# Patient Record
Sex: Female | Born: 1970 | Race: Black or African American | Hispanic: No | Marital: Single | Smoking: Never smoker
Health system: Southern US, Community
[De-identification: ages and names within clinical notes are randomized; demographics above are authoritative.]

## PROBLEM LIST (undated history)

## (undated) DIAGNOSIS — A411 Sepsis due to other specified staphylococcus: Secondary | ICD-10-CM

## (undated) DIAGNOSIS — K5900 Constipation, unspecified: Secondary | ICD-10-CM

## (undated) DIAGNOSIS — K219 Gastro-esophageal reflux disease without esophagitis: Secondary | ICD-10-CM

## (undated) DIAGNOSIS — F32A Depression, unspecified: Secondary | ICD-10-CM

## (undated) DIAGNOSIS — A8100 Creutzfeldt-Jakob disease, unspecified: Secondary | ICD-10-CM

## (undated) DIAGNOSIS — R130 Aphagia: Secondary | ICD-10-CM

## (undated) DIAGNOSIS — N390 Urinary tract infection, site not specified: Secondary | ICD-10-CM

## (undated) DIAGNOSIS — B952 Enterococcus as the cause of diseases classified elsewhere: Secondary | ICD-10-CM

## (undated) DIAGNOSIS — A419 Sepsis, unspecified organism: Secondary | ICD-10-CM

## (undated) DIAGNOSIS — R7881 Bacteremia: Secondary | ICD-10-CM

## (undated) DIAGNOSIS — J189 Pneumonia, unspecified organism: Secondary | ICD-10-CM

## (undated) DIAGNOSIS — F0390 Unspecified dementia without behavioral disturbance: Secondary | ICD-10-CM

## (undated) DIAGNOSIS — E538 Deficiency of other specified B group vitamins: Secondary | ICD-10-CM

## (undated) DIAGNOSIS — G2581 Restless legs syndrome: Secondary | ICD-10-CM

## (undated) DIAGNOSIS — F329 Major depressive disorder, single episode, unspecified: Secondary | ICD-10-CM

## (undated) HISTORY — PX: PEG TUBE PLACEMENT: SUR1034

## (undated) HISTORY — PX: CHOLECYSTECTOMY: SHX55

---

## 2013-02-07 ENCOUNTER — Encounter (HOSPITAL_COMMUNITY): Payer: Self-pay | Admitting: Emergency Medicine

## 2013-02-07 ENCOUNTER — Observation Stay (HOSPITAL_COMMUNITY)
Admission: EM | Admit: 2013-02-07 | Discharge: 2013-02-08 | Disposition: A | Discharge status: Skilled Nursing Facility | Payer: Medicare Other | Attending: Internal Medicine | Admitting: Internal Medicine

## 2013-02-07 DIAGNOSIS — F329 Major depressive disorder, single episode, unspecified: Secondary | ICD-10-CM | POA: Insufficient documentation

## 2013-02-07 DIAGNOSIS — G2581 Restless legs syndrome: Secondary | ICD-10-CM | POA: Insufficient documentation

## 2013-02-07 DIAGNOSIS — F3289 Other specified depressive episodes: Secondary | ICD-10-CM | POA: Insufficient documentation

## 2013-02-07 DIAGNOSIS — Z431 Encounter for attention to gastrostomy: Principal | ICD-10-CM | POA: Insufficient documentation

## 2013-02-07 DIAGNOSIS — F0280 Dementia in other diseases classified elsewhere without behavioral disturbance: Secondary | ICD-10-CM | POA: Diagnosis present

## 2013-02-07 DIAGNOSIS — R4789 Other speech disturbances: Secondary | ICD-10-CM

## 2013-02-07 DIAGNOSIS — A8100 Creutzfeldt-Jakob disease, unspecified: Secondary | ICD-10-CM | POA: Insufficient documentation

## 2013-02-07 DIAGNOSIS — F0390 Unspecified dementia without behavioral disturbance: Secondary | ICD-10-CM

## 2013-02-07 DIAGNOSIS — K219 Gastro-esophageal reflux disease without esophagitis: Secondary | ICD-10-CM | POA: Insufficient documentation

## 2013-02-07 DIAGNOSIS — K5900 Constipation, unspecified: Secondary | ICD-10-CM | POA: Diagnosis present

## 2013-02-07 DIAGNOSIS — N390 Urinary tract infection, site not specified: Secondary | ICD-10-CM | POA: Insufficient documentation

## 2013-02-07 DIAGNOSIS — R4702 Dysphasia: Secondary | ICD-10-CM | POA: Diagnosis present

## 2013-02-07 HISTORY — DX: Gastro-esophageal reflux disease without esophagitis: K21.9

## 2013-02-07 HISTORY — DX: Bacteremia: R78.81

## 2013-02-07 HISTORY — DX: Pneumonia, unspecified organism: J18.9

## 2013-02-07 HISTORY — DX: Restless legs syndrome: G25.81

## 2013-02-07 HISTORY — DX: Unspecified dementia, unspecified severity, without behavioral disturbance, psychotic disturbance, mood disturbance, and anxiety: F03.90

## 2013-02-07 HISTORY — DX: Aphagia: R13.0

## 2013-02-07 HISTORY — DX: Deficiency of other specified B group vitamins: E53.8

## 2013-02-07 HISTORY — DX: Major depressive disorder, single episode, unspecified: F32.9

## 2013-02-07 HISTORY — DX: Urinary tract infection, site not specified: N39.0

## 2013-02-07 HISTORY — DX: Constipation, unspecified: K59.00

## 2013-02-07 HISTORY — DX: Creutzfeldt-Jakob disease, unspecified: A81.00

## 2013-02-07 HISTORY — DX: Depression, unspecified: F32.A

## 2013-02-07 HISTORY — DX: Sepsis, unspecified organism: A41.9

## 2013-02-07 LAB — CBC
HCT: 38.7 % (ref 36.0–46.0)
Hemoglobin: 12.7 g/dL (ref 12.0–15.0)
MCH: 27.7 pg (ref 26.0–34.0)
MCV: 84.3 fL (ref 78.0–100.0)
RBC: 4.59 MIL/uL (ref 3.87–5.11)
RDW: 15.6 % — ABNORMAL HIGH (ref 11.5–15.5)
WBC: 8.9 10*3/uL (ref 4.0–10.5)

## 2013-02-07 LAB — BASIC METABOLIC PANEL
CO2: 27 mEq/L (ref 19–32)
Chloride: 99 mEq/L (ref 96–112)
Creatinine, Ser: 0.66 mg/dL (ref 0.50–1.10)
GFR calc Af Amer: >90 mL/min (ref >90)
GFR calc non Af Amer: >90 mL/min (ref >90)
Glucose, Bld: 103 mg/dL — ABNORMAL HIGH (ref 70–99)
Potassium: 3.8 mEq/L (ref 3.5–5.1)
Sodium: 137 mEq/L (ref 135–145)

## 2013-02-07 MED ORDER — SODIUM CHLORIDE 0.9 % IV SOLN
INTRAVENOUS | Status: AC
  Administered 2013-02-07: 07:00:00 via INTRAVENOUS

## 2013-02-07 MED ORDER — BISACODYL 10 MG RE SUPP: 10.0000 mg | RECTAL | Status: DC | PRN

## 2013-02-07 MED ORDER — ACETAMINOPHEN 650 MG RE SUPP: 650.0000 mg | Freq: Four times a day (QID) | RECTAL | Status: DC | PRN

## 2013-02-07 MED ORDER — ACETAMINOPHEN 325 MG PO TABS: 650.0000 mg | ORAL_TABLET | Freq: Four times a day (QID) | ORAL | Status: DC | PRN

## 2013-02-07 MED ORDER — ONDANSETRON HCL 4 MG/2ML IJ SOLN: 4.0000 mg | Freq: Four times a day (QID) | INTRAMUSCULAR | Status: DC | PRN

## 2013-02-07 MED ORDER — ONDANSETRON HCL 4 MG PO TABS: 4.0000 mg | ORAL_TABLET | Freq: Four times a day (QID) | ORAL | Status: DC | PRN

## 2013-02-07 MED ORDER — SODIUM CHLORIDE 0.9 % IV SOLN
INTRAVENOUS | Status: DC
  Administered 2013-02-07: 17:00:00 via INTRAVENOUS

## 2013-02-07 NOTE — Progress Notes (Signed)
Patient seen and examined today. Agree with assessment and plan. Contacted the interventional radiology for PEG tube replacement. Will have nurse place a Foley catheter so the site will remain patent. Will likely discharge on 02/08/2013 once PEG tube has been replaced.

## 2013-02-07 NOTE — ED Notes (Signed)
Feeding tube dislodged. The bulb was still inflated. No feeding happening at that time.  Coming from [employer]. BP 111/77, hr 84, rr 18

## 2013-02-07 NOTE — Progress Notes (Signed)
Patient ID: [name] [name], female   DOB: 03/01/1971, 42 y.o.   MRN: [id number]   Admitted to [facility] through ER last pm Gastric tube was dislodged while at nursing facility  G tube was placed in outside facility ?? When  Foley in place now in site. Will replace in am  Call to sister for consent--LM

## 2013-02-07 NOTE — ED Notes (Signed)
Skin dry and intact around peg tube site. No purulent drainage.

## 2013-02-07 NOTE — ED Notes (Signed)
Dr. Dierdre Highman attempted to place 20 fr. g-tube but unsuccessful.

## 2013-02-07 NOTE — ED Provider Notes (Signed)
CSN: [id number]     Arrival date & time 02/07/13  0221 History   First MD Initiated Contact with Patient 02/07/13 0335     Chief Complaint  Patient presents with  . GI Problem   (Consider location/radiation/quality/duration/timing/severity/associated sxs/prior Treatment) HPI History per EMS and nursing home report. History of Crutzfeldt-Jakob disease, is aphasic/ bed bound with PEG tube.  Nursing home reports that her 16 DF PEG tube was dislodged sometime today with balloon inflated.  She was sent here for further evaluation. Patient unable to provide any history but to chronic disease - level V caveat applies. Past Medical History  Diagnosis Date  . Dementia   . Restless leg syndrome   . Esophageal reflux   . UTI (urinary tract infection)   . Depression   . Pneumonia   . Constipation   . Aphagia   . Bacteremia   . Sepsis    Past Surgical History  Procedure Laterality Date  . Peg tube placement      16 French   No family history on file. History  Substance Use Topics  . Smoking status: Unknown If Ever Smoked  . Smokeless tobacco: Not on file  . Alcohol Use: No   OB History   Grav Para Term Preterm Abortions TAB SAB Ect Mult Living                 Review of Systems  Unable to perform ROS  level V caveat as above  Allergies  Sulfa antibiotics  Home Medications   Current Outpatient Rx  Name  Route  Sig  Dispense  Refill  . bisacodyl (DULCOLAX) 10 MG suppository   Rectal   Place 10 mg rectally every other day as needed for moderate constipation.         . cetirizine (ZYRTEC) 10 MG tablet   Tube   Give 10 mg by tube daily.         . meloxicam (MOBIC) 7.5 MG tablet   Oral   Take 7.5 mg by mouth daily.         . Nutritional Supplements (FEEDING SUPPLEMENT, JEVITY 1.5 CAL,) LIQD   Per Tube   Place 45 mL/hr into feeding tube continuous.         Marland Kitchen omeprazole (PRILOSEC) 2 mg/mL SUSP   Per Tube   Place 20 mg into feeding tube daily.         .  polyethylene glycol (MIRALAX / GLYCOLAX) packet   Tube   Give 17 g by tube daily.         Marland Kitchen rOPINIRole (REQUIP) 0.25 MG tablet   Tube   Give 0.25 mg by tube at bedtime.         . vitamin B-12 (CYANOCOBALAMIN) 100 MCG tablet   Tube   Give 100 mcg by tube daily.         . Water For Irrigation, Sterile (FREE WATER) SOLN   Per Tube   Place 150 mLs into feeding tube every 4 (four) hours.          BP 116/92  Pulse 88  Temp(Src) 99 F (37.2 C) (Rectal)  Resp 16  SpO2 97% Physical Exam  Constitutional: She appears well-developed and well-nourished.  HENT:  Head: Normocephalic and atraumatic.  Eyes: EOM are normal. Pupils are equal, round, and reactive to light.  Neck: Neck supple.  Cardiovascular: Normal rate, regular rhythm and intact distal pulses.   Pulmonary/Chest: Effort normal and breath sounds normal. No respiratory  distress.  Abdominal: Soft. She exhibits no distension.  G-tube site without active bleeding  Musculoskeletal: Normal range of motion. She exhibits no edema.  Neurological:  Awake, eyes open, does not follow commands, aphasic, contractures  Skin: Skin is warm and dry.    ED Course  Procedures (including critical care time) Labs Review Labs Reviewed  CBC - Abnormal; Notable for the following:    RDW 15.6 (*)    All other components within normal limits  BASIC METABOLIC PANEL   Medical supply does not have 16 French PEG available at this time  G-tube site evaluated and no obvious tract. I discussed with general surgery Dr. Derrell Lolling in best option at this point would be G-tube placement in the morning with interventional radiology. Medicine consulted for admit.   DR Adela Glimpse to admit MDM  Diagnosis: G-tube dislodged  Labs reviewed as above MED admit, plan IR PEG replacement   Sunnie Nielsen, MD 02/07/13 [phone]

## 2013-02-07 NOTE — H&P (Signed)
PCP:   [name], MD    Chief Complaint:  PEG tube fell out  HPI: Taylyn Brame is a 42 y.o. female   has a past medical history of Dementia; Restless leg syndrome; Esophageal reflux; UTI (urinary tract infection); Depression; Pneumonia; Constipation; Aphagia; Bacteremia; and Sepsis.   Presented with  Patient was brought in from nursing home after her PEG tube has become dislodged. 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 Unable to obtain any history from patient  Review of Systems:  Unable to obtain Past Medical History: Past Medical History  Diagnosis Date  . Dementia   . Restless leg syndrome   . Esophageal reflux   . UTI (urinary tract infection)   . Depression   . Pneumonia   . Constipation   . Aphagia   . Bacteremia   . Sepsis    Past Surgical History  Procedure Laterality Date  . Peg tube placement      16 French     Medications: Prior to Admission medications   Medication Sig Start Date End Date Taking? Authorizing Provider  bisacodyl (DULCOLAX) 10 MG suppository Place 10 mg rectally every other day as needed for moderate constipation.   Yes Historical Provider, MD  cetirizine (ZYRTEC) 10 MG tablet Give 10 mg by tube daily.   Yes Historical Provider, MD  meloxicam (MOBIC) 7.5 MG tablet Take 7.5 mg by mouth daily.   Yes Historical Provider, MD  Nutritional Supplements (FEEDING SUPPLEMENT, JEVITY 1.5 CAL,) LIQD Place 45 mL/hr into feeding tube continuous.   Yes Historical Provider, MD  omeprazole (PRILOSEC) 2 mg/mL SUSP Place 20 mg into feeding tube daily.   Yes Historical Provider, MD  polyethylene glycol (MIRALAX / GLYCOLAX) packet Give 17 g by tube daily.   Yes Historical Provider, MD  rOPINIRole (REQUIP) 0.25 MG tablet Give 0.25 mg by tube at bedtime.   Yes Historical Provider, MD  vitamin B-12 (CYANOCOBALAMIN) 100 MCG tablet Give 100 mcg by tube daily.   Yes Historical Provider, MD  Water  For Irrigation, Sterile (FREE WATER) SOLN Place 150 mLs into feeding tube every 4 (four) hours.   Yes Historical Provider, MD    Allergies:   Allergies  Allergen Reactions  . Sulfa Antibiotics Other (See Comments)    unknown    Social History:  Ambulatory bed bound Lives at [facility]   reports that she does not drink alcohol or use illicit drugs.   Family History: Family history is unknown by patient. patient is unable to provide secondary to dementia    Physical Exam: Patient Vitals for the past 24 hrs:  BP Temp Temp src Pulse Resp SpO2  02/07/13 0300 116/92 mmHg - - 88 - 97 %  02/07/13 0256 - 99 F (37.2 C) Rectal - 16 -  02/07/13 0237 114/76 mmHg - - 86 - 97 %    1. General:  in No Acute distress 2. Psychological: Somnolent not responding  to verbal stimuli moaning 3. Head/ENT:   Moist Mucous Membranes                          Head Non traumatic, neck supple                          Normal Dentition 4. SKIN: normal  Skin turgor,  Skin clean Dry and intact no rash 5. Heart:  Regular rate and rhythm no Murmur, Rub or gallop 6. Lungs: Clear to auscultation bilaterally, no wheezes or crackles   7. Abdomen: Soft, non-tender, Non distended PEG tube site visible appears to be well with extravasating material 8. Lower extremities: no clubbing, cyanosis, or edema 9. Neurologically Grossly intact, moving all 4 extremities equally 10. MSK: Normal range of motion  body mass index is unknown because there is no height or weight on file.   Labs on Admission:   Recent Labs  02/07/13 0400  NA 137  K 3.8  CL 99  CO2 27  GLUCOSE 103*  BUN 14  CREATININE 0.66  CALCIUM 9.3   No results found for this basename: AST, ALT, ALKPHOS, BILITOT, PROT, ALBUMIN,  in the last 72 hours No results found for this basename: LIPASE, AMYLASE,  in the last 72 hours  Recent Labs  02/07/13 0400  WBC 8.9  HGB 12.7  HCT 38.7  MCV 84.3  PLT 326   No results found for  this basename: CKTOTAL, CKMB, CKMBINDEX, TROPONINI,  in the last 72 hours No results found for this basename: TSH, T4TOTAL, FREET3, T3FREE, THYROIDAB,  in the last 72 hours No results found for this basename: VITAMINB12, FOLATE, FERRITIN, TIBC, IRON, RETICCTPCT,  in the last 72 hours No results found for this basename: HGBA1C    CrCl is unknown because there is no height on file for the current visit. ABG No results found for this basename: phart, pco2, po2, hco3, tco2, acidbasedef, o2sat     No results found for this basename: DDIMER     Cultures: No results found for this basename: sdes, specrequest, cult, reptstatus       Radiological Exams on Admission: No results found.  Chart has been reviewed  Assessment/Plan  42 year old female with severe dementia and aphasia here after her PEG tube has been dislodged would need a new PEG tube placement  Present on Admission:  . [name] disease - patient is bed bound,  lives at nursing home  . Dysphasia - will need a replacement PEG tube placed in the near future. In a.m. would need to consult IR for placement patient has been discussed with surgery and at this point they feel like IR is a better option  . Constipation - once able to take medications would restart her MiraLAX and bowel regimen   Prophylaxis: SCD  CODE STATUS: Presumed to be full code will need to be confirmed  Other plan as per orders.  I have spent a total of 40 min on this admission  Danea Manter 02/07/2013, 5:33 AM

## 2013-02-08 ENCOUNTER — Observation Stay (HOSPITAL_COMMUNITY): Payer: Medicare Other

## 2013-02-08 LAB — CBC
HCT: 39.3 % (ref 36.0–46.0)
MCH: 27.8 pg (ref 26.0–34.0)
MCHC: 32.6 g/dL (ref 30.0–36.0)
MCV: 85.4 fL (ref 78.0–100.0)
Platelets: 336 10*3/uL (ref 150–400)
RBC: 4.6 MIL/uL (ref 3.87–5.11)
RDW: 15.7 % — ABNORMAL HIGH (ref 11.5–15.5)

## 2013-02-08 LAB — COMPREHENSIVE METABOLIC PANEL
ALT: 16 U/L (ref 0–35)
AST: 20 U/L (ref 0–37)
Albumin: 3.3 g/dL — ABNORMAL LOW (ref 3.5–5.2)
Alkaline Phosphatase: 119 U/L — ABNORMAL HIGH (ref 39–117)
CO2: 24 mEq/L (ref 19–32)
Chloride: 106 mEq/L (ref 96–112)
GFR calc non Af Amer: >90 mL/min (ref >90)
Sodium: 140 mEq/L (ref 135–145)
Total Bilirubin: 0.8 mg/dL (ref 0.3–1.2)

## 2013-02-08 LAB — TSH: TSH: 1.855 u[IU]/mL (ref 0.350–4.500)

## 2013-02-08 LAB — PHOSPHORUS: Phosphorus: 3.6 mg/dL (ref 2.3–4.6)

## 2013-02-08 MED ORDER — IOHEXOL 300 MG/ML  SOLN
50.0000 mL | Freq: Once | INTRAMUSCULAR | Status: AC | PRN
  Administered 2013-02-08: 10 mL

## 2013-02-08 NOTE — Progress Notes (Signed)
Pt discharged in stable condition via EMS to [facility].  Discharge packet sent with EMS.  [place]

## 2013-02-08 NOTE — Clinical Social Work Psychosocial (Signed)
Clinical Social Work Department BRIEF PSYCHOSOCIAL ASSESSMENT 02/08/2013  Patient:  [name] [name],[name] [name]     Account Number:  [id number]     Admit date:  02/07/2013  Clinical Social Worker:  Sherre Lain  Date/Time:  02/08/2013 01:20 PM  Referred by:  Physician  Date Referred:  02/08/2013 Referred for  SNF Placement   Other Referral:   none.   Interview type:  Family Other interview type:   Pt has dementia and is unable to be assessed.    PSYCHOSOCIAL DATA Living Status:  FACILITY Admitted from facility:  [facility] Level of care:  Skilled Nursing Facility Primary support name:  [name] [name] Primary support relationship to patient:  SIBLING Degree of support available:   Adequate support system. Pt's sister was aware that pt was in the hospital and was asking how pt was doing.    CURRENT CONCERNS Current Concerns  None Noted   Other Concerns:   none.    SOCIAL WORK ASSESSMENT / PLAN CSW spoke to pt's sister, [name] [name], regarding pt returning to [facility]. Pt's sister stated that family would like for pt to return to [facility]. CSW contacted [facility] to confirm pt's return to [facility] on 02/08/2013. CSW to facilitate [facility] transportation for pt to return to [facility].   Assessment/plan status:  Psychosocial Support/Ongoing Assessment of Needs Other assessment/ plan:   none.   Information/referral to community resources:   Pt returning to previous SNF.    PATIENTS/FAMILYS RESPONSE TO PLAN OF CARE: Pt's family was agreeable and understanding of CSW plan of care.       Darlyn Chamber, LCSWA Clinical Social Worker [phone]

## 2013-02-08 NOTE — Progress Notes (Signed)
Pt left great toe and second and third toes were with green drainage from around nailbed. No odor noted. Wound cleaned with normal saline some bleeding noted. Wound open to air.

## 2013-02-08 NOTE — Procedures (Signed)
Successful replacement of gastrostomy tube. New 18Fr balloon retention placed without difficulty. Imaging with contrast injection confirms position in gastric lumen. No complications.  Brayton El PA-C Interventional Radiology 02/08/2013 10:23 AM

## 2013-02-08 NOTE — Discharge Summary (Signed)
Physician Discharge Summary  [name] [name] ZOX:[id number] DOB: 02/19/1971 DOA: 02/07/2013  PCP: [name], MD  Admit date: 02/07/2013 Discharge date: 02/08/2013  Time spent: 30 minutes  Recommendations for Outpatient Follow-up:  Patient will need followup with nursing home physician as well as her own primary care physician within one week of discharge. Her PEG tube was replaced by interventional radiology. She should continue her medications as prescribed.  Discharge Diagnoses:  Active Problems:   Dementia   Creutzfeldt Harlene Salts disease   Dysphasia   Constipation   Discharge Condition: Stable  Diet recommendation: Resume diet at Nursing home through peg tube  There were no vitals filed for this visit.  History of present illness:  [name] [name] is a 42 y.o. female has a past medical history of Dementia; Restless leg syndrome; Esophageal reflux; UTI (urinary tract infection); Depression; Pneumonia; Constipation; Aphagia; Bacteremia; and Sepsis.  Presented with Patient was brought in from nursing home after her PEG tube has become dislodged. 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 At time of presentation, patient did not have PEG tube in place. Bandages noted over PEG tube site. Insertion of Foley was conducted to keep opening patent. Interventional radiology was contacted for placement of PEG tube, which was done successfully. Patient will be discharged back to the nursing home. Medication should be continued as prescribed. Patient should follow up with physician at the nursing home. As well as her primary care physician.  Procedures: Replacement of gastrostomy tube by  IR  Consultations: Interventional radiology  Discharge Exam: Filed Vitals:   02/08/13 1041  BP: 126/87  Pulse: 93  Temp: 98.7 F (37.1 C)  Resp: 17     General: Well developed, well nourished, NAD, appears stated age  HEENT: NCAT, PERRLA, EOMI, Anicteic Sclera, mucous membranes moist.   Neck: Supple, no JVD, no masses  Cardiovascular: S1 S2 auscultated, no rubs, murmurs or gallops. Regular rate and rhythm.  Respiratory: Clear to auscultation bilaterally with equal chest rise  Abdomen: Soft, nontender, nondistended, + bowel sounds, peg tube in place  Extremities: warm dry without cyanosis clubbing or edema  Neuro: Awake.  Does not speak, or respond to commands.   Skin: Without rashes exudates or nodules  Discharge Instructions      Discharge Orders   Future Orders Complete By Expires   Discharge instructions  As directed    Comments:     Patient will need followup with nursing home physician as well as her own primary care physician within one week of discharge. Her PEG tube was replaced by interventional radiology. She should continue her medications as prescribed.       Medication List         bisacodyl 10 MG suppository  Commonly known as:  DULCOLAX  Place 10 mg rectally every other day as needed for moderate constipation.     cetirizine 10 MG tablet  Commonly known as:  ZYRTEC  Give 10 mg by tube daily.     feeding supplement (JEVITY 1.5 CAL) Liqd  Place 45 mL/hr into feeding tube continuous.     free water Soln  Place 150 mLs into feeding tube every 4 (four) hours.     meloxicam 7.5 MG tablet  Commonly known as:  MOBIC  Take 7.5 mg by mouth daily.     omeprazole 2 mg/mL Susp  Commonly known as:  PRILOSEC  Place 20 mg into feeding tube daily.     polyethylene glycol packet  Commonly known as:  MIRALAX / GLYCOLAX  Give 17 g by tube daily.     rOPINIRole 0.25 MG tablet  Commonly known as:  REQUIP  Give 0.25 mg by tube at bedtime.      vitamin B-12 100 MCG tablet  Commonly known as:  CYANOCOBALAMIN  Give 100 mcg by tube daily.       Allergies  Allergen Reactions  . Sulfa Antibiotics Other (See Comments)    unknown   Follow-up Information   Follow up with AMIN, SAAD, MD. Schedule an appointment as soon as possible for a visit in 1 week.   Specialty:  Internal Medicine   Contact information:   [address] [place] [address] [phone]        The results of significant diagnostics from this hospitalization (including imaging, microbiology, ancillary and laboratory) are listed below for reference.    Significant Diagnostic Studies: Ir Replc Gastro/colonic Tube Percut W/fluoro  02/08/2013   CLINICAL DATA:  Dislodged gastrostomy tube.  Request replacement  EXAM: GASTROSTOMY CATHETER REPLACEMENT  CONTRAST:  10mL OMNIPAQUE IOHEXOL 300 MG/ML  SOLN  PROCEDURE: A 14 French Foley catheter is identified in the left upper quadrant currently serving the purpose as gastrostomy tube. The balloon was deflated and the Foley was removed. A new 18 French balloon retention gastrostomy tube was placed without difficulty. The balloon was inflated with approximately 8 cc of sterile saline. Contrast injection under fluoroscopy demonstrated appropriate position in the gastric lumen. Catheter was then flushed with sterile saline. The bumper was secured. Patient tolerated procedure well, no complications  IMPRESSION: Successful replacement of balloon retention gastrostomy tube as described above.  Read by: Brayton El PA-C   Electronically Signed   By: Irish Lack M.D.   On: 02/08/2013 10:28    Microbiology: No results found for this or any previous visit (from the past 240 hour(s)).   Labs: Basic Metabolic Panel:  Recent Labs Lab 02/07/13 0400 02/08/13 0628  NA 137 140  K 3.8 4.1  CL 99 106  CO2 27 24  GLUCOSE 103* 89  BUN 14 9  CREATININE 0.66 0.61  CALCIUM 9.3 9.4  MG  --  2.2  PHOS  --  3.6   Liver  Function Tests:  Recent Labs Lab 02/08/13 0628  AST 20  ALT 16  ALKPHOS 119*  BILITOT 0.8  PROT 8.1  ALBUMIN 3.3*   No results found for this basename: LIPASE, AMYLASE,  in the last 168 hours No results found for this basename: AMMONIA,  in the last 168 hours CBC:  Recent Labs Lab 02/07/13 0400 02/08/13 0628  WBC 8.9 9.8  HGB 12.7 12.8  HCT 38.7 39.3  MCV 84.3 85.4  PLT 326 336   Cardiac Enzymes: No results found for this basename: CKTOTAL, CKMB, CKMBINDEX, TROPONINI,  in the last 168 hours BNP: BNP (last 3 results) No results found for this basename: PROBNP,  in the last 8760 hours CBG: No results found for this basename: GLUCAP,  in the last 168 hours     Signed:  Edsel Petrin  [facility] 02/08/2013, 11:33 AM

## 2013-03-12 ENCOUNTER — Encounter (HOSPITAL_COMMUNITY): Payer: Self-pay | Admitting: Emergency Medicine

## 2013-03-12 ENCOUNTER — Inpatient Hospital Stay (HOSPITAL_COMMUNITY)
Admission: EM | Admit: 2013-03-12 | Discharge: 2013-03-15 | DRG: 194 | Disposition: A | Discharge status: Skilled Nursing Facility | Payer: Medicare Other | Attending: Internal Medicine | Admitting: Internal Medicine

## 2013-03-12 ENCOUNTER — Emergency Department (HOSPITAL_COMMUNITY): Payer: Medicare Other

## 2013-03-12 ENCOUNTER — Inpatient Hospital Stay (HOSPITAL_COMMUNITY): Payer: Medicare Other

## 2013-03-12 DIAGNOSIS — F3289 Other specified depressive episodes: Secondary | ICD-10-CM | POA: Diagnosis present

## 2013-03-12 DIAGNOSIS — G3109 Other frontotemporal dementia: Secondary | ICD-10-CM

## 2013-03-12 DIAGNOSIS — D72829 Elevated white blood cell count, unspecified: Secondary | ICD-10-CM | POA: Diagnosis present

## 2013-03-12 DIAGNOSIS — E875 Hyperkalemia: Secondary | ICD-10-CM

## 2013-03-12 DIAGNOSIS — Z7401 Bed confinement status: Secondary | ICD-10-CM

## 2013-03-12 DIAGNOSIS — Z79899 Other long term (current) drug therapy: Secondary | ICD-10-CM

## 2013-03-12 DIAGNOSIS — J159 Unspecified bacterial pneumonia: Principal | ICD-10-CM

## 2013-03-12 DIAGNOSIS — G2581 Restless legs syndrome: Secondary | ICD-10-CM | POA: Diagnosis present

## 2013-03-12 DIAGNOSIS — F0280 Dementia in other diseases classified elsewhere without behavioral disturbance: Secondary | ICD-10-CM | POA: Diagnosis present

## 2013-03-12 DIAGNOSIS — J189 Pneumonia, unspecified organism: Secondary | ICD-10-CM | POA: Diagnosis present

## 2013-03-12 DIAGNOSIS — R1310 Dysphagia, unspecified: Secondary | ICD-10-CM | POA: Diagnosis present

## 2013-03-12 DIAGNOSIS — F0390 Unspecified dementia without behavioral disturbance: Secondary | ICD-10-CM

## 2013-03-12 DIAGNOSIS — F329 Major depressive disorder, single episode, unspecified: Secondary | ICD-10-CM | POA: Diagnosis present

## 2013-03-12 DIAGNOSIS — K219 Gastro-esophageal reflux disease without esophagitis: Secondary | ICD-10-CM | POA: Diagnosis present

## 2013-03-12 DIAGNOSIS — R4701 Aphasia: Secondary | ICD-10-CM | POA: Diagnosis present

## 2013-03-12 DIAGNOSIS — K5900 Constipation, unspecified: Secondary | ICD-10-CM

## 2013-03-12 DIAGNOSIS — R6510 Systemic inflammatory response syndrome (SIRS) of non-infectious origin without acute organ dysfunction: Secondary | ICD-10-CM

## 2013-03-12 DIAGNOSIS — A8100 Creutzfeldt-Jakob disease, unspecified: Secondary | ICD-10-CM

## 2013-03-12 DIAGNOSIS — R4789 Other speech disturbances: Secondary | ICD-10-CM

## 2013-03-12 DIAGNOSIS — R4702 Dysphasia: Secondary | ICD-10-CM

## 2013-03-12 LAB — CBC
HCT: 39.4 % (ref 36.0–46.0)
Hemoglobin: 13.1 g/dL (ref 12.0–15.0)
MCH: 28.4 pg (ref 26.0–34.0)
MCHC: 33.2 g/dL (ref 30.0–36.0)
MCV: 85.3 fL (ref 78.0–100.0)
Platelets: 327 10*3/uL (ref 150–400)
RBC: 4.62 MIL/uL (ref 3.87–5.11)
RDW: 16.6 % — ABNORMAL HIGH (ref 11.5–15.5)
WBC: 12.2 10*3/uL — ABNORMAL HIGH (ref 4.0–10.5)

## 2013-03-12 LAB — COMPREHENSIVE METABOLIC PANEL
ALT: 27 U/L (ref 0–35)
AST: 63 U/L — ABNORMAL HIGH (ref 0–37)
Albumin: 3.5 g/dL (ref 3.5–5.2)
Alkaline Phosphatase: 102 U/L (ref 39–117)
BUN: 14 mg/dL (ref 6–23)
CO2: 26 mEq/L (ref 19–32)
Calcium: 9 mg/dL (ref 8.4–10.5)
Chloride: 102 mEq/L (ref 96–112)
Creatinine, Ser: 0.66 mg/dL (ref 0.50–1.10)
GFR calc Af Amer: >90 mL/min (ref >90)
GFR calc non Af Amer: >90 mL/min (ref >90)
Glucose, Bld: 102 mg/dL — ABNORMAL HIGH (ref 70–99)
Potassium: 5.7 mEq/L — ABNORMAL HIGH (ref 3.7–5.3)
Sodium: 142 mEq/L (ref 137–147)
Total Bilirubin: 0.9 mg/dL (ref 0.3–1.2)
Total Protein: 8.4 g/dL — ABNORMAL HIGH (ref 6.0–8.3)

## 2013-03-12 MED ORDER — SODIUM POLYSTYRENE SULFONATE 15 GM/60ML PO SUSP
30.0000 g | Freq: Once | ORAL | Status: DC
  Filled 2013-03-12: qty 120

## 2013-03-12 MED ORDER — BENZETHONIUM CL & ZINC OXIDE 0.13 & 30.6 % EX KIT: 1.0000 "application " | PACK | Freq: Three times a day (TID) | CUTANEOUS | Status: DC | PRN

## 2013-03-12 MED ORDER — FREE WATER
150.0000 mL | Status: DC
  Administered 2013-03-13 – 2013-03-15 (×14): 150 mL

## 2013-03-12 MED ORDER — ACETAMINOPHEN 160 MG/5ML PO SOLN: 650.0000 mg | Freq: Four times a day (QID) | ORAL | Status: DC | PRN

## 2013-03-12 MED ORDER — OSELTAMIVIR PHOSPHATE 75 MG PO CAPS
75.0000 mg | ORAL_CAPSULE | Freq: Two times a day (BID) | ORAL | Status: DC
  Administered 2013-03-13 (×2): 75 mg via ORAL
  Filled 2013-03-12 (×3): qty 1

## 2013-03-12 MED ORDER — ALBUTEROL SULFATE (2.5 MG/3ML) 0.083% IN NEBU
2.5000 mg | INHALATION_SOLUTION | RESPIRATORY_TRACT | Status: DC
  Administered 2013-03-13 – 2013-03-14 (×8): 2.5 mg via RESPIRATORY_TRACT
  Filled 2013-03-12 (×8): qty 3

## 2013-03-12 MED ORDER — GUAIFENESIN 100 MG/5ML PO SOLN
20.0000 mL | Freq: Four times a day (QID) | ORAL | Status: DC | PRN
  Filled 2013-03-12: qty 20

## 2013-03-12 MED ORDER — ENOXAPARIN SODIUM 40 MG/0.4ML ~~LOC~~ SOLN
40.0000 mg | Freq: Every day | SUBCUTANEOUS | Status: DC
  Administered 2013-03-13 – 2013-03-15 (×3): 40 mg via SUBCUTANEOUS
  Filled 2013-03-12 (×3): qty 0.4

## 2013-03-12 MED ORDER — CLOTRIMAZOLE 1 % EX CREA
TOPICAL_CREAM | Freq: Two times a day (BID) | CUTANEOUS | Status: DC
  Administered 2013-03-13 – 2013-03-15 (×5): via TOPICAL
  Filled 2013-03-12: qty 15

## 2013-03-12 MED ORDER — UREA 40 % EX OINT
1.0000 | TOPICAL_OINTMENT | Freq: Two times a day (BID) | CUTANEOUS | Status: DC
Start: 2013-03-13 — End: 2013-03-13
  Filled 2013-03-12 (×19): qty 30

## 2013-03-12 MED ORDER — DEXTROSE 5 % IV SOLN
2.0000 g | Freq: Once | INTRAVENOUS | Status: DC
  Filled 2013-03-12: qty 2

## 2013-03-12 MED ORDER — POLYETHYLENE GLYCOL 3350 17 G PO PACK
17.0000 g | PACK | Freq: Every day | ORAL | Status: DC
  Administered 2013-03-13 – 2013-03-15 (×3): 17 g via JEJUNOSTOMY
  Filled 2013-03-12 (×3): qty 1

## 2013-03-12 MED ORDER — MICONAZOLE NITRATE 2 % EX CREA: 1.0000 "application " | TOPICAL_CREAM | Freq: Two times a day (BID) | CUTANEOUS | Status: DC

## 2013-03-12 MED ORDER — SODIUM CHLORIDE 0.9 % IV SOLN
INTRAVENOUS | Status: DC
Start: 2013-03-13 — End: 2013-03-14
  Administered 2013-03-13 – 2013-03-14 (×2): via INTRAVENOUS

## 2013-03-12 MED ORDER — ROPINIROLE HCL 0.25 MG PO TABS
0.2500 mg | ORAL_TABLET | Freq: Every day | ORAL | Status: DC
  Administered 2013-03-13 – 2013-03-14 (×3): 0.25 mg via JEJUNOSTOMY
  Filled 2013-03-12 (×4): qty 1

## 2013-03-12 MED ORDER — VANCOMYCIN HCL IN DEXTROSE 1-5 GM/200ML-% IV SOLN
1000.0000 mg | Freq: Once | INTRAVENOUS | Status: AC
  Administered 2013-03-12: 1000 mg via INTRAVENOUS
  Filled 2013-03-12: qty 200

## 2013-03-12 MED ORDER — VITAMIN B-12 1000 MCG PO TABS
1000.0000 ug | ORAL_TABLET | Freq: Every day | ORAL | Status: DC
  Administered 2013-03-13 – 2013-03-15 (×3): 1000 ug via JEJUNOSTOMY
  Filled 2013-03-12 (×3): qty 1

## 2013-03-12 MED ORDER — JEVITY 1.5 CAL PO LIQD
45.0000 mL/h | ORAL | Status: DC
  Administered 2013-03-13: 45 mL/h
  Filled 2013-03-12 (×4): qty 1000

## 2013-03-12 MED ORDER — LORATADINE 10 MG PO TABS
10.0000 mg | ORAL_TABLET | Freq: Every day | ORAL | Status: DC
  Administered 2013-03-13 – 2013-03-15 (×3): 10 mg via ORAL
  Filled 2013-03-12 (×3): qty 1

## 2013-03-12 MED ORDER — FUROSEMIDE 40 MG PO TABS
40.0000 mg | ORAL_TABLET | Freq: Every day | ORAL | Status: DC
  Administered 2013-03-13 – 2013-03-15 (×3): 40 mg
  Filled 2013-03-12 (×3): qty 1

## 2013-03-12 MED ORDER — DEXTROSE 5 % IV SOLN
1.0000 g | Freq: Three times a day (TID) | INTRAVENOUS | Status: DC
  Administered 2013-03-13 – 2013-03-15 (×8): 1 g via INTRAVENOUS
  Filled 2013-03-12 (×10): qty 1

## 2013-03-12 MED ORDER — MELOXICAM 7.5 MG PO TABS
7.5000 mg | ORAL_TABLET | Freq: Every day | ORAL | Status: DC
  Administered 2013-03-13 – 2013-03-15 (×3): 7.5 mg via ORAL
  Filled 2013-03-12 (×4): qty 1

## 2013-03-12 MED ORDER — BISACODYL 10 MG RE SUPP: 10.0000 mg | RECTAL | Status: DC | PRN

## 2013-03-12 MED ORDER — OMEPRAZOLE 2 MG/ML ORAL SUSPENSION: 20.0000 mg | Freq: Every day | ORAL | Status: DC

## 2013-03-12 MED ORDER — LEVOFLOXACIN IN D5W 750 MG/150ML IV SOLN
750.0000 mg | Freq: Once | INTRAVENOUS | Status: DC
  Administered 2013-03-12: 750 mg via INTRAVENOUS
  Filled 2013-03-12: qty 150

## 2013-03-12 NOTE — H&P (Signed)
[facility] History and Physical  [name] [name] KPV:[id number] DOB: [birth year] DOA: 03/12/2013  Referring physician: ED PCP: Garwin Brothers, MD   Chief Complaint:  Sent from SNF for shortness of breath x 1 day  History obtained from ED provider as patient is non verbal  HPI:  43 year old female with history of Creutzfeldt  Jakob disease, dementia, restless leg syndrome, depression, aphasia, dysphagia who was sent from skilled nursing facility for shortness of breath since today. History is limited as patient is nonverbal. Patient had the skilled nursing facility he was found to be short of breath and oxygen saturation dropped to 91% on room at which improved to 97% on 2 L when placed by EMS. Patient was noted to be tachypneic with a RR of 34 and coughing yellowish phlegm. Staff at the facility also reported that about 30 minutes after she was given 2 feeds she began to feel out in pain. No documented fever at the facility. No vomiting, chills, diarrhea or urinary symptoms or change in her mental status. She was admitted to the hospital one month back for dislodged tube which was replaced by IR.  Course in the ED Patient found to have a low-grade temperature of 99.56F, tachycardic to 109 and tachypnic to 40. O2 sat stable at 9 and percent on 2 L via nasal cannula. Blood work done showed leukocytosis with WBC of 12.2. Chemistry showed potassium of 5.7. A portable chest x-ray done showed decreased lung volumes with bilateral perihilar and medial basilar opacities possible for infiltrate. Patient given a dose of Levaquin. [facility] consulted for admission for pneumonia. Given recent hospitalization and she being a resident of skilled nursing facility patient admitted for management of healthcare associated pneumonia.   Review of Systems:  As outlined in history of present illness. Limited due to the patient's dementia and  aphasia   Past Medical History  Diagnosis Date  . Dementia    . Restless leg syndrome   . Esophageal reflux   . UTI (urinary tract infection)   . Depression   . Pneumonia   . Constipation   . Aphagia   . Bacteremia   . Sepsis   . Ivin Booty disease   . Vitamin B12 deficiency    Past Surgical History  Procedure Laterality Date  . Peg tube placement      16 [place]   Social History:  reports that she does not drink alcohol or use illicit drugs. Her tobacco history is not on file.  Allergies  Allergen Reactions  . Sulfa Antibiotics Other (See Comments)    ON MAR    History reviewed. No pertinent family history.  Prior to Admission medications   Medication Sig Start Date End Date Taking? Authorizing Provider  Benzethonium Cl & Zinc Oxide ([place] [facility] STARTER) 0.13 & 30.6 % KIT Apply 1 application topically 3 (three) times daily as needed (excoriation).   Yes Historical Provider, MD  bisacodyl (DULCOLAX) 10 MG suppository Place 10 mg rectally every other day as needed for moderate constipation.   Yes Historical Provider, MD  cetirizine (ZYRTEC) 10 MG tablet Give 10 mg by tube daily.   Yes Historical Provider, MD  guaiFENesin (ROBITUSSIN) 100 MG/5ML SOLN 20 mLs by Gastric Tube route every 6 (six) hours as needed (CONGESTION).   Yes Historical Provider, MD  meloxicam (MOBIC) 7.5 MG tablet Take 7.5 mg by mouth daily.   Yes Historical Provider, MD  miconazole (MICOTIN) 2 % cream Apply 1 application topically 2 (two) times  daily. Apply to bilateral toes/toenails for foot infection for 4 weeks.   Yes Historical Provider, MD  Nutritional Supplements (FEEDING SUPPLEMENT, JEVITY 1.5 CAL,) LIQD Place 45 mL/hr into feeding tube continuous.   Yes Historical Provider, MD  omeprazole (PRILOSEC) 2 mg/mL SUSP Place 20 mg into feeding tube daily.   Yes Historical Provider, MD  polyethylene glycol (MIRALAX / GLYCOLAX) packet Give 17 g by tube daily.   Yes Historical Provider, MD  rOPINIRole (REQUIP) 0.25 MG tablet 0.25 mg by PEG Tube route at  bedtime.    Yes Historical Provider, MD  urea (CARMOL) 40 % ointment Apply 1 application topically 2 (two) times daily. Apply to toenails on left foot 1st, 2nd, and 3rd digits.  Apply to right foot 1st and 2nd digits.   Yes Historical Provider, MD  vitamin B-12 (CYANOCOBALAMIN) 1000 MCG tablet 1,000 mcg by PEG Tube route daily.   Yes Historical Provider, MD  Water For Irrigation, Sterile (FREE WATER) SOLN Place 150 mLs into feeding tube every 4 (four) hours.   Yes Historical Provider, MD  furosemide (LASIX) 40 MG tablet 40 mg by Gastric Tube route once.    Historical Provider, MD    Physical Exam:  Filed Vitals:   03/12/13 2045 03/12/13 2130 03/12/13 2152 03/12/13 2200  BP: 128/102 112/82 112/82 110/81  Pulse: 98 96  98  Temp:      TempSrc:      Resp: 25 31 37   SpO2: 99% 97% 97% 97%    Constitutional: Vital signs reviewed.  middle aged female lying in bed  Appears tachypnic Neck: No pallor, no icterus, moist oral mucosa, no JVD Cardiovascular: Normal S1 and S2, no murmurs rub or gallop Pulmonary/Chest: Tachypnea, scattered rhonchi, no crackles Abdominal: Soft. Non-tender, non-distended, bowel sounds are normal, G-tube in place with no erythema but serous discharge around  the site.  extremities: Warm, no edema, dressing over bilateral toes CNS: Alert and awake, non oriented, non-verbal and does not respond to commands  Labs on Admission:  Basic Metabolic Panel:  Recent Labs Lab 03/12/13 1946  NA 142  K 5.7*  CL 102  CO2 26  GLUCOSE 102*  BUN 14  CREATININE 0.66  CALCIUM 9.0   Liver Function Tests:  Recent Labs Lab 03/12/13 1946  AST 63*  ALT 27  ALKPHOS 102  BILITOT 0.9  PROT 8.4*  ALBUMIN 3.5   No results found for this basename: LIPASE, AMYLASE,  in the last 168 hours No results found for this basename: AMMONIA,  in the last 168 hours CBC:  Recent Labs Lab 03/12/13 1944  WBC 12.2*  HGB 13.1  HCT 39.4  MCV 85.3  PLT 327   Cardiac Enzymes: No  results found for this basename: CKTOTAL, CKMB, CKMBINDEX, TROPONINI,  in the last 168 hours BNP: No components found with this basename: POCBNP,  CBG: No results found for this basename: GLUCAP,  in the last 168 hours  Radiological Exams on Admission: Dg Chest Portable 1 View  03/12/2013   CLINICAL DATA:  Shortness of breath, history of MN  EXAM: PORTABLE CHEST - 1 VIEW  COMPARISON:  None  FINDINGS: Grossly normal cardiac silhouette and mediastinal contours given decreased lung volumes and patient rotation. Bilateral perihilar and medial basilar heterogeneous possible airspace opacities. No definite pleural effusion pneumothorax. No evidence of edema. Grossly unchanged bones.  IMPRESSION: Decreased lung volumes with bilateral perihilar and medial basilar opacities - atelectasis versus infiltrate. Further evaluation with a PA and lateral chest radiograph  may be obtained as clinically indicated.   Electronically Signed   By: Sandi Mariscal M.D.   On: 03/12/2013 20:46     Assessment/Plan   Principal Problem:   HCAP (healthcare-associated pneumonia) Patient meets criteria for SIRS given tachypnea and tachycardia. Admit to telemetry  for HCAP. Patient received a dose of IV Levaquin in the ED. I will place her on empiric vancomycin and cefepime. Continue O2 via nasal cannula and monitor sat. I will place her on scheduled albuterol nebs Tylenol when necessary for fever. I will check for flu PCR and place on empiric tamiflu. Check a PA lateral view of the chest x-ray in the morning.  Active Problems:     Hyperkalemia Patient has hx of constipation. unclear when her last BM was. Will get abdominal x ray to rule out any obstruction,. Order kayexalate and monitor    Dysphasia  On JVD 1.5cal 45 mL per hour Resume tube feeds  Restless leg syndrome Continue requip  Dementia   [name]   DVT prophylaxis: Subcutaneous Lovenox    Code Status: Full code Family Communication: none  at Bedside Disposition Plan: Return to skilled nursing facility once improved.  Louellen Molder [facility] Pager: [phone]  If 7PM-7AM, please contact night-coverage [email or web address] Password [facility] 03/12/2013, 10:49 PM    Total time spent :70 minutes

## 2013-03-12 NOTE — ED Notes (Signed)
[facility] with c/o low O2 saturation with SOB and feeding tube pain.  Staff at SNF noted pt to be at 91% RA, EMS placed pt on 2L [place] wich brought pt up to 97%SpO2. Pt noted by EMS to have shallow/rapid breathing at approx 34breathes/minute and is secreting yellow sputum.  Staff also reported to EMS that after 30 minutes of a tube feed the pt would begin to yell out in pain.  Per EMS pt is nonverbal at baseline.

## 2013-03-12 NOTE — Progress Notes (Signed)
ANTIBIOTIC CONSULT NOTE - INITIAL  Pharmacy Consult for cefepime and vancomycin Indication: HCAP  Allergies  Allergen Reactions  . Sulfa Antibiotics Other (See Comments)    ON MAR    Patient Measurements:     Vital Signs: Temp: 99.8 F (37.7 C) (01/09 1906) Temp src: Rectal (01/09 1906) BP: 110/81 mmHg (01/09 2200) Pulse Rate: 98 (01/09 2200) Intake/Output from previous day:   Intake/Output from this shift:    Labs:  Recent Labs  03/12/13 1944 03/12/13 1946  WBC 12.2*  --   HGB 13.1  --   PLT 327  --   CREATININE  --  0.66   CrCl is unknown because there is no height on file for the current visit. No results found for this basename: VANCOTROUGH, VANCOPEAK, VANCORANDOM, GENTTROUGH, GENTPEAK, GENTRANDOM, TOBRATROUGH, TOBRAPEAK, TOBRARND, AMIKACINPEAK, AMIKACINTROU, AMIKACIN,  in the last 72 hours   Microbiology: No results found for this or any previous visit (from the past 720 hour(s)).  Medical History: Past Medical History  Diagnosis Date  . Dementia   . Restless leg syndrome   . Esophageal reflux   . UTI (urinary tract infection)   . Depression   . Pneumonia   . Constipation   . Aphagia   . Bacteremia   . Sepsis   . Lenise ArenaCreutzfeldt Jakob disease   . Vitamin B12 deficiency    Assessment: [age] YOF with Creutzfeld Jakob Disease who is a [facility] presents with low O2 sat with SOB and feeding tube pain. To start vancomycin and cefepime for suspected HCAP. WBC 12.2, currently afebrile. SCr 0.66. Already has received a dose of levofloxacin. Has a one-time dose of vancomycin 1000mg  IV ordered. There is currently no height or weight on file from the patient from this admission or previous admissions. Patient is nonverbal and no one else is in the room.  Goal of Therapy:  Vancomycin trough level 15-20 mcg/ml  Plan:  1. Continue vancomycin x1 as ordered 2. Cefepime 2g IV x1 3. Will enter additional antibiotic doses when height, weight,  and CrCl are known 4. Follow up renal function, clinical progression, c/s, LOT, trough as indicated  Santez Woodcox D. Oriana Horiuchi, PharmD, BCPS Clinical Pharmacist Pager: [phone] 03/12/2013 10:36 PM

## 2013-03-12 NOTE — ED Notes (Signed)
Pt new EKG given to Dr Loretha StaplerWofford. No old EKG

## 2013-03-12 NOTE — ED Notes (Signed)
Report given to Kuch RN 

## 2013-03-12 NOTE — ED Provider Notes (Signed)
CSN: [id number]     Arrival date & time 03/12/13  1853 History   First MD Initiated Contact with Patient 03/12/13 1918     Chief Complaint  Patient presents with  . Shortness of Breath  . Feeding Tube Problem    (Consider location/radiation/quality/duration/timing/severity/associated sxs/prior Treatment) HPI Pt is a 43yo female with hx of [name] Disease BIB EMS from [facility] c/o low O2 saturation associated with SOB and feeding tube pain. Pt is nonverbal at baseline.  Per triage note, staff at SNF noted pt to be at 91% RA which improved to 97% SpO2 after EMS placed 2L [place].  Pt noted by EMS to havve shallow/rapid breathing at 34 breathes per minute and secreting yellow sputum.  Staff at facility also report to EMS that after tube feed, pt began to yell out in pain.    Past Medical History  Diagnosis Date  . Dementia   . Restless leg syndrome   . Esophageal reflux   . UTI (urinary tract infection)   . Depression   . Pneumonia   . Constipation   . Aphagia   . Bacteremia   . Sepsis   . [name] disease   . Vitamin B12 deficiency    Past Surgical History  Procedure Laterality Date  . Peg tube placement      16 [place]   History reviewed. No pertinent family history. History  Substance Use Topics  . Smoking status: Unknown If Ever Smoked  . Smokeless tobacco: Not on file  . Alcohol Use: No   OB History   Grav Para Term Preterm Abortions TAB SAB Ect Mult Living                 Review of Systems  Unable to perform ROS: Patient nonverbal    Allergies  Sulfa antibiotics  Home Medications   No current outpatient prescriptions on file. BP 114/58  Pulse 116  Temp(Src) 98.7 F (37.1 C) (Axillary)  Resp 28  Ht 5\' 8"  (1.727 m)  Wt 138 lb 3.7 oz (62.7 kg)  BMI 21.02 kg/m2  SpO2 97% Physical Exam  Nursing note and vitals reviewed. Constitutional: She appears well-developed and well-nourished.  Pt appears chronically ill.  HENT:   Head: Normocephalic and atraumatic.  Eyes: Conjunctivae are normal. No scleral icterus.  Neck: Normal range of motion.  Cardiovascular: Normal rate, regular rhythm and normal heart sounds.   Pulmonary/Chest: Effort normal. No respiratory distress. She has wheezes. She has no rales. She exhibits no tenderness.  Diffuse wheezes, decreased breath sounds.  Abdominal: Soft. Bowel sounds are normal. She exhibits no distension and no mass. There is no tenderness. There is no rebound and no guarding.  Feeding tube in place in left abdomen. No apparent surrounding cellulitis.  Musculoskeletal: Normal range of motion.  Arms and legs contracted, chronic  Neurological: She is alert.  Skin: Skin is warm and dry.    ED Course  Procedures (including critical care time) Labs Review Labs Reviewed  CBC - Abnormal; Notable for the following:    WBC 12.2 (*)    RDW 16.6 (*)    All other components within normal limits  COMPREHENSIVE METABOLIC PANEL - Abnormal; Notable for the following:    Potassium 5.7 (*)    Glucose, Bld 102 (*)    Total Protein 8.4 (*)    AST 63 (*)    All other components within normal limits  CULTURE, BLOOD (ROUTINE X 2)  CULTURE, BLOOD (ROUTINE  X 2)  GRAM STAIN  MRSA PCR SCREENING  INFLUENZA PANEL BY PCR (TYPE A & B, H1N1)  BASIC METABOLIC PANEL  HIV ANTIBODY (ROUTINE TESTING)  LEGIONELLA ANTIGEN, URINE  STREP PNEUMONIAE URINARY ANTIGEN   Imaging Review Dg Chest Portable 1 View  03/12/2013   CLINICAL DATA:  Shortness of breath, history of MN  EXAM: PORTABLE CHEST - 1 VIEW  COMPARISON:  None  FINDINGS: Grossly normal cardiac silhouette and mediastinal contours given decreased lung volumes and patient rotation. Bilateral perihilar and medial basilar heterogeneous possible airspace opacities. No definite pleural effusion pneumothorax. No evidence of edema. Grossly unchanged bones.  IMPRESSION: Decreased lung volumes with bilateral perihilar and medial basilar opacities -  atelectasis versus infiltrate. Further evaluation with a PA and lateral chest radiograph may be obtained as clinically indicated.   Electronically Signed   By: Simonne ComeJohn  Watts M.D.   On: 03/12/2013 20:46    EKG Interpretation    Date/Time:    Ventricular Rate:    PR Interval:    QRS Duration:   QT Interval:    QTC Calculation:   R Axis:     Text Interpretation:              MDM   1. HCAP (healthcare-associated pneumonia)   2. Dysphasia   3. SIRS (systemic inflammatory response syndrome)   4. 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  Pt requiring 2L [place], pt not normally on oxygen.   CXR: decreased lung volumes with bilateral perihilar and medial basilar opacities-atelectasis vs infiltrate. Due to pt's vitals and general appearance, will tx as pneumonia and admit pt.    Discussed pt with Dr. Loretha StaplerWofford who agreed with plan.  Consulted with Dr. Gonzella Lexhungel, will admit pt to Medsurg team 10 for HCAP.  Will start pt on Vancomycin and cefepime.     Junius FinnerErin O'Malley, PA-C 03/13/13 [phone]

## 2013-03-12 NOTE — Progress Notes (Signed)
Called for report on pt 

## 2013-03-13 ENCOUNTER — Inpatient Hospital Stay (HOSPITAL_COMMUNITY): Payer: Medicare Other

## 2013-03-13 ENCOUNTER — Encounter (HOSPITAL_COMMUNITY): Payer: Self-pay | Admitting: Emergency Medicine

## 2013-03-13 DIAGNOSIS — F0390 Unspecified dementia without behavioral disturbance: Secondary | ICD-10-CM

## 2013-03-13 DIAGNOSIS — J159 Unspecified bacterial pneumonia: Principal | ICD-10-CM

## 2013-03-13 DIAGNOSIS — A8100 Creutzfeldt-Jakob disease, unspecified: Secondary | ICD-10-CM

## 2013-03-13 LAB — BASIC METABOLIC PANEL
BUN: 14 mg/dL (ref 6–23)
BUN: 14 mg/dL (ref 6–23)
CO2: 24 mEq/L (ref 19–32)
CO2: 27 meq/L (ref 19–32)
CREATININE: 0.72 mg/dL (ref 0.50–1.10)
Calcium: 9.4 mg/dL (ref 8.4–10.5)
Calcium: 8.5 mg/dL (ref 8.4–10.5)
Chloride: 104 mEq/L (ref 96–112)
Chloride: 105 mEq/L (ref 96–112)
Creatinine, Ser: 0.68 mg/dL (ref 0.50–1.10)
GFR calc Af Amer: >90 mL/min (ref >90)
GFR calc non Af Amer: >90 mL/min (ref >90)
GFR calc non Af Amer: >90 mL/min (ref >90)
Glucose, Bld: 111 mg/dL — ABNORMAL HIGH (ref 70–99)
Glucose, Bld: 109 mg/dL — ABNORMAL HIGH (ref 70–99)
POTASSIUM: 4.1 meq/L (ref 3.7–5.3)
Potassium: 3.6 mEq/L — ABNORMAL LOW (ref 3.7–5.3)
SODIUM: 143 meq/L (ref 137–147)
Sodium: 143 mEq/L (ref 137–147)

## 2013-03-13 LAB — HIV ANTIBODY (ROUTINE TESTING W REFLEX): HIV: NONREACTIVE

## 2013-03-13 LAB — CBC
HEMATOCRIT: 35.2 % — AB (ref 36.0–46.0)
Hemoglobin: 11.5 g/dL — ABNORMAL LOW (ref 12.0–15.0)
MCH: 28 pg (ref 26.0–34.0)
MCHC: 32.7 g/dL (ref 30.0–36.0)
MCV: 85.9 fL (ref 78.0–100.0)
PLATELETS: 288 10*3/uL (ref 150–400)
RBC: 4.1 MIL/uL (ref 3.87–5.11)
RDW: 16 % — ABNORMAL HIGH (ref 11.5–15.5)
WBC: 14.1 10*3/uL — ABNORMAL HIGH (ref 4.0–10.5)

## 2013-03-13 LAB — MRSA PCR SCREENING: MRSA by PCR: POSITIVE — AB

## 2013-03-13 LAB — INFLUENZA PANEL BY PCR (TYPE A & B)
H1N1 flu by pcr: NOT DETECTED
Influenza A By PCR: NEGATIVE
Influenza B By PCR: NEGATIVE

## 2013-03-13 LAB — GLUCOSE, CAPILLARY
GLUCOSE-CAPILLARY: 130 mg/dL — AB (ref 70–99)
GLUCOSE-CAPILLARY: 116 mg/dL — AB (ref 70–99)
GLUCOSE-CAPILLARY: 126 mg/dL — AB (ref 70–99)

## 2013-03-13 MED ORDER — PANTOPRAZOLE SODIUM 40 MG PO PACK
40.0000 mg | PACK | Freq: Every day | ORAL | Status: DC
  Administered 2013-03-13 – 2013-03-15 (×3): 40 mg
  Filled 2013-03-13 (×3): qty 20

## 2013-03-13 MED ORDER — MORPHINE SULFATE 2 MG/ML IJ SOLN
1.0000 mg | INTRAMUSCULAR | Status: DC | PRN
  Administered 2013-03-13 – 2013-03-14 (×4): 1 mg via INTRAVENOUS
  Filled 2013-03-13 (×4): qty 1

## 2013-03-13 MED ORDER — ZINC OXIDE 20 % EX OINT: TOPICAL_OINTMENT | Freq: Three times a day (TID) | CUTANEOUS | Status: DC | PRN

## 2013-03-13 MED ORDER — UREA 10 % EX LOTN
TOPICAL_LOTION | Freq: Two times a day (BID) | CUTANEOUS | Status: DC
  Administered 2013-03-13 – 2013-03-15 (×5): via TOPICAL
  Filled 2013-03-13: qty 177

## 2013-03-13 MED ORDER — VANCOMYCIN HCL IN DEXTROSE 750-5 MG/150ML-% IV SOLN
750.0000 mg | Freq: Three times a day (TID) | INTRAVENOUS | Status: DC
  Administered 2013-03-13 – 2013-03-14 (×5): 750 mg via INTRAVENOUS
  Filled 2013-03-13 (×6): qty 150

## 2013-03-13 MED ORDER — ONDANSETRON HCL 4 MG/2ML IJ SOLN
4.0000 mg | Freq: Four times a day (QID) | INTRAMUSCULAR | Status: DC | PRN
  Administered 2013-03-13: 4 mg via INTRAVENOUS
  Filled 2013-03-13: qty 2

## 2013-03-13 MED ORDER — MUPIROCIN 2 % EX OINT
1.0000 "application " | TOPICAL_OINTMENT | Freq: Two times a day (BID) | CUTANEOUS | Status: DC
  Administered 2013-03-14 – 2013-03-15 (×3): 1 via NASAL
  Filled 2013-03-13 (×3): qty 22

## 2013-03-13 MED ORDER — CHLORHEXIDINE GLUCONATE CLOTH 2 % EX PADS
6.0000 | MEDICATED_PAD | Freq: Every day | CUTANEOUS | Status: DC
  Administered 2013-03-13 – 2013-03-15 (×3): 6 via TOPICAL

## 2013-03-13 NOTE — ED Provider Notes (Signed)
Medical screening examination/treatment/procedure(s) were conducted as a shared visit with non-physician practitioner(s) and myself.  I personally evaluated the patient during the encounter.   Please see my separate note.     [name] ChurnJohn David [name], MD 03/13/13 [phone]

## 2013-03-13 NOTE — ED Provider Notes (Addendum)
Medical screening examination/treatment/procedure(s) were conducted as a shared visit with non-physician practitioner(s) and myself.  I personally evaluated the patient during the encounter.  EKG Interpretation    Date/Time:  Friday March 12 2013 19:14:08 EST Ventricular Rate:  100 PR Interval:  139 QRS Duration: 74 QT Interval:  357 QTC Calculation: 460 R Axis:   31 Text Interpretation:  Sinus tachycardia Abnormal R-wave progression, early transition No old tracing to compare Confirmed by [facility]  MD, TREY ([id number]) on 03/13/2013 1:38:24 PM            43 yo female presentig from nursing facility with hypoxia and purulent cough.  On exam, nonverbal, sleeping, awakes to light touch, tachypneic with mild wheezing.  CXR concerning for pneumonia.  Will need HCAP treatment, admit.   Clinical Impression: 1. HCAP (healthcare-associated pneumonia)       Candyce ChurnJohn David Oluwatamilore Starnes, MD 03/13/13 1337  Candyce ChurnJohn David Ponciano Shealy, MD 03/13/13 1339

## 2013-03-13 NOTE — Progress Notes (Signed)
[facility] PROGRESS NOTE  [name] Mccarry ONG:[id number] DOB: 08/17/1970 DOA: 03/12/2013 PCP: [name], SAAD, MD  Assessment/Plan: 1. HCAP:  Now on Vanc an Zosyn empirically. Blood cultures pending. CXR with bilateral perihilar and medial basilar opacities. WBC 12.2.  Afebrile. No respiratory distress at this time. Continue broad spectrum antibiotics for now and await cutlures. 2. Hyperkalemia: resolved and slightly low this morning. Will monitor. Question initial hemoysis/lab error. 3. 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 Dementia: CJD. Stable. Will try to ascertain baseline level of cogitative function. 5. RLS: continue requip.  Code Status: FULL Family Communication: patient, no family present. Will try to reach family today. Disposition Plan: inpatient   Consultants: None  Procedures:  none  Antibiotics:  Levaquin 1/9 x1  Vanc 1/10>>  Zosyn 1/10>>  HPI/Subjective: 43 year old female with history of [name], dementia, restless leg syndrome, depression, aphasia, dysphagia who was sent from skilled nursing facility for shortness of breath .  Currently no respiratory distress and 02 sts are stable in high 90%.     Objective: Filed Vitals:   03/13/13 0617  BP: 93/66  Pulse: 95  Temp: 98.7 F (37.1 C)  Resp: 26   No intake or output data in the 24 hours ending 03/13/13 0736 Filed Weights   03/12/13 2339  Weight: 62.7 kg (138 lb 3.7 oz)    Exam:   General:  Calm, no distress, does not respond to voice  Cardiovascular: RRR no mrg  Respiratory: bilateral rhonchi, good air movement  Abdomen: BS+, soft, PEG tube in place  Musculoskeletal: does not follow commands, does withdraw feet when manipulated, generalized muscle atrophy  Data Reviewed: Basic Metabolic Panel:  Recent Labs Lab 03/12/13 1946 03/13/13 0050  NA 142 143  K 5.7* 3.6*  CL 102 104  CO2 26 27  GLUCOSE 102* 111*  BUN 14 14  CREATININE 0.66 0.72   CALCIUM 9.0 9.4   Liver Function Tests:  Recent Labs Lab 03/12/13 1946  AST 63*  ALT 27  ALKPHOS 102  BILITOT 0.9  PROT 8.4*  ALBUMIN 3.5   No results found for this basename: LIPASE, AMYLASE,  in the last 168 hours No results found for this basename: AMMONIA,  in the last 168 hours CBC:  Recent Labs Lab 03/12/13 1944  WBC 12.2*  HGB 13.1  HCT 39.4  MCV 85.3  PLT 327   Cardiac Enzymes: No results found for this basename: CKTOTAL, CKMB, CKMBINDEX, TROPONINI,  in the last 168 hours BNP (last 3 results) No results found for this basename: PROBNP,  in the last 8760 hours CBG: No results found for this basename: GLUCAP,  in the last 168 hours  Recent Results (from the past 240 hour(s))  MRSA PCR SCREENING     Status: Abnormal   Collection Time    03/13/13 12:14 AM      Result Value Range Status   MRSA by PCR POSITIVE (*) NEGATIVE Final   Comment:            The GeneXpert MRSA Assay (FDA     approved for NASAL specimens     only), is one component of a     comprehensive MRSA colonization     surveillance program. It is not     intended to diagnose MRSA     infection nor to guide or     monitor treatment for     MRSA infections.     RESULT CALLED TO, READ BACK BY AND  VERIFIED WITH:     CALLED TO RN Vikki Ports CONLEY [id number] @[phone]  THANEY     Studies: Dg Chest Portable 1 View  03/12/2013   CLINICAL DATA:  Shortness of breath, history of MN  EXAM: PORTABLE CHEST - 1 VIEW  COMPARISON:  None  FINDINGS: Grossly normal cardiac silhouette and mediastinal contours given decreased lung volumes and patient rotation. Bilateral perihilar and medial basilar heterogeneous possible airspace opacities. No definite pleural effusion pneumothorax. No evidence of edema. Grossly unchanged bones.  IMPRESSION: Decreased lung volumes with bilateral perihilar and medial basilar opacities - atelectasis versus infiltrate. Further evaluation with a PA and lateral chest radiograph may be obtained as  clinically indicated.   Electronically Signed   By: Simonne Come M.D.   On: 03/12/2013 20:46   Dg Abd Portable 1v  03/13/2013   CLINICAL DATA:  Small bowel obstruction.  EXAM: PORTABLE ABDOMEN - 1 VIEW  COMPARISON:  None.  FINDINGS: The bowel gas pattern is nonobstructive. A moderately large volume of stool is seen throughout the colon. Feeding tube is noted.  IMPRESSION: No acute finding.  Moderately large stool burden.   Electronically Signed   By: Drusilla Kanner M.D.   On: 03/13/2013 01:17    Scheduled Meds: . albuterol  2.5 mg Nebulization Q4H  . ceFEPime (MAXIPIME) IV  1 g Intravenous Q8H  . ceFEPime (MAXIPIME) IV  2 g Intravenous Once  . clotrimazole   Topical BID  . enoxaparin (LOVENOX) injection  40 mg Subcutaneous Daily  . free water  150 mL Per Tube Q4H  . furosemide  40 mg Per Tube Daily  . loratadine  10 mg Oral Daily  . meloxicam  7.5 mg Oral Q breakfast  . oseltamivir  75 mg Oral BID  . pantoprazole sodium  40 mg Per Tube Daily  . polyethylene glycol  17 g Per J Tube Daily  . rOPINIRole  0.25 mg Per J Tube QHS  . urea   Topical BID  . vancomycin  750 mg Intravenous Q8H  . vitamin B-12  1,000 mcg Per J Tube Daily   Continuous Infusions: . sodium chloride 75 mL/hr at 03/13/13 0344  . feeding supplement (JEVITY 1.5 CAL) 45 mL/hr (03/13/13 0343)    Principal Problem:   HCAP (healthcare-associated pneumonia) Active Problems:   Dementia   [name]   Dysphasia   SIRS (systemic inflammatory response syndrome)   Hyperkalemia   Healthcare associated bacterial pneumonia    Time spent: 35 minutes    [facility]  [facility] Pager [phone]. If 7PM-7AM, please contact night-coverage at [email or web address], password [facility] 03/13/2013, 7:36 AM  LOS: 1 day

## 2013-03-13 NOTE — Progress Notes (Signed)
INITIAL NUTRITION ASSESSMENT  DOCUMENTATION CODES Per approved criteria  -Not Applicable   INTERVENTION: Recommend increasing Jevity 1.5 at goal rate of 50 ml/hr.  At goal rate, tube feeding regimen will provide 1800 kcal, 76 grams of protein, and 912 ml of H2O.   NUTRITION DIAGNOSIS: Inadequate oral intake related to inability to eat as evidenced by PEG.   Goal: Pt to meet >/= 90% of their estimated nutrition needs   Monitor:  TF rate/tolerance Weight trends Labs  Reason for Assessment: TF/MST  43 y.o. female  Admitting Dx: HCAP (healthcare-associated pneumonia)  ASSESSMENT: 43 year old female with history of [name], dementia, restless leg syndrome, depression, aphasia, dysphagia who was sent from skilled nursing facility for shortness of breath since today.  Given recent hospitalization and she being a resident of skilled nursing facility patient admitted for management of healthcare associated pneumonia.  Per MST, pt reported recent weight loss. Pt asleep at time of visit with Jevity 1.5 running at goal rate of 45 ml/hr. Pt appears well nourished. Pt with stage 1 pressure ulcer to buttocks. Recommend increasing TF rate to 50 ml/hr to better meet estimated protein needs.   Height: Ht Readings from Last 1 Encounters:  03/12/13 5\' 8"  (1.727 m)    Weight: Wt Readings from Last 1 Encounters:  03/12/13 138 lb 3.7 oz (62.7 kg)    Ideal Body Weight: 140 lbs  % Ideal Body Weight: 99%  Wt Readings from Last 10 Encounters:  03/12/13 138 lb 3.7 oz (62.7 kg)    Usual Body Weight: unknown  % Usual Body Weight: NA  BMI:  Body mass index is 21.02 kg/(m^2).  Estimated Nutritional Needs: Kcal: 1600-1800 Protein: 75-85 grams Fluid: 1.8- 2 L/day  Skin: stage 1 pressure ulcer to buttocks  Diet Order: NPO  EDUCATION NEEDS: -No education needs identified at this time  No intake or output data in the 24 hours ending 03/13/13 1637  Last BM:  PTA  Labs:   Recent Labs Lab 03/12/13 1946 03/13/13 0050 03/13/13 0940  NA 142 143 143  K 5.7* 3.6* 4.1  CL 102 104 105  CO2 26 27 24   BUN 14 14 14   CREATININE 0.66 0.72 0.68  CALCIUM 9.0 9.4 8.5  GLUCOSE 102* 111* 109*    CBG (last 3)   Recent Labs  03/13/13 0842 03/13/13 1204  GLUCAP 130* 116*    Scheduled Meds: . albuterol  2.5 mg Nebulization Q4H  . ceFEPime (MAXIPIME) IV  1 g Intravenous Q8H  . ceFEPime (MAXIPIME) IV  2 g Intravenous Once  . Chlorhexidine Gluconate Cloth  6 each Topical Q0600  . clotrimazole   Topical BID  . enoxaparin (LOVENOX) injection  40 mg Subcutaneous Daily  . free water  150 mL Per Tube Q4H  . furosemide  40 mg Per Tube Daily  . loratadine  10 mg Oral Daily  . meloxicam  7.5 mg Oral Q breakfast  . mupirocin ointment  1 application Nasal BID  . pantoprazole sodium  40 mg Per Tube Daily  . polyethylene glycol  17 g Per J Tube Daily  . rOPINIRole  0.25 mg Per J Tube QHS  . urea   Topical BID  . vancomycin  750 mg Intravenous Q8H  . vitamin B-12  1,000 mcg Per J Tube Daily    Continuous Infusions: . sodium chloride 75 mL/hr at 03/13/13 0344  . feeding supplement (JEVITY 1.5 CAL) 45 mL/hr (03/13/13 0343)    Past Medical History  Diagnosis  Date  . Dementia   . Restless leg syndrome   . Esophageal reflux   . UTI (urinary tract infection)   . Depression   . Pneumonia   . Constipation   . Aphagia   . Bacteremia   . Sepsis   . Lenise ArenaCreutzfeldt Jakob disease   . Vitamin B12 deficiency     Past Surgical History  Procedure Laterality Date  . Peg tube placement      16 [place]  . Cholecystectomy      Ian Malkineanne Barnett RD, LDN Inpatient Clinical Dietitian Pager: [phone] After Hours Pager: [phone]

## 2013-03-13 NOTE — Progress Notes (Signed)
ANTIBIOTIC CONSULT NOTE - FOLLOW UP  Pharmacy Consult for Vancomycin/Cefepime Indication: rule out pneumonia, HCAP  Allergies  Allergen Reactions  . Sulfa Antibiotics Other (See Comments)    ON MAR   Patient Measurements: Height: 5\' 8"  (172.7 cm) Weight: 138 lb 3.7 oz (62.7 kg) IBW/kg (Calculated) : 63.9  Vital Signs: Temp: 98.7 F (37.1 C) (01/09 2339) Temp src: Axillary (01/09 2339) BP: 114/58 mmHg (01/09 2339) Pulse Rate: 116 (01/09 2339) Labs:  Recent Labs  03/12/13 1944 03/12/13 1946  WBC 12.2*  --   HGB 13.1  --   PLT 327  --   CREATININE  --  0.66   Estimated Creatinine Clearance: 89.8 ml/min (by C-G formula based on Cr of 0.66). No results found for this basename: VANCOTROUGH, Leodis BinetVANCOPEAK, VANCORANDOM, GENTTROUGH, GENTPEAK, GENTRANDOM, TOBRATROUGH, TOBRAPEAK, TOBRARND, AMIKACINPEAK, AMIKACINTROU, AMIKACIN,  in the last 72 hours    Anti-infectives   Start     Dose/Rate Route Frequency Ordered Stop   03/13/13 0600  ceFEPIme (MAXIPIME) 1 g in dextrose 5 % 50 mL IVPB     1 g 100 mL/hr over 30 Minutes Intravenous 3 times per day 03/12/13 2345 03/21/13 0559   03/12/13 2330  oseltamivir (TAMIFLU) capsule 75 mg     75 mg Oral 2 times daily 03/12/13 2329 03/17/13 2159   03/12/13 2245  ceFEPIme (MAXIPIME) 2 g in dextrose 5 % 50 mL IVPB     2 g 100 mL/hr over 30 Minutes Intravenous  Once 03/12/13 2237     03/12/13 2200  vancomycin (VANCOCIN) IVPB 1000 mg/200 mL premix     1,000 mg 200 mL/hr over 60 Minutes Intravenous  Once 03/12/13 2154 03/12/13 2338   03/12/13 2115  levofloxacin (LEVAQUIN) IVPB 750 mg  Status:  Discontinued     750 mg 100 mL/hr over 90 Minutes Intravenous  Once 03/12/13 2112 03/12/13 2154     Assessment: 43 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 CXR with atelectasis vs. Infiltrate. WBC 12.2, other labs as above.   Goal of Therapy:  Vancomycin trough level 15-20 mcg/ml  Plan:  -Vancomycin 750 mg IV q8h for 8  days -Cefepime 1g IV q8h for 8 days (already ordered by MD) -Trend WBC, temp, renal function  -Drug levels as indicated   Abran DukeLedford, Cherish Runde 03/13/2013,12:44 AM

## 2013-03-14 ENCOUNTER — Encounter (HOSPITAL_COMMUNITY): Payer: Self-pay | Admitting: Emergency Medicine

## 2013-03-14 LAB — CBC
HCT: 31.2 % — ABNORMAL LOW (ref 36.0–46.0)
Hemoglobin: 10.2 g/dL — ABNORMAL LOW (ref 12.0–15.0)
MCH: 28.2 pg (ref 26.0–34.0)
MCHC: 32.7 g/dL (ref 30.0–36.0)
MCV: 86.2 fL (ref 78.0–100.0)
PLATELETS: 251 10*3/uL (ref 150–400)
RBC: 3.62 MIL/uL — AB (ref 3.87–5.11)
RDW: 16.4 % — ABNORMAL HIGH (ref 11.5–15.5)
WBC: 9.5 10*3/uL (ref 4.0–10.5)

## 2013-03-14 LAB — BASIC METABOLIC PANEL
BUN: 11 mg/dL (ref 6–23)
CHLORIDE: 106 meq/L (ref 96–112)
CO2: 25 mEq/L (ref 19–32)
Calcium: 8.1 mg/dL — ABNORMAL LOW (ref 8.4–10.5)
Creatinine, Ser: 0.83 mg/dL (ref 0.50–1.10)
GFR calc Af Amer: >90 mL/min (ref >90)
GFR calc non Af Amer: 85 mL/min — ABNORMAL LOW (ref >90)
Glucose, Bld: 145 mg/dL — ABNORMAL HIGH (ref 70–99)
POTASSIUM: 3.4 meq/L — AB (ref 3.7–5.3)
SODIUM: 143 meq/L (ref 137–147)

## 2013-03-14 LAB — VANCOMYCIN, TROUGH: VANCOMYCIN TR: 23.7 ug/mL — AB (ref 10.0–20.0)

## 2013-03-14 MED ORDER — POTASSIUM CHLORIDE 20 MEQ/15ML (10%) PO LIQD
40.0000 meq | Freq: Once | ORAL | Status: AC
  Administered 2013-03-14: 40 meq via ORAL
  Filled 2013-03-14: qty 30

## 2013-03-14 MED ORDER — ALBUTEROL SULFATE (2.5 MG/3ML) 0.083% IN NEBU
2.5000 mg | INHALATION_SOLUTION | Freq: Three times a day (TID) | RESPIRATORY_TRACT | Status: DC
  Administered 2013-03-14 – 2013-03-15 (×3): 2.5 mg via RESPIRATORY_TRACT
  Filled 2013-03-14 (×3): qty 3

## 2013-03-14 MED ORDER — VANCOMYCIN HCL IN DEXTROSE 1-5 GM/200ML-% IV SOLN
1000.0000 mg | Freq: Two times a day (BID) | INTRAVENOUS | Status: DC
  Administered 2013-03-15: 1000 mg via INTRAVENOUS
  Filled 2013-03-14 (×3): qty 200

## 2013-03-14 NOTE — Progress Notes (Signed)
[facility] PROGRESS NOTE  [name] SchmidtChevella Bones XBM:[id number] DOB: 02/11/1971 DOA: 03/12/2013 PCP: [name], SAAD, MD  Assessment/Plan: 1. HCAP:  Now on Vanc an Zosyn empirically. Blood cultures negative to date. CXR with bilateral perihilar and medial basilar opacities. WBC down  Afebrile. No respiratory distress at this time. Continue broad spectrum antibiotics for now and await cutlures. 2. Hyperkalemia: resolved and slightly low this morning. Will monitor. replete 3. 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 Dementia: CJD. Stable. Spoke with sister and brother yesterday. This is the patients baseline. 5. RLS: continue requip.  Code Status: FULL Family Communication: patient, spoke with brother and sister yesterday Disposition Plan: inpatient   Consultants: None  Procedures:  none  Antibiotics:  Levaquin 1/9 x1  Vanc 1/10>>  Zosyn 1/10>>  HPI/Subjective: 43 year old female with history of [name], dementia, restless leg syndrome, depression, aphasia, dysphagia who was sent from skilled nursing facility for shortness of breath .  Currently no respiratory distress and 02 sts are stable in high 90%.     Objective: Filed Vitals:   03/14/13 1325  BP: 111/76  Pulse: 83  Temp: 97.7 F (36.5 C)  Resp: 20    Intake/Output Summary (Last 24 hours) at 03/14/13 1648 Last data filed at 03/13/13 1824  Gross per 24 hour  Intake   1250 ml  Output      0 ml  Net   1250 ml   Filed Weights   03/12/13 2339  Weight: 62.7 kg (138 lb 3.7 oz)    Exam:   General:  Calm, no distress, does not respond to voice  Cardiovascular: RRR no mrg  Respiratory: bilateral rhonchi, good air movement  Abdomen: BS+, soft, PEG tube in place  Musculoskeletal: does not follow commands, does withdraw feet when manipulated, generalized muscle atrophy  Data Reviewed: Basic Metabolic Panel:  Recent Labs Lab 03/12/13 1946 03/13/13 0050 03/13/13 0940  03/14/13 0335  NA 142 143 143 143  K 5.7* 3.6* 4.1 3.4*  CL 102 104 105 106  CO2 26 27 24 25   GLUCOSE 102* 111* 109* 145*  BUN 14 14 14 11   CREATININE 0.66 0.72 0.68 0.83  CALCIUM 9.0 9.4 8.5 8.1*   Liver Function Tests:  Recent Labs Lab 03/12/13 1946  AST 63*  ALT 27  ALKPHOS 102  BILITOT 0.9  PROT 8.4*  ALBUMIN 3.5   No results found for this basename: LIPASE, AMYLASE,  in the last 168 hours No results found for this basename: AMMONIA,  in the last 168 hours CBC:  Recent Labs Lab 03/12/13 1944 03/13/13 0940 03/14/13 0335  WBC 12.2* 14.1* 9.5  HGB 13.1 11.5* 10.2*  HCT 39.4 35.2* 31.2*  MCV 85.3 85.9 86.2  PLT 327 288 251   Cardiac Enzymes: No results found for this basename: CKTOTAL, CKMB, CKMBINDEX, TROPONINI,  in the last 168 hours BNP (last 3 results) No results found for this basename: PROBNP,  in the last 8760 hours CBG:  Recent Labs Lab 03/13/13 0842 03/13/13 1204 03/13/13 1701  GLUCAP 130* 116* 126*    Recent Results (from the past 240 hour(s))  CULTURE, BLOOD (ROUTINE X 2)     Status: None   Collection Time    03/12/13 12:44 AM      Result Value Range Status   Specimen Description BLOOD LEFT HAND   Final   Special Requests BOTTLES DRAWN AEROBIC ONLY 5CC   Final   Culture  Setup Time     Final  Value: 03/13/2013 05:15     Performed at [facility]   Culture     Final   Value:        BLOOD CULTURE RECEIVED NO GROWTH TO DATE CULTURE WILL BE HELD FOR 5 DAYS BEFORE ISSUING A FINAL NEGATIVE REPORT     Performed at [facility]   Report Status PENDING   Incomplete  CULTURE, BLOOD (ROUTINE X 2)     Status: None   Collection Time    03/12/13 12:50 AM      Result Value Range Status   Specimen Description BLOOD LEFT HAND   Final   Special Requests BOTTLES DRAWN AEROBIC ONLY 5.5CC   Final   Culture  Setup Time     Final   Value: 03/13/2013 05:14     Performed at [facility]   Culture     Final   Value:        BLOOD  CULTURE RECEIVED NO GROWTH TO DATE CULTURE WILL BE HELD FOR 5 DAYS BEFORE ISSUING A FINAL NEGATIVE REPORT     Performed at [facility]   Report Status PENDING   Incomplete  MRSA PCR SCREENING     Status: Abnormal   Collection Time    03/13/13 12:14 AM      Result Value Range Status   MRSA by PCR POSITIVE (*) NEGATIVE Final   Comment:            The GeneXpert MRSA Assay (FDA     approved for NASAL specimens     only), is one component of a     comprehensive MRSA colonization     surveillance program. It is not     intended to diagnose MRSA     infection nor to guide or     monitor treatment for     MRSA infections.     RESULT CALLED TO, READ BACK BY AND VERIFIED WITH:     CALLED TO RN Vikki Ports CONLEY [id number] @[phone]  THANEY     Studies: Dg Chest 1 View  03/13/2013   CLINICAL DATA:  Pneumonia.  EXAM: CHEST - 1 VIEW  COMPARISON:  03/12/2013  FINDINGS: Lung volumes are reduced compared to the prior study with bibasilar atelectasis present. No significant airspace consolidation, edema or pleural fluid is identified. The heart size is normal.  IMPRESSION: Lower bilateral lung volumes with bibasilar atelectasis.   Electronically Signed   By: Irish Lack M.D.   On: 03/13/2013 09:16   Dg Chest Portable 1 View  03/12/2013   CLINICAL DATA:  Shortness of breath, history of MN  EXAM: PORTABLE CHEST - 1 VIEW  COMPARISON:  None  FINDINGS: Grossly normal cardiac silhouette and mediastinal contours given decreased lung volumes and patient rotation. Bilateral perihilar and medial basilar heterogeneous possible airspace opacities. No definite pleural effusion pneumothorax. No evidence of edema. Grossly unchanged bones.  IMPRESSION: Decreased lung volumes with bilateral perihilar and medial basilar opacities - atelectasis versus infiltrate. Further evaluation with a PA and lateral chest radiograph may be obtained as clinically indicated.   Electronically Signed   By: Simonne Come M.D.   On: 03/12/2013  20:46   Dg Abd Portable 1v  03/13/2013   CLINICAL DATA:  Small bowel obstruction.  EXAM: PORTABLE ABDOMEN - 1 VIEW  COMPARISON:  None.  FINDINGS: The bowel gas pattern is nonobstructive. A moderately large volume of stool is seen throughout the colon. Feeding tube is noted.  IMPRESSION: No acute finding.  Moderately large stool burden.   Electronically Signed   By: Drusilla Kanner M.D.   On: 03/13/2013 01:17    Scheduled Meds: . albuterol  2.5 mg Nebulization TID  . ceFEPime (MAXIPIME) IV  1 g Intravenous Q8H  . ceFEPime (MAXIPIME) IV  2 g Intravenous Once  . Chlorhexidine Gluconate Cloth  6 each Topical Q0600  . clotrimazole   Topical BID  . enoxaparin (LOVENOX) injection  40 mg Subcutaneous Daily  . free water  150 mL Per Tube Q4H  . furosemide  40 mg Per Tube Daily  . loratadine  10 mg Oral Daily  . meloxicam  7.5 mg Oral Q breakfast  . mupirocin ointment  1 application Nasal BID  . pantoprazole sodium  40 mg Per Tube Daily  . polyethylene glycol  17 g Per J Tube Daily  . rOPINIRole  0.25 mg Per J Tube QHS  . urea   Topical BID  . vancomycin  750 mg Intravenous Q8H  . vitamin B-12  1,000 mcg Per J Tube Daily   Continuous Infusions: . sodium chloride 75 mL/hr at 03/14/13 0822  . feeding supplement (JEVITY 1.5 CAL) 45 mL/hr (03/13/13 0343)    Principal Problem:   HCAP (healthcare-associated pneumonia) Active Problems:   Dementia   [name]   Dysphasia   SIRS (systemic inflammatory response syndrome)   Hyperkalemia   Healthcare associated bacterial pneumonia    Time spent: 35 minutes    [facility]  [facility] Pager [phone]. If 7PM-7AM, please contact night-coverage at [email or web address], password [facility] 03/14/2013, 4:48 PM  LOS: 2 days

## 2013-03-14 NOTE — Progress Notes (Signed)
ANTIBIOTIC CONSULT NOTE - FOLLOW UP  Pharmacy Consult for Vancomycin/Cefepime Indication: rule out pneumonia, HCAP  Allergies  Allergen Reactions  . Sulfa Antibiotics Other (See Comments)    ON MAR   Patient Measurements: Height: 5\' 8"  (172.7 cm) Weight: 138 lb 3.7 oz (62.7 kg) IBW/kg (Calculated) : 63.9  Vital Signs: Temp: 97.7 F (36.5 C) (01/11 1325) Temp src: Oral (01/11 1325) BP: 111/76 mmHg (01/11 1325) Pulse Rate: 83 (01/11 1325) Labs:  Recent Labs  03/12/13 1944  03/13/13 0050 03/13/13 0940 03/14/13 0335  WBC 12.2*  --   --  14.1* 9.5  HGB 13.1  --   --  11.5* 10.2*  PLT 327  --   --  288 251  CREATININE  --   < > 0.72 0.68 0.83  < > = values in this interval not displayed. Estimated Creatinine Clearance: 86.5 ml/min (by C-G formula based on Cr of 0.83).  Recent Labs  03/14/13 1809  VANCOTROUGH 23.7*      Anti-infectives   Start     Dose/Rate Route Frequency Ordered Stop   03/13/13 1000  vancomycin (VANCOCIN) IVPB 750 mg/150 ml premix     750 mg 150 mL/hr over 60 Minutes Intravenous Every 8 hours 03/13/13 0048     03/13/13 0600  ceFEPIme (MAXIPIME) 1 g in dextrose 5 % 50 mL IVPB     1 g 100 mL/hr over 30 Minutes Intravenous 3 times per day 03/12/13 2345 03/21/13 0559   03/12/13 2330  oseltamivir (TAMIFLU) capsule 75 mg  Status:  Discontinued     75 mg Oral 2 times daily 03/12/13 2329 03/13/13 1409   03/12/13 2245  ceFEPIme (MAXIPIME) 2 g in dextrose 5 % 50 mL IVPB     2 g 100 mL/hr over 30 Minutes Intravenous  Once 03/12/13 2237     03/12/13 2200  vancomycin (VANCOCIN) IVPB 1000 mg/200 mL premix     1,000 mg 200 mL/hr over 60 Minutes Intravenous  Once 03/12/13 2154 03/12/13 2338   03/12/13 2115  levofloxacin (LEVAQUIN) IVPB 750 mg  Status:  Discontinued     750 mg 100 mL/hr over 90 Minutes Intravenous  Once 03/12/13 2112 03/12/13 2154     Assessment: 43 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 CXR with atelectasis vs.  Infiltrate. WBC 9.5, SCr= 0.83, CrCl ~ 85 and vancomycin trough today was above goal at 23.7.   LVQ 1/9 x1 Vanc 1/9 >> Cefepime 1/9 >> Tamiflu 1/9 >>1/10  1/9 blood x 2 ngtd 1/10 MRSA + 1/10 Flu neg --> paged MD to d/c Tamiflu.    Goal of Therapy:  Vancomycin trough level 15-20 mcg/ml  Plan:   -Change vancomycin to 1000mg  IV q12h -Could consider d/c vancomycin/ -Will follow renal function, cultures and clinical progress  Harland Germanndrew Thorin Starner, Pharm D 03/14/2013 7:58 PM

## 2013-03-15 LAB — BASIC METABOLIC PANEL
BUN: 6 mg/dL (ref 6–23)
CO2: 28 meq/L (ref 19–32)
Calcium: 8.7 mg/dL (ref 8.4–10.5)
Chloride: 104 mEq/L (ref 96–112)
Creatinine, Ser: 0.68 mg/dL (ref 0.50–1.10)
GFR calc Af Amer: >90 mL/min (ref >90)
GLUCOSE: 101 mg/dL — AB (ref 70–99)
POTASSIUM: 4.1 meq/L (ref 3.7–5.3)
SODIUM: 143 meq/L (ref 137–147)

## 2013-03-15 LAB — CBC
HCT: 34.2 % — ABNORMAL LOW (ref 36.0–46.0)
Hemoglobin: 10.8 g/dL — ABNORMAL LOW (ref 12.0–15.0)
MCH: 27.1 pg (ref 26.0–34.0)
MCHC: 31.6 g/dL (ref 30.0–36.0)
MCV: 85.7 fL (ref 78.0–100.0)
PLATELETS: 278 10*3/uL (ref 150–400)
RBC: 3.99 MIL/uL (ref 3.87–5.11)
RDW: 16.2 % — ABNORMAL HIGH (ref 11.5–15.5)
WBC: 8.5 10*3/uL (ref 4.0–10.5)

## 2013-03-15 MED ORDER — MUPIROCIN 2 % EX OINT: 1.0000 "application " | TOPICAL_OINTMENT | Freq: Two times a day (BID) | CUTANEOUS | Status: AC

## 2013-03-15 MED ORDER — LEVOFLOXACIN 25 MG/ML PO SOLN
500.0000 mg | Freq: Every day | ORAL | Status: DC
  Administered 2013-03-15: 16:00:00 500 mg via ORAL
  Filled 2013-03-15: qty 20

## 2013-03-15 MED ORDER — LEVOFLOXACIN 25 MG/ML PO SOLN: 500.0000 mg | Freq: Every day | ORAL | Status: DC

## 2013-03-15 NOTE — Progress Notes (Signed)
RN attempt to call report to facility -  No answer.

## 2013-03-15 NOTE — Clinical Social Work Note (Signed)
Sister [name] made aware of patient's DC. All questions answered.   Roddie McBryant Kimberlyann Hollar, [place]ound[place]CSWA, [place], [phone]

## 2013-03-15 NOTE — Clinical Social Work Psychosocial (Signed)
Clinical Social Work Department BRIEF PSYCHOSOCIAL ASSESSMENT 03/15/2013  Patient:  Kun,Emilia     Account Number:  [id number]     Admit date:  03/12/2013  Clinical Social Worker:  Lavell LusterAMPBELL,Jilian West BRYANT, LCSWA  Date/Time:  03/15/2013 01:30 PM  Referred by:  Physician  Date Referred:  03/15/2013 Referred for  SNF Placement   Other Referral:   Interview type:  Family Other interview type:   Patient non verbal, CSW interviewed sister Elease Hashimotoatricia.    PSYCHOSOCIAL DATA Living Status:  FACILITY Admitted from facility:  [facility] Level of care:  Skilled Nursing Facility Primary support name:  Theresia Loatricia Smith Primary support relationship to patient:  SIBLING Degree of support available:   Support is adequate.    CURRENT CONCERNS Current Concerns  Post-Acute Placement   Other Concerns:    SOCIAL WORK ASSESSMENT / PLAN CSW spoke with sister Elease Hashimotoatricia [facility] and the family plans for her to return at DC. Sister states that Kathryne ErikssonJanet Polinsky is patient's decision maker and that CSW needed to speak with her. CSW informed Elease Hashimotoatricia that CSW has attempted to reach [place] at both numbers listed with no success and asked that Elease Hashimotoatricia notify [name] of patient[facility].   Assessment/plan status:  Psychosocial Support/Ongoing Assessment of Needs Other assessment/ plan:   Update FL2, Fax   Information/referral to community resources:   CSW contact information given to sister.    PATIENT'S/FAMILY'S RESPONSE TO PLAN OF CARE: Patient's [facility]. Patient's sister will work on reaching [place]et. Sister was pleasant, appropriate, and appreciative of CSW contact. CSW will assist with DC.       Roddie McBryant Daisy Mcneel, [place] Valley[place]CSWA, [place], [phone]

## 2013-03-15 NOTE — Discharge Summary (Signed)
Physician Discharge Summary  [name] [name] ZYS:[id number] DOB: 11/11/1970 DOA: 03/12/2013  PCP: Garwin Brothers, MD  Admit date: 03/12/2013 Discharge date: 03/15/2013  Time spent: 40 minutes  Recommendations for Outpatient Follow-up:  1. This patient will need continued careful monitoring of her respiratory status.  Discharge Diagnoses:  Principal Problem:   HCAP (healthcare-associated pneumonia) Active Problems:   Dementia   [name]   Dysphasia   SIRS (systemic inflammatory response syndrome)   Hyperkalemia   Healthcare associated bacterial pneumonia   Discharge Condition: stable  Diet recommendation: tube feeding as before, no change  Filed Weights   03/12/13 2339  Weight: 62.7 kg (138 lb 3.7 oz)    History of present illness:  43 year old female with history of [name], dementia, restless leg syndrome, depression, aphasia, dysphagia who was sent from skilled nursing facility for shortness of breath since today. History is limited as patient is nonverbal. Patient had the skilled nursing facility he was found to be short of breath and oxygen saturation dropped to 91% on room at which improved to 97% on 2 L when placed by EMS. Patient was noted to be tachypneic with a RR of 34 and coughing yellowish phlegm. Staff at the facility also reported that about 30 minutes after she was given 2 feeds she began to feel out in pain. No documented fever at the facility. No vomiting, chills, diarrhea or urinary symptoms or change in her mental status. She was admitted to the hospital one month back for dislodged tube which was replaced by IR.  Hospital Course:  1. Health care associated pneumonia: Treated inpatient with broad spectrum antibiotics vancomycin and cefepime, now stable and can transition to levaquin via feeding tube.  Afebrile throughout admission. WBC's initially elevated, now normal. On presentation was tachypneaic and hypoxic, now breathing  comfortably with 02 sats in high 90%.   2. Hyperkalmeia: resolved stable 3. PEG tube dependant: tube feeds continued without complication, no changes 4. Dementia:patient with Ivin Booty diagnosis.  Per family she was fully functioning until about 2009 when she developed psychiatric and neurologic disease. She is now non communicative and bed bound. No changes.   Procedures:  none  Consultations:  none  Discharge Exam: Filed Vitals:   03/15/13 0430  BP: 117/79  Pulse: 80  Temp: 98.4 F (36.9 C)  Resp: 20    General: opens eyes to voice. No distress. Cardiovascular: RRR no mrg Respiratory: shallow resps, good air movement. No wheezes, rhonchi, rales. No distress.  Discharge Instructions  Discharge Orders   Future Orders Complete By Expires   Call MD for:  difficulty breathing, headache or visual disturbances  As directed    Call MD for:  persistant dizziness or light-headedness  As directed    Call MD for:  temperature >100.4  As directed    Diet - low sodium heart healthy  As directed    Increase activity slowly  As directed        Medication List         bisacodyl 10 MG suppository  Commonly known as:  DULCOLAX  Place 10 mg rectally every other day as needed for moderate constipation.     cetirizine 10 MG tablet  Commonly known as:  ZYRTEC  Give 10 mg by tube daily.     feeding supplement (JEVITY 1.5 CAL) Liqd  Place 45 mL/hr into feeding tube continuous.     free water Soln  Place 150 mLs into feeding tube every 4 (four) hours.  furosemide 40 MG tablet  Commonly known as:  LASIX  40 mg by Gastric Tube route once.     guaiFENesin 100 MG/5ML Soln  Commonly known as:  ROBITUSSIN  20 mLs by Gastric Tube route every 6 (six) hours as needed (CONGESTION).     levofloxacin 25 MG/ML solution  Commonly known as:  LEVAQUIN  Take 20 mLs (500 mg total) by mouth daily.     meloxicam 7.5 MG tablet  Commonly known as:  MOBIC  Take 7.5 mg by mouth  daily.     miconazole 2 % cream  Commonly known as:  MICOTIN  Apply 1 application topically 2 (two) times daily. Apply to bilateral toes/toenails for foot infection for 4 weeks.     mupirocin ointment 2 %  Commonly known as:  BACTROBAN  Place 1 application into the nose 2 (two) times daily.     omeprazole 2 mg/mL Susp  Commonly known as:  PRILOSEC  Place 20 mg into feeding tube daily.     polyethylene glycol packet  Commonly known as:  MIRALAX / GLYCOLAX  Give 17 g by tube daily.     rOPINIRole 0.25 MG tablet  Commonly known as:  REQUIP  0.25 mg by PEG Tube route at bedtime.     SECURA EPC SKIN CARE STARTER 0.13 & 30.6 % Kit  Generic drug:  Benzethonium Cl & Zinc Oxide  Apply 1 application topically 3 (three) times daily as needed (excoriation).     urea 40 % ointment  Commonly known as:  CARMOL  Apply 1 application topically 2 (two) times daily. Apply to toenails on left foot 1st, 2nd, and 3rd digits.  Apply to right foot 1st and 2nd digits.     vitamin B-12 1000 MCG tablet  Commonly known as:  CYANOCOBALAMIN  1,000 mcg by PEG Tube route daily.       Allergies  Allergen Reactions  . Sulfa Antibiotics Other (See Comments)    ON MAR      The results of significant diagnostics from this hospitalization (including imaging, microbiology, ancillary and laboratory) are listed below for reference.    Significant Diagnostic Studies: Dg Chest 1 View  03/13/2013   CLINICAL DATA:  Pneumonia.  EXAM: CHEST - 1 VIEW  COMPARISON:  03/12/2013  FINDINGS: Lung volumes are reduced compared to the prior study with bibasilar atelectasis present. No significant airspace consolidation, edema or pleural fluid is identified. The heart size is normal.  IMPRESSION: Lower bilateral lung volumes with bibasilar atelectasis.   Electronically Signed   By: Aletta Edouard M.D.   On: 03/13/2013 09:16   Dg Chest Portable 1 View  03/12/2013   CLINICAL DATA:  Shortness of breath, history of MN  EXAM:  PORTABLE CHEST - 1 VIEW  COMPARISON:  None  FINDINGS: Grossly normal cardiac silhouette and mediastinal contours given decreased lung volumes and patient rotation. Bilateral perihilar and medial basilar heterogeneous possible airspace opacities. No definite pleural effusion pneumothorax. No evidence of edema. Grossly unchanged bones.  IMPRESSION: Decreased lung volumes with bilateral perihilar and medial basilar opacities - atelectasis versus infiltrate. Further evaluation with a PA and lateral chest radiograph may be obtained as clinically indicated.   Electronically Signed   By: Sandi Mariscal M.D.   On: 03/12/2013 20:46   Dg Abd Portable 1v  03/13/2013   CLINICAL DATA:  Small bowel obstruction.  EXAM: PORTABLE ABDOMEN - 1 VIEW  COMPARISON:  None.  FINDINGS: The bowel gas pattern is nonobstructive. A moderately  large volume of stool is seen throughout the colon. Feeding tube is noted.  IMPRESSION: No acute finding.  Moderately large stool burden.   Electronically Signed   By: Inge Rise M.D.   On: 03/13/2013 01:17    Microbiology: Recent Results (from the past 240 hour(s))  CULTURE, BLOOD (ROUTINE X 2)     Status: None   Collection Time    03/12/13 12:44 AM      Result Value Range Status   Specimen Description BLOOD LEFT HAND   Final   Special Requests BOTTLES DRAWN AEROBIC ONLY 5CC   Final   Culture  Setup Time     Final   Value: 03/13/2013 05:15     Performed at [facility]   Culture     Final   Value:        BLOOD CULTURE RECEIVED NO GROWTH TO DATE CULTURE WILL BE HELD FOR 5 DAYS BEFORE ISSUING A FINAL NEGATIVE REPORT     Performed at [facility]   Report Status PENDING   Incomplete  CULTURE, BLOOD (ROUTINE X 2)     Status: None   Collection Time    03/12/13 12:50 AM      Result Value Range Status   Specimen Description BLOOD LEFT HAND   Final   Special Requests BOTTLES DRAWN AEROBIC ONLY 5.5CC   Final   Culture  Setup Time     Final   Value: 03/13/2013 05:14      Performed at [facility]   Culture     Final   Value:        BLOOD CULTURE RECEIVED NO GROWTH TO DATE CULTURE WILL BE HELD FOR 5 DAYS BEFORE ISSUING A FINAL NEGATIVE REPORT     Performed at [facility]   Report Status PENDING   Incomplete  MRSA PCR SCREENING     Status: Abnormal   Collection Time    03/13/13 12:14 AM      Result Value Range Status   MRSA by PCR POSITIVE (*) NEGATIVE Final   Comment:            The GeneXpert MRSA Assay (FDA     approved for NASAL specimens     only), is one component of a     comprehensive MRSA colonization     surveillance program. It is not     intended to diagnose MRSA     infection nor to guide or     monitor treatment for     MRSA infections.     RESULT CALLED TO, READ BACK BY AND VERIFIED WITH:     CALLED TO RN VALERIE CONLEY [id number] '@[phone]'  THANEY     Labs: Basic Metabolic Panel:  Recent Labs Lab 03/12/13 1946 03/13/13 0050 03/13/13 0940 03/14/13 0335 03/15/13 0227  NA 142 143 143 143 143  K 5.7* 3.6* 4.1 3.4* 4.1  CL 102 104 105 106 104  CO2 '26 27 24 25 28  ' GLUCOSE 102* 111* 109* 145* 101*  BUN '14 14 14 11 6  ' CREATININE 0.66 0.72 0.68 0.83 0.68  CALCIUM 9.0 9.4 8.5 8.1* 8.7   Liver Function Tests:  Recent Labs Lab 03/12/13 1946  AST 63*  ALT 27  ALKPHOS 102  BILITOT 0.9  PROT 8.4*  ALBUMIN 3.5   No results found for this basename: LIPASE, AMYLASE,  in the last 168 hours No results found for this basename: AMMONIA,  in the last 168 hours CBC:  Recent Labs Lab 03/12/13 1944 03/13/13 0940 03/14/13 0335 03/15/13 0227  WBC 12.2* 14.1* 9.5 8.5  HGB 13.1 11.5* 10.2* 10.8*  HCT 39.4 35.2* 31.2* 34.2*  MCV 85.3 85.9 86.2 85.7  PLT 327 288 251 278   Cardiac Enzymes: No results found for this basename: CKTOTAL, CKMB, CKMBINDEX, TROPONINI,  in the last 168 hours BNP: BNP (last 3 results) No results found for this basename: PROBNP,  in the last 8760 hours CBG:  Recent Labs Lab 03/13/13 0842  03/13/13 1204 03/13/13 1701  GLUCAP 130* 116* 126*       Signed:  Dell Hurtubise  [facility] 03/15/2013, 12:52 PM

## 2013-03-15 NOTE — Clinical Social Work Note (Signed)
Per MD [facility]. RN, sister, and facility notified of DC. Number for report and evening ambulance number left on patient's DC packet with chart. RN will need to request ambulance transport. CSW signing off.   Roddie McBryant Irish Breisch, [place]CSWA, [place], [phone]

## 2013-03-19 LAB — CULTURE, BLOOD (ROUTINE X 2)
Culture: NO GROWTH
Culture: NO GROWTH

## 2013-10-17 ENCOUNTER — Inpatient Hospital Stay (HOSPITAL_COMMUNITY)
Admission: EM | Admit: 2013-10-17 | Discharge: 2013-10-20 | DRG: 872 | Disposition: A | Discharge status: Skilled Nursing Facility | Payer: PRIVATE HEALTH INSURANCE | Attending: Internal Medicine | Admitting: Internal Medicine

## 2013-10-17 ENCOUNTER — Emergency Department (HOSPITAL_COMMUNITY): Payer: PRIVATE HEALTH INSURANCE

## 2013-10-17 ENCOUNTER — Encounter (HOSPITAL_COMMUNITY): Payer: Self-pay | Admitting: Emergency Medicine

## 2013-10-17 DIAGNOSIS — G3109 Other frontotemporal dementia: Secondary | ICD-10-CM

## 2013-10-17 DIAGNOSIS — F0280 Dementia in other diseases classified elsewhere without behavioral disturbance: Secondary | ICD-10-CM | POA: Diagnosis present

## 2013-10-17 DIAGNOSIS — K9423 Gastrostomy malfunction: Secondary | ICD-10-CM

## 2013-10-17 DIAGNOSIS — I498 Other specified cardiac arrhythmias: Secondary | ICD-10-CM | POA: Diagnosis not present

## 2013-10-17 DIAGNOSIS — N390 Urinary tract infection, site not specified: Secondary | ICD-10-CM

## 2013-10-17 DIAGNOSIS — F3289 Other specified depressive episodes: Secondary | ICD-10-CM | POA: Diagnosis present

## 2013-10-17 DIAGNOSIS — F0390 Unspecified dementia without behavioral disturbance: Secondary | ICD-10-CM

## 2013-10-17 DIAGNOSIS — J9819 Other pulmonary collapse: Secondary | ICD-10-CM | POA: Diagnosis present

## 2013-10-17 DIAGNOSIS — K219 Gastro-esophageal reflux disease without esophagitis: Secondary | ICD-10-CM | POA: Diagnosis present

## 2013-10-17 DIAGNOSIS — Y833 Surgical operation with formation of external stoma as the cause of abnormal reaction of the patient, or of later complication, without mention of misadventure at the time of the procedure: Secondary | ICD-10-CM | POA: Diagnosis present

## 2013-10-17 DIAGNOSIS — Z79899 Other long term (current) drug therapy: Secondary | ICD-10-CM

## 2013-10-17 DIAGNOSIS — A8100 Creutzfeldt-Jakob disease, unspecified: Secondary | ICD-10-CM

## 2013-10-17 DIAGNOSIS — Z881 Allergy status to other antibiotic agents status: Secondary | ICD-10-CM

## 2013-10-17 DIAGNOSIS — R062 Wheezing: Secondary | ICD-10-CM

## 2013-10-17 DIAGNOSIS — G2581 Restless legs syndrome: Secondary | ICD-10-CM | POA: Diagnosis present

## 2013-10-17 DIAGNOSIS — F329 Major depressive disorder, single episode, unspecified: Secondary | ICD-10-CM | POA: Diagnosis present

## 2013-10-17 DIAGNOSIS — R000 Tachycardia, unspecified: Secondary | ICD-10-CM

## 2013-10-17 DIAGNOSIS — R7881 Bacteremia: Secondary | ICD-10-CM

## 2013-10-17 DIAGNOSIS — N210 Calculus in bladder: Secondary | ICD-10-CM | POA: Diagnosis present

## 2013-10-17 DIAGNOSIS — R1310 Dysphagia, unspecified: Secondary | ICD-10-CM

## 2013-10-17 DIAGNOSIS — A419 Sepsis, unspecified organism: Secondary | ICD-10-CM | POA: Diagnosis not present

## 2013-10-17 DIAGNOSIS — R6510 Systemic inflammatory response syndrome (SIRS) of non-infectious origin without acute organ dysfunction: Secondary | ICD-10-CM

## 2013-10-17 LAB — CBC WITH DIFFERENTIAL/PLATELET
Basophils Absolute: 0 10*3/uL (ref 0.0–0.1)
Basophils Relative: 0 % (ref 0–1)
EOS PCT: 4 % (ref 0–5)
Eosinophils Absolute: 0.5 10*3/uL (ref 0.0–0.7)
HEMATOCRIT: 39.5 % (ref 36.0–46.0)
Hemoglobin: 12.7 g/dL (ref 12.0–15.0)
LYMPHS ABS: 2.3 10*3/uL (ref 0.7–4.0)
LYMPHS PCT: 16 % (ref 12–46)
MCH: 27.3 pg (ref 26.0–34.0)
MCHC: 32.2 g/dL (ref 30.0–36.0)
MCV: 84.9 fL (ref 78.0–100.0)
Monocytes Absolute: 0.6 10*3/uL (ref 0.1–1.0)
Monocytes Relative: 4 % (ref 3–12)
Neutro Abs: 11.2 10*3/uL — ABNORMAL HIGH (ref 1.7–7.7)
Neutrophils Relative %: 76 % (ref 43–77)
PLATELETS: 337 10*3/uL (ref 150–400)
RBC: 4.65 MIL/uL (ref 3.87–5.11)
RDW: 15.2 % (ref 11.5–15.5)
WBC: 14.7 10*3/uL — AB (ref 4.0–10.5)

## 2013-10-17 LAB — COMPREHENSIVE METABOLIC PANEL
ALK PHOS: 147 U/L — AB (ref 39–117)
ALT: 20 U/L (ref 0–35)
AST: 23 U/L (ref 0–37)
Albumin: 3.5 g/dL (ref 3.5–5.2)
Anion gap: 13 (ref 5–15)
BUN: 14 mg/dL (ref 6–23)
CALCIUM: 8.9 mg/dL (ref 8.4–10.5)
CHLORIDE: 105 meq/L (ref 96–112)
CO2: 23 meq/L (ref 19–32)
Creatinine, Ser: 0.67 mg/dL (ref 0.50–1.10)
GFR calc Af Amer: >90 mL/min (ref >90)
GLUCOSE: 108 mg/dL — AB (ref 70–99)
Potassium: 4 mEq/L (ref 3.7–5.3)
SODIUM: 141 meq/L (ref 137–147)
Total Bilirubin: 0.4 mg/dL (ref 0.3–1.2)
Total Protein: 7.7 g/dL (ref 6.0–8.3)

## 2013-10-17 LAB — I-STAT CG4 LACTIC ACID, ED: Lactic Acid, Venous: 1.86 mmol/L (ref 0.5–2.2)

## 2013-10-17 LAB — URINALYSIS, ROUTINE W REFLEX MICROSCOPIC
Bilirubin Urine: NEGATIVE
GLUCOSE, UA: NEGATIVE mg/dL
HGB URINE DIPSTICK: NEGATIVE
KETONES UR: NEGATIVE mg/dL
Nitrite: POSITIVE — AB
Protein, ur: NEGATIVE mg/dL
Specific Gravity, Urine: 1.019 (ref 1.005–1.030)
Urobilinogen, UA: 1 mg/dL (ref 0.0–1.0)
pH: 8 (ref 5.0–8.0)

## 2013-10-17 LAB — URINE MICROSCOPIC-ADD ON

## 2013-10-17 MED ORDER — SODIUM CHLORIDE 0.9 % IV SOLN: INTRAVENOUS | Status: AC

## 2013-10-17 MED ORDER — ALBUTEROL SULFATE (2.5 MG/3ML) 0.083% IN NEBU
2.5000 mg | INHALATION_SOLUTION | RESPIRATORY_TRACT | Status: DC | PRN
  Administered 2013-10-19: 2.5 mg via RESPIRATORY_TRACT
  Filled 2013-10-17: qty 3

## 2013-10-17 MED ORDER — SODIUM CHLORIDE 0.9 % IJ SOLN
3.0000 mL | Freq: Two times a day (BID) | INTRAMUSCULAR | Status: DC
  Administered 2013-10-19 (×3): 3 mL via INTRAVENOUS

## 2013-10-17 MED ORDER — ALUM & MAG HYDROXIDE-SIMETH 200-200-20 MG/5ML PO SUSP: 30.0000 mL | Freq: Four times a day (QID) | ORAL | Status: DC | PRN

## 2013-10-17 MED ORDER — BACLOFEN 10 MG PO TABS
10.0000 mg | ORAL_TABLET | Freq: Three times a day (TID) | ORAL | Status: DC
  Filled 2013-10-17 (×4): qty 1

## 2013-10-17 MED ORDER — IOHEXOL 300 MG/ML  SOLN
100.0000 mL | Freq: Once | INTRAMUSCULAR | Status: AC | PRN
  Administered 2013-10-17: 100 mL via INTRAVENOUS

## 2013-10-17 MED ORDER — SODIUM CHLORIDE 0.9 % IV SOLN
INTRAVENOUS | Status: DC
  Administered 2013-10-18: via INTRAVENOUS
  Administered 2013-10-18: 1000 mL via INTRAVENOUS
  Administered 2013-10-19: 05:00:00 via INTRAVENOUS

## 2013-10-17 MED ORDER — ACETAMINOPHEN 650 MG RE SUPP
650.0000 mg | Freq: Four times a day (QID) | RECTAL | Status: DC | PRN
Start: 2013-10-17 — End: 2013-10-20

## 2013-10-17 MED ORDER — JEVITY 1.5 CAL PO LIQD: 50.0000 mL/h | ORAL | Status: DC

## 2013-10-17 MED ORDER — MORPHINE SULFATE 4 MG/ML IJ SOLN
4.0000 mg | Freq: Once | INTRAMUSCULAR | Status: AC
  Administered 2013-10-17: 4 mg via INTRAVENOUS
  Filled 2013-10-17: qty 1

## 2013-10-17 MED ORDER — ROPINIROLE HCL 0.25 MG PO TABS
0.2500 mg | ORAL_TABLET | Freq: Every day | ORAL | Status: DC
  Filled 2013-10-17 (×2): qty 1

## 2013-10-17 MED ORDER — MELOXICAM 7.5 MG PO TABS
7.5000 mg | ORAL_TABLET | Freq: Every day | ORAL | Status: DC
  Filled 2013-10-17: qty 1

## 2013-10-17 MED ORDER — SODIUM CHLORIDE 0.9 % IV BOLUS (SEPSIS)
1000.0000 mL | Freq: Once | INTRAVENOUS | Status: AC
  Administered 2013-10-17: 1000 mL via INTRAVENOUS

## 2013-10-17 MED ORDER — BISACODYL 10 MG RE SUPP: 10.0000 mg | RECTAL | Status: DC | PRN

## 2013-10-17 MED ORDER — DEXTROSE 5 % IV SOLN
1.0000 g | INTRAVENOUS | Status: DC
  Administered 2013-10-18 – 2013-10-19 (×2): 1 g via INTRAVENOUS
  Filled 2013-10-17 (×3): qty 10

## 2013-10-17 MED ORDER — ACETAMINOPHEN 325 MG PO TABS: 650.0000 mg | ORAL_TABLET | Freq: Four times a day (QID) | ORAL | Status: DC | PRN

## 2013-10-17 MED ORDER — VITAMIN B-12 1000 MCG PO TABS
1000.0000 ug | ORAL_TABLET | Freq: Every day | ORAL | Status: DC
  Filled 2013-10-17: qty 1

## 2013-10-17 MED ORDER — FREE WATER: 150.0000 mL | Status: DC

## 2013-10-17 MED ORDER — ENOXAPARIN SODIUM 40 MG/0.4ML ~~LOC~~ SOLN
40.0000 mg | SUBCUTANEOUS | Status: DC
  Administered 2013-10-18: 40 mg via SUBCUTANEOUS
  Filled 2013-10-17 (×2): qty 0.4

## 2013-10-17 MED ORDER — HYDROMORPHONE HCL PF 1 MG/ML IJ SOLN
0.5000 mg | INTRAMUSCULAR | Status: DC | PRN
  Administered 2013-10-18: 1 mg via INTRAVENOUS
  Filled 2013-10-17: qty 1

## 2013-10-17 MED ORDER — SODIUM CHLORIDE 0.9 % IV BOLUS (SEPSIS)
30.0000 mL/kg | Freq: Once | INTRAVENOUS | Status: AC
  Administered 2013-10-17: 1878 mL via INTRAVENOUS

## 2013-10-17 MED ORDER — ONDANSETRON HCL 4 MG/2ML IJ SOLN
4.0000 mg | Freq: Once | INTRAMUSCULAR | Status: AC
  Administered 2013-10-17: 4 mg via INTRAVENOUS
  Filled 2013-10-17: qty 2

## 2013-10-17 MED ORDER — LORATADINE 10 MG PO TABS
10.0000 mg | ORAL_TABLET | Freq: Every day | ORAL | Status: DC
  Filled 2013-10-17: qty 1

## 2013-10-17 MED ORDER — OXYCODONE HCL 5 MG PO TABS: 5.0000 mg | ORAL_TABLET | ORAL | Status: DC | PRN

## 2013-10-17 MED ORDER — CEFTRIAXONE SODIUM 1 G IJ SOLR
1.0000 g | Freq: Once | INTRAMUSCULAR | Status: AC
  Administered 2013-10-17: 1 g via INTRAVENOUS
  Filled 2013-10-17: qty 10

## 2013-10-17 MED ORDER — OMEPRAZOLE 2 MG/ML ORAL SUSPENSION
20.0000 mg | Freq: Two times a day (BID) | ORAL | Status: DC
  Filled 2013-10-17: qty 10

## 2013-10-17 MED ORDER — GUAIFENESIN 100 MG/5ML PO SOLN
20.0000 mL | Freq: Four times a day (QID) | ORAL | Status: DC | PRN
  Filled 2013-10-17: qty 20

## 2013-10-17 MED ORDER — IOHEXOL 300 MG/ML  SOLN: 50.0000 mL | Freq: Once | INTRAMUSCULAR | Status: AC | PRN

## 2013-10-17 MED ORDER — ONDANSETRON HCL 4 MG/2ML IJ SOLN
4.0000 mg | Freq: Four times a day (QID) | INTRAMUSCULAR | Status: DC | PRN
  Filled 2013-10-17: qty 2

## 2013-10-17 MED ORDER — ONDANSETRON HCL 4 MG PO TABS: 4.0000 mg | ORAL_TABLET | Freq: Four times a day (QID) | ORAL | Status: DC | PRN

## 2013-10-17 MED ORDER — POLYETHYLENE GLYCOL 3350 17 G PO PACK
17.0000 g | PACK | Freq: Every day | ORAL | Status: DC
  Filled 2013-10-17: qty 1

## 2013-10-17 MED ORDER — ALBUTEROL SULFATE (2.5 MG/3ML) 0.083% IN NEBU: 2.5000 mg | INHALATION_SOLUTION | Freq: Four times a day (QID) | RESPIRATORY_TRACT | Status: DC

## 2013-10-17 MED ORDER — SODIUM CHLORIDE 0.9 % IV SOLN
1000.0000 mL | INTRAVENOUS | Status: DC
  Administered 2013-10-17: 1000 mL via INTRAVENOUS

## 2013-10-17 MED ORDER — IOHEXOL 300 MG/ML  SOLN
25.0000 mL | INTRAMUSCULAR | Status: AC
  Administered 2013-10-17: 25 mL via ORAL

## 2013-10-17 NOTE — H&P (Signed)
[facility] Admission History and Physical       [name] [name] ZOX:[id number] DOB: 12-Aug-1970 DOA: 10/17/2013  Referring physician:  EDP PCP: [name], MD  Specialists:   Chief Complaint:  PEG Tube Malfunction, and Tachycardia  HPI: [name] [name] is a 43 y.o. female from area SNF with history of Cruzfeld [name] Disease who was sent to the ED due to PEG tube problems and had PEG Tube Replacement in the ED and was found to have persistent tachycardia, and a UTI and was retained for admission.  Urine Cu;tures were sent and she was placed on IV Rocephin.    Review of Systems:  Unable to Obtain from the Patient   Past Medical History  Diagnosis Date  . Dementia   . Restless leg syndrome   . Esophageal reflux   . UTI (urinary tract infection)   . Depression   . Pneumonia   . Constipation   . Aphagia   . Bacteremia   . Sepsis   . [name] disease   . Vitamin B12 deficiency     Past Surgical History  Procedure Laterality Date  . Peg tube placement      16 [place]  . Cholecystectomy       Prior to Admission medications   Medication Sig Start Date End Date Taking? Authorizing Provider  baclofen (LIORESAL) 10 MG tablet Place 10 mg into feeding tube 3 (three) times daily.   Yes Historical Provider, MD  bisacodyl (DULCOLAX) 10 MG suppository Place 10 mg rectally every other day as needed for moderate constipation.   Yes Historical Provider, MD  cetirizine (ZYRTEC) 10 MG tablet Give 10 mg by tube daily.   Yes Historical Provider, MD  Cholecalciferol (VITAMIN D-3) 5000 UNITS TABS Take 5,000 Units by mouth daily. On Monday and Saturday per g-tube   Yes Historical Provider, MD  guaiFENesin (ROBITUSSIN) 100 MG/5ML SOLN 20 mLs by Gastric Tube route every 6 (six) hours as needed (CONGESTION).   Yes Historical Provider, MD  meloxicam (MOBIC) 7.5 MG tablet Take 7.5 mg by mouth daily.   Yes Historical Provider, MD  Nutritional Supplements (FEEDING SUPPLEMENT, JEVITY 1.5  CAL,) LIQD Place 50 mL/hr into feeding tube daily.    Yes Historical Provider, MD  omeprazole (PRILOSEC) 2 mg/mL SUSP Place 20 mg into feeding tube 2 (two) times daily.    Yes Historical Provider, MD  polyethylene glycol (MIRALAX / GLYCOLAX) packet Give 17 g by tube daily.   Yes Historical Provider, MD  rOPINIRole (REQUIP) 0.25 MG tablet 0.25 mg by PEG Tube route at bedtime.    Yes Historical Provider, MD  vitamin B-12 (CYANOCOBALAMIN) 1000 MCG tablet 1,000 mcg by PEG Tube route daily.   Yes Historical Provider, MD  Water For Irrigation, Sterile (FREE WATER) SOLN Place 150 mLs into feeding tube every 4 (four) hours.   Yes Historical Provider, MD      Allergies  Allergen Reactions  . Sulfa Antibiotics Other (See Comments)    ON MAR     Social History:  reports that she has never smoked. She does not have any smokeless tobacco history on file. She reports that she does not drink alcohol or use illicit drugs.     History reviewed. No pertinent family history.     Physical Exam:  GEN:  Bedbound,  43 y.o.  [place] female examined and in no acute distress; cooperative with exam Filed Vitals:   10/17/13 2000 10/17/13 2200 10/17/13 2249 10/17/13 2300  BP: 140/88 141/96  147/101 137/99  Pulse: 119 120 118 116  Temp:      TempSrc:      Resp:  41 42 39  Weight:      SpO2: 99% 98% 100% 97%   Blood pressure 137/99, pulse 116, temperature 99.7 F (37.6 C), temperature source Rectal, resp. rate 39, weight 62.596 kg (138 lb), SpO2 97.00%. PSYCH: She is alert and oriented x 0;   HEENT: Normocephalic and Atraumatic, Mucous membranes pink; PERRLA; EOM intact; Fundi:  Benign;  No scleral icterus, Nares: Patent, Oropharynx: Clear, Fair Dentition,    Neck:  FROM, No Cervical Lymphadenopathy nor Thyromegaly or Carotid Bruit; No JVD; Breasts:: Not examined CHEST WALL: No tenderness CHEST: Normal respiration, clear to auscultation bilaterally HEART: Regular rate and rhythm; no murmurs rubs  or gallops BACK: No kyphosis or scoliosis; No CVA tenderness ABDOMEN: Positive Bowel Sounds, PEG tube Present,  Soft Non-Tender; No Masses, No Organomegaly, . Rectal Exam: Not done EXTREMITIES: No Cyanosis, Clubbing, or Edema; No Ulcerations. Genitalia: not examined PULSES: 2+ and symmetric SKIN: Normal hydration no rash or ulceration CNS:  A x O x 0,   Contractures x 4 Exts Vascular: pulses palpable throughout    Labs on Admission:  Basic Metabolic Panel:  Recent Labs Lab 10/17/13 2020  NA 141  K 4.0  CL 105  CO2 23  GLUCOSE 108*  BUN 14  CREATININE 0.67  CALCIUM 8.9   Liver Function Tests:  Recent Labs Lab 10/17/13 2020  AST 23  ALT 20  ALKPHOS 147*  BILITOT 0.4  PROT 7.7  ALBUMIN 3.5   No results found for this basename: LIPASE, AMYLASE,  in the last 168 hours No results found for this basename: AMMONIA,  in the last 168 hours CBC:  Recent Labs Lab 10/17/13 2020  WBC 14.7*  NEUTROABS 11.2*  HGB 12.7  HCT 39.5  MCV 84.9  PLT 337   Cardiac Enzymes: No results found for this basename: CKTOTAL, CKMB, CKMBINDEX, TROPONINI,  in the last 168 hours  BNP (last 3 results) No results found for this basename: PROBNP,  in the last 8760 hours CBG: No results found for this basename: GLUCAP,  in the last 168 hours  Radiological Exams on Admission: Ct Abdomen Pelvis W Contrast  10/17/2013   CLINICAL DATA:  Abdominal pain.  EXAM: CT ABDOMEN AND PELVIS WITH CONTRAST  TECHNIQUE: Multidetector CT imaging of the abdomen and pelvis was performed using the standard protocol following bolus administration of intravenous contrast.  CONTRAST:  OMNIPAQUE IOHEXOL 300 MG/ML  SOLN  COMPARISON:  None.  FINDINGS: Lung bases:  Images are degraded by breathing motion artifact. Dependent atelectasis is noted. No definite pleural effusion.  The solid abdominal organs are grossly normal despite motion artifact. No obvious inflammatory changes or mass lesions. No renal or  obstructing ureteral calculi. The gallbladder is surgically absent. No common bowel duct dilatation.  The feeding gastrostomy tube is in good position. No complicating features the duodenum, small bowel and colon are unremarkable. No inflammatory changes, mass lesions or obstructive findings. The appendix is normal. No mesenteric or retroperitoneal mass or adenopathy. The aorta and branch vessels are patent. The major venous structures are patent.  CT pelvis:  The uterus and ovaries are unremarkable. The bladder appears normal. No pelvic mass, adenopathy or free pelvic fluid collections. No inguinal mass or adenopathy. There is a radiodensity noted in the bladder. This appears to be a foreign body and not a bladder calculus.  Bony structures:  No significant findings.  IMPRESSION: 1. Feeding gastrostomy tube is in good position without complicating features. No findings for small bowel obstruction. 2. Radiopaque foreign body in the bladder. Alternatively it could be an unusual bladder calculus. 3. No acute abdominal/pelvic findings, mass lesions or adenopathy.   Electronically Signed   By: Loralie ChampagneMark  Gallerani M.D.   On: 10/17/2013 22:49   Dg Chest Port 1 View  10/17/2013   CLINICAL DATA:  Peg tube displacement.  EXAM: PORTABLE CHEST - 1 VIEW  COMPARISON:  03/13/2013  FINDINGS: Heart size and pulmonary vascularity are normal and the lungs are clear. Markings are slightly accentuated on the left due to the shallow inspiration. No osseous abnormality.  IMPRESSION: No acute abnormalities.   Electronically Signed   By: Geanie CooleyJim  Maxwell M.D.   On: 10/17/2013 19:23   Dg Abd Portable 1v  10/17/2013   CLINICAL DATA:  G-tube placement.  EXAM: PORTABLE ABDOMEN - 1 VIEW  COMPARISON:  03/13/2013  FINDINGS: The feeding gastrostomy tube appears to be in good position. There is contrast in the stomach and duodenal bulb. No extravasating contrast. The bowel gas pattern is unremarkable. The lung bases are grossly clear.  IMPRESSION:  Feeding gastrostomy tube in good position without complicating features.   Electronically Signed   By: Loralie ChampagneMark  Gallerani M.D.   On: 10/17/2013 19:31      Assessment/Plan:   43 y.o. female with  Principal Problem:   1.   SIRS (systemic inflammatory response syndrome)/ UTI   Blood and Urine Cultures sent   IV Rocephin   IVFs   Active Problems:   2.   Sinus tachycardia- Due to #1   IVFs   Monitor RAte andn Rhythm   Check TSH and Free T4      3.   Dementia   Due to #4    4.   Lenise ArenaCreutzfeldt Jakob disease   Chronic    5.   Dysphagia- NPO   PEG Tube Feedings    6.   PEG tube malfunction   PEG tube Replaced and Functioning   Resume Per PEG Feeding and Meds    7.   DVT Prophylaxis   Lovenox    Code Status:   FULL CODE Family Communication:    No Family Present Disposition Plan:    Inpatient/Observation     Time spent:  60 Minutes  Ron ParkerJENKINS,Maci Eickholt C [facility] Pager [phone]   If 7AM -7PM Please Contact the Day Rounding Team MD for [facility]  If 7PM-7AM, Please Contact night-coverage  [email or web address] Password [email or web address] 10/17/2013, 11:46 PM

## 2013-10-17 NOTE — ED Provider Notes (Signed)
CSN: [id number]     Arrival date & time 10/17/13  1805 History   First MD Initiated Contact with Patient 10/17/13 1820     Chief Complaint  Patient presents with  . GI Problem     (Consider location/radiation/quality/duration/timing/severity/associated sxs/prior Treatment) Patient is a 43 y.o. female presenting with general illness. The history is provided by the patient.  Illness Severity:  Moderate Onset quality:  Sudden Duration:  2 days Timing:  Constant Progression:  Unchanged Chronicity:  Recurrent  43 yo F with a chief complaint of a G-tube problem. G-tube popped out after they felt maybe she was overfed at the [facility]. G tube brought alongside patient with a ruptured baloon.  Patient tachycardic on arrival rectal temperature 99.7. Tachycardic into the 110s. 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   Past Medical History  Diagnosis Date  . Dementia   . Restless leg syndrome   . Esophageal reflux   . UTI (urinary tract infection)   . Depression   . Pneumonia   . Constipation   . Aphagia   . Bacteremia   . Sepsis   . Lenise ArenaCreutzfeldt Jakob disease   . Vitamin B12 deficiency    Past Surgical History  Procedure Laterality Date  . Peg tube placement      16 [place]  . Cholecystectomy     History reviewed. No pertinent family history. History  Substance Use Topics  . Smoking status: Never Smoker   . Smokeless tobacco: Not on file  . Alcohol Use: No   OB History   Grav Para Term Preterm Abortions TAB SAB Ect Mult Living                 Review of Systems  Unable to perform ROS: Patient nonverbal      Allergies  Sulfa antibiotics  Home Medications   Prior to Admission medications   Medication Sig Start Date End Date Taking? Authorizing Provider  baclofen (LIORESAL) 10 MG tablet Place 10 mg into feeding tube 3 (three) times daily.   Yes Historical Provider, MD  bisacodyl (DULCOLAX) 10 MG suppository Place 10 mg rectally every other day as needed for moderate constipation.    Yes Historical Provider, MD  cetirizine (ZYRTEC) 10 MG tablet Give 10 mg by tube daily.   Yes Historical Provider, MD  Cholecalciferol (VITAMIN D-3) 5000 UNITS TABS Take 5,000 Units by mouth daily. On Monday and Saturday per g-tube   Yes Historical Provider, MD  guaiFENesin (ROBITUSSIN) 100 MG/5ML SOLN 20 mLs by Gastric Tube route every 6 (six) hours as needed (CONGESTION).   Yes Historical Provider, MD  meloxicam (MOBIC) 7.5 MG tablet Take 7.5 mg by mouth daily.   Yes Historical Provider, MD  Nutritional Supplements (FEEDING SUPPLEMENT, JEVITY 1.5 CAL,) LIQD Place 50 mL/hr into feeding tube daily.    Yes Historical Provider, MD  omeprazole (PRILOSEC) 2 mg/mL SUSP Place 20 mg into feeding tube 2 (two) times daily.    Yes Historical Provider, MD  polyethylene glycol (MIRALAX / GLYCOLAX) packet Give 17 g by tube daily.   Yes Historical Provider, MD  rOPINIRole (REQUIP) 0.25 MG tablet 0.25 mg by PEG Tube route at bedtime.    Yes Historical Provider, MD  vitamin B-12 (CYANOCOBALAMIN) 1000 MCG tablet 1,000 mcg by PEG Tube route daily.   Yes Historical Provider, MD  Water For Irrigation, Sterile (FREE WATER) SOLN Place 150 mLs into feeding tube every 4 (four) hours.   Yes Historical Provider, MD   BP 128/85  Pulse 107  Temp(Src) 99.7 F (37.6 C) (Rectal)  Resp 16  Wt 138 lb (62.596 kg)  SpO2 98% Physical Exam  Constitutional: She appears distressed.  HENT:  Head: Normocephalic and atraumatic.  Eyes: EOM are normal. Pupils are equal, round, and reactive to light.  Neck: Normal range of motion. Neck supple.  Cardiovascular: Normal rate and regular rhythm.  Exam reveals no gallop and no friction rub.   No murmur heard. Pulmonary/Chest: Effort normal. She has no wheezes. She has no rales.  Abdominal: Soft. She exhibits no distension. There is tenderness (diffuse). There is guarding.  Musculoskeletal: She exhibits no edema and no tenderness.  Contractures in all four extremities  Neurological:  She is alert.  Skin: Skin is warm. She is diaphoretic.  Psychiatric: She has a normal mood and affect. Her behavior is normal.    ED Course  Procedures (including critical care time) Labs Review Labs Reviewed  CULTURE, BLOOD (ROUTINE X 2)  CULTURE, BLOOD (ROUTINE X 2)  URINE CULTURE  CBC WITH DIFFERENTIAL  COMPREHENSIVE METABOLIC PANEL  URINALYSIS, ROUTINE W REFLEX MICROSCOPIC  I-STAT CG4 LACTIC ACID, ED    Imaging Review No results found.   EKG Interpretation None     Procedure note: Ultrasound Guided Peripheral IV Ultrasound guided 18 g peripheral 1.88 inch angiocath IV placement performed by me. Indications: Nursing unable to place IV. Details: The right antecubital fossa and upper arm was evaluated with a multifrequency linear probe. Several patent brachial veins are noted. 1 attempts were made to cannulate a Right vein under realtime US guidance with successful cannulation of the vein and catheter placement. There is return of non-pulsatile dark red blood. The patient tolerated the procedure well without complications. An ultrasound image is not archived.  MDM   Final diagnoses:  None    43 yo F  With a chief complaint of a G-tube problem placed Foley while waiting G-tube. G-tube placed without difficulty. We'll obtain a KUB to verify placement. Patient diaphoretic tachycardic and warm to touch on arrival will obtain a septic workup.  UTI on UA will treat with ceftriaxone.   Patient with continuing tachycardia post 30 cc per kilo fluid bolus. Patient with continued abdominal tenderness on exam. We will CT abdomen and pelvis.  CT pelvis unremarkable with the exception of a possible foreign body in the bladder.  Patient with persistent tachycardia will admit to the hospital.  Melene Plan, MD 10/18/13 [phone]

## 2013-10-17 NOTE — ED Notes (Signed)
Pt is at her baseline nonverbal, [facility] after her peg tube "popped" and she was overfed. Pt has been moaning today -- presumably from abdominal pain.

## 2013-10-17 NOTE — Progress Notes (Signed)
Received report from Eric, RN.

## 2013-10-17 NOTE — ED Notes (Signed)
Pt back from CT

## 2013-10-17 NOTE — ED Notes (Signed)
Transporting patient to new room assignment. 

## 2013-10-17 NOTE — ED Notes (Signed)
Patient transported to CT 

## 2013-10-17 NOTE — ED Notes (Signed)
I Stat Lactic Acid results shown to Dr. D. Floyd 

## 2013-10-18 ENCOUNTER — Inpatient Hospital Stay (HOSPITAL_COMMUNITY): Payer: PRIVATE HEALTH INSURANCE

## 2013-10-18 DIAGNOSIS — Y833 Surgical operation with formation of external stoma as the cause of abnormal reaction of the patient, or of later complication, without mention of misadventure at the time of the procedure: Secondary | ICD-10-CM | POA: Diagnosis present

## 2013-10-18 DIAGNOSIS — G2581 Restless legs syndrome: Secondary | ICD-10-CM | POA: Diagnosis present

## 2013-10-18 DIAGNOSIS — K219 Gastro-esophageal reflux disease without esophagitis: Secondary | ICD-10-CM | POA: Diagnosis present

## 2013-10-18 DIAGNOSIS — F3289 Other specified depressive episodes: Secondary | ICD-10-CM | POA: Diagnosis present

## 2013-10-18 DIAGNOSIS — A8100 Creutzfeldt-Jakob disease, unspecified: Secondary | ICD-10-CM | POA: Diagnosis present

## 2013-10-18 DIAGNOSIS — J9819 Other pulmonary collapse: Secondary | ICD-10-CM | POA: Diagnosis present

## 2013-10-18 DIAGNOSIS — A419 Sepsis, unspecified organism: Secondary | ICD-10-CM | POA: Diagnosis present

## 2013-10-18 DIAGNOSIS — Z79899 Other long term (current) drug therapy: Secondary | ICD-10-CM | POA: Diagnosis not present

## 2013-10-18 DIAGNOSIS — I498 Other specified cardiac arrhythmias: Secondary | ICD-10-CM | POA: Diagnosis present

## 2013-10-18 DIAGNOSIS — R1310 Dysphagia, unspecified: Secondary | ICD-10-CM | POA: Diagnosis present

## 2013-10-18 DIAGNOSIS — F329 Major depressive disorder, single episode, unspecified: Secondary | ICD-10-CM | POA: Diagnosis present

## 2013-10-18 DIAGNOSIS — F0280 Dementia in other diseases classified elsewhere without behavioral disturbance: Secondary | ICD-10-CM | POA: Diagnosis present

## 2013-10-18 DIAGNOSIS — N390 Urinary tract infection, site not specified: Secondary | ICD-10-CM | POA: Diagnosis present

## 2013-10-18 DIAGNOSIS — K9423 Gastrostomy malfunction: Secondary | ICD-10-CM | POA: Diagnosis present

## 2013-10-18 DIAGNOSIS — Z881 Allergy status to other antibiotic agents status: Secondary | ICD-10-CM | POA: Diagnosis not present

## 2013-10-18 DIAGNOSIS — R6510 Systemic inflammatory response syndrome (SIRS) of non-infectious origin without acute organ dysfunction: Secondary | ICD-10-CM | POA: Diagnosis present

## 2013-10-18 DIAGNOSIS — N210 Calculus in bladder: Secondary | ICD-10-CM | POA: Diagnosis present

## 2013-10-18 LAB — CBC
HCT: 37.8 % (ref 36.0–46.0)
Hemoglobin: 11.9 g/dL — ABNORMAL LOW (ref 12.0–15.0)
MCH: 26.8 pg (ref 26.0–34.0)
MCHC: 31.5 g/dL (ref 30.0–36.0)
MCV: 85.1 fL (ref 78.0–100.0)
Platelets: 297 10*3/uL (ref 150–400)
RBC: 4.44 MIL/uL (ref 3.87–5.11)
RDW: 15.2 % (ref 11.5–15.5)
WBC: 17.5 10*3/uL — AB (ref 4.0–10.5)

## 2013-10-18 LAB — BASIC METABOLIC PANEL
ANION GAP: 13 (ref 5–15)
BUN: 10 mg/dL (ref 6–23)
CHLORIDE: 106 meq/L (ref 96–112)
CO2: 23 meq/L (ref 19–32)
CREATININE: 0.61 mg/dL (ref 0.50–1.10)
Calcium: 8.4 mg/dL (ref 8.4–10.5)
GFR calc non Af Amer: >90 mL/min (ref >90)
Glucose, Bld: 114 mg/dL — ABNORMAL HIGH (ref 70–99)
POTASSIUM: 4.4 meq/L (ref 3.7–5.3)
Sodium: 142 mEq/L (ref 137–147)

## 2013-10-18 LAB — GLUCOSE, CAPILLARY
GLUCOSE-CAPILLARY: 112 mg/dL — AB (ref 70–99)
Glucose-Capillary: 108 mg/dL — ABNORMAL HIGH (ref 70–99)
Glucose-Capillary: 98 mg/dL (ref 70–99)
Glucose-Capillary: 89 mg/dL (ref 70–99)
Glucose-Capillary: 109 mg/dL — ABNORMAL HIGH (ref 70–99)

## 2013-10-18 LAB — T4, FREE: FREE T4: 1.4 ng/dL (ref 0.80–1.80)

## 2013-10-18 LAB — MRSA PCR SCREENING

## 2013-10-18 LAB — TSH: TSH: 1.34 u[IU]/mL (ref 0.350–4.500)

## 2013-10-18 MED ORDER — OMEPRAZOLE 2 MG/ML ORAL SUSPENSION: 20.0000 mg | Freq: Two times a day (BID) | ORAL | Status: DC

## 2013-10-18 MED ORDER — ALBUTEROL SULFATE (2.5 MG/3ML) 0.083% IN NEBU
2.5000 mg | INHALATION_SOLUTION | Freq: Four times a day (QID) | RESPIRATORY_TRACT | Status: DC
  Administered 2013-10-18 – 2013-10-20 (×7): 2.5 mg via RESPIRATORY_TRACT
  Filled 2013-10-18 (×8): qty 3

## 2013-10-18 MED ORDER — BACLOFEN 10 MG PO TABS
10.0000 mg | ORAL_TABLET | Freq: Three times a day (TID) | ORAL | Status: DC
  Administered 2013-10-19 – 2013-10-20 (×5): 10 mg
  Filled 2013-10-18 (×7): qty 1

## 2013-10-18 MED ORDER — JEVITY 1.5 CAL/FIBER PO LIQD
1000.0000 mL | ORAL | Status: DC
  Filled 2013-10-18 (×3): qty 1000

## 2013-10-18 MED ORDER — JEVITY 1.5 CAL PO LIQD: 50.0000 mL/h | ORAL | Status: DC

## 2013-10-18 MED ORDER — CETYLPYRIDINIUM CHLORIDE 0.05 % MT LIQD
7.0000 mL | Freq: Two times a day (BID) | OROMUCOSAL | Status: DC
  Administered 2013-10-19 – 2013-10-20 (×3): 7 mL via OROMUCOSAL

## 2013-10-18 MED ORDER — JEVITY 1.5 CAL/FIBER PO LIQD
1000.0000 mL | ORAL | Status: DC
  Administered 2013-10-18 – 2013-10-19 (×2): 1000 mL
  Filled 2013-10-18 (×4): qty 1000

## 2013-10-18 MED ORDER — VANCOMYCIN HCL IN DEXTROSE 750-5 MG/150ML-% IV SOLN
750.0000 mg | Freq: Three times a day (TID) | INTRAVENOUS | Status: DC
  Administered 2013-10-19 (×2): 750 mg via INTRAVENOUS
  Filled 2013-10-18 (×4): qty 150

## 2013-10-18 MED ORDER — CHLORHEXIDINE GLUCONATE 0.12 % MT SOLN
15.0000 mL | Freq: Two times a day (BID) | OROMUCOSAL | Status: DC
  Administered 2013-10-18 – 2013-10-20 (×4): 15 mL via OROMUCOSAL
  Filled 2013-10-18 (×7): qty 15

## 2013-10-18 MED ORDER — PANTOPRAZOLE SODIUM 40 MG PO PACK
40.0000 mg | PACK | Freq: Two times a day (BID) | ORAL | Status: DC
  Filled 2013-10-18 (×3): qty 20

## 2013-10-18 MED ORDER — FREE WATER
150.0000 mL | Status: DC
  Administered 2013-10-18 – 2013-10-20 (×11): 150 mL

## 2013-10-18 MED ORDER — PANTOPRAZOLE SODIUM 40 MG PO PACK
40.0000 mg | PACK | Freq: Two times a day (BID) | ORAL | Status: DC
  Administered 2013-10-19 – 2013-10-20 (×4): 40 mg
  Filled 2013-10-18 (×6): qty 20

## 2013-10-18 MED ORDER — POLYETHYLENE GLYCOL 3350 17 G PO PACK
17.0000 g | PACK | Freq: Every day | ORAL | Status: DC
  Administered 2013-10-18 – 2013-10-20 (×3): 17 g via ORAL
  Filled 2013-10-18 (×3): qty 1

## 2013-10-18 MED ORDER — ROPINIROLE HCL 0.25 MG PO TABS
0.2500 mg | ORAL_TABLET | Freq: Every day | ORAL | Status: DC
Start: 2013-10-18 — End: 2013-10-20
  Administered 2013-10-19 (×2): 0.25 mg
  Filled 2013-10-18 (×3): qty 1

## 2013-10-18 MED ORDER — IOHEXOL 300 MG/ML  SOLN
50.0000 mL | Freq: Once | INTRAMUSCULAR | Status: AC | PRN
  Administered 2013-10-18: 10 mL

## 2013-10-18 MED ORDER — LIDOCAINE VISCOUS 2 % MT SOLN
OROMUCOSAL | Status: AC
  Filled 2013-10-18: qty 15

## 2013-10-18 MED ORDER — VITAMIN B-12 1000 MCG PO TABS
1000.0000 ug | ORAL_TABLET | Freq: Every day | ORAL | Status: DC
  Administered 2013-10-19 – 2013-10-20 (×2): 1000 ug
  Filled 2013-10-18 (×2): qty 1

## 2013-10-18 NOTE — Procedures (Signed)
Procedure:  Gastrostomy replacement New 18 Fr balloon retention gastrostomy advanced into stomach.  Contrast injection under fluoro confirms tip positioning in body of stomach. OK to use new g-tube.

## 2013-10-18 NOTE — Clinical Social Work Psychosocial (Signed)
Clinical Social Work Department BRIEF PSYCHOSOCIAL ASSESSMENT 10/18/2013  Patient:  [name] [name],[name] [name]     Account Number:  [id number]     Admit date:  10/17/2013  Clinical Social Worker:  Lavell LusterAMPBELL,Keshara Kiger BRYANT, LCSWA  Date/Time:  10/18/2013 03:37 PM  Referred by:  Physician  Date Referred:  10/18/2013 Referred for  SNF Placement   Other Referral:   Interview type:  Family Other interview type:   Patient's sister interviewed by phone as no family present and patient unable to contribute at this time.    PSYCHOSOCIAL DATA Living Status:  FACILITY Admitted from facility:  [facility] Level of care:  Skilled Nursing Facility Primary support name:  [name] [name] Primary support relationship to patient:  SIBLING Degree of support available:   Support is good.    CURRENT CONCERNS Current Concerns  Post-Acute Placement   Other Concerns:    SOCIAL WORK ASSESSMENT / PLAN CSW spoke with patient's sister [name] [name] by phone to complete assessment. [name] [name] states that the patient is a [facility]. [name] [name] states that she is happy with the care her sister receives at the facility and plans for the patient to return to the facility at discharge. [name] [name] does express concern about patient's PEG tube. CSW offered emotional support sister. CSW will assist with DC when appropriate.   Assessment/plan status:  Psychosocial Support/Ongoing Assessment of Needs Other assessment/ plan:   Complete FL2, Fax, PASRR   Information/referral to community resources:   CSW contact information given.    PATIENT'S/FAMILY'S RESPONSE TO PLAN OF CARE: Patient's sister plans for patient to [facility] when stable. CSW will assist.       Roddie McBryant Valene Villa MSW, [place]CSWA, [place], [phone]

## 2013-10-18 NOTE — Progress Notes (Signed)
Unable to complete admission history at this time because patient is nonverbal and no family is at bedside.

## 2013-10-18 NOTE — Care Management Note (Signed)
    Page 1 of 1   10/20/2013     2:51:11 PM CARE MANAGEMENT NOTE 10/20/2013  Patient:  [name] [name]   Account Number:  [id number]  Date Initiated:  10/18/2013  Documentation initiated by:  Letha CapeAYLOR,Tudor Chandley  Subjective/Objective Assessment:   dx uti, tachycardia, peg tube malfunciton  admit- [facility]     Action/Plan:   Anticipated DC Date:  10/20/2013   Anticipated DC Plan:  SKILLED NURSING FACILITY  In-house referral  Clinical Social Worker      DC Planning Services  CM consult      Choice offered to / List presented to:             Status of service:  Completed, signed off Medicare Important Message given?  YES (If response is "NO", the following Medicare IM given date fields will be blank) Date Medicare IM given:  10/19/2013 Medicare IM given by:  Letha CapeAYLOR,Mckinsey Keagle Date Additional Medicare IM given:   Additional Medicare IM given by:    Discharge Disposition:  SKILLED NURSING FACILITY  Per UR Regulation:  Reviewed for med. necessity/level of care/duration of stay  If discussed at Long Length of Stay Meetings, dates discussed:    Comments:  10/21/14 1450 Letha Capeeborah Seniah Lawrence RN, BSN [phone] patient [facility].  10/19/12 1223 Letha Capeeborah Aubrei Bouchie RN, BSN [phone] [facility], CSW aware.

## 2013-10-18 NOTE — Progress Notes (Signed)
Paged on call MD about possibly changing oral meds to IV due to PEG tube being dislodged after patient came to unit. No new orders at this time.

## 2013-10-18 NOTE — Consult Note (Signed)
Urology Consult  Referring physician:   Dr. Conley Canal Reason for referral:  Bladder stone  Chief Complaint:   UTI  History of Present Illness: 43 yo AA female :   Principal Problem:  SIRS (systemic inflammatory response syndrome). BP, HR ok  Active Problems:  Dementia  Ivin Booty disease  UTI (lower urinary tract infection): on rocephin. Culture pending  Sinus tachycardia  Dysphagia  PEG tube malfunction: unclear who put PEG back in in ED, but it apparently came out overnight. Will ask IR to replace, as they have done in the  Bladder stone v. Foreign body. Found indcidentaly on Ct.    Past Medical History  Diagnosis Date  . Dementia   . Restless leg syndrome   . Esophageal reflux   . UTI (urinary tract infection)   . Depression   . Pneumonia   . Constipation   . Aphagia   . Bacteremia   . Sepsis   . Ivin Booty disease   . Vitamin B12 deficiency    Past Surgical History  Procedure Laterality Date  . Peg tube placement      16 [place]  . Cholecystectomy      Medications: I have reviewed the patient's current medications. Allergies:  Allergies  Allergen Reactions  . Sulfa Antibiotics Other (See Comments)    ON MAR    History reviewed. No pertinent family history. Social History:  reports that she has never smoked. She does not have any smokeless tobacco history on file. She reports that she does not drink alcohol or use illicit drugs.  ROS: [y unable to respond for ROS   Physical Exam:  Vital signs in last 24 hours: Temp:  [98.8 F (37.1 C)-99.6 F (37.6 C)] 99 F (37.2 C) (08/17 2125) Pulse Rate:  [100-125] 125 (08/17 2125) Resp:  [20-44] 44 (08/17 2125) BP: (120-147)/(80-101) 120/80 mmHg (08/17 2125) SpO2:  [88 %-100 %] 98 % (08/17 2149) Weight:  [66.6 kg (146 lb 13.2 oz)] 66.6 kg (146 lb 13.2 oz) (08/17 2100)  Cardiovascular: Skin warm; not flushed Respiratory: Breaths quiet; no shortness of breath Abdomen: No masses Neurological:  Normal sensation to touch Musculoskeletal: Normal motor function arms and legs Lymphatics: No inguinal adenopathy Skin: No rashes Genitourinary: Normal BUS  Laboratory Data:  Results for orders placed during the hospital encounter of 10/17/13 (from the past 72 hour(s))  URINALYSIS, ROUTINE W REFLEX MICROSCOPIC     Status: Abnormal   Collection Time    10/17/13  7:57 PM      Result Value Ref Range   Color, Urine YELLOW  YELLOW   APPearance CLOUDY (*) CLEAR   Specific Gravity, Urine 1.019  1.005 - 1.030   pH 8.0  5.0 - 8.0   Glucose, UA NEGATIVE  NEGATIVE mg/dL   Hgb urine dipstick NEGATIVE  NEGATIVE   Bilirubin Urine NEGATIVE  NEGATIVE   Ketones, ur NEGATIVE  NEGATIVE mg/dL   Protein, ur NEGATIVE  NEGATIVE mg/dL   Urobilinogen, UA 1.0  0.0 - 1.0 mg/dL   Nitrite POSITIVE (*) NEGATIVE   Leukocytes, UA SMALL (*) NEGATIVE  URINE MICROSCOPIC-ADD ON     Status: Abnormal   Collection Time    10/17/13  7:57 PM      Result Value Ref Range   WBC, UA 7-10  <3 WBC/hpf   RBC / HPF 3-6  <3 RBC/hpf   Bacteria, UA FEW (*) RARE   Urine-Other AMORPHOUS URATES/PHOSPHATES    CULTURE, BLOOD (ROUTINE X 2)  Status: None   Collection Time    10/17/13  8:20 PM      Result Value Ref Range   Specimen Description BLOOD RIGHT UPPER ARM     Special Requests BOTTLES DRAWN AEROBIC AND ANAEROBIC 5CC EACH     Culture  Setup Time       Value: 10/18/2013 00:57     Performed at [facility]   Culture       Value: [place] IN [place]     Note: Gram Stain Report Called to,Read Back By and Verified With: [place] WOOSLEY ON 10/18/2013 AT 8:26P BY WILEJ     Performed at [facility]   Report Status PENDING    CBC WITH DIFFERENTIAL     Status: Abnormal   Collection Time    10/17/13  8:20 PM      Result Value Ref Range   WBC 14.7 (*) 4.0 - 10.5 K/uL   RBC 4.65  3.87 - 5.11 MIL/uL   Hemoglobin 12.7  12.0 - 15.0 g/dL   HCT 39.5  36.0 - 46.0 %   MCV 84.9  78.0 - 100.0 fL   MCH  27.3  26.0 - 34.0 pg   MCHC 32.2  30.0 - 36.0 g/dL   RDW 15.2  11.5 - 15.5 %   Platelets 337  150 - 400 K/uL   Neutrophils Relative % 76  43 - 77 %   Neutro Abs 11.2 (*) 1.7 - 7.7 K/uL   Lymphocytes Relative 16  12 - 46 %   Lymphs Abs 2.3  0.7 - 4.0 K/uL   Monocytes Relative 4  3 - 12 %   Monocytes Absolute 0.6  0.1 - 1.0 K/uL   Eosinophils Relative 4  0 - 5 %   Eosinophils Absolute 0.5  0.0 - 0.7 K/uL   Basophils Relative 0  0 - 1 %   Basophils Absolute 0.0  0.0 - 0.1 K/uL  COMPREHENSIVE METABOLIC PANEL     Status: Abnormal   Collection Time    10/17/13  8:20 PM      Result Value Ref Range   Sodium 141  137 - 147 mEq/L   Potassium 4.0  3.7 - 5.3 mEq/L   Chloride 105  96 - 112 mEq/L   CO2 23  19 - 32 mEq/L   Glucose, Bld 108 (*) 70 - 99 mg/dL   BUN 14  6 - 23 mg/dL   Creatinine, Ser 0.67  0.50 - 1.10 mg/dL   Calcium 8.9  8.4 - 10.5 mg/dL   Total Protein 7.7  6.0 - 8.3 g/dL   Albumin 3.5  3.5 - 5.2 g/dL   AST 23  0 - 37 U/L   ALT 20  0 - 35 U/L   Alkaline Phosphatase 147 (*) 39 - 117 U/L   Total Bilirubin 0.4  0.3 - 1.2 mg/dL   GFR calc non Af Amer >90  >90 mL/min   GFR calc Af Amer >90  >90 mL/min   Comment: (NOTE)     The eGFR has been calculated using the CKD EPI equation.     This calculation has not been validated in all clinical situations.     eGFR's persistently <90 mL/min signify possible Chronic Kidney     Disease.   Anion gap 13  5 - 15  I-STAT CG4 LACTIC ACID, ED     Status: None   Collection Time    10/17/13  8:34 PM  Result Value Ref Range   Lactic Acid, Venous 1.86  0.5 - 2.2 mmol/L  MRSA PCR SCREENING     Status: Abnormal   Collection Time    10/18/13 12:33 AM      Result Value Ref Range   MRSA by PCR RESULT CALLED TO, READ BACK BY AND VERIFIED WITH: (*) NEGATIVE   Comment: BUTLER,T RN [phone] AT 0200 SKEEN,P                The GeneXpert MRSA Assay (FDA     approved for NASAL specimens     only), is one component of a     comprehensive MRSA  colonization     surveillance program. It is not     intended to diagnose MRSA     infection nor to guide or     monitor treatment for     MRSA infections.     POSITIVE  TSH     Status: None   Collection Time    10/18/13  1:00 AM      Result Value Ref Range   TSH 1.340  0.350 - 4.500 uIU/mL  T4, FREE     Status: None   Collection Time    10/18/13  1:00 AM      Result Value Ref Range   Free T4 1.40  0.80 - 1.80 ng/dL   Comment: Performed at [facility]     Status: Abnormal   Collection Time    10/18/13  1:00 AM      Result Value Ref Range   Sodium 142  137 - 147 mEq/L   Potassium 4.4  3.7 - 5.3 mEq/L   Chloride 106  96 - 112 mEq/L   CO2 23  19 - 32 mEq/L   Glucose, Bld 114 (*) 70 - 99 mg/dL   BUN 10  6 - 23 mg/dL   Creatinine, Ser 0.61  0.50 - 1.10 mg/dL   Calcium 8.4  8.4 - 10.5 mg/dL   GFR calc non Af Amer >90  >90 mL/min   GFR calc Af Amer >90  >90 mL/min   Comment: (NOTE)     The eGFR has been calculated using the CKD EPI equation.     This calculation has not been validated in all clinical situations.     eGFR's persistently <90 mL/min signify possible Chronic Kidney     Disease.   Anion gap 13  5 - 15  CBC     Status: Abnormal   Collection Time    10/18/13  1:00 AM      Result Value Ref Range   WBC 17.5 (*) 4.0 - 10.5 K/uL   RBC 4.44  3.87 - 5.11 MIL/uL   Hemoglobin 11.9 (*) 12.0 - 15.0 g/dL   HCT 37.8  36.0 - 46.0 %   MCV 85.1  78.0 - 100.0 fL   MCH 26.8  26.0 - 34.0 pg   MCHC 31.5  30.0 - 36.0 g/dL   RDW 15.2  11.5 - 15.5 %   Platelets 297  150 - 400 K/uL  GLUCOSE, CAPILLARY     Status: Abnormal   Collection Time    10/18/13  8:01 AM      Result Value Ref Range   Glucose-Capillary 112 (*) 70 - 99 mg/dL  GLUCOSE, CAPILLARY     Status: Abnormal   Collection Time    10/18/13 11:44 AM      Result Value Ref  Range   Glucose-Capillary 108 (*) 70 - 99 mg/dL  GLUCOSE, CAPILLARY     Status: None   Collection Time    10/18/13   2:24 PM      Result Value Ref Range   Glucose-Capillary 98  70 - 99 mg/dL   Comment 1 Notify RN    GLUCOSE, CAPILLARY     Status: None   Collection Time    10/18/13  5:35 PM      Result Value Ref Range   Glucose-Capillary 89  70 - 99 mg/dL  GLUCOSE, CAPILLARY     Status: Abnormal   Collection Time    10/18/13  9:27 PM      Result Value Ref Range   Glucose-Capillary 109 (*) 70 - 99 mg/dL   Comment 1 Documented in Chart     Comment 2 Notify RN     Recent Results (from the past 240 hour(s))  CULTURE, BLOOD (ROUTINE X 2)     Status: None   Collection Time    10/17/13  8:20 PM      Result Value Ref Range Status   Specimen Description BLOOD RIGHT UPPER ARM   Final   Special Requests BOTTLES DRAWN AEROBIC AND ANAEROBIC 5CC EACH   Final   Culture  Setup Time     Final   Value: 10/18/2013 00:57     Performed at [facility]   Culture     Final   Value: GRAM POSITIVE COCCI IN CLUSTERS     Note: Gram Stain Report Called to,Read Back By and Verified With: [place] WOOSLEY ON 10/18/2013 AT 8:26P BY WILEJ     Performed at [facility]   Report Status PENDING   Incomplete  MRSA PCR SCREENING     Status: Abnormal   Collection Time    10/18/13 12:33 AM      Result Value Ref Range Status   MRSA by PCR RESULT CALLED TO, READ BACK BY AND VERIFIED WITH: (*) NEGATIVE Final   Comment: BUTLER,T RN [id number] AT 63 SKEEN,P                The GeneXpert MRSA Assay (FDA     approved for NASAL specimens     only), is one component of a     comprehensive MRSA colonization     surveillance program. It is not     intended to diagnose MRSA     infection nor to guide or     monitor treatment for     MRSA infections.     POSITIVE   Creatinine:  Recent Labs  10/17/13 2020 10/18/13 0100  CREATININE 0.67 0.61    Xrays: CLINICAL DATA: Abdominal pain.  EXAM:  CT ABDOMEN AND PELVIS WITH CONTRAST  TECHNIQUE:  Multidetector CT imaging of the abdomen and pelvis was performed  using  the standard protocol following bolus administration of  intravenous contrast.  CONTRAST: 136m OMNIPAQUE IOHEXOL 300 MG/ML SOLN  COMPARISON: None.  FINDINGS:  Lung bases:  Images are degraded by breathing motion artifact. Dependent  atelectasis is noted. No definite pleural effusion.  The solid abdominal organs are grossly normal despite motion  artifact. No obvious inflammatory changes or mass lesions. No renal  or obstructing ureteral calculi. The gallbladder is surgically  absent. No common bowel duct dilatation.  The feeding gastrostomy tube is in good position. No complicating  features the duodenum, small bowel and colon are unremarkable. No  inflammatory changes, mass lesions or obstructive  findings. The  appendix is normal. No mesenteric or retroperitoneal mass or  adenopathy. The aorta and branch vessels are patent. The major  venous structures are patent.  CT pelvis:  The uterus and ovaries are unremarkable. The bladder appears normal.  No pelvic mass, adenopathy or free pelvic fluid collections. No  inguinal mass or adenopathy. There is a radiodensity noted in the  bladder. This appears to be a foreign body and not a bladder  calculus.  Bony structures:  No significant findings.  IMPRESSION:  1. Feeding gastrostomy tube is in good position without complicating  features. No findings for small bowel obstruction.  2. Radiopaque foreign body in the bladder. Alternatively it could be  an unusual bladder calculus.  3. No acute abdominal/pelvic findings, mass lesions or adenopathy.  Electronically Signed  By: Kalman Jewels M.D.  On: 10/17/2013 22:49    Impression/Assessment:  43 yo female, with  J-K disease with bladder stone. Pt seen and examined, and CT reviewed. Discussed with RN.  Urine shows nitrate +,but only 7-10 wbc, and 3-6 RBC, and few bacteria.  MRSA screening + ( nasal).  Because of the patient's poor health and poor prognosis, I would not advocate for  cystotomy and bladder stone removal. She is not toxic, and does not appear uncomfortable. She would have significant surgical morbidity/mortality.  Plan:   Please reconsult if I can be of further help.  Zeidy Tayag I 10/18/2013, 10:39 PM

## 2013-10-18 NOTE — Progress Notes (Signed)
ANTIBIOTIC CONSULT NOTE - INITIAL  Pharmacy Consult for vancomycin Indication: Bacteremia  Allergies  Allergen Reactions  . Sulfa Antibiotics Other (See Comments)    ON MAR    Patient Measurements: Height: 5' 8.11" (173 cm) Weight: 146 lb 13.2 oz (66.6 kg) IBW/kg (Calculated) : 64.15 Adjusted Body Weight:   Vital Signs: Temp: 99 F (37.2 C) (08/17 2125) Temp src: Axillary (08/17 2125) BP: 120/80 mmHg (08/17 2125) Pulse Rate: 125 (08/17 2125) Intake/Output from previous day: 08/16 0701 - 08/17 0700 In: 2878 [I.V.:2878] Out: -  Intake/Output from this shift:    Labs:  Recent Labs  10/17/13 2020 10/18/13 0100  WBC 14.7* 17.5*  HGB 12.7 11.9*  PLT 337 297  CREATININE 0.67 0.61   Estimated Creatinine Clearance: 91.9 ml/min (by C-G formula based on Cr of 0.61). No results found for this basename: VANCOTROUGH, Leodis BinetVANCOPEAK, VANCORANDOM, GENTTROUGH, GENTPEAK, GENTRANDOM, TOBRATROUGH, TOBRAPEAK, TOBRARND, AMIKACINPEAK, AMIKACINTROU, AMIKACIN,  in the last 72 hours   Microbiology: Recent Results (from the past 720 hour(s))  CULTURE, BLOOD (ROUTINE X 2)     Status: None   Collection Time    10/17/13  8:20 PM      Result Value Ref Range Status   Specimen Description BLOOD RIGHT UPPER ARM   Final   Special Requests BOTTLES DRAWN AEROBIC AND ANAEROBIC [facility]5CC EACH   Final   Culture  Setup Time     Final   Value: 10/18/2013 00:57     Performed at [facility]   Culture     Final   Value: GRAM POSITIVE COCCI IN CLUSTERS     Note: Gram Stain Report Called to,Read Back By and Verified With: [place]JORDAN WOOSLEY ON 10/18/2013 AT 8:26P BY WILEJ     Performed at [facility]   Report Status PENDING   Incomplete  MRSA PCR SCREENING     Status: Abnormal   Collection Time    10/18/13 12:33 AM      Result Value Ref Range Status   MRSA by PCR RESULT CALLED TO, READ BACK BY AND VERIFIED WITH: (*) NEGATIVE Final   Comment: BUTLER,T RN [phone] AT 0200 SKEEN,P                The  GeneXpert MRSA Assay (FDA     approved for NASAL specimens     only), is one component of a     comprehensive MRSA colonization     surveillance program. It is not     intended to diagnose MRSA     infection nor to guide or     monitor treatment for     MRSA infections.     POSITIVE    Medical History: Past Medical History  Diagnosis Date  . Dementia   . Restless leg syndrome   . Esophageal reflux   . UTI (urinary tract infection)   . Depression   . Pneumonia   . Constipation   . Aphagia   . Bacteremia   . Sepsis   . Lenise ArenaCreutzfeldt Jakob disease   . Vitamin B12 deficiency     Medications:  Scheduled:  . sodium chloride   Intravenous STAT  . albuterol  2.5 mg Nebulization QID  . antiseptic oral rinse  7 mL Mouth Rinse q12n4p  . baclofen  10 mg Per Tube TID  . cefTRIAXone (ROCEPHIN)  IV  1 g Intravenous Q24H  . chlorhexidine  15 mL Mouth Rinse BID  . free water  150 mL Per Tube  Q4H  . lidocaine      . pantoprazole sodium  40 mg Per Tube BID  . polyethylene glycol  17 g Oral Daily  . rOPINIRole  0.25 mg Per Tube QHS  . sodium chloride  3 mL Intravenous Q12H  . vancomycin  750 mg Intravenous Q8H  . [START ON 10/19/2013] vitamin B-12  1,000 mcg Per Tube Daily   Assessment: 43 yr old female was brought to the ED with a PEG tube malfunction and tachycardia on 8/16.  She was found to have a UTI and was started on Rocephin. There were some problems replacing the PEG tube and it was finally done in IR today. Now, vanc per rx has been ordered for bacteremia.  Goal of Therapy:  Vancomycin trough level 15-20 mcg/ml  Plan:  Vancomycin 750 mg IV q8h. Trough level when appropriate.   Eugene Garnet 10/18/2013,9:43 PM

## 2013-10-18 NOTE — Progress Notes (Signed)
[facility] PROGRESS NOTE  [name] [name]:[id number] DOB: 06/02/1970 DOA: 10/17/2013 PCP: [name], SAAD, MD Chart reviewed.  Assessment/Plan:  Principal Problem:   SIRS (systemic inflammatory response syndrome). BP, HR ok Active Problems:   Dementia   Lenise ArenaCreutzfeldt Jakob disease   UTI (lower urinary tract infection): on rocephin. Culture pending   Sinus tachycardia   Dysphagia   PEG tube malfunction: unclear who put PEG back in in ED, but it apparently came out overnight. Will ask IR to replace, as they have done in the  Bladder stone v. Foreign body. Will discuss with urology next steps.  Wheezing: will schedule nebulizers. CXR negative.  Code Status:  full Family Communication:  Left message with summer Disposition Plan:  SNF when stable  Consultants:  urology  Procedures:     Antibiotics:  rocephin  HPI/Subjective: unable  Objective: Filed Vitals:   10/18/13 0500  BP: 125/85  Pulse: 109  Temp: 99 F (37.2 C)  Resp: 22    Intake/Output Summary (Last 24 hours) at 10/18/13 1340 Last data filed at 10/17/13 2315  Gross per 24 hour  Intake   2878 ml  Output      0 ml  Net   2878 ml   Filed Weights   10/17/13 1900 10/18/13 0010  Weight: 62.596 kg (138 lb) 66.6 kg (146 lb 13.2 oz)   Tele NSR  Exam:   General:  Awake. Speech unintelligible  Cardiovascular: RRR without MGR  Respiratory: bilateral wheeze  Abdomen: s, nt, nd  Ext: trace edema  Basic Metabolic Panel:  Recent Labs Lab 10/17/13 2020 10/18/13 0100  NA 141 142  K 4.0 4.4  CL 105 106  CO2 23 23  GLUCOSE 108* 114*  BUN 14 10  CREATININE 0.67 0.61  CALCIUM 8.9 8.4   Liver Function Tests:  Recent Labs Lab 10/17/13 2020  AST 23  ALT 20  ALKPHOS 147*  BILITOT 0.4  PROT 7.7  ALBUMIN 3.5   No results found for this basename: LIPASE, AMYLASE,  in the last 168 hours No results found for this basename: AMMONIA,  in the last 168 hours CBC:  Recent Labs Lab  10/17/13 2020 10/18/13 0100  WBC 14.7* 17.5*  NEUTROABS 11.2*  --   HGB 12.7 11.9*  HCT 39.5 37.8  MCV 84.9 85.1  PLT 337 297   Cardiac Enzymes: No results found for this basename: CKTOTAL, CKMB, CKMBINDEX, TROPONINI,  in the last 168 hours BNP (last 3 results) No results found for this basename: PROBNP,  in the last 8760 hours CBG:  Recent Labs Lab 10/18/13 0801 10/18/13 1144  GLUCAP 112* 108*    Recent Results (from the past 240 hour(s))  MRSA PCR SCREENING     Status: Abnormal   Collection Time    10/18/13 12:33 AM      Result Value Ref Range Status   MRSA by PCR RESULT CALLED TO, READ BACK BY AND VERIFIED WITH: (*) NEGATIVE Final   Comment: BUTLER,T RN [phone] AT 0200 SKEEN,P                The GeneXpert MRSA Assay (FDA     approved for NASAL specimens     only), is one component of a     comprehensive MRSA colonization     surveillance program. It is not     intended to diagnose MRSA     infection nor to guide or     monitor treatment for     MRSA  infections.     POSITIVE     Studies: Ct Abdomen Pelvis W Contrast  10/17/2013   CLINICAL DATA:  Abdominal pain.  EXAM: CT ABDOMEN AND PELVIS WITH CONTRAST  TECHNIQUE: Multidetector CT imaging of the abdomen and pelvis was performed using the standard protocol following bolus administration of intravenous contrast.  CONTRAST:  OMNIPAQUE IOHEXOL 300 MG/ML  SOLN  COMPARISON:  None.  FINDINGS: Lung bases:  Images are degraded by breathing motion artifact. Dependent atelectasis is noted. No definite pleural effusion.  The solid abdominal organs are grossly normal despite motion artifact. No obvious inflammatory changes or mass lesions. No renal or obstructing ureteral calculi. The gallbladder is surgically absent. No common bowel duct dilatation.  The feeding gastrostomy tube is in good position. No complicating features the duodenum, small bowel and colon are unremarkable. No inflammatory changes, mass lesions or  obstructive findings. The appendix is normal. No mesenteric or retroperitoneal mass or adenopathy. The aorta and branch vessels are patent. The major venous structures are patent.  CT pelvis:  The uterus and ovaries are unremarkable. The bladder appears normal. No pelvic mass, adenopathy or free pelvic fluid collections. No inguinal mass or adenopathy. There is a radiodensity noted in the bladder. This appears to be a foreign body and not a bladder calculus.  Bony structures:  No significant findings.  IMPRESSION: 1. Feeding gastrostomy tube is in good position without complicating features. No findings for small bowel obstruction. 2. Radiopaque foreign body in the bladder. Alternatively it could be an unusual bladder calculus. 3. No acute abdominal/pelvic findings, mass lesions or adenopathy.   Electronically Signed   By: Loralie Champagne M.D.   On: 10/17/2013 22:49   Dg Chest Port 1 View  10/17/2013   CLINICAL DATA:  Peg tube displacement.  EXAM: PORTABLE CHEST - 1 VIEW  COMPARISON:  03/13/2013  FINDINGS: Heart size and pulmonary vascularity are normal and the lungs are clear. Markings are slightly accentuated on the left due to the shallow inspiration. No osseous abnormality.  IMPRESSION: No acute abnormalities.   Electronically Signed   By: Geanie Cooley M.D.   On: 10/17/2013 19:23   Dg Abd Portable 1v  10/17/2013   CLINICAL DATA:  G-tube placement.  EXAM: PORTABLE ABDOMEN - 1 VIEW  COMPARISON:  03/13/2013  FINDINGS: The feeding gastrostomy tube appears to be in good position. There is contrast in the stomach and duodenal bulb. No extravasating contrast. The bowel gas pattern is unremarkable. The lung bases are grossly clear.  IMPRESSION: Feeding gastrostomy tube in good position without complicating features.   Electronically Signed   By: Loralie Champagne M.D.   On: 10/17/2013 19:31    Scheduled Meds: . sodium chloride   Intravenous STAT  . albuterol  2.5 mg Nebulization QID  . antiseptic oral rinse  7  mL Mouth Rinse q12n4p  . baclofen  10 mg Per Tube TID  . cefTRIAXone (ROCEPHIN)  IV  1 g Intravenous Q24H  . chlorhexidine  15 mL Mouth Rinse BID  . enoxaparin (LOVENOX) injection  40 mg Subcutaneous Q24H  . free water  150 mL Per Tube Q4H  . loratadine  10 mg Oral Daily  . meloxicam  7.5 mg Oral Daily  . pantoprazole sodium  40 mg Per Tube BID  . polyethylene glycol  17 g Oral Daily  . rOPINIRole  0.25 mg Per Tube QHS  . sodium chloride  3 mL Intravenous Q12H  . vitamin B-12  1,000 mcg Per Tube  Daily   Continuous Infusions: . sodium chloride Stopped (10/17/13 2315)  . sodium chloride 75 mL/hr at 10/18/13 0013  . feeding supplement (JEVITY 1.5 CAL/FIBER)      Time spent: 35 minutes  Tyheim Vanalstyne L  [facility] Pager [phone]. If 7PM-7AM, please contact night-coverage at [email or web address], password [facility] 10/18/2013, 1:40 PM  LOS: 1 day

## 2013-10-18 NOTE — Progress Notes (Signed)
INITIAL NUTRITION ASSESSMENT  DOCUMENTATION CODES Per approved criteria  -Not Applicable   INTERVENTION: -Continue Jevity 1.5 @ 50 ml/hr with 150 ml free water flush every 4 hours, provides 1800 kcal, 77 g protein, 1812 ml free water.   NUTRITION DIAGNOSIS: Inadequate oral intake related to inability to eat as evidenced by NPO status  Goal: Patient will meet >/=90% of estimated nutrition needs  Monitor:  TF tolerance, weight, labs, I/Os  Reason for Assessment: Malnutrition screening tool  43 y.o. female  Admitting Dx: SIRS (systemic inflammatory response syndrome)  ASSESSMENT: 43 y.o. female from area SNF with history of Cruzfeld [name] Disease who was sent to the ED due to PEG tube problems and had PEG Tube Replacement in the ED and was found to have persistent tachycardia, and a UTI and was retained for admission.  Patient with PEG placement, on home TF.  Home TF: Jevity 1.5 @ 50 ml/hr, 150 ml free water 6 times daily, providing 1800 kcal, 77 g protein, 1812 ml free water.  Current TF: Running at home rate, patient is tolerating.   No weight loss noted.   Height: Ht Readings from Last 1 Encounters:  03/12/13 5\' 8"  (1.727 m)    Weight: Wt Readings from Last 1 Encounters:  10/18/13 146 lb 13.2 oz (66.6 kg)    Ideal Body Weight: 140 pounds  % Ideal Body Weight: 104%  Wt Readings from Last 10 Encounters:  10/18/13 146 lb 13.2 oz (66.6 kg)  03/12/13 138 lb 3.7 oz (62.7 kg)    Usual Body Weight: 138 pounds  % Usual Body Weight: 106%  BMI:  Body mass index is 22.33 kg/(m^2). Patient is normal weight.   Estimated Nutritional Needs: Kcal: 1600-1800 kcal Protein: 75-85 g Fluid: 1.8-2.0 L/day  Skin: intact  Diet Order: NPO  EDUCATION NEEDS: -No education needs identified at this time   Intake/Output Summary (Last 24 hours) at 10/18/13 1526 Last data filed at 10/17/13 2315  Gross per 24 hour  Intake   2878 ml  Output      0 ml  Net   2878 ml    Last  BM: PTA   Labs:   Recent Labs Lab 10/17/13 2020 10/18/13 0100  NA 141 142  K 4.0 4.4  CL 105 106  CO2 23 23  BUN 14 10  CREATININE 0.67 0.61  CALCIUM 8.9 8.4  GLUCOSE 108* 114*    CBG (last 3)   Recent Labs  10/18/13 0801 10/18/13 1144 10/18/13 1424  GLUCAP 112* 108* 98    Scheduled Meds: . sodium chloride   Intravenous STAT  . albuterol  2.5 mg Nebulization QID  . antiseptic oral rinse  7 mL Mouth Rinse q12n4p  . cefTRIAXone (ROCEPHIN)  IV  1 g Intravenous Q24H  . chlorhexidine  15 mL Mouth Rinse BID  . sodium chloride  3 mL Intravenous Q12H    Continuous Infusions: . sodium chloride Stopped (10/17/13 2315)  . sodium chloride 1,000 mL (10/18/13 1429)    Past Medical History  Diagnosis Date  . Dementia   . Restless leg syndrome   . Esophageal reflux   . UTI (urinary tract infection)   . Depression   . Pneumonia   . Constipation   . Aphagia   . Bacteremia   . Sepsis   . [name] disease   . Vitamin B12 deficiency     Past Surgical History  Procedure Laterality Date  . Peg tube placement  16 [place]  . Cholecystectomy      Linnell FullingElyse Felise Georgia, RD, LDN Pager #: [phone] After-Hours Pager #: [phone]

## 2013-10-18 NOTE — Progress Notes (Signed)
CRITICAL VALUE ALERT  Critical value received:  Positive blood cultures aerobic gram positive cocci/clusters  Date of notification:  8/17  Time of notification:  2027  Critical value read back: Yes  Nurse who received alert:  [place] Anees Vanecek, RN  MD notified (1st page):  Burnadette PeterLynch  Time of first page: 2054  MD notified (2nd page): Burnadette PeterLynch  Time of second page: 2359  Responding MD:  Burnadette PeterLynch  Time MD responded:  0005 8/18 - Physician had ordered new IV abx after initial page

## 2013-10-18 NOTE — Progress Notes (Signed)
Pam, radiologist PA attempted to place a foley in peg site but was unsuccessful.  Will continue to monitor.  Forbes Cellarelcine Mick Tanguma, RN

## 2013-10-18 NOTE — Progress Notes (Signed)
Pt. just got back from radiology from having peg tube place with orders to use now for meds, feeds & water.  Text paged Dr. Lendell CapriceSullivan to see if she wanted to re-order meds now.  Will continue to monitor and await for return call or new orders. Forbes Cellarelcine Cletus Mehlhoff, RN

## 2013-10-18 NOTE — Progress Notes (Signed)
Pt arrived to unit awake and nonverbal. Oriented to room, unit, and staff.  Bed in lowest position and call bell is within reach. While getting patient cleaned up, PEG tube came out. MD paged and instructed to leave it out for the night and they will address it in the morning. Will continue to monitor the patient.

## 2013-10-18 NOTE — Progress Notes (Signed)
UR completed 

## 2013-10-18 NOTE — Plan of Care (Signed)
Problem: Phase I Progression Outcomes Goal: Initial discharge plan identified Outcome: Completed/Met Date Met:  10/18/13 To return to [employer]

## 2013-10-19 DIAGNOSIS — R7881 Bacteremia: Secondary | ICD-10-CM | POA: Diagnosis present

## 2013-10-19 DIAGNOSIS — R062 Wheezing: Secondary | ICD-10-CM

## 2013-10-19 LAB — CULTURE, BLOOD (ROUTINE X 2)

## 2013-10-19 MED ORDER — MUPIROCIN 2 % EX OINT
TOPICAL_OINTMENT | Freq: Two times a day (BID) | CUTANEOUS | Status: DC
  Administered 2013-10-19 – 2013-10-20 (×3): via NASAL
  Filled 2013-10-19: qty 22

## 2013-10-19 MED ORDER — WHITE PETROLATUM GEL
Status: AC
  Administered 2013-10-19: 0.2
  Filled 2013-10-19: qty 5

## 2013-10-19 NOTE — Progress Notes (Signed)
Physician made aware of pt's elevated respiratory rate. Pt in no s/s of distress.

## 2013-10-19 NOTE — Clinical Documentation Improvement (Signed)
Possible Clinical Conditions? Sepsis with UTI Septicemia / Sepsis Severe Sepsis Septic Shock Sepsis due to an internal device  Other Condition  Cannot clinically Determine   SUPPORTING INFORMATION: (AS PER NOTES) "in the ED and was found to have persistent tachycardia, and a UTI and was retained for admission."  Vitals upon Admission: Blood pressure 137/99, pulse 116, temperature 99.7 F (37.6 C), temperature source Rectal, resp. rate 39, weight 62.596 kg (138 lb), SpO2 97.00%.    Thank You, Nevin BloodgoodJoan B Khamya Topp, RN, BSN, CCDS,Clinical Documentation Specialist:  [phone]  [phone]=[facility]- Health Information Management

## 2013-10-19 NOTE — Progress Notes (Signed)
Patient's sister [name] updated via telephone. Admission questions and immunization hx gone over with patient's sister, [name].

## 2013-10-19 NOTE — Progress Notes (Signed)
[facility] PROGRESS NOTE  [name] [name] WUJ:[id number] DOB: 08-Feb-1971 DOA: 10/17/2013 PCP: [name], MD  Overnight, started on vancomycin for GPC in clusters in blood culture  Assessment/Plan:  Principal Problem:   SIRS (systemic inflammatory response syndrome). BP, HR ok Active Problems:   Dementia   [name] disease   UTI (lower urinary tract infection): on rocephin. Culture growing GNR. Continue rocephin and f/u cx    Sinus tachycardia   Dysphagia   PEG tube malfunction: replaced by IR Bladder stone v. Foreign body. Appreciate Dr. Patsi Sears. Not a candidate for removal Wheezing: continue HHN Staph epi in blood cultures on admission. Repeat negative thus far. Likely contaminate. D/c vancomycin.  Code Status:  full Family Communication:  Left message with sister 8/17 Disposition Plan:  SNF when stable  Consultants:  urology  Procedures:     Antibiotics:  rocephin  HPI/Subjective: unable  Objective: Filed Vitals:   10/19/13 0721  BP: 95/67  Pulse: 106  Temp: 98.9 F (37.2 C)  Resp: 32    Intake/Output Summary (Last 24 hours) at 10/19/13 1315 Last data filed at 10/19/13 0615  Gross per 24 hour  Intake 3052.5 ml  Output      0 ml  Net 3052.5 ml   Filed Weights   10/17/13 1900 10/18/13 0010 10/18/13 2100  Weight: 62.596 kg (138 lb) 66.6 kg (146 lb 13.2 oz) 66.6 kg (146 lb 13.2 oz)   Tele NSR  Exam:   General:  Awake. Speech unintelligible  Cardiovascular: RRR without MGR  Respiratory: bilateral wheeze  Abdomen: s, nt, nd  Ext: trace edema  Basic Metabolic Panel:  Recent Labs Lab 10/17/13 2020 10/18/13 0100  NA 141 142  K 4.0 4.4  CL 105 106  CO2 23 23  GLUCOSE 108* 114*  BUN 14 10  CREATININE 0.67 0.61  CALCIUM 8.9 8.4   Liver Function Tests:  Recent Labs Lab 10/17/13 2020  AST 23  ALT 20  ALKPHOS 147*  BILITOT 0.4  PROT 7.7  ALBUMIN 3.5   No results found for this basename: LIPASE, AMYLASE,  in  the last 168 hours No results found for this basename: AMMONIA,  in the last 168 hours CBC:  Recent Labs Lab 10/17/13 2020 10/18/13 0100  WBC 14.7* 17.5*  NEUTROABS 11.2*  --   HGB 12.7 11.9*  HCT 39.5 37.8  MCV 84.9 85.1  PLT 337 297   Cardiac Enzymes: No results found for this basename: CKTOTAL, CKMB, CKMBINDEX, TROPONINI,  in the last 168 hours BNP (last 3 results) No results found for this basename: PROBNP,  in the last 8760 hours CBG:  Recent Labs Lab 10/18/13 0801 10/18/13 1144 10/18/13 1424 10/18/13 1735 10/18/13 2127  GLUCAP 112* 108* 98 89 109*    Recent Results (from the past 240 hour(s))  URINE CULTURE     Status: None   Collection Time    10/17/13  7:57 PM      Result Value Ref Range Status   Specimen Description URINE, CATHETERIZED   Final   Special Requests NONE   Final   Culture  Setup Time     Final   Value: 10/18/2013 02:09     Performed at [facility] Count     Final   Value: 50,000 COLONIES/ML     Performed at [facility]   Culture     Final   Value: GRAM NEGATIVE RODS     Performed at [facility]  Report Status PENDING   Incomplete  CULTURE, BLOOD (ROUTINE X 2)     Status: None   Collection Time    10/17/13  8:20 PM      Result Value Ref Range Status   Specimen Description BLOOD RIGHT UPPER ARM   Final   Special Requests BOTTLES DRAWN AEROBIC AND ANAEROBIC 5CC EACH   Final   Culture  Setup Time     Final   Value: 10/18/2013 00:57     Performed at [facility]   Culture     Final   Value: STAPHYLOCOCCUS SPECIES (COAGULASE NEGATIVE)     Note: THE SIGNIFICANCE OF ISOLATING THIS ORGANISM FROM A SINGLE VENIPUNCTURE CANNOT BE PREDICTED WITHOUT FURTHER CLINICAL AND CULTURE CORRELATION. SUSCEPTIBILITIES AVAILABLE ONLY ON REQUEST.     Note: Gram Stain Report Called to,Read Back By and Verified With: JORDAN WOOSLEY ON 10/18/2013 AT 8:26P BY WILEJ     Performed at [facility]   Report Status  10/19/2013 FINAL   Final  MRSA PCR SCREENING     Status: Abnormal   Collection Time    10/18/13 12:33 AM      Result Value Ref Rang32e [place] ValMarylan78.2ClaudWynetta EmeryunSSpadeyl Darte[phone] by breathing motion artifact. Dependent atelectasis is noted. No definite pleural effusion.  The solid abdominal organs are grossly normal despite motion artifact. No obvious inflammatory changes or mass lesions. No renal or obstructing ureteral calculi. The gallbladder is surgically absent. No common bowel duct dilatation.  The feeding gastrostomy tube is in good  position. No complicating features the duodenum, small bowel and colon are unremarkable. No inflammatory changes, mass lesions  or obstructive findings. The appendix is normal. No mesenteric or retroperitoneal mass or adenopathy. The aorta and branch vessels are patent. The major venous structures are patent.  CT pelvis:  The uterus and ovaries are unremarkable. The bladder appears normal. No pelvic mass, adenopathy or free pelvic fluid collections. No inguinal mass or adenopathy. There is a radiodensity noted in the bladder. This appears to be a foreign body and not a bladder calculus.  Bony structures:  No significant findings.  IMPRESSION: 1. Feeding gastrostomy tube is in good position without complicating features. No findings for small bowel obstruction. 2. Radiopaque foreign body in the bladder. Alternatively it could be an unusual bladder calculus. 3. No acute abdominal/pelvic findings, mass lesions or adenopathy.   Electronically Signed   By: Loralie Champagne M.D.   On: 10/17/2013 22:49   Ir Replc Gastro/colonic Tube Percut W/fluoro  10/18/2013   CLINICAL DATA:  Dislodged gastrostomy tube.  EXAM: GASTROSTOMY TUBE EXCHANGE  CONTRAST:  10 ml Omnipaque-300  FLUOROSCOPY TIME:  18 seconds.  PROCEDURE: Attempt was made to advance an 2 French gastrostomy tube via a pre-existing tract. A 5 French catheter was advanced through the tract and into the stomach. Contrast was injected. A new 18 French balloon retention gastrostomy tube was then advanced over the wire and into the stomach. The retention balloon was inflated with 8 mL of saline. The tube was injected with contrast material and a fluoroscopic spot image obtained.  COMPLICATIONS: None.  FINDINGS: After replacement, the tip of the new catheter lies in the body of the stomach.  IMPRESSION: Placement of gastrostomy tube with new 18 French balloon retention catheter. The tip lies in the stomach.   Electronically Signed   By: Irish Lack M.D.   On:  10/18/2013 18:00   Dg Chest Port 1 View  10/17/2013   CLINICAL DATA:  Peg tube displacement.  EXAM: PORTABLE CHEST - 1 VIEW  COMPARISON:  03/13/2013  FINDINGS: Heart size and pulmonary vascularity are normal and the lungs are clear. Markings are slightly accentuated on the left due to the shallow inspiration. No osseous abnormality.  IMPRESSION: No acute abnormalities.   Electronically Signed   By: Geanie Cooley M.D.   On: 10/17/2013 19:23   Dg Abd Portable 1v  10/17/2013   CLINICAL DATA:  G-tube placement.  EXAM: PORTABLE ABDOMEN - 1 VIEW  COMPARISON:  03/13/2013  FINDINGS: The feeding gastrostomy tube appears to be in good position. There is contrast in the stomach and duodenal bulb. No extravasating contrast. The bowel gas pattern is unremarkable. The lung bases are grossly clear.  IMPRESSION: Feeding gastrostomy tube in good position without complicating features.   Electronically Signed   By: Loralie Champagne M.D.   On: 10/17/2013 19:31    Scheduled Meds: . albuterol  2.5 mg Nebulization QID  . antiseptic oral rinse  7 mL Mouth Rinse q12n4p  . baclofen  10 mg Per Tube TID  . cefTRIAXone (ROCEPHIN)  IV  1 g Intravenous Q24H  . chlorhexidine  15 mL Mouth Rinse BID  . free water  150 mL Per Tube Q4H  . mupirocin ointment   Nasal BID  . pantoprazole sodium  40 mg Per Tube BID  . polyethylene glycol  17 g Oral Daily  . rOPINIRole  0.25 mg Per Tube QHS  . sodium chloride  3 mL Intravenous Q12H  . vitamin B-12  1,000 mcg Per Tube Daily   Continuous Infusions: . feeding supplement (JEVITY 1.5 CAL/FIBER) 1,000  mL (10/18/13 2021)    Time spent: 25 minutes  Antwoine Zorn L  [facility] Pager [phone]. If 7PM-7AM, please contact night-coverage at [email or web address], password [email or web address] 10/19/2013, 1:15 PM  LOS: 2 days

## 2013-10-20 LAB — CBC WITH DIFFERENTIAL/PLATELET
Basophils Absolute: 0 10*3/uL (ref 0.0–0.1)
Basophils Relative: 0 % (ref 0–1)
Eosinophils Absolute: 1 10*3/uL — ABNORMAL HIGH (ref 0.0–0.7)
Eosinophils Relative: 12 % — ABNORMAL HIGH (ref 0–5)
HEMATOCRIT: 37.6 % (ref 36.0–46.0)
Hemoglobin: 11.8 g/dL — ABNORMAL LOW (ref 12.0–15.0)
LYMPHS PCT: 23 % (ref 12–46)
Lymphs Abs: 1.9 10*3/uL (ref 0.7–4.0)
MCH: 26.7 pg (ref 26.0–34.0)
MCHC: 31.4 g/dL (ref 30.0–36.0)
MCV: 85.1 fL (ref 78.0–100.0)
Monocytes Absolute: 0.6 10*3/uL (ref 0.1–1.0)
Monocytes Relative: 7 % (ref 3–12)
NEUTROS ABS: 4.8 10*3/uL (ref 1.7–7.7)
Neutrophils Relative %: 58 % (ref 43–77)
PLATELETS: 288 10*3/uL (ref 150–400)
RBC: 4.42 MIL/uL (ref 3.87–5.11)
RDW: 15.3 % (ref 11.5–15.5)
WBC: 8.3 10*3/uL (ref 4.0–10.5)

## 2013-10-20 LAB — URINE CULTURE: Colony Count: 50000

## 2013-10-20 LAB — GLUCOSE, CAPILLARY: Glucose-Capillary: 119 mg/dL — ABNORMAL HIGH (ref 70–99)

## 2013-10-20 MED ORDER — CEPHALEXIN 250 MG/5ML PO SUSR
500.0000 mg | Freq: Two times a day (BID) | ORAL | Status: DC
Start: 2013-10-20 — End: 2015-01-21

## 2013-10-20 MED ORDER — CEPHALEXIN 250 MG/5ML PO SUSR
500.0000 mg | Freq: Two times a day (BID) | ORAL | Status: DC
  Filled 2013-10-20: qty 10

## 2013-10-20 MED ORDER — ALBUTEROL SULFATE (2.5 MG/3ML) 0.083% IN NEBU: 2.5000 mg | INHALATION_SOLUTION | Freq: Four times a day (QID) | RESPIRATORY_TRACT | Status: DC

## 2013-10-20 NOTE — Progress Notes (Signed)
[facility]

## 2013-10-20 NOTE — Discharge Summary (Signed)
Physician Discharge Summary  [name] SchmidtChevella [name] ZOX:[id number] DOB: 06/20/1970 DOA: 10/17/2013  PCP: [name], SAAD, MD  Admit date: 10/17/2013 Discharge date: 10/20/2013  Time spent: greater than 30 minutes  Discharge Diagnoses:  Principal Problem:   UTI (lower urinary tract infection) with sepsis syndrome Active Problems:   Dementia   [name] ArenaCreutzfeldt [name] disease Sinus tachycardia   Dysphagia   PEG tube malfunction   Wheezes Bladder stone   Discharge Condition: stable  Filed Weights   10/17/13 1900 10/18/13 0010 10/18/13 2100  Weight: 62.596 kg (138 lb) 66.6 kg (146 lb 13.2 oz) 66.6 kg (146 lb 13.2 oz)    History of present illness:  43 y.o. female from area SNF with history of [place] Gerilyn PilgrimJacob Disease who was sent to the ED due to PEG tube problems and had PEG Tube Replacement in the ED and was found to have persistent tachycardia, and a UTI and was retained for admission. Urine Cultures were sent and she was placed on IV Rocephin.   Hospital Course:  UTI with sepsis syndrome: improved on Rocephin and fluid rescusitation. One out of 3 blood cultures have grown staph epi. Presumed contaminant, so no further treatment needed. Urine culture grew out 70,000 colonies of Proteus which was sensitive to cefazolin. She will be transitioned to cephalexin for a total course of 10 days. She is no longer tachycardic or febrile. Her white blood cell count has normalized. CT of the abdomen and pelvis done on admission showed a bladder stone versus foreign body. Dr. Patsi Searsannenbaum, urology, was consult and and did not recommend surgical extraction do to patient's poor overall medical condition, and she was improving on antibiotics and supportive care.  Patient had a normal chest x-ray on admission but was noted to be wheezing so will be discharged on albuterol nebulizers 4 times a day.  Her G-tube was replaced by interventional radiology. It is currently functioning and patient is tolerating tube feeds.  She is  stable to go back to skilled nursing facility. I recommend ongoing goals of care discussions continued to occur with family, as it appears that patient's quality of life is quite poor. She remains full code, full care at this point.  Procedures:  G-tube replacement  Consultations:  Interventional radiology  Urology  Discharge Exam: Filed Vitals:   10/20/13 0722  BP: 121/91  Pulse: 92  Temp: 98.4 F (36.9 C)  Resp: 24    General: Eyes open. Unresponsive and nonverbal. Appears comfortable. Cardiovascular: Regular rate rhythm without murmurs gallops rubs Respiratory: Mild wheeze with good air movement. Abdomen soft nontender nondistended. G-tube site okay. Extremities warm no clubbing cyanosis. trace edema present..    Medication List         albuterol (2.5 MG/3ML) 0.083% nebulizer solution  Commonly known as:  PROVENTIL  Take 3 mLs (2.5 mg total) by nebulization 4 (four) times daily.     baclofen 10 MG tablet  Commonly known as:  LIORESAL  Place 10 mg into feeding tube 3 (three) times daily.     bisacodyl 10 MG suppository  Commonly known as:  DULCOLAX  Place 10 mg rectally every other day as needed for moderate constipation.     cephALEXin 250 MG/5ML suspension  Commonly known as:  KEFLEX  Place 10 mLs (500 mg total) into feeding tube every 12 (twelve) hours. Through 10/26/13     cetirizine 10 MG tablet  Commonly known as:  ZYRTEC  Give 10 mg by tube daily.     feeding supplement (JEVITY 1.5  CAL) Liqd  Place 50 mL/hr into feeding tube daily.     free water Soln  Place 150 mLs into feeding tube every 4 (four) hours.     guaiFENesin 100 MG/5ML Soln  Commonly known as:  ROBITUSSIN  20 mLs by Gastric Tube route every 6 (six) hours as needed (CONGESTION).     meloxicam 7.5 MG tablet  Commonly known as:  MOBIC  Take 7.5 mg by mouth daily.     omeprazole 2 mg/mL Susp  Commonly known as:  PRILOSEC  Place 20 mg into feeding tube 2 (two) times daily.      polyethylene glycol packet  Commonly known as:  MIRALAX / GLYCOLAX  Give 17 g by tube daily.     rOPINIRole 0.25 MG tablet  Commonly known as:  REQUIP  0.25 mg by PEG Tube route at bedtime.     vitamin B-12 1000 MCG tablet  Commonly known as:  CYANOCOBALAMIN  1,000 mcg by PEG Tube route daily.     Vitamin D-3 5000 UNITS Tabs  Take 5,000 Units by mouth daily. On Monday and Saturday per g-tube       Allergies  Allergen Reactions  . Sulfa Antibiotics Other (See Comments)    ON MAR      The results of significant diagnostics from this hospitalization (including imaging, microbiology, ancillary and laboratory) are listed below for reference.    Significant Diagnostic Studies: Ct Abdomen Pelvis W Contrast  10/17/2013   CLINICAL DATA:  Abdominal pain.  EXAM: CT ABDOMEN AND PELVIS WITH CONTRAST  TECHNIQUE: Multidetector CT imaging of the abdomen and pelvis was performed using the standard protocol following bolus administration of intravenous contrast.  CONTRAST:  OMNIPAQUE IOHEXOL 300 MG/ML  SOLN  COMPARISON:  None.  FINDINGS: Lung bases:  Images are degraded by breathing motion artifact. Dependent atelectasis is noted. No definite pleural effusion.  The solid abdominal organs are grossly normal despite motion artifact. No obvious inflammatory changes or mass lesions. No renal or obstructing ureteral calculi. The gallbladder is surgically absent. No common bowel duct dilatation.  The feeding gastrostomy tube is in good position. No complicating features the duodenum, small bowel and colon are unremarkable. No inflammatory changes, mass lesions or obstructive findings. The appendix is normal. No mesenteric or retroperitoneal mass or adenopathy. The aorta and branch vessels are patent. The major venous structures are patent.  CT pelvis:  The uterus and ovaries are unremarkable. The bladder appears normal. No pelvic mass, adenopathy or free pelvic fluid collections. No inguinal mass or  adenopathy. There is a radiodensity noted in the bladder. This appears to be a foreign body and not a bladder calculus.  Bony structures:  No significant findings.  IMPRESSION: 1. Feeding gastrostomy tube is in good position without complicating features. No findings for small bowel obstruction. 2. Radiopaque foreign body in the bladder. Alternatively it could be an unusual bladder calculus. 3. No acute abdominal/pelvic findings, mass lesions or adenopathy.   Electronically Signed   By: Loralie Champagne M.D.   On: 10/17/2013 22:49   Ir Replc Gastro/colonic Tube Percut W/fluoro  10/18/2013   CLINICAL DATA:  Dislodged gastrostomy tube.  EXAM: GASTROSTOMY TUBE EXCHANGE  CONTRAST:  10 ml Omnipaque-300  FLUOROSCOPY TIME:  18 seconds.  PROCEDURE: Attempt was made to advance an 17 French gastrostomy tube via a pre-existing tract. A 5 French catheter was advanced through the tract and into the stomach. Contrast was injected. A new 18 French balloon retention gastrostomy tube was  then advanced over the wire and into the stomach. The retention balloon was inflated with 8 mL of saline. The tube was injected with contrast material and a fluoroscopic spot image obtained.  COMPLICATIONS: None.  FINDINGS: After replacement, the tip of the new catheter lies in the body of the stomach.  IMPRESSION: Placement of gastrostomy tube with new 18 French balloon retention catheter. The tip lies in the stomach.   Electronically Signed   By: Irish Lack M.D.   On: 10/18/2013 18:00   Dg Chest Port 1 View  10/17/2013   CLINICAL DATA:  Peg tube displacement.  EXAM: PORTABLE CHEST - 1 VIEW  COMPARISON:  03/13/2013  FINDINGS: Heart size and pulmonary vascularity are normal and the lungs are clear. Markings are slightly accentuated on the left due to the shallow inspiration. No osseous abnormality.  IMPRESSION: No acute abnormalities.   Electronically Signed   By: Geanie Cooley M.D.   On: 10/17/2013 19:23   Dg Abd Portable 1v  10/17/2013    CLINICAL DATA:  G-tube placement.  EXAM: PORTABLE ABDOMEN - 1 VIEW  COMPARISON:  03/13/2013  FINDINGS: The feeding gastrostomy tube appears to be in good position. There is contrast in the stomach and duodenal bulb. No extravasating contrast. The bowel gas pattern is unremarkable. The lung bases are grossly clear.  IMPRESSION: Feeding gastrostomy tube in good position without complicating features.   Electronically Signed   By: Loralie Champagne M.D.   On: 10/17/2013 19:31    Microbiology: Recent Results (from the past 240 hour(s))  URINE CULTURE     Status: None   Collection Time    10/17/13  7:57 PM      Result Value Ref Range Status   Specimen Description URINE, CATHETERIZED   Final   Special Requests NONE   Final   Culture  Setup Time     Final   Value: 10/18/2013 02:09     Performed at [facility] Count     Final   Value: 50,000 COLONIES/ML     Performed at [facility]   Culture     Final   Value: PROTEUS MIRABILIS     Performed at [facility]   Report Status 10/20/2013 FINAL   Final   Organism ID, Bacteria PROTEUS MIRABILIS   Final  CULTURE, BLOOD (ROUTINE X 2)     Status: None   Collection Time    10/17/13  8:20 PM      Result Value Ref Range Status   Specimen Description BLOOD RIGHT UPPER ARM   Final   Special Requests BOTTLES DRAWN AEROBIC AND ANAEROBIC 5CC EACH   Final   Culture  Setup Time     Final   Value: 10/18/2013 00:57     Performed at [facility]   Culture     Final   Value: STAPHYLOCOCCUS SPECIES (COAGULASE NEGATIVE)     Note: THE SIGNIFICANCE OF ISOLATING THIS ORGANISM FROM A SINGLE VENIPUNCTURE CANNOT BE PREDICTED WITHOUT FURTHER CLINICAL AND CULTURE CORRELATION. SUSCEPTIBILITIES AVAILABLE ONLY ON REQUEST.     Note: Gram Stain Report Called to,Read Back By and Verified With: [place] WOOSLEY ON 10/18/2013 AT 8:26P BY Serafina Mitchell     Performed at [facility]   Report Status 10/19/2013 FINAL   Final  MRSA PCR  SCREENING     Status: Abnormal   Collection Time    10/18/13 12:33 AM      Result Value Ref Range  Status   MRSA by PCR RESULT CALLED TO, READ BACK BY AND VERIFIED WITH: (*) NEGATIVE Final   Comment: BUTLER,T RN [phone] AT 0200 SKEEN,P                The GeneXpert MRSA Assay (FDA     approved for NASAL specimens     only), is one component of a     comprehensive MRSA colonization     surveillance program. It is not     intended to diagnose MRSA     infection nor to guide or     monitor treatment for     MRSA infections.     POSITIVE  CULTURE, BLOOD (ROUTINE X 2)     Status: None   Collection Time    10/18/13  1:00 AM      Result Value Ref Range Status   Specimen Description BLOOD LEFT ARM   Final   Special Requests BOTTLES DRAWN AEROBIC AND ANAEROBIC 10CC EACH   Final   Culture  Setup Time     Final   Value: 10/18/2013 08:51     Performed at [facility]   Culture     Final   Value:        BLOOD CULTURE RECEIVED NO GROWTH TO DATE CULTURE WILL BE HELD FOR 5 DAYS BEFORE ISSUING A FINAL NEGATIVE REPORT     Performed at [facility]   Report Status PENDING   Incomplete     Labs: Basic Metabolic Panel:  Recent Labs Lab 10/17/13 2020 10/18/13 0100  NA 141 142  K 4.0 4.4  CL 105 106  CO2 23 23  GLUCOSE 108* 114*  BUN 14 10  CREATININE 0.67 0.61  CALCIUM 8.9 8.4   Liver Function Tests:  Recent Labs Lab 10/17/13 2020  AST 23  ALT 20  ALKPHOS 147*  BILITOT 0.4  PROT 7.7  ALBUMIN 3.5   No results found for this basename: LIPASE, AMYLASE,  in the last 168 hours No results found for this basename: AMMONIA,  in the last 168 hours CBC:  Recent Labs Lab 10/17/13 2020 10/18/13 0100 10/20/13 0545  WBC 14.7* 17.5* 8.3  NEUTROABS 11.2*  --  4.8  HGB 12.7 11.9* 11.8*  HCT 39.5 37.8 37.6  MCV 84.9 85.1 85.1  PLT 337 297 288   Cardiac Enzymes: No results found for this basename: CKTOTAL, CKMB, CKMBINDEX, TROPONINI,  in the last 168 hours BNP: BNP  (last 3 results) No results found for this basename: PROBNP,  in the last 8760 hours CBG:  Recent Labs Lab 10/18/13 0801 10/18/13 1144 10/18/13 1424 10/18/13 1735 10/18/13 2127  GLUCAP 112* 108* 98 89 109*    Signed:  Carlis Blanchard L  [facility] 10/20/2013, 10:41 AM

## 2013-10-20 NOTE — Progress Notes (Signed)
NURSING PROGRESS NOTE  [name] SchmidtChevella [name] [id number] Discharge Data: 10/20/2013 3:54 PM Attending Provider: Christiane Haorinna L Sullivan, MD WGN:FAOZPCP:AMIN, SAAD, MD     [name] Schmidthevella Largent to be D/C'd Skilled nursing facility per MD order.  Report given to SNF. All IV's discontinued with no bleeding noted. G-tube clamped and flushed.   Last Vital Signs:  Blood pressure 126/83, pulse 93, temperature 98.7 F (37.1 C), temperature source Axillary, resp. rate 36, height 5' 8.11" (1.73 m), weight 66.6 kg (146 lb 13.2 oz), SpO2 100.00%.  Discharge Medication List   Medication List         albuterol (2.5 MG/3ML) 0.083% nebulizer solution  Commonly known as:  PROVENTIL  Take 3 mLs (2.5 mg total) by nebulization 4 (four) times daily.     baclofen 10 MG tablet  Commonly known as:  LIORESAL  Place 10 mg into feeding tube 3 (three) times daily.     bisacodyl 10 MG suppository  Commonly known as:  DULCOLAX  Place 10 mg rectally every other day as needed for moderate constipation.     cephALEXin 250 MG/5ML suspension  Commonly known as:  KEFLEX  Place 10 mLs (500 mg total) into feeding tube every 12 (twelve) hours. Through 10/26/13     cetirizine 10 MG tablet  Commonly known as:  ZYRTEC  Give 10 mg by tube daily.     feeding supplement (JEVITY 1.5 CAL) Liqd  Place 50 mL/hr into feeding tube daily.     free water Soln  Place 150 mLs into feeding tube every 4 (four) hours.     guaiFENesin 100 MG/5ML Soln  Commonly known as:  ROBITUSSIN  20 mLs by Gastric Tube route every 6 (six) hours as needed (CONGESTION).     meloxicam 7.5 MG tablet  Commonly known as:  MOBIC  Take 7.5 mg by mouth daily.     omeprazole 2 mg/mL Susp  Commonly known as:  PRILOSEC  Place 20 mg into feeding tube 2 (two) times daily.     polyethylene glycol packet  Commonly known as:  MIRALAX / GLYCOLAX  Give 17 g by tube daily.     rOPINIRole 0.25 MG tablet  Commonly known as:  REQUIP  0.25 mg by PEG Tube route at bedtime.     vitamin B-12 1000 MCG tablet  Commonly known as:  CYANOCOBALAMIN  1,000 mcg by PEG Tube route daily.     Vitamin D-3 5000 UNITS Tabs  Take 5,000 Units by mouth daily. On Monday and Saturday per g-tube         Cathlyn Parsonsattha Alford Gamero, RN

## 2013-10-20 NOTE — Clinical Social Work Note (Addendum)
Per MD [facility]. RN, patient/family (patient's sister notified by voicemail, CSW was unable to reach sister by phone), and facility notified of patient's DC. RN given number for report. DC packet on patient's chart. Ambulance transport requested for patient for 3:15pm. CSW signing off at this time.   UPDATE: CSW made a total of three attempts today to contact family members of patient to notify them of patient's [facility] without success. CSW left messages on phone numbers listed stating that [facility] today. No family present at bedside. CSW signing off.   Roddie McBryant [name] [name] MSW, [place]CSWA, [place], [phone]

## 2013-10-21 NOTE — ED Provider Notes (Signed)
I saw and evaluated the patient, reviewed the resident's note and I agree with the findings and plan.   EKG Interpretation None     Patient sent from nursing home for reported overfeeding with G-tube. Thank tube came out and they have been overfed. There was a ruptured balloon. Tachycardic and febrile.  she has a UTI. She'll be admitted to internal medicine   Juliet RudeNathan R. Rubin PayorPickering, MD 10/21/13 [phone]

## 2013-10-24 LAB — CULTURE, BLOOD (ROUTINE X 2): Culture: NO GROWTH

## 2015-01-21 ENCOUNTER — Emergency Department (HOSPITAL_COMMUNITY): Payer: Medicare Other

## 2015-01-21 ENCOUNTER — Inpatient Hospital Stay (HOSPITAL_COMMUNITY)
Admission: EM | Admit: 2015-01-21 | Discharge: 2015-01-29 | DRG: 871 | Disposition: A | Discharge status: Skilled Nursing Facility | Payer: Medicare Other | Attending: Oncology | Admitting: Oncology

## 2015-01-21 DIAGNOSIS — R4020 Unspecified coma: Secondary | ICD-10-CM | POA: Diagnosis present

## 2015-01-21 DIAGNOSIS — A411 Sepsis due to other specified staphylococcus: Secondary | ICD-10-CM | POA: Diagnosis present

## 2015-01-21 DIAGNOSIS — A8100 Creutzfeldt-Jakob disease, unspecified: Secondary | ICD-10-CM | POA: Diagnosis present

## 2015-01-21 DIAGNOSIS — Y95 Nosocomial condition: Secondary | ICD-10-CM | POA: Diagnosis present

## 2015-01-21 DIAGNOSIS — E538 Deficiency of other specified B group vitamins: Secondary | ICD-10-CM | POA: Diagnosis present

## 2015-01-21 DIAGNOSIS — G3109 Other frontotemporal dementia: Secondary | ICD-10-CM | POA: Diagnosis present

## 2015-01-21 DIAGNOSIS — K5900 Constipation, unspecified: Secondary | ICD-10-CM | POA: Diagnosis present

## 2015-01-21 DIAGNOSIS — Z79899 Other long term (current) drug therapy: Secondary | ICD-10-CM

## 2015-01-21 DIAGNOSIS — R4702 Dysphasia: Secondary | ICD-10-CM | POA: Diagnosis present

## 2015-01-21 DIAGNOSIS — J189 Pneumonia, unspecified organism: Secondary | ICD-10-CM | POA: Diagnosis present

## 2015-01-21 DIAGNOSIS — B952 Enterococcus as the cause of diseases classified elsewhere: Secondary | ICD-10-CM | POA: Diagnosis present

## 2015-01-21 DIAGNOSIS — F0391 Unspecified dementia with behavioral disturbance: Secondary | ICD-10-CM | POA: Diagnosis not present

## 2015-01-21 DIAGNOSIS — F0280 Dementia in other diseases classified elsewhere without behavioral disturbance: Secondary | ICD-10-CM | POA: Diagnosis present

## 2015-01-21 DIAGNOSIS — G2581 Restless legs syndrome: Secondary | ICD-10-CM | POA: Diagnosis present

## 2015-01-21 DIAGNOSIS — E876 Hypokalemia: Secondary | ICD-10-CM | POA: Diagnosis not present

## 2015-01-21 DIAGNOSIS — B957 Other staphylococcus as the cause of diseases classified elsewhere: Secondary | ICD-10-CM | POA: Diagnosis not present

## 2015-01-21 DIAGNOSIS — Z882 Allergy status to sulfonamides status: Secondary | ICD-10-CM

## 2015-01-21 DIAGNOSIS — N390 Urinary tract infection, site not specified: Secondary | ICD-10-CM | POA: Diagnosis present

## 2015-01-21 DIAGNOSIS — Z993 Dependence on wheelchair: Secondary | ICD-10-CM | POA: Diagnosis not present

## 2015-01-21 DIAGNOSIS — A419 Sepsis, unspecified organism: Secondary | ICD-10-CM | POA: Diagnosis present

## 2015-01-21 DIAGNOSIS — R7881 Bacteremia: Secondary | ICD-10-CM

## 2015-01-21 DIAGNOSIS — Z931 Gastrostomy status: Secondary | ICD-10-CM

## 2015-01-21 DIAGNOSIS — Z2239 Carrier of other specified bacterial diseases: Secondary | ICD-10-CM | POA: Diagnosis not present

## 2015-01-21 DIAGNOSIS — R6520 Severe sepsis without septic shock: Secondary | ICD-10-CM

## 2015-01-21 HISTORY — DX: Enterococcus as the cause of diseases classified elsewhere: B95.2

## 2015-01-21 HISTORY — DX: Urinary tract infection, site not specified: N39.0

## 2015-01-21 HISTORY — DX: Sepsis due to other specified Staphylococcus: A41.1

## 2015-01-21 LAB — URINE MICROSCOPIC-ADD ON

## 2015-01-21 LAB — CBC WITH DIFFERENTIAL/PLATELET
BASOS PCT: 0 %
Basophils Absolute: 0 10*3/uL (ref 0.0–0.1)
EOS ABS: 0 10*3/uL (ref 0.0–0.7)
Eosinophils Relative: 0 %
HCT: 44 % (ref 36.0–46.0)
Hemoglobin: 14.1 g/dL (ref 12.0–15.0)
Lymphocytes Relative: 21 %
Lymphs Abs: 4 10*3/uL (ref 0.7–4.0)
MCH: 28.3 pg (ref 26.0–34.0)
MCHC: 32 g/dL (ref 30.0–36.0)
MCV: 88.4 fL (ref 78.0–100.0)
MONO ABS: 1.1 10*3/uL — AB (ref 0.1–1.0)
Monocytes Relative: 6 %
NEUTROS PCT: 73 %
Neutro Abs: 13.8 10*3/uL — ABNORMAL HIGH (ref 1.7–7.7)
PLATELETS: 227 10*3/uL (ref 150–400)
RBC: 4.98 MIL/uL (ref 3.87–5.11)
RDW: 14.7 % (ref 11.5–15.5)
WBC: 19 10*3/uL — ABNORMAL HIGH (ref 4.0–10.5)

## 2015-01-21 LAB — COMPREHENSIVE METABOLIC PANEL
ALBUMIN: 3.7 g/dL (ref 3.5–5.0)
ALK PHOS: 115 U/L (ref 38–126)
ALT: 32 U/L (ref 14–54)
AST: 24 U/L (ref 15–41)
Anion gap: 10 (ref 5–15)
BUN: 28 mg/dL — ABNORMAL HIGH (ref 6–20)
CALCIUM: 9.1 mg/dL (ref 8.9–10.3)
CO2: 24 mmol/L (ref 22–32)
CREATININE: 0.82 mg/dL (ref 0.44–1.00)
Chloride: 110 mmol/L (ref 101–111)
GFR calc non Af Amer: >60 mL/min (ref >60)
GLUCOSE: 109 mg/dL — AB (ref 65–99)
Potassium: 4 mmol/L (ref 3.5–5.1)
SODIUM: 144 mmol/L (ref 135–145)
Total Bilirubin: 0.5 mg/dL (ref 0.3–1.2)
Total Protein: 8.5 g/dL — ABNORMAL HIGH (ref 6.5–8.1)

## 2015-01-21 LAB — URINALYSIS, ROUTINE W REFLEX MICROSCOPIC
Bilirubin Urine: NEGATIVE
GLUCOSE, UA: NEGATIVE mg/dL
Ketones, ur: 15 mg/dL — AB
Nitrite: NEGATIVE
Protein, ur: >300 mg/dL — AB
SPECIFIC GRAVITY, URINE: 1.045 — AB (ref 1.005–1.030)
pH: 5 (ref 5.0–8.0)

## 2015-01-21 LAB — INFLUENZA PANEL BY PCR (TYPE A & B)
H1N1FLUPCR: NOT DETECTED
Influenza A By PCR: NEGATIVE
Influenza B By PCR: NEGATIVE

## 2015-01-21 LAB — I-STAT CG4 LACTIC ACID, ED: LACTIC ACID, VENOUS: 1.74 mmol/L (ref 0.5–2.0)

## 2015-01-21 MED ORDER — ROPINIROLE HCL 0.25 MG PO TABS
0.2500 mg | ORAL_TABLET | Freq: Every day | ORAL | Status: DC
  Administered 2015-01-22 – 2015-01-28 (×7): 0.25 mg
  Filled 2015-01-21 (×9): qty 1

## 2015-01-21 MED ORDER — BACLOFEN 10 MG PO TABS
10.0000 mg | ORAL_TABLET | Freq: Three times a day (TID) | ORAL | Status: DC
  Administered 2015-01-22 – 2015-01-29 (×23): 10 mg
  Filled 2015-01-21 (×27): qty 1

## 2015-01-21 MED ORDER — BISACODYL 10 MG RE SUPP
10.0000 mg | RECTAL | Status: DC | PRN
  Administered 2015-01-25: 10 mg via RECTAL
  Filled 2015-01-21: qty 1

## 2015-01-21 MED ORDER — LORATADINE 10 MG PO TABS
10.0000 mg | ORAL_TABLET | Freq: Every day | ORAL | Status: DC
  Administered 2015-01-22 – 2015-01-29 (×9): 10 mg
  Filled 2015-01-21 (×10): qty 1

## 2015-01-21 MED ORDER — HYDROCODONE-ACETAMINOPHEN 5-325 MG PO TABS
1.0000 | ORAL_TABLET | ORAL | Status: DC
  Administered 2015-01-22 – 2015-01-29 (×15): 1 via ORAL
  Filled 2015-01-21 (×15): qty 1

## 2015-01-21 MED ORDER — VANCOMYCIN HCL IN DEXTROSE 1-5 GM/200ML-% IV SOLN
1000.0000 mg | Freq: Once | INTRAVENOUS | Status: AC
  Administered 2015-01-21: 1000 mg via INTRAVENOUS
  Filled 2015-01-21: qty 200

## 2015-01-21 MED ORDER — ADULT MULTIVITAMIN LIQUID CH
5.0000 mL | Freq: Every day | ORAL | Status: DC
  Administered 2015-01-22 – 2015-01-27 (×6): 5 mL
  Filled 2015-01-21 (×9): qty 5

## 2015-01-21 MED ORDER — PIPERACILLIN-TAZOBACTAM 3.375 G IVPB
3.3750 g | Freq: Three times a day (TID) | INTRAVENOUS | Status: DC
  Administered 2015-01-22 – 2015-01-24 (×8): 3.375 g via INTRAVENOUS
  Filled 2015-01-21 (×10): qty 50

## 2015-01-21 MED ORDER — SODIUM CHLORIDE 0.9 % IV BOLUS (SEPSIS)
1000.0000 mL | INTRAVENOUS | Status: AC
  Administered 2015-01-21 (×2): 1000 mL via INTRAVENOUS

## 2015-01-21 MED ORDER — PIPERACILLIN-TAZOBACTAM 3.375 G IVPB 30 MIN
3.3750 g | Freq: Once | INTRAVENOUS | Status: AC
  Administered 2015-01-21: 3.375 g via INTRAVENOUS
  Filled 2015-01-21: qty 50

## 2015-01-21 MED ORDER — MELOXICAM 7.5 MG PO TABS
7.5000 mg | ORAL_TABLET | Freq: Every day | ORAL | Status: DC
  Administered 2015-01-22 – 2015-01-28 (×7): 7.5 mg
  Filled 2015-01-21 (×8): qty 1

## 2015-01-21 MED ORDER — SODIUM CHLORIDE 0.9 % IV SOLN
INTRAVENOUS | Status: AC
  Administered 2015-01-22 (×2): via INTRAVENOUS

## 2015-01-21 MED ORDER — MORPHINE SULFATE 10 MG/5ML PO SOLN: 5.0000 mg | ORAL | Status: DC | PRN

## 2015-01-21 MED ORDER — LORAZEPAM 1 MG PO TABS: 1.0000 mg | ORAL_TABLET | ORAL | Status: DC

## 2015-01-21 MED ORDER — VANCOMYCIN HCL IN DEXTROSE 750-5 MG/150ML-% IV SOLN
750.0000 mg | Freq: Three times a day (TID) | INTRAVENOUS | Status: DC
  Administered 2015-01-22 (×2): 750 mg via INTRAVENOUS
  Filled 2015-01-21 (×4): qty 150

## 2015-01-21 MED ORDER — SODIUM CHLORIDE 0.9 % IV SOLN
INTRAVENOUS | Status: DC
Start: 2015-01-21 — End: 2015-01-21

## 2015-01-21 MED ORDER — FREE WATER
175.0000 mL | Status: DC
  Administered 2015-01-22 – 2015-01-23 (×10): 175 mL

## 2015-01-21 MED ORDER — VITAMIN B-12 1000 MCG PO TABS
1000.0000 ug | ORAL_TABLET | Freq: Every day | ORAL | Status: DC
  Administered 2015-01-22 – 2015-01-29 (×8): 1000 ug
  Filled 2015-01-21 (×9): qty 1

## 2015-01-21 MED ORDER — ALBUTEROL SULFATE (2.5 MG/3ML) 0.083% IN NEBU
2.5000 mg | INHALATION_SOLUTION | Freq: Four times a day (QID) | RESPIRATORY_TRACT | Status: DC
  Administered 2015-01-21 – 2015-01-22 (×3): 2.5 mg via RESPIRATORY_TRACT
  Filled 2015-01-21 (×3): qty 3

## 2015-01-21 MED ORDER — HYDROCODONE-ACETAMINOPHEN 5-325 MG PO TABS: 1.0000 | ORAL_TABLET | ORAL | Status: DC

## 2015-01-21 MED ORDER — MORPHINE SULFATE (CONCENTRATE) 10 MG/0.5ML PO SOLN
5.0000 mg | ORAL | Status: DC | PRN
  Administered 2015-01-22 – 2015-01-28 (×8): 5 mg via ORAL
  Filled 2015-01-21 (×8): qty 0.5

## 2015-01-21 MED ORDER — ACETAMINOPHEN 650 MG RE SUPP
650.0000 mg | Freq: Once | RECTAL | Status: AC
  Administered 2015-01-21: 650 mg via RECTAL
  Filled 2015-01-21: qty 1

## 2015-01-21 MED ORDER — POLYETHYLENE GLYCOL 3350 17 G PO PACK
17.0000 g | PACK | Freq: Every day | ORAL | Status: DC
  Administered 2015-01-22 – 2015-01-29 (×8): 17 g
  Filled 2015-01-21 (×9): qty 1

## 2015-01-21 MED ORDER — CYANOCOBALAMIN 500 MCG PO TABS: 500.0000 ug | ORAL_TABLET | Freq: Every day | ORAL | Status: DC

## 2015-01-21 MED ORDER — ESOMEPRAZOLE MAGNESIUM 40 MG PO PACK: 40.0000 mg | PACK | Freq: Every day | ORAL | Status: DC

## 2015-01-21 MED ORDER — PANTOPRAZOLE SODIUM 40 MG PO PACK
40.0000 mg | PACK | Freq: Every day | ORAL | Status: DC
  Administered 2015-01-22 – 2015-01-29 (×8): 40 mg
  Filled 2015-01-21 (×8): qty 20

## 2015-01-21 MED ORDER — SUCRALFATE 1 GM/10ML PO SUSP
1.0000 g | ORAL | Status: DC
  Administered 2015-01-22 – 2015-01-29 (×28): 1 g
  Filled 2015-01-21 (×35): qty 10

## 2015-01-21 MED ORDER — LORATADINE 10 MG PO TABS: 10.0000 mg | ORAL_TABLET | Freq: Every day | ORAL | Status: DC

## 2015-01-21 MED ORDER — ENOXAPARIN SODIUM 40 MG/0.4ML ~~LOC~~ SOLN
40.0000 mg | SUBCUTANEOUS | Status: DC
  Administered 2015-01-22 – 2015-01-27 (×7): 40 mg via SUBCUTANEOUS
  Filled 2015-01-21 (×8): qty 0.4

## 2015-01-21 NOTE — Consult Note (Addendum)
ANTIBIOTIC CONSULT NOTE - INITIAL  Pharmacy Consult for Vancomycin and Zosyn Indication: sepsis/pneumonia  Allergies  Allergen Reactions  . Sulfa Antibiotics Other (See Comments)    ON MAR    Patient Measurements: Height: 5' 8.11" (173 cm) Weight: 145 lb (65.772 kg) IBW/kg (Calculated) : 64.15  Vital Signs: Temp: 100.8 F (38.2 C) (11/19 1530) Temp Source: Oral (11/19 1530) BP: 121/88 mmHg (11/19 1530) Pulse Rate: 127 (11/19 1530) Intake/Output from previous day:   Intake/Output from this shift:    Labs: No results for input(s): WBC, HGB, PLT, LABCREA, CREATININE in the last 72 hours.  Microbiology: No results found for this or any previous visit (from the past 720 hour(s)).  Medical History: Past Medical History  Diagnosis Date  . Dementia   . Restless leg syndrome   . Esophageal reflux   . UTI (urinary tract infection)   . Depression   . Pneumonia   . Constipation   . Aphagia   . Bacteremia   . Sepsis   . Lenise ArenaCreutzfeldt Jakob disease   . Vitamin B12 deficiency     Assessment: 44yof who had [facility] that came back positive for pneumonia. She received 1g IM ceftriaxone outpatient. She was hospitalized 3 months ago for UTI/sepsis syndrome. Now also with a fever to 100.8. Code sepsis called here in the ED. She will begin vancomycin and zosyn. Renal function is a little up from a year ago (was ~0.6, now 0.82).  Goal of Therapy:  Vancomycin trough level 15-20 mcg/ml  Plan:  1) Vancomycin 1g IV x 1 then 750mg  IV q8 2) Zosyn 3.375g IV q8 (4 hour infusion) 3) Follow renal function, cultures, LOT, level if needed  Fredrik RiggerMarkle, Jennifer Sue 01/21/2015,4:27 PM ____________________________________________________ Rica MoteADDENDUM:  Based on renal function and weight will change vancomycin 750 mg from q8h to q12h.  Sandi CarneNick Shadavia Dampier, PharmD Pharmacy Resident Pager: [phone]

## 2015-01-21 NOTE — ED Provider Notes (Signed)
CSN: [id number]     Arrival date & time 01/21/15  1519 History   First MD Initiated Contact with Patient 01/21/15 1601     Chief Complaint  Patient presents with  . Pneumonia     (Consider location/radiation/quality/duration/timing/severity/associated sxs/prior Treatment) The history is provided by the patient.  [name] [name] is a 44 y.o. female hx of dementia, Lenise ArenaCreutzfeldt Jakob disease, here presenting with pneumonia. Patient actually resides in the nursing home and is nonverbal at baseline. Patient actually has a PEG tube. Has been coughing and congestion for several days. Had a chest x-ray done several days ago that showed pneumonia and was given 1 dose of IM ceftriaxone. The chest x-ray result came back today and family visited her today and brought her here for evaluation. Patient is unable to give any history at all. Patient was admitted 3 months ago for pneumonia.  Level  V caveat- dementia    Past Medical History  Diagnosis Date  . Dementia   . Restless leg syndrome   . Esophageal reflux   . UTI (urinary tract infection)   . Depression   . Pneumonia   . Constipation   . Aphagia   . Bacteremia   . Sepsis   . Lenise ArenaCreutzfeldt Jakob disease   . Vitamin B12 deficiency    Past Surgical History  Procedure Laterality Date  . Peg tube placement      [address] [place]  . Cholecystectomy     No family history on file. Social History  Substance Use Topics  . Smoking status: Never Smoker   . Smokeless tobacco: Not on file  . Alcohol Use: No   OB History    No data available     Review of Systems  Unable to perform ROS: Dementia  Respiratory: Positive for cough.   All other systems reviewed and are negative.     Allergies  Sulfa antibiotics  Home Medications   Prior to Admission medications   Medication Sig Start Date End Date Taking? Authorizing Provider  Water For Irrigation, Sterile (FREE WATER) SOLN Place 150 mLs into feeding tube every 4 (four) hours.   Yes  Historical Provider, MD  albuterol (PROVENTIL) (2.5 MG/3ML) 0.083% nebulizer solution Take 3 mLs (2.5 mg total) by nebulization 4 (four) times daily. 10/20/13   Christiane Haorinna L Sullivan, MD  baclofen (LIORESAL) 10 MG tablet Place 10 mg into feeding tube 3 (three) times daily.    Historical Provider, MD  bisacodyl (DULCOLAX) 10 MG suppository Place 10 mg rectally every other day as needed for moderate constipation.    Historical Provider, MD  cephALEXin (KEFLEX) 250 MG/5ML suspension Place 10 mLs (500 mg total) into feeding tube every 12 (twelve) hours. Through 10/26/13 10/20/13   Christiane Haorinna L Sullivan, MD  cetirizine (ZYRTEC) 10 MG tablet Give 10 mg by tube daily.    Historical Provider, MD  Cholecalciferol (VITAMIN D-3) 5000 UNITS TABS Take 5,000 Units by mouth daily. On Monday and Saturday per g-tube    Historical Provider, MD  guaiFENesin (ROBITUSSIN) 100 MG/5ML SOLN 20 mLs by Gastric Tube route every 6 (six) hours as needed (CONGESTION).    Historical Provider, MD  meloxicam (MOBIC) 7.5 MG tablet Take 7.5 mg by mouth daily.    Historical Provider, MD  Nutritional Supplements (FEEDING SUPPLEMENT, JEVITY 1.5 CAL,) LIQD Place 50 mL/hr into feeding tube daily.     Historical Provider, MD  omeprazole (PRILOSEC) 2 mg/mL SUSP Place 20 mg into feeding tube 2 (two) times daily.  Historical Provider, MD  polyethylene glycol (MIRALAX / GLYCOLAX) packet Give 17 g by tube daily.    Historical Provider, MD  rOPINIRole (REQUIP) 0.25 MG tablet 0.25 mg by PEG Tube route at bedtime.     Historical Provider, MD  vitamin B-12 (CYANOCOBALAMIN) 1000 MCG tablet 1,000 mcg by PEG Tube route daily.    Historical Provider, MD   BP 98/75 mmHg  Pulse 115  Temp(Src) 100.8 F (38.2 C) (Oral)  Resp 29  Ht 5' 8.11" (1.73 m)  Wt 145 lb (65.772 kg)  BMI 21.98 kg/m2  SpO2 100% Physical Exam  Constitutional:  Chronically ill appearing, dehydrated   HENT:  Head: Normocephalic.  MM dry   Eyes: Conjunctivae are normal. Pupils are  equal, round, and reactive to light.  Neck: Normal range of motion. Neck supple.  Cardiovascular:  Tachy, regular   Pulmonary/Chest:  +crackles bilaterally   Abdominal: Soft. Bowel sounds are normal. She exhibits no distension. There is no tenderness. There is no rebound.  Peg tube in place   Musculoskeletal: Normal range of motion. She exhibits no edema or tenderness.  Neurological:  Tired, arousable. Contracted (baseline)   Skin: Skin is warm and dry.  Psychiatric:  Unable   Nursing note and vitals reviewed.   ED Course  Procedures (including critical care time) Labs Review Labs Reviewed  COMPREHENSIVE METABOLIC PANEL - Abnormal; Notable for the following:    Glucose, Bld 109 (*)    BUN 28 (*)    Total Protein 8.5 (*)    All other components within normal limits  CBC WITH DIFFERENTIAL/PLATELET - Abnormal; Notable for the following:    WBC 19.0 (*)    Neutro Abs 13.8 (*)    Monocytes Absolute 1.1 (*)    All other components within normal limits  URINALYSIS, ROUTINE W REFLEX MICROSCOPIC (NOT AT [facility]) - Abnormal; Notable for the following:    Color, Urine AMBER (*)    APPearance CLOUDY (*)    Specific Gravity, Urine 1.045 (*)    Hgb urine dipstick LARGE (*)    Ketones, ur 15 (*)    Protein, ur >300 (*)    Leukocytes, UA SMALL (*)    All other components within normal limits  URINE MICROSCOPIC-ADD ON - Abnormal; Notable for the following:    Squamous Epithelial / LPF 0-5 (*)    Bacteria, UA FEW (*)    All other components within normal limits  CULTURE, BLOOD (ROUTINE X 2)  CULTURE, BLOOD (ROUTINE X 2)  URINE CULTURE  INFLUENZA PANEL BY PCR (TYPE A & B, H1N1)  I-STAT CG4 LACTIC ACID, ED    Imaging Review Dg Chest Port 1 View  01/21/2015  CLINICAL DATA:  Cough and fever.  Dementia.  Unresponsive. EXAM: PORTABLE CHEST 1 VIEW COMPARISON:  10/17/2013 FINDINGS: Patient moderately rotated to the right. Normal heart size. No pleural effusion or pneumothorax. Low lung  volumes with resultant pulmonary interstitial prominence. Mild left base atelectasis. Cannot exclude right lower lobe airspace disease medially. IMPRESSION: Suboptimal patient positioning, including moderate obliquity. Possible medial right lower lobe airspace disease. Consider rib repeat radiograph if patient is able to be placed less oblique (as on the 10/17/2013 exam). Electronically Signed   By: Jeronimo Greaves M.D.   On: 01/21/2015 17:05   I have personally reviewed and evaluated these images and lab results as part of my medical decision-making.   EKG Interpretation   Date/Time:  Saturday January 21 2015 16:36:41 EST Ventricular Rate:  120 PR Interval:  120 QRS Duration: 73 QT Interval:  352 QTC Calculation: 497 R Axis:   56 Text Interpretation:  Sinus tachycardia Borderline prolonged QT interval  No significant change since last tracing Confirmed by Ali Mclaurin  MD, Sharifa Bucholz  ([id number]) on 01/21/2015 4:55:02 PM      MDM   Final diagnoses:  None    [name] [name] is a 44 y.o. female here with pneumonia on CXR. Febrile, tachy, not hypotensive. Code sepsis activated. Since she is from facility, also consider flu. Will get labs, lactate, culture, CXR, UA, flu.    5:47 PM Given 30 cc/kg bolus. BP upper 90s. Tachycardia improved. CXR showed pneumonia. WBC 19. Given vanc/zosyn. Will admit to stepdown.     Richardean Canal, MD 01/21/15 [phone]

## 2015-01-21 NOTE — ED Notes (Signed)
Attempted report 

## 2015-01-21 NOTE — ED Notes (Signed)
Admitting MD at bedside.

## 2015-01-21 NOTE — ED Notes (Signed)
Pt here via EMS from [facility]. Pt had CXR done several days ago that came back today positive for pneumonia. Pt is non verbal at baseline, and is, per EMS, at baseline now. Pt has audible rhonchi, fever, cough.

## 2015-01-21 NOTE — ED Notes (Signed)
MD yao at bedside.  

## 2015-01-21 NOTE — H&P (Signed)
Date: 01/21/2015               Patient Name:  [name] [name] MRN: [id number]  DOB: 1970-04-03 Age / Sex: 44 y.o., female   PCP: [name], MD         Medical Service: Internal Medicine Teaching Service         Attending Physician: Dr. Tyson Alias, MD    First Contact: Dr. Loney Loh  Pager: [phone]  Second Contact: Dr. Tasia Catchings  Pager: [phone]       After Hours (After 5p/  First Contact Pager: [phone]  weekends / holidays): Second Contact Pager: [phone]   Chief Complaint:   History of Present Illness: 44 y.o. female with a past medical hx of dementia, [name] disease, here presenting with pneumonia. Patient actually resides in the nursing home and is nonverbal at baseline. Patient actually has a PEG tube. Has been coughing and congestion for several days. Had a chest x-ray done several days ago that showed pneumonia and was given 1 dose of IM ceftriaxone. The chest x-ray result came back today and family visited her today and brought her here for evaluation. Patient is unable to give any history at all. Patient was admitted 3 months ago for pneumonia. Note: Patient is non-verbal. History above obtained from ED provider note. We tried calling her HPOA (sister) but could no reach her over the phone.   As per notes from Neurology at [facility], patient was doing fairly well until 2009. At that time she developed some psychiatric symptoms and psychosis. She subsequently developed concentration memory problems and motor symptoms. She also had insomnia and significant muscle jerking. Symptoms progressed rapidly at that time. Patient became wheelchair bound in 2013 and has been requiring total care. No history of head trauma. Patient resides at [facility] nursing home. She has a feeding tube.    Meds: Current Facility-Administered Medications  Medication Dose Route Frequency Provider Last Rate Last Dose  . [START ON 01/22/2015] piperacillin-tazobactam (ZOSYN) IVPB 3.375 g  3.375 g  Intravenous 3 times per day Emi Holes, [facility]      . [START ON 01/22/2015] vancomycin (VANCOCIN) IVPB 750 mg/150 ml premix  750 mg Intravenous Q8H Emi Holes, [facility]       Current Outpatient Prescriptions  Medication Sig Dispense Refill  . albuterol (PROVENTIL) (2.5 MG/3ML) 0.083% nebulizer solution Take 3 mLs (2.5 mg total) by nebulization 4 (four) times daily. (Patient taking differently: Take 2.5 mg by nebulization every 6 (six) hours as needed for wheezing. ) 75 mL 12  . baclofen (LIORESAL) 10 MG tablet 10 mg by PEG Tube route 3 (three) times daily. 9am, 2pm, 9pm    . bisacodyl (DULCOLAX) 10 MG suppository Place 10 mg rectally every other day as needed for moderate constipation.    . cefTRIAXone (ROCEPHIN) 1 G injection Inject 1 g into the muscle once.    . cetirizine (ZYRTEC) 10 MG tablet 10 mg by PEG Tube route daily.     . Cholecalciferol (VITAMIN D-3) 5000 UNITS TABS 5,000 Units by PEG Tube route 2 (two) times a week. On Monday and Saturday    . cyanocobalamin 500 MCG tablet 500 mcg by PEG Tube route daily.    Marland Kitchen esomeprazole (NEXIUM) 40 MG packet 40 mg by PEG Tube route daily.    Marland Kitchen guaiFENesin (ROBITUSSIN) 100 MG/5ML SOLN 400 mg by PEG Tube route every 6 (six) hours as needed (congestion).     Marland Kitchen HYDROcodone-acetaminophen (NORCO/VICODIN) 5-325 MG tablet  Take 1 tablet by mouth See admin instructions. Take 1 tablet via peg tube twice daily at 9am and 6pm, may also take 1 tablet every 8 hours as needed for pain    . LORazepam (ATIVAN) 1 MG tablet 1 mg by PEG Tube route See admin instructions. Give 1 tablet (1 mg) via peg tube 30 minutes prior to dental appointment    . meloxicam (MOBIC) 7.5 MG tablet 7.5 mg by PEG Tube route daily.     Marland Kitchen morphine (ROXANOL) 20 MG/ML concentrated solution Place 5 mg inside cheek every 2 (two) hours as needed (pain/ shortness of breath/ air hunger).    . Multiple Vitamin (MULTIVITAMIN) LIQD 5 mLs by PEG Tube route daily.    . polyethylene glycol (MIRALAX  / GLYCOLAX) packet 17 g by PEG Tube route daily.     Marland Kitchen PRESCRIPTION MEDICATION by PEG Tube route continuous. Enteral Feed order Two Cal HN 30 ml/hr    . rOPINIRole (REQUIP) 0.25 MG tablet 0.25 mg by PEG Tube route daily at 6 PM.     . Saccharomyces boulardii (FLORASTOR PO) 1 capsule by PEG Tube route daily.    . sucralfate (CARAFATE) 1 GM/10ML suspension 1 g by PEG Tube route 4 (four) times daily. 9am, 2pm, 4pm, 8pm    . vitamin B-12 (CYANOCOBALAMIN) 1000 MCG tablet 1,000 mcg by PEG Tube route daily.    . Water For Irrigation, Sterile (FREE WATER) SOLN 175 mLs by PEG Tube route every 4 (four) hours.       Allergies: Allergies as of 01/21/2015 - Review Complete 01/21/2015  Allergen Reaction Noted  . Sulfa antibiotics Other (See Comments) 02/07/2013   Past Medical History  Diagnosis Date  . Dementia   . Restless leg syndrome   . Esophageal reflux   . UTI (urinary tract infection)   . Depression   . Pneumonia   . Constipation   . Aphagia   . Bacteremia   . Sepsis   . [name] disease   . Vitamin B12 deficiency    Past Surgical History  Procedure Laterality Date  . Peg tube placement      [address] [place]  . Cholecystectomy     No family history on file. Social History   Social History  . Marital Status: Single    Spouse Name: N/A  . Number of Children: N/A  . Years of Education: N/A   Occupational History  . Not on file.   Social History Main Topics  . Smoking status: Never Smoker   . Smokeless tobacco: Not on file  . Alcohol Use: No  . Drug Use: No  . Sexual Activity: Not on file   Other Topics Concern  . Not on file   Social History Narrative  . No narrative on file    Review of Systems: Could not be obtained as patient is non-verbal.   Physical Exam: Blood pressure 114/71, pulse 107, temperature 100.8 F (38.2 C), temperature source Oral, resp. rate 26, height 5' 8.11" (1.73 m), weight 145 lb (65.772 kg), SpO2 100 %. Physical Exam  Constitutional:   Decorticate positioning Drooling  Eyes:  Pupils equal but slow to react to light   Neck: Neck supple. No tracheal deviation present.  Cardiovascular: Regular rhythm and intact distal pulses.   Tachycardic   Pulmonary/Chest:  Diffuse crackles and end expiratory wheezing  Abdominal: Soft. Bowel sounds are normal. She exhibits no distension. There is no guarding.  PEG tube site appears clean and dry. Does  not appear infected.   Musculoskeletal: She exhibits no edema.  Neurological:  Nonverbal Opening eyes spontaneously   Skin: Skin is warm and dry.    Lab results: Basic Metabolic Panel:  Recent Labs  16/10/96 1555  NA 144  K 4.0  CL 110  CO2 24  GLUCOSE 109*  BUN 28*  CREATININE 0.82  CALCIUM 9.1   Liver Function Tests:  Recent Labs  01/21/15 1555  AST 24  ALT 32  ALKPHOS 115  BILITOT 0.5  PROT 8.5*  ALBUMIN 3.7   CBC:  Recent Labs  01/21/15 1555  WBC 19.0*  NEUTROABS 13.8*  HGB 14.1  HCT 44.0  MCV 88.4  PLT 227   Urinalysis:  Recent Labs  01/21/15 1645  COLORURINE AMBER*  LABSPEC 1.045*  PHURINE 5.0  GLUCOSEU NEGATIVE  HGBUR LARGE*  BILIRUBINUR NEGATIVE  KETONESUR 15*  PROTEINUR >300*  NITRITE NEGATIVE  LEUKOCYTESUR SMALL*   Imaging results:  Dg Chest Port 1 View  01/21/2015  CLINICAL DATA:  Cough and fever.  Dementia.  Unresponsive. EXAM: PORTABLE CHEST 1 VIEW COMPARISON:  10/17/2013 FINDINGS: Patient moderately rotated to the right. Normal heart size. No pleural effusion or pneumothorax. Low lung volumes with resultant pulmonary interstitial prominence. Mild left base atelectasis. Cannot exclude right lower lobe airspace disease medially. IMPRESSION: Suboptimal patient positioning, including moderate obliquity. Possible medial right lower lobe airspace disease. Consider rib repeat radiograph if patient is able to be placed less oblique (as on the 10/17/2013 exam). Electronically Signed   By: Jeronimo Greaves M.D.   On: 01/21/2015 17:05     Other results: EKG: Sinus tachycardia (HR 120) and QT prolongation (QTc 497)  Assessment & Plan by Problem: Principal Problem:   HCAP (healthcare-associated pneumonia) Active Problems:   Dementia   [name]   Dysphasia  HCAP Patient is presenting from a nursing home with cough and congestion for several days. Had a chest x-ray done several days ago that showed pneumonia and was given 1 dose of IM ceftriaxone given today at the nursing home. Patient was admitted 3 months ago for pneumonia. CXR here showing possible medial right lower lobe airspace disease. Patient is febrile (100.8 F) and white count elevated at 19.0. Lactic acid normal. Patient is tachycardic (P 107-127) and tachypnic (RR 29-54). Satting 94-100% on 2L O2 via [place]. She might also possibly have a UTI because UA positive for leukocytes and RBCs.  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 Patient became wheelchair bound in 2013 and has been requiring total care. Resides at [facility] nursing home. She has a feeding tube. -Norco 5-325 mg -Morphine 5 mg q4 prn SOB -Meloxicam 7.5 mg daily  -Ativan 1 mg daily  -Baclofen 10 mg TID -Ropinirole 0.25 mg daily  -Neuro checks   B12 deficiency  -Cyanocobalamin   Constipation -Dulcolax suppository  -Miralax   GI ppx -Nexium 40 mg daily  -Sucralfate   DVT ppx -Lovenox  Diet -Free water per tube q4 hrs  -Multivitamin liquid -Consult to dietician   Code: FULL   Dispo: Disposition is deferred at this time, awaiting improvement of current medical problems. Anticipated discharge in approximately 2-3 day(s).   The patient does have a current PCP [name], MD) and does need an [facility] hospital follow-up appointment after discharge.  The patient does not have transportation limitations  that  hinder transportation to clinic appointments.  Signed: John GiovanniVasundhra Markale Birdsell, MD 01/21/2015, 6:18 PM

## 2015-01-21 NOTE — ED Notes (Signed)
Kathryne ErikssonJanet [name] is pt sister and legal guardian. She will be calling for information. It is ok to give information over the phone. If a password is needed it will be Hilda LiasMarie.

## 2015-01-21 NOTE — Progress Notes (Signed)
Patient seen overnight per request of primary team. Resting comfortably, no acute distress. Tachypneic, RR 35-40. Per RN, and family, appears to be close to baseline. SpO2 97% on RA. Pulmonary exam with diffuse rhonchi. CXR with possible RML/RLL pneumonia, although difficult to interpret given significant rotation. BP 100's/70's on exam. Mild tachycardia. Withdraws/moans to painful stimuli which is apparently patient's baseline. No obvious edema.  -Place patient on 2L O2 via [place] -Continue NS @ 125 cc/hr -Vanc/Zosyn for HCAP/aspiration pneumonia coverage.   Lauris ChromanWoody Donya Hitch, MD PGY-3, Internal Medicine Pager: [phone]

## 2015-01-22 LAB — LACTIC ACID, PLASMA
Lactic Acid, Venous: 1.3 mmol/L (ref 0.5–2.0)
Lactic Acid, Venous: 1.6 mmol/L (ref 0.5–2.0)

## 2015-01-22 LAB — CBC WITH DIFFERENTIAL/PLATELET
BASOS PCT: 0 %
Basophils Absolute: 0 10*3/uL (ref 0.0–0.1)
EOS ABS: 0.3 10*3/uL (ref 0.0–0.7)
EOS PCT: 2 %
HCT: 36.1 % (ref 36.0–46.0)
HEMOGLOBIN: 11.3 g/dL — AB (ref 12.0–15.0)
LYMPHS ABS: 3.6 10*3/uL (ref 0.7–4.0)
Lymphocytes Relative: 22 %
MCH: 28 pg (ref 26.0–34.0)
MCHC: 31.3 g/dL (ref 30.0–36.0)
MCV: 89.4 fL (ref 78.0–100.0)
Monocytes Absolute: 0.6 10*3/uL (ref 0.1–1.0)
Monocytes Relative: 4 %
NEUTROS PCT: 72 %
Neutro Abs: 12.1 10*3/uL — ABNORMAL HIGH (ref 1.7–7.7)
PLATELETS: 178 10*3/uL (ref 150–400)
RBC: 4.04 MIL/uL (ref 3.87–5.11)
RDW: 14.8 % (ref 11.5–15.5)
WBC: 16.7 10*3/uL — AB (ref 4.0–10.5)

## 2015-01-22 LAB — COMPREHENSIVE METABOLIC PANEL
ALK PHOS: 79 U/L (ref 38–126)
ALT: 23 U/L (ref 14–54)
AST: 20 U/L (ref 15–41)
Albumin: 2.5 g/dL — ABNORMAL LOW (ref 3.5–5.0)
Anion gap: 6 (ref 5–15)
BILIRUBIN TOTAL: 1.1 mg/dL (ref 0.3–1.2)
BUN: 20 mg/dL (ref 6–20)
CALCIUM: 7.6 mg/dL — AB (ref 8.9–10.3)
CHLORIDE: 115 mmol/L — AB (ref 101–111)
CO2: 24 mmol/L (ref 22–32)
CREATININE: 0.64 mg/dL (ref 0.44–1.00)
Glucose, Bld: 116 mg/dL — ABNORMAL HIGH (ref 65–99)
Potassium: 3.5 mmol/L (ref 3.5–5.1)
Sodium: 145 mmol/L (ref 135–145)
Total Protein: 5.8 g/dL — ABNORMAL LOW (ref 6.5–8.1)

## 2015-01-22 LAB — MRSA PCR SCREENING: MRSA by PCR: NEGATIVE

## 2015-01-22 MED ORDER — JEVITY 1.2 CAL PO LIQD
1000.0000 mL | ORAL | Status: DC
  Administered 2015-01-22 – 2015-01-28 (×6): 1000 mL
  Filled 2015-01-22: qty 237
  Filled 2015-01-22 (×12): qty 1000

## 2015-01-22 MED ORDER — ALBUTEROL SULFATE (2.5 MG/3ML) 0.083% IN NEBU
2.5000 mg | INHALATION_SOLUTION | Freq: Three times a day (TID) | RESPIRATORY_TRACT | Status: DC
  Administered 2015-01-22 – 2015-01-28 (×18): 2.5 mg via RESPIRATORY_TRACT
  Filled 2015-01-22 (×18): qty 3

## 2015-01-22 MED ORDER — CHLORHEXIDINE GLUCONATE 0.12 % MT SOLN
15.0000 mL | Freq: Two times a day (BID) | OROMUCOSAL | Status: DC
  Administered 2015-01-22 – 2015-01-29 (×16): 15 mL via OROMUCOSAL
  Filled 2015-01-22 (×18): qty 15

## 2015-01-22 MED ORDER — CETYLPYRIDINIUM CHLORIDE 0.05 % MT LIQD
7.0000 mL | Freq: Two times a day (BID) | OROMUCOSAL | Status: DC
  Administered 2015-01-22 – 2015-01-28 (×14): 7 mL via OROMUCOSAL

## 2015-01-22 MED ORDER — VANCOMYCIN HCL IN DEXTROSE 750-5 MG/150ML-% IV SOLN
750.0000 mg | Freq: Two times a day (BID) | INTRAVENOUS | Status: DC
  Administered 2015-01-22 – 2015-01-23 (×3): 750 mg via INTRAVENOUS
  Filled 2015-01-22 (×5): qty 150

## 2015-01-22 NOTE — Progress Notes (Signed)
Brief note:   Talked with HPOA, patient's sister, Ms. Kathryne ErikssonJanet Groene. Her phone number is incorrect in the chart. It should be [phone].  She tells me patient is nonverbal at baseline. Does not really respond in any other way either (such as noding or moving her exts). She basically stares at the person when someone is there at base line. Per sister patient's status is not very different currently from her baseline. The main reason she was brought to the hospital was because sister was visiting the patient at the rehab yesterday and heard about the diagnosis of PNA. She did not want her to be treated at the nursing facility like she was 1 month ago. She asked them to bring her tot he hospital.   Confirmed code status: FULL CODE.  Confirmed that she wants patient to go back to the same facility where she came from [facility]).  Updated her about patient's status.

## 2015-01-22 NOTE — Progress Notes (Signed)
Brief Nutrition Note  Consult received for enteral/tube feeding initiation and management.  Adult Enteral Nutrition Protocol initiated. Full assessment to follow.  Admitting Dx: HCAP (healthcare-associated pneumonia) [J18.9] Severe sepsis (HCC) [A41.9, R65.20]  Body mass index is 23.44 kg/(m^2). Pt meets criteria for normal range based on current BMI.  Labs:   Recent Labs Lab 01/21/15 1555 01/22/15 0247  NA 144 145  K 4.0 3.5  CL 110 115*  CO2 24 24  BUN 28* 20  CREATININE 0.82 0.64  CALCIUM 9.1 7.6*  GLUCOSE 109* 116*    [name] FrancoLindsey Teandra Harlan, MS, RD, LDN Pager: [phone] After Hours Pager: [phone]

## 2015-01-22 NOTE — Care Management Note (Addendum)
Case Management Note  Patient Details  Name: [name] SchmidtChevella [name] MRN: [id number] Date of Birth: 06/20/1970  Subjective/Objective:             Admitted from SNF([facility]) with PNA. Hx of severe dementia(CJD), chronic pain, dysphagia. Pt is npo has a PEG tube for enteral feedings. Pt is non verbal and doesn't follow commands.       Action/Plan: Return to SNF when medically stable. CM to f/u with d/c disposition needs.  Expected Discharge Date:                  Expected Discharge Plan:  Skilled Nursing Facility [facility])  In-House Referral:  Clinical Social Work  Discharge planning Services  CM Consult  Post Acute Care Choice:    Choice offered to:     DME Arranged:    DME Agency:     HH Arranged:    HH Agency:     Status of Service:  In process, will continue to follow  Medicare Important Message Given:    Date Medicare IM Given:    Medicare IM give by:    Date Additional Medicare IM Given:    Additional Medicare Important Message give by:     If discussed at Long Length of Stay Meetings, dates discussed:    Additional Comments:  MD plans to talk with family regarding pt's progression, clarify code status and GOC.    Kathryne ErikssonJanet Zayas (Sister) [phone], Theresia Loatricia Smith (Sister) [phone]  Epifanio Leschesole, Nichole Keltner Hudson, RN 01/22/2015, 10:10 AM

## 2015-01-22 NOTE — Progress Notes (Signed)
CRITICAL VALUE ALERT  Critical value received:gram positive clusters   Date of notification:  11 20  Time of notification:  1320  Critical value read back:Yes.    Nurse who received alert:  Marisue Ivanobyn Tinslee Klare RN   MD notified (1st page):  Internal Medicine  Time of first page:  1350  MD notified (2nd page):  Time of second page:  Responding MD:  Dr Delford FieldWright, Internal Medicine  Time MD responded:  1400

## 2015-01-22 NOTE — Progress Notes (Signed)
Subjective: Patient was seen and examined at bedside. She is non-verbal and does not follow commands, which is her baseline.  Objective: Vital signs in last 24 hours: Filed Vitals:   01/22/15 0551 01/22/15 0700 01/22/15 0815 01/22/15 0840  BP: 100/65 88/58    Pulse: 90 89    Temp:   98.6 F (37 C)   TempSrc:   Axillary   Resp: 17 30    Height:      Weight:      SpO2: 100% 100%  100%   Weight change:   Intake/Output Summary (Last 24 hours) at 01/22/15 0934 Last data filed at 01/22/15 0700  Gross per 24 hour  Intake 3060.42 ml  Output      0 ml  Net 3060.42 ml   Physical Exam: Constitutional:  Decorticate posturing  Cardiovascular: Regular rate and rhythm. Intact distal pulses.  Tachycardic  Pulmonary/Chest:  Diffuse coarse crackles and bronchial breath sounds Abdominal: Soft. Bowel sounds are normal. She exhibits no distension. There is no guarding.  PEG tube site appears clean and dry. Does not appear infected.  Musculoskeletal: She exhibits no edema.  Neurological:  Nonverbal Does not follow commands  Skin: Skin is warm and dry.   Lab Results: Basic Metabolic Panel:  Recent Labs Lab 01/21/15 1555 01/22/15 0247  NA 144 145  K 4.0 3.5  CL 110 115*  CO2 24 24  GLUCOSE 109* 116*  BUN 28* 20  CREATININE 0.82 0.64  CALCIUM 9.1 7.6*   Liver Function Tests:  Recent Labs Lab 01/21/15 1555 01/22/15 0247  AST 24 20  ALT 32 23  ALKPHOS 115 79  BILITOT 0.5 1.1  PROT 8.5* 5.8*  ALBUMIN 3.7 2.5*   CBC:  Recent Labs Lab 01/21/15 1555 01/22/15 0247  WBC 19.0* 16.7*  NEUTROABS 13.8* 12.1*  HGB 14.1 11.3*  HCT 44.0 36.1  MCV 88.4 89.4  PLT 227 178   Urinalysis:  Recent Labs Lab 01/21/15 1645  COLORURINE AMBER*  LABSPEC 1.045*  PHURINE 5.0  GLUCOSEU NEGATIVE  HGBUR LARGE*  BILIRUBINUR NEGATIVE  KETONESUR 15*  PROTEINUR >300*  NITRITE NEGATIVE  LEUKOCYTESUR SMALL*   Misc. Labs:   Micro Results: Recent Results (from the past 240  hour(s))  MRSA PCR Screening     Status: None   Collection Time: 01/22/15 12:09 AM  Result Value Ref Range Status   MRSA by PCR NEGATIVE NEGATIVE Final    Comment:        The GeneXpert MRSA Assay (FDA approved for NASAL specimens only), is one component of a comprehensive MRSA colonization surveillance program. It is not intended to diagnose MRSA infection nor to guide or monitor treatment for MRSA infections.    Studies/Results: Dg Chest Port 1 View  01/21/2015  CLINICAL DATA:  Cough and fever.  Dementia.  Unresponsive. EXAM: PORTABLE CHEST 1 VIEW COMPARISON:  10/17/2013 FINDINGS: Patient moderately rotated to the right. Normal heart size. No pleural effusion or pneumothorax. Low lung volumes with resultant pulmonary interstitial prominence. Mild left base atelectasis. Cannot exclude right lower lobe airspace disease medially. IMPRESSION: Suboptimal patient positioning, including moderate obliquity. Possible medial right lower lobe airspace disease. Consider rib repeat radiograph if patient is able to be placed less oblique (as on the 10/17/2013 exam). Electronically Signed   By: Jeronimo GreavesKyle  Talbot M.D.   On: 01/21/2015 17:05   Medications: I have reviewed the patient's current medications. Scheduled Meds: . albuterol  2.5 mg Nebulization QID  . antiseptic oral rinse  7  mL Mouth Rinse q12n4p  . baclofen  10 mg Per Tube TID  . chlorhexidine  15 mL Mouth Rinse BID  . enoxaparin (LOVENOX) injection  40 mg Subcutaneous Q24H  . free water  175 mL Per Tube Q4H  . HYDROcodone-acetaminophen  1 tablet Oral 2 times per day  . loratadine  10 mg Per Tube Daily  . meloxicam  7.5 mg Per Tube Q breakfast  . multivitamin  5 mL Per Tube Daily  . pantoprazole sodium  40 mg Per Tube Daily  . piperacillin-tazobactam (ZOSYN)  IV  3.375 g Intravenous 3 times per day  . polyethylene glycol  17 g Per Tube Daily  . rOPINIRole  0.25 mg Per Tube q1800  . sucralfate  1 g Per Tube 4 times per day  . vancomycin   750 mg Intravenous Q8H  . vitamin B-12  1,000 mcg Per Tube Daily   Continuous Infusions: . sodium chloride 125 mL/hr at 01/22/15 0700  . feeding supplement (JEVITY 1.2 CAL)     PRN Meds:.bisacodyl, morphine CONCENTRATE Assessment/Plan: Principal Problem:   HCAP (healthcare-associated pneumonia) Active Problems:   Dementia   Creutzfeldt Harlene Salts disease   Dysphasia  HCAP Patient is presenting from a nursing home with cough and congestion for several days. Had a chest x-ray done several days ago that showed pneumonia and was given 1 dose of IM ceftriaxone at the nursing home. She was also admitted 3 months ago for pneumonia. CXR here showing possible medial right lower lobe airspace disease. EKG showing sinus tachycardia (HR 120) and QTc 498. Pneumonia likely due to aspiration of oral secretions or gastric secretions (patient has a G tube in place for enteral nutrition). Patient is afebrile now and white count trending down (16.7 today). Lactic acid normal. HR normal now but patient is still tachypnic (RR in 30s). Satting 94-100% on 2L O2 via [place]. She might also possibly have a UTI because UA positive for leukocytes and RBCs. We tried calling healthcare power of attorney (sister) again today but could not reach her over the phone.  -cont broad spectrum antibiotics Vanc and Zosyn for another day. Then transition to oral antibiotics.  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 Resides at [facility] nursing home. She has a feeding tube. -Norco 5-325 mg -Morphine 5 mg q4 prn SOB -Meloxicam 7.5 mg daily  -Ativan 1 mg daily  -Baclofen 10 mg TID -Ropinirole 0.25 mg daily  -Neuro checks   B12 deficiency  -Cyanocobalamin   Constipation -Dulcolax suppository  -Miralax   GI ppx -Nexium 40 mg daily  -Sucralfate   DVT  ppx -Lovenox  Diet Patient has a G tube in place for enteral nutrition.  -Free water per tube q4 hrs  -Multivitamin liquid -Consult to dietician   Code: FULL   Dispo: Disposition is deferred at this time, awaiting improvement of current medical problems.  Anticipated discharge in approximately 2-3 day(s).   The patient does have a current PCP [name], MD) and does need an [facility] hospital follow-up appointment after discharge.  The patient does not have transportation limitations that hinder transportation to clinic appointments.  .Services Needed at time of discharge: Y = Yes, Blank = No PT:   OT:   RN:   Equipment:   Other:     LOS: 1 day   John Giovanni,  MD 01/22/2015, 9:34 AM

## 2015-01-23 ENCOUNTER — Inpatient Hospital Stay (HOSPITAL_COMMUNITY): Payer: Medicare Other

## 2015-01-23 LAB — GLUCOSE, CAPILLARY
GLUCOSE-CAPILLARY: 121 mg/dL — AB (ref 65–99)
GLUCOSE-CAPILLARY: 111 mg/dL — AB (ref 65–99)
GLUCOSE-CAPILLARY: 108 mg/dL — AB (ref 65–99)
Glucose-Capillary: 98 mg/dL (ref 65–99)

## 2015-01-23 LAB — CBC WITH DIFFERENTIAL/PLATELET
BASOS ABS: 0 10*3/uL (ref 0.0–0.1)
Basophils Relative: 0 %
EOS ABS: 0.5 10*3/uL (ref 0.0–0.7)
EOS PCT: 4 %
HCT: 34.7 % — ABNORMAL LOW (ref 36.0–46.0)
Hemoglobin: 11.1 g/dL — ABNORMAL LOW (ref 12.0–15.0)
LYMPHS PCT: 29 %
Lymphs Abs: 3.2 10*3/uL (ref 0.7–4.0)
MCH: 28.1 pg (ref 26.0–34.0)
MCHC: 32 g/dL (ref 30.0–36.0)
MCV: 87.8 fL (ref 78.0–100.0)
Monocytes Absolute: 0.8 10*3/uL (ref 0.1–1.0)
Monocytes Relative: 8 %
Neutro Abs: 6.5 10*3/uL (ref 1.7–7.7)
Neutrophils Relative %: 59 %
PLATELETS: 208 10*3/uL (ref 150–400)
RBC: 3.95 MIL/uL (ref 3.87–5.11)
RDW: 14.6 % (ref 11.5–15.5)
WBC: 11 10*3/uL — AB (ref 4.0–10.5)

## 2015-01-23 LAB — BASIC METABOLIC PANEL
Anion gap: 6 (ref 5–15)
BUN: 8 mg/dL (ref 6–20)
CHLORIDE: 112 mmol/L — AB (ref 101–111)
CO2: 25 mmol/L (ref 22–32)
Calcium: 8.1 mg/dL — ABNORMAL LOW (ref 8.9–10.3)
Creatinine, Ser: 0.65 mg/dL (ref 0.44–1.00)
GFR calc Af Amer: >60 mL/min (ref >60)
GLUCOSE: 105 mg/dL — AB (ref 65–99)
POTASSIUM: 3.1 mmol/L — AB (ref 3.5–5.1)
SODIUM: 143 mmol/L (ref 135–145)

## 2015-01-23 MED ORDER — FREE WATER
100.0000 mL | Status: DC
  Administered 2015-01-23 – 2015-01-28 (×33): 100 mL

## 2015-01-23 MED ORDER — SODIUM CHLORIDE 0.9 % IV SOLN
INTRAVENOUS | Status: AC
  Administered 2015-01-23: 75 mL/h via INTRAVENOUS

## 2015-01-23 MED ORDER — PRO-STAT SUGAR FREE PO LIQD
30.0000 mL | Freq: Every day | ORAL | Status: DC
  Administered 2015-01-23 – 2015-01-29 (×7): 30 mL
  Filled 2015-01-23 (×7): qty 30

## 2015-01-23 MED ORDER — POTASSIUM CHLORIDE 10 MEQ/100ML IV SOLN
10.0000 meq | INTRAVENOUS | Status: AC
  Administered 2015-01-23 (×4): 10 meq via INTRAVENOUS
  Filled 2015-01-23: qty 100

## 2015-01-23 NOTE — Progress Notes (Signed)
Patient ID: [name] SchmidtChevella [name], female   DOB: 08/11/1970, 44 y.o.   MRN: [id number] Medicine attending: I examined this patient together with resident physician Dr. John GiovanniVasundhra Rathore and reviewed pertinent clinical, laboratory, and x-ray data. Unfortunate 44 year old woman with a progressive, dementing, illness, currently residing in a chronic nursing facility. She is non-communicative. 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 She has a feeding gastrostomy. She had a change in her overall status noticed by her family and was transported to the hospital on November 19 for further evaluation. She was febrile to 100.8, tachycardic to 127. Borderline blood pressure as low as 98/75. Lactic acid normal at 1.74. A poor quality rotated portable chest x-ray suggested a right middle lobe infiltrate not confirmed on follow-up study done this morning. However, she is at high risk for aspiration. Cultures were obtained and she started on antibiotics to cover a facility acquired pneumonia. Urine showed 6-30 white cells but negative nitrite. Blood cultures on admission now being reported as positive for gram-positive organisms in clusters. We will continue broad-spectrum coverage pending identification of the organism but we will likely be able to  decrease her antibiotics to single agent vancomycin. Per family request, she is a full code.

## 2015-01-23 NOTE — Progress Notes (Signed)
Dr. Cyndie ChimeGranfortuna notified that patient has a positive blood culture from a pediatric tube drawn on 11/20.  No new orders received.

## 2015-01-23 NOTE — Clinical Documentation Improvement (Signed)
Internal Medicine  Abnormal Lab/Test Results:   Component      Potassium  Latest Ref Rng      3.5 - 5.1 mmol/L  01/21/2015     3:55 PM 4.0  01/22/2015      3.5  01/23/2015      3.1 (L)   Possible Clinical Conditions associated with below indicators  Hypokalemia  Other Condition  Cannot Clinically Determine  Treatment Provided: Potassium chloride 10 mEq IV q 1 hr x 4 ordered.     Please exercise your independent, professional judgment when responding. A specific answer is not anticipated or expected.  Thank you, Doy MinceVangela Dishawn Bhargava, RN [phone] Clinical Documentation Specialist

## 2015-01-23 NOTE — Progress Notes (Signed)
Initial Nutrition Assessment  DOCUMENTATION CODES:   Not applicable  INTERVENTION:    Continue TF via PEG with Jevity 1.2, increasing to goal rate of 50 ml/h, add Prostat 30 ml once daily to provide 1540 kcals, 82 gm protein, 972 ml free water daily.  With above TF regimen, will decrease free water flushes to 100 ml every 4 hours.   NUTRITION DIAGNOSIS:   Inadequate oral intake related to inability to eat as evidenced by NPO status.  GOAL:   Patient will meet greater than or equal to 90% of their needs  MONITOR:   TF tolerance, Weight trends, Labs, I & O's  REASON FOR ASSESSMENT:   Consult Enteral/tube feeding initiation and management  ASSESSMENT:   44 y.o. female with a past medical hx of dementia, Lenise ArenaCreutzfeldt Jakob disease, presented with pneumonia. Patient resides in the nursing home and is nonverbal at baseline. Patient has a PEG tube. Has been coughing and congested for several days. Had a chest x-ray done several days ago that showed pneumonia.  Received MD Consult for TF initiation and management. PEG in place. PTA, received TwoCal HN at 30 ml/h with 175 ml free water flushes every 4 hours. Currently receiving Jevity 1.2 at 50 ml/h to provide 1440 kcals, 67 gm protein, 972 ml free water daily.  Nutrition-Focused physical exam completed. Findings are no fat depletion, mild muscle depletion, and no edema. Labs reviewed: potassium is low.  Diet Order:  Diet NPO time specified  Skin:  Reviewed, no issues  Last BM:  11/19  Height:   Ht Readings from Last 1 Encounters:  01/21/15 5\' 3"  (1.6 m)    Weight:   Wt Readings from Last 1 Encounters:  01/23/15 137 lb 9.1 oz (62.4 kg)    Ideal Body Weight:  52.3 kg  BMI:  Body mass index is 24.38 kg/(m^2).  Estimated Nutritional Needs:   Kcal:  1400-1600  Protein:  70-85 gm  Fluid:  1.4-1.6 L  EDUCATION NEEDS:   No education needs identified at this time  Joaquin CourtsKimberly Harris, RD, LDN, CNSC Pager  [phone] After Hours Pager [phone]

## 2015-01-23 NOTE — Progress Notes (Addendum)
Subjective: Patient was seen and examined at bedside. She is non-verbal and does not follow commands, which is her baseline.  Objective: Vital signs in last 24 hours: Filed Vitals:   01/23/15 0847 01/23/15 1127 01/23/15 1203 01/23/15 1424  BP:  112/81  106/82  Pulse:  101  94  Temp: 98.6 F (37 C)  98.2 F (36.8 C) 98 F (36.7 C)  TempSrc: Oral  Axillary Axillary  Resp:  39  45  Height:      Weight:      SpO2:  98%  99%   Weight change: -12 lb 8.1 oz (-5.672 kg)  Intake/Output Summary (Last 24 hours) at 01/23/15 1532 Last data filed at 01/23/15 0955  Gross per 24 hour  Intake 2441.83 ml  Output      0 ml  Net 2441.83 ml   Physical Exam: Constitutional:  Decorticate posturing  Cardiovascular: Regular rate and rhythm. Intact distal pulses.  Tachycardic  Pulmonary/Chest: CTAB. No rales or rhonchi appreciated.  Abdominal: Soft. Bowel sounds are normal. She exhibits no distension. There is no guarding.  PEG tube site appears clean and dry. Does not appear infected.  Musculoskeletal: She exhibits no edema.  Neurological:  Nonverbal Does not follow commands  Skin: Skin is warm and dry.   Lab Results: Basic Metabolic Panel:  Recent Labs Lab 01/22/15 0247 01/23/15 0335  NA 145 143  K 3.5 3.1*  CL 115* 112*  CO2 24 25  GLUCOSE 116* 105*  BUN 20 8  CREATININE 0.64 0.65  CALCIUM 7.6* 8.1*   Liver Function Tests:  Recent Labs Lab 01/21/15 1555 01/22/15 0247  AST 24 20  ALT 32 23  ALKPHOS 115 79  BILITOT 0.5 1.1  PROT 8.5* 5.8*  ALBUMIN 3.7 2.5*   CBC:  Recent Labs Lab 01/22/15 0247 01/23/15 0335  WBC 16.7* 11.0*  NEUTROABS 12.1* 6.5  HGB 11.3* 11.1*  HCT 36.1 34.7*  MCV 89.4 87.8  PLT 178 208   Urinalysis:  Recent Labs Lab 01/21/15 1645  COLORURINE AMBER*  LABSPEC 1.045*  PHURINE 5.0  GLUCOSEU NEGATIVE  HGBUR LARGE*  BILIRUBINUR NEGATIVE  KETONESUR 15*  PROTEINUR >300*  NITRITE NEGATIVE  LEUKOCYTESUR SMALL*   Misc.  Labs:   Micro Results: Recent Results (from the past 240 hour(s))  Culture, blood (routine x 2)     Status: None (Preliminary result)   Collection Time: 01/21/15  3:55 PM  Result Value Ref Range Status   Specimen Description BLOOD LEFT FOREARM  Final   Special Requests IN PEDIATRIC BOTTLE 1CC  Final   Culture NO GROWTH 2 DAYS  Final   Report Status PENDING  Incomplete  Culture, blood (routine x 2)     Status: None (Preliminary result)   Collection Time: 01/21/15  4:00 PM  Result Value Ref Range Status   Specimen Description BLOOD RIGHT FOREARM  Final   Special Requests BOTTLES DRAWN AEROBIC AND ANAEROBIC 5CC  Final   Culture  Setup Time   Final    GRAM POSITIVE COCCI IN CLUSTERS IN BOTH AEROBIC AND ANAEROBIC BOTTLES CRITICAL RESULT CALLED TO, READ BACK BY AND VERIFIED WITH: R. MYRICK,RN AT 1315 ON [id number] BY Lucienne Capers    Culture GRAM POSITIVE COCCI  Final   Report Status PENDING  Incomplete  Urine culture     Status: None (Preliminary result)   Collection Time: 01/21/15  4:45 PM  Result Value Ref Range Status   Specimen Description URINE, CATHETERIZED  Final   Special  Requests NONE  Final   Culture >=100,000 COLONIES/mL ENTEROCOCCUS SPECIES  Final   Report Status PENDING  Incomplete  MRSA PCR Screening     Status: None   Collection Time: 01/22/15 12:09 AM  Result Value Ref Range Status   MRSA by PCR NEGATIVE NEGATIVE Final    Comment:        The GeneXpert MRSA Assay (FDA approved for NASAL specimens only), is one component of a comprehensive MRSA colonization surveillance program. It is not intended to diagnose MRSA infection nor to guide or monitor treatment for MRSA infections.   Culture, blood (routine x 2)     Status: None (Preliminary result)   Collection Time: 01/22/15  6:57 PM  Result Value Ref Range Status   Specimen Description BLOOD RIGHT HAND  Final   Special Requests IN PEDIATRIC BOTTLE 3CC  Final   Culture  Setup Time   Final    GRAM POSITIVE COCCI  IN CLUSTERS AEROBIC BOTTLE ONLY CRITICAL RESULT CALLED TO, READ BACK BY AND VERIFIED WITH: S DOTY 01/23/15 @ 1324 M VESTAL    Culture GRAM POSITIVE COCCI  Final   Report Status PENDING  Incomplete  Culture, blood (routine x 2)     Status: None (Preliminary result)   Collection Time: 01/22/15  7:08 PM  Result Value Ref Range Status   Specimen Description BLOOD RIGHT HAND  Final   Special Requests BOTTLES DRAWN AEROBIC ONLY 5CC  Final   Culture NO GROWTH < 24 HOURS  Final   Report Status PENDING  Incomplete   Studies/Results: Dg Chest Port 1 View  01/23/2015  CLINICAL DATA:  Pneumonia EXAM: PORTABLE CHEST 1 VIEW COMPARISON:  01/21/2015 FINDINGS: Cardiomediastinal silhouette is stable. Central mild vascular congestion without convincing pulmonary edema. Mild elevation of the left hemidiaphragm again noted. Left basilar atelectasis or infiltrate. IMPRESSION: No convincing pulmonary edema. Left basilar atelectasis or infiltrate. Electronically Signed   By: Natasha MeadLiviu  Pop M.D.   On: 01/23/2015 07:57   Dg Chest Port 1 View  01/21/2015  CLINICAL DATA:  Cough and fever.  Dementia.  Unresponsive. EXAM: PORTABLE CHEST 1 VIEW COMPARISON:  10/17/2013 FINDINGS: Patient moderately rotated to the right. Normal heart size. No pleural effusion or pneumothorax. Low lung volumes with resultant pulmonary interstitial prominence. Mild left base atelectasis. Cannot exclude right lower lobe airspace disease medially. IMPRESSION: Suboptimal patient positioning, including moderate obliquity. Possible medial right lower lobe airspace disease. Consider rib repeat radiograph if patient is able to be placed less oblique (as on the 10/17/2013 exam). Electronically Signed   By: Jeronimo GreavesKyle  Talbot M.D.   On: 01/21/2015 17:05   Medications: I have reviewed the patient's current medications. Scheduled Meds: . albuterol  2.5 mg Nebulization TID  . antiseptic oral rinse  7 mL Mouth Rinse q12n4p  . baclofen  10 mg Per Tube TID  .  chlorhexidine  15 mL Mouth Rinse BID  . enoxaparin (LOVENOX) injection  40 mg Subcutaneous Q24H  . feeding supplement (PRO-STAT SUGAR FREE 64)  30 mL Per Tube Daily  . free water  100 mL Per Tube Q4H  . HYDROcodone-acetaminophen  1 tablet Oral 2 times per day  . loratadine  10 mg Per Tube Daily  . meloxicam  7.5 mg Per Tube Q breakfast  . multivitamin  5 mL Per Tube Daily  . pantoprazole sodium  40 mg Per Tube Daily  . piperacillin-tazobactam (ZOSYN)  IV  3.375 g Intravenous 3 times per day  . polyethylene glycol  17  g Per Tube Daily  . rOPINIRole  0.25 mg Per Tube q1800  . sucralfate  1 g Per Tube 4 times per day  . vancomycin  750 mg Intravenous Q12H  . vitamin B-12  1,000 mcg Per Tube Daily   Continuous Infusions: . feeding supplement (JEVITY 1.2 CAL) 1,000 mL (01/23/15 0249)   PRN Meds:.bisacodyl, morphine CONCENTRATE Assessment/Plan: Principal Problem:   HCAP (healthcare-associated pneumonia) Active Problems:   Dementia   [name]   Dysphasia  Bacteremia  Patient is presenting from a nursing home with cough and congestion for several days. Had a chest x-ray done several days ago that showed pneumonia and was given 1 dose of IM ceftriaxone at the nursing home. She was also admitted 3 months ago for pneumonia. CXR here on admission showing possible medial right lower lobe airspace disease. EKG showing sinus tachycardia (HR 120) and QTc 498. Repeat CXR today showing L basilar atelectasis/ infiltrate but no convincing pulmonary edema. She is currently afebrile now and white count trending down to 11.0. HR normal now but patient is still tachypnic (RR in 23-44). Satting 99-100% on 2L O2 via [place]. She might also possibly have a UTI because UA positive for leukocytes and RBCs. Urine culture growing E.Coli. Blood culture positive for gram (+) cocci in clusters.  -cont broad spectrum antibiotics Vanc and Zosyn for now. Waiting for speciation results.  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 Resides at [facility] nursing home. She has a feeding tube. -Norco 5-325 mg -Morphine 5 mg q4 prn SOB -Meloxicam 7.5 mg daily  -Ativan 1 mg daily  -Baclofen 10 mg TID -Ropinirole 0.25 mg daily  -Neuro checks   Hypokalemia - K 3.1 today. Replete as needed.  B12 deficiency  -Cyanocobalamin   Constipation -Dulcolax suppository  -Miralax   GI ppx -Nexium 40 mg daily  -Sucralfate   DVT ppx -Lovenox  Diet Patient has a G tube in place for enteral nutrition.  -Free water per tube q4 hrs  -Multivitamin liquid -Consult to dietician   Code: FULL   Dispo: Disposition is deferred at this time, awaiting improvement of current medical problems.  Anticipated discharge in approximately 2-3 day(s).   The patient does have a current PCP [name], MD) and does need an [facility] hospital follow-up appointment after discharge.  The patient does not have transportation limitations that hinder transportation to clinic appointments.  .Services Needed at time of discharge: Y = Yes, Blank = No PT:   OT:   RN:   Equipment:   Other:     LOS: 2 days   John Giovanni, MD 01/23/2015, 3:32 PM

## 2015-01-24 ENCOUNTER — Encounter (HOSPITAL_COMMUNITY): Payer: Self-pay | Admitting: Oncology

## 2015-01-24 DIAGNOSIS — Z993 Dependence on wheelchair: Secondary | ICD-10-CM

## 2015-01-24 DIAGNOSIS — B952 Enterococcus as the cause of diseases classified elsewhere: Secondary | ICD-10-CM | POA: Diagnosis present

## 2015-01-24 DIAGNOSIS — E538 Deficiency of other specified B group vitamins: Secondary | ICD-10-CM

## 2015-01-24 DIAGNOSIS — F0280 Dementia in other diseases classified elsewhere without behavioral disturbance: Secondary | ICD-10-CM

## 2015-01-24 DIAGNOSIS — K5900 Constipation, unspecified: Secondary | ICD-10-CM

## 2015-01-24 DIAGNOSIS — B957 Other staphylococcus as the cause of diseases classified elsewhere: Secondary | ICD-10-CM

## 2015-01-24 DIAGNOSIS — E876 Hypokalemia: Secondary | ICD-10-CM

## 2015-01-24 DIAGNOSIS — G3109 Other frontotemporal dementia: Secondary | ICD-10-CM

## 2015-01-24 DIAGNOSIS — A411 Sepsis due to other specified staphylococcus: Secondary | ICD-10-CM

## 2015-01-24 DIAGNOSIS — N390 Urinary tract infection, site not specified: Secondary | ICD-10-CM

## 2015-01-24 HISTORY — DX: Sepsis due to other specified Staphylococcus: A41.1

## 2015-01-24 HISTORY — DX: Urinary tract infection, site not specified: B95.2

## 2015-01-24 HISTORY — DX: Urinary tract infection, site not specified: N39.0

## 2015-01-24 LAB — GLUCOSE, CAPILLARY
GLUCOSE-CAPILLARY: 110 mg/dL — AB (ref 65–99)
Glucose-Capillary: 97 mg/dL (ref 65–99)

## 2015-01-24 LAB — RENAL FUNCTION PANEL
ALBUMIN: 2.7 g/dL — AB (ref 3.5–5.0)
ANION GAP: 8 (ref 5–15)
BUN: 8 mg/dL (ref 6–20)
CO2: 25 mmol/L (ref 22–32)
Calcium: 8.3 mg/dL — ABNORMAL LOW (ref 8.9–10.3)
Chloride: 111 mmol/L (ref 101–111)
Creatinine, Ser: 0.66 mg/dL (ref 0.44–1.00)
GFR calc Af Amer: >60 mL/min (ref >60)
GFR calc non Af Amer: >60 mL/min (ref >60)
GLUCOSE: 111 mg/dL — AB (ref 65–99)
POTASSIUM: 3.3 mmol/L — AB (ref 3.5–5.1)
Phosphorus: 4.2 mg/dL (ref 2.5–4.6)
SODIUM: 144 mmol/L (ref 135–145)

## 2015-01-24 LAB — CBC
HEMATOCRIT: 34.3 % — AB (ref 36.0–46.0)
Hemoglobin: 10.9 g/dL — ABNORMAL LOW (ref 12.0–15.0)
MCH: 27.9 pg (ref 26.0–34.0)
MCHC: 31.8 g/dL (ref 30.0–36.0)
MCV: 87.9 fL (ref 78.0–100.0)
PLATELETS: 228 10*3/uL (ref 150–400)
RBC: 3.9 MIL/uL (ref 3.87–5.11)
RDW: 14.9 % (ref 11.5–15.5)
WBC: 7.4 10*3/uL (ref 4.0–10.5)

## 2015-01-24 LAB — MAGNESIUM: Magnesium: 2 mg/dL (ref 1.7–2.4)

## 2015-01-24 LAB — VANCOMYCIN, TROUGH: Vancomycin Tr: 13 ug/mL (ref 10.0–20.0)

## 2015-01-24 MED ORDER — VANCOMYCIN HCL IN DEXTROSE 1-5 GM/200ML-% IV SOLN: 1000.0000 mg | Freq: Two times a day (BID) | INTRAVENOUS | Status: DC

## 2015-01-24 MED ORDER — POTASSIUM CHLORIDE 10 MEQ/100ML IV SOLN
10.0000 meq | INTRAVENOUS | Status: AC
  Administered 2015-01-24 (×4): 10 meq via INTRAVENOUS
  Filled 2015-01-24 (×4): qty 100

## 2015-01-24 MED ORDER — VANCOMYCIN HCL IN DEXTROSE 1-5 GM/200ML-% IV SOLN
1000.0000 mg | Freq: Two times a day (BID) | INTRAVENOUS | Status: DC
  Filled 2015-01-24 (×2): qty 200

## 2015-01-24 MED ORDER — VANCOMYCIN HCL IN DEXTROSE 1-5 GM/200ML-% IV SOLN
1000.0000 mg | Freq: Two times a day (BID) | INTRAVENOUS | Status: DC
  Administered 2015-01-24 – 2015-01-29 (×10): 1000 mg via INTRAVENOUS
  Filled 2015-01-24 (×12): qty 200

## 2015-01-24 NOTE — Care Management Important Message (Signed)
Important Message  Patient Details  Name: [name] SchmidtChevella [name] MRN: [id number] Date of Birth: 05/08/1970   Medicare Important Message Given:  Yes    [name], [name] 01/24/2015, 2:03 PM

## 2015-01-24 NOTE — Progress Notes (Signed)
Subjective: Patient was seen and examined at bedside. She is non-verbal and does not follow commands, which is her baseline.  Objective: Vital signs in last 24 hours: Filed Vitals:   01/24/15 0930 01/24/15 1210 01/24/15 1211 01/24/15 1317  BP: 121/87 127/91  142/102  Pulse: 98 100  93  Temp:   98.4 F (36.9 C) 98.9 F (37.2 C)  TempSrc:   Axillary Axillary  Resp: 28 29  25   Height:      Weight:      SpO2: 99% 99%  100%   Weight change: 5 lb 11.7 oz (2.6 kg)  Intake/Output Summary (Last 24 hours) at 01/24/15 1756 Last data filed at 01/24/15 1124  Gross per 24 hour  Intake 3061.25 ml  Output      0 ml  Net 3061.25 ml   Physical Exam: Constitutional:  Decorticate posturing  Drooling of secretions from mouth  Cardiovascular: Regular rate and rhythm. Intact distal pulses.  Pulmonary/Chest: CTAB. No rales or rhonchi appreciated.  Abdominal: Soft. Bowel sounds are normal. She exhibits no distension. There is no guarding.  PEG tube site appears clean and dry. Does not appear infected.  Musculoskeletal: She exhibits no edema.  Neurological:  Nonverbal Does not follow commands  Skin: Skin is warm and dry.   Lab Results: Basic Metabolic Panel:  Recent Labs Lab 01/23/15 0335 01/24/15 0412  NA 143 144  K 3.1* 3.3*  CL 112* 111  CO2 25 25  GLUCOSE 105* 111*  BUN 8 8  CREATININE 0.65 0.66  CALCIUM 8.1* 8.3*  MG  --  2.0  PHOS  --  4.2   Liver Function Tests:  Recent Labs Lab 01/21/15 1555 01/22/15 0247 01/24/15 0412  AST 24 20  --   ALT 32 23  --   ALKPHOS 115 79  --   BILITOT 0.5 1.1  --   PROT 8.5* 5.8*  --   ALBUMIN 3.7 2.5* 2.7*   CBC:  Recent Labs Lab 01/22/15 0247 01/23/15 0335 01/24/15 0612  WBC 16.7* 11.0* 7.4  NEUTROABS 12.1* 6.5  --   HGB 11.3* 11.1* 10.9*  HCT 36.1 34.7* 34.3*  MCV 89.4 87.8 87.9  PLT 178 208 228   Urinalysis:  Recent Labs Lab 01/21/15 1645  COLORURINE AMBER*  LABSPEC 1.045*  PHURINE 5.0  GLUCOSEU  NEGATIVE  HGBUR LARGE*  BILIRUBINUR NEGATIVE  KETONESUR 15*  PROTEINUR >300*  NITRITE NEGATIVE  LEUKOCYTESUR SMALL*   Misc. Labs:   Micro Results: Recent Results (from the past 240 hour(s))  Culture, blood (routine x 2)     Status: None (Preliminary result)   Collection Time: 01/21/15  3:55 PM  Result Value Ref Range Status   Specimen Description BLOOD LEFT FOREARM  Final   Special Requests IN PEDIATRIC BOTTLE 1CC  Final   Culture NO GROWTH 3 DAYS  Final   Report Status PENDING  Incomplete  Culture, blood (routine x 2)     Status: None (Preliminary result)   Collection Time: 01/21/15  4:00 PM  Result Value Ref Range Status   Specimen Description BLOOD RIGHT FOREARM  Final   Special Requests BOTTLES DRAWN AEROBIC AND ANAEROBIC 5CC  Final   Culture  Setup Time   Final    GRAM POSITIVE COCCI IN CLUSTERS IN BOTH AEROBIC AND ANAEROBIC BOTTLES CRITICAL RESULT CALLED TO, READ BACK BY AND VERIFIED WITH: R. MYRICK,RN AT 1315 ON 112016 BY Lucienne CapersS. YARBROUGH    Culture STAPHYLOCOCCUS HOMINIS  Final   Report  Status PENDING  Incomplete   Organism ID, Bacteria STAPHYLOCOCCUS HOMINIS  Final      Susceptibility   Staphylococcus hominis - MIC*    CIPROFLOXACIN >=8 RESISTANT Resistant     ERYTHROMYCIN <=0.25 SENSITIVE Sensitive     GENTAMICIN <=0.5 SENSITIVE Sensitive     OXACILLIN >=4 RESISTANT Resistant     TETRACYCLINE >=16 RESISTANT Resistant     VANCOMYCIN 1 SENSITIVE Sensitive     TRIMETH/SULFA 80 RESISTANT Resistant     CLINDAMYCIN <=0.25 SENSITIVE Sensitive     RIFAMPIN <=0.5 SENSITIVE Sensitive     Inducible Clindamycin NEGATIVE Sensitive     * STAPHYLOCOCCUS HOMINIS  Urine culture     Status: None (Preliminary result)   Collection Time: 01/21/15  4:45 PM  Result Value Ref Range Status   Specimen Description URINE, CATHETERIZED  Final   Special Requests NONE  Final   Culture   Final    >=100,000 COLONIES/mL ENTEROCOCCUS SPECIES SUSCEPTIBILITIES TO FOLLOW    Report Status  PENDING  Incomplete  MRSA PCR Screening     Status: None   Collection Time: 01/22/15 12:09 AM  Result Value Ref Range Status   MRSA by PCR NEGATIVE NEGATIVE Final    Comment:        The GeneXpert MRSA Assay (FDA approved for NASAL specimens only), is one component of a comprehensive MRSA colonization surveillance program. It is not intended to diagnose MRSA infection nor to guide or monitor treatment for MRSA infections.   Culture, blood (routine x 2)     Status: None (Preliminary result)   Collection Time: 01/22/15  6:57 PM  Result Value Ref Range Status   Specimen Description BLOOD RIGHT HAND  Final   Special Requests IN PEDIATRIC BOTTLE 3CC  Final   Culture  Setup Time   Final    GRAM POSITIVE COCCI IN CLUSTERS AEROBIC BOTTLE ONLY CRITICAL RESULT CALLED TO, READ BACK BY AND VERIFIED WITH: S DOTY 01/23/15 @ 1324 M VESTAL    Culture GRAM POSITIVE COCCI  Final   Report Status PENDING  Incomplete  Culture, blood (routine x 2)     Status: None (Preliminary result)   Collection Time: 01/22/15  7:08 PM  Result Value Ref Range Status   Specimen Description BLOOD RIGHT HAND  Final   Special Requests BOTTLES DRAWN AEROBIC ONLY 5CC  Final   Culture NO GROWTH 2 DAYS  Final   Report Status PENDING  Incomplete   Studies/Results: Dg Chest Port 1 View  01/23/2015  CLINICAL DATA:  Pneumonia EXAM: PORTABLE CHEST 1 VIEW COMPARISON:  01/21/2015 FINDINGS: Cardiomediastinal silhouette is stable. Central mild vascular congestion without convincing pulmonary edema. Mild elevation of the left hemidiaphragm again noted. Left basilar atelectasis or infiltrate. IMPRESSION: No convincing pulmonary edema. Left basilar atelectasis or infiltrate. Electronically Signed   By: Natasha Mead M.D.   On: 01/23/2015 07:57   Medications: I have reviewed the patient's current medications. Scheduled Meds: . albuterol  2.5 mg Nebulization TID  . antiseptic oral rinse  7 mL Mouth Rinse q12n4p  . baclofen  10 mg Per  Tube TID  . chlorhexidine  15 mL Mouth Rinse BID  . enoxaparin (LOVENOX) injection  40 mg Subcutaneous Q24H  . feeding supplement (PRO-STAT SUGAR FREE 64)  30 mL Per Tube Daily  . free water  100 mL Per Tube Q4H  . HYDROcodone-acetaminophen  1 tablet Oral 2 times per day  . loratadine  10 mg Per Tube Daily  . meloxicam  7.5 mg Per Tube Q breakfast  . multivitamin  5 mL Per Tube Daily  . pantoprazole sodium  40 mg Per Tube Daily  . polyethylene glycol  17 g Per Tube Daily  . rOPINIRole  0.25 mg Per Tube q1800  . sucralfate  1 g Per Tube 4 times per day  . vancomycin  1,000 mg Intravenous Q12H  . vitamin B-12  1,000 mcg Per Tube Daily   Continuous Infusions: . feeding supplement (JEVITY 1.2 CAL) 1,000 mL (01/24/15 1747)   PRN Meds:.bisacodyl, morphine CONCENTRATE Assessment/Plan: Principal Problem:   HCAP (healthcare-associated pneumonia) Active Problems:   Dementia   Dysphasia   Staphylococcus hominis sepsis (HCC)   UTI (urinary tract infection) due to Enterococcus  Bacteremia  Patient is presenting from a nursing home with cough and congestion for several days. Had a chest x-ray done several days ago that showed pneumonia and was given 1 dose of IM ceftriaxone at the nursing home. She was also admitted 3 months ago for pneumonia. CXR here on admission showing possible medial right lower lobe airspace disease. EKG showing sinus tachycardia (HR 120) and QTc 498. Repeat CXR today showing L basilar atelectasis/ infiltrate but no convincing pulmonary edema. She is currently afebrile now and leukocytosis has resolved. HR normal now but patient is still tachypnic. Satting 97-100% on RA. Urine culture growing E.Coli. Blood culture from 11/19 (1 set) growing Staph hominis. Repeat blood culture from 11/20 showing no growth in 2 days.  -cont Vanc x 14 days, may need picc line -Albuterol nebulizer -Supplemental O2 -Pending repeat blood cx. Staph hominis could be a contaminant.    Frontotemporal Dementia  7 year history. Patient became wheelchair bound in 2013 and has been requiring total care. Resides at [facility] nursing home. She has a feeding tube. -Norco 5-325 mg -Morphine 5 mg q4 prn SOB -Meloxicam 7.5 mg daily  -Ativan 1 mg daily  -Baclofen 10 mg TID -Ropinirole 0.25 mg daily  -Neuro checks   Hypokalemia - K 3.3 today. Replete as needed.  B12 deficiency  -Cyanocobalamin   Constipation -Dulcolax suppository  -Miralax   GI ppx -Nexium 40 mg daily  -Sucralfate   DVT ppx -Lovenox  Diet Patient has a G tube in place for enteral nutrition.  -Free water per tube q4 hrs  -Multivitamin liquid -Consult to dietician   Code: FULL   Dispo: Disposition is deferred at this time, awaiting improvement of current medical problems.  Anticipated discharge in approximately 2-3 day(s).   The patient does have a current PCP [name], MD) and does need an [facility] hospital follow-up appointment after discharge.  The patient does not have transportation limitations that hinder transportation to clinic appointments.  .Services Needed at time of discharge: Y = Yes, Blank = No PT:   OT:   RN:   Equipment:   Other:     LOS: 3 days   John Giovanni, MD 01/24/2015, 5:56 PM

## 2015-01-24 NOTE — Progress Notes (Signed)
Attempted report 

## 2015-01-24 NOTE — Progress Notes (Signed)
[facility] Sister updated and aware of pt transfer.

## 2015-01-24 NOTE — Progress Notes (Signed)
Patient ID: [name] SchmidtChevella [name], female   DOB: 09/14/1970, 44 y.o.   MRN: [id number] Medicine attending: I personally examined this patient this morning together with resident physician Dr. John GiovanniVasundhra Rathore and we discussed a management plan. She remains afebrile on vancomycin and Zosyn. Identification of bacteria from blood culture on admission show Staphylococcus hominis. Urine culture growing enterococcus. No clinical change. Drooling of secretions from the mouth. She is alert and will make eye contact but is not verbally communicative and cannot follow commands. Lungs overall clear. Unable to cooperate with exam. Abdomen remains soft. Feeding gastrostomy tube in place. White blood count has now normalized. I believe she is back to her baseline status.  In view of blood culture positive for staph she will need 14 days of parenteral antibiotics. We will ask interventional radiology to evaluate her for a PICC catheter. 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 We will discontinue intravenous fluids and resume her outpatient fluid and nutritional regimen per G-tube. We will modify her antibiotics changing to single agent vancomycin.

## 2015-01-24 NOTE — Clinical Social Work Note (Signed)
Clinical Social Work Assessment  Patient Details  Name: [name] SchmidtChevella [name] MRN: [id number] Date of Birth: 06/26/1970  Date of referral:  01/24/15               Reason for consult:  Facility Placement                Permission sought to share information with:  Other (Patient's sister [name] [name] contacted as patient nonverbal.) Permission granted to share information::  No (Sister contacted as patient is nonverbal.)  Name::     [name] ErikssonJanet [name]  Agency::     Relationship::  Sister  Contact Information:  [phone], ext [phone] (work)  Housing/Transportation Living arrangements for the past 2 months:  Skilled Nursing Facility [facility] Source of Information:  Other (Comment Required) (Sister, [name] [name]) Patient Interpreter Needed:  None Criminal Activity/Legal Involvement Pertinent to Current Situation/Hospitalization:  No - Comment as needed Significant Relationships:  Siblings Lives with:    Do you feel safe going back to the place where you live?  Yes Need for family participation in patient care:  Yes (Comment)  Care giving concerns:  No concerns expressed by patient's sister.   Social Worker assessment / plan:  CSW talked with patient's sister, [name] [name] by phone to confirm d/c plans, and per sister[facility]  Employment status:  Disabled (Comment on whether or not currently receiving Disability) Insurance information:  Teacher, English as a foreign languageManaged Medicare, [place] PT Recommendations:  Not assessed at this time Information / Referral to community resources:  Other (Comment Required) (None needed or requested at this time)  Patient/Family's Response to care:  No concerns expressed or questions regarding patient's care. Sister was concerned regarding how patient is progressing medically.  Patient/Family's Understanding of and Emotional Response to Diagnosis, Current Treatment, and Prognosis:  Not discussed.  Emotional  Assessment Appearance:  Other (Comment Required (Did not visit with patient) Attitude/Demeanor/Rapport:    Affect (typically observed):  Unable to Assess Orientation:  Oriented to Self Alcohol / Substance use:  Never Used Psych involvement (Current and /or in the community):  No (Comment)  Discharge Needs  Concerns to be addressed:  Discharge Planning Concerns Readmission within the last 30 days:  No Current discharge risk:  None Barriers to Discharge:  No Barriers Identified   Cristobal GoldmannCrawford, Tichina Koebel Bradley, LCSW 01/24/2015, 5:08 PM

## 2015-01-24 NOTE — Progress Notes (Signed)
ANTIBIOTIC CONSULT NOTE - FOLLOW UP  Pharmacy Consult for Vancomycin Indication: bacteremia  Allergies  Allergen Reactions  . Sulfa Antibiotics Other (See Comments)    Per [facility]    Patient Measurements: Height:  (160 cm) Weight: 138 lb 3.7 oz (62.7 kg) IBW/kg (Calculated) : 52.4  Vital Signs: Temp: 98.4 F (36.9 C) (11/22 1211) Temp Source: Axillary (11/22 1211) BP: 127/91 mmHg (11/22 1210) Pulse Rate: 100 (11/22 1210) Intake/Output from previous day: 11/21 0701 - 11/22 0700 In: 2741.3 [I.V.:561.3; NG/GT:1730; IV Piggyback:450] Out: -  Intake/Output from this shift: Total I/O In: 600 [NG/GT:300; IV Piggyback:300] Out: -   Labs:  Recent Labs  01/22/15 0247 01/23/15 0335 01/24/15 0412 01/24/15 0612  WBC 16.7* 11.0*  --  7.4  HGB 11.3* 11.1*  --  10.9*  PLT 178 208  --  228  CREATININE 0.64 0.65 0.66  --    Estimated Creatinine Clearance: 73.5 mL/min (by C-G formula based on Cr of 0.66).  Recent Labs  01/24/15 0839  [facility] 13     Microbiology: Recent Results (from the past 720 hour(s))  Culture, blood (routine x 2)     Status: None (Preliminary result)   Collection Time: 01/21/15  3:55 PM  Result Value Ref Range Status   Specimen Description BLOOD LEFT FOREARM  Final   Special Requests IN PEDIATRIC BOTTLE 1CC  Final   Culture NO GROWTH 3 DAYS  Final   Report Status PENDING  Incomplete  Culture, blood (routine x 2)     Status: None (Preliminary result)   Collection Time: 01/21/15  4:00 PM  Result Value Ref Range Status   Specimen Description BLOOD RIGHT FOREARM  Final   Special Requests BOTTLES DRAWN AEROBIC AND ANAEROBIC 5CC  Final   Culture  Setup Time   Final    GRAM POSITIVE COCCI IN CLUSTERS IN BOTH AEROBIC AND ANAEROBIC BOTTLES CRITICAL RESULT CALLED TO, READ BACK BY AND VERIFIED WITH: R. MYRICK,RN AT 1315 ON 112016 BY S. YARBROUGH    Culture STAPHYLOCOCCUS HOMINIS  Final   Report Status PENDING  Incomplete   Organism ID, Bacteria  STAPHYLOCOCCUS HOMINIS  Final      Susceptibility   Staphylococcus hominis - MIC*    CIPROFLOXACIN >=8 RESISTANT Resistant     ERYTHROMYCIN <=0.25 SENSITIVE Sensitive     GENTAMICIN <=0.5 SENSITIVE Sensitive     OXACILLIN >=4 RESISTANT Resistant     TETRACYCLINE >=16 RESISTANT Resistant     VANCOMYCIN 1 SENSITIVE Sensitive     TRIMETH/SULFA 80 RESISTANT Resistant     CLINDAMYCIN <=0.25 SENSITIVE Sensitive     RIFAMPIN <=0.5 SENSITIVE Sensitive     Inducible Clindamycin NEGATIVE Sensitive     * STAPHYLOCOCCUS HOMINIS  Urine culture     Status: None (Preliminary result)   Collection Time: 01/21/15  4:45 PM  Result Value Ref Range Status   Specimen Description URINE, CATHETERIZED  Final   Special Requests NONE  Final   Culture   Final    >=100,000 COLONIES/mL ENTEROCOCCUS SPECIES SUSCEPTIBILITIES TO FOLLOW    Report Status PENDING  Incomplete  MRSA PCR Screening     Status: None   Collection Time: 01/22/15 12:09 AM  Result Value Ref Range Status   MRSA by PCR NEGATIVE NEGATIVE Final    Comment:        The GeneXpert MRSA Assay (FDA approved for NASAL specimens only), is one component of a comprehensive MRSA colonization surveillance program. It is not intended to diagnose MRSA  infection nor to guide or monitor treatment for MRSA infections.   Culture, blood (routine x 2)     Status: None (Preliminary result)   Collection Time: 01/22/15  6:57 PM  Result Value Ref Range Status   Specimen Description BLOOD RIGHT HAND  Final   Special Requests IN PEDIATRIC BOTTLE 3CC  Final   Culture  Setup Time   Final    GRAM POSITIVE COCCI IN CLUSTERS AEROBIC BOTTLE ONLY CRITICAL RESULT CALLED TO, READ BACK BY AND VERIFIED WITH: S DOTY 01/23/15 @ 1324 M VESTAL    Culture GRAM POSITIVE COCCI  Final   Report Status PENDING  Incomplete  Culture, blood (routine x 2)     Status: None (Preliminary result)   Collection Time: 01/22/15  7:08 PM  Result Value Ref Range Status   Specimen  Description BLOOD RIGHT HAND  Final   Special Requests BOTTLES DRAWN AEROBIC ONLY 5CC  Final   Culture NO GROWTH 2 DAYS  Final   Report Status PENDING  Incomplete    Anti-infectives    Start     Dose/Rate Route Frequency Ordered Stop   01/24/15 1030  vancomycin (VANCOCIN) IVPB 1000 mg/200 mL premix     1,000 mg 200 mL/hr over 60 Minutes Intravenous Every 12 hours 01/24/15 1020     01/22/15 2100  vancomycin (VANCOCIN) IVPB 750 mg/150 ml premix  Status:  Discontinued     750 mg 150 mL/hr over 60 Minutes Intravenous Every 12 hours 01/22/15 1012 01/24/15 1020   01/22/15 0130  vancomycin (VANCOCIN) IVPB 750 mg/150 ml premix  Status:  Discontinued     750 mg 150 mL/hr over 60 Minutes Intravenous Every 8 hours 01/21/15 1706 01/22/15 1012   01/22/15 0130  piperacillin-tazobactam (ZOSYN) IVPB 3.375 g  Status:  Discontinued     3.375 g 12.5 mL/hr over 240 Minutes Intravenous 3 times per day 01/21/15 1706 01/24/15 1047   01/21/15 1630  vancomycin (VANCOCIN) IVPB 1000 mg/200 mL premix     1,000 mg 200 mL/hr over 60 Minutes Intravenous  Once 01/21/15 1626 01/21/15 1810   01/21/15 1630  piperacillin-tazobactam (ZOSYN) IVPB 3.375 g     3.375 g 100 mL/hr over 30 Minutes Intravenous  Once 01/21/15 1626 01/21/15 1757      Assessment: 44 yo female presented on 11/19 with sepsis (T 100.8, HR 107, WBC 19). [facility] Patient received 1g IM ceftriaxone outpatient. On 11/19, empiric therapy was initiated for sepsis with vanc and zosyn. One out of two blood cultures from 11/19 have returned growing staph hominis, sensitive to vanc.   Patient is currently afebrile and WBC has trended down to 7.4. Renal function is at patient's baseline and is stable (Scr 0.66, CrCl 73.5). Vanc trough this am was subtherapeutic at 13 ug/mL.  Goal of Therapy:  Vancomycin trough level 15-20 mcg/ml  Plan:  - Increase vancomycin to 1000mg  IV q12h - DC  zosyn - Follow renal function, cultures, LOT, and vanc level at steady state  Raquel SarnaKristin Aloi, Student Pharmacist 01/24/2015,1:03 PM   **I have reviewed this note and agree with its contents. Vancomycin increased this AM d/t subtherapeutic trough. Will plan to recheck at [facility].**  Arcola JanskyMeagan Joncarlo Friberg, PharmD Clinical Pharmacy Resident Pager: [phone]

## 2015-01-25 DIAGNOSIS — J189 Pneumonia, unspecified organism: Secondary | ICD-10-CM

## 2015-01-25 DIAGNOSIS — Z2239 Carrier of other specified bacterial diseases: Secondary | ICD-10-CM

## 2015-01-25 DIAGNOSIS — Y95 Nosocomial condition: Secondary | ICD-10-CM

## 2015-01-25 LAB — CBC
HEMATOCRIT: 33 % — AB (ref 36.0–46.0)
HEMOGLOBIN: 10.3 g/dL — AB (ref 12.0–15.0)
MCH: 27.5 pg (ref 26.0–34.0)
MCHC: 31.2 g/dL (ref 30.0–36.0)
MCV: 88 fL (ref 78.0–100.0)
Platelets: 242 10*3/uL (ref 150–400)
RBC: 3.75 MIL/uL — AB (ref 3.87–5.11)
RDW: 14.7 % (ref 11.5–15.5)
WBC: 7.6 10*3/uL (ref 4.0–10.5)

## 2015-01-25 LAB — CULTURE, BLOOD (ROUTINE X 2)

## 2015-01-25 LAB — GLUCOSE, CAPILLARY
GLUCOSE-CAPILLARY: 110 mg/dL — AB (ref 65–99)
GLUCOSE-CAPILLARY: 111 mg/dL — AB (ref 65–99)
GLUCOSE-CAPILLARY: 126 mg/dL — AB (ref 65–99)
GLUCOSE-CAPILLARY: 82 mg/dL (ref 65–99)
Glucose-Capillary: 111 mg/dL — ABNORMAL HIGH (ref 65–99)
Glucose-Capillary: 121 mg/dL — ABNORMAL HIGH (ref 65–99)
Glucose-Capillary: 101 mg/dL — ABNORMAL HIGH (ref 65–99)

## 2015-01-25 LAB — URINE CULTURE

## 2015-01-25 LAB — BASIC METABOLIC PANEL
Anion gap: 7 (ref 5–15)
BUN: 8 mg/dL (ref 6–20)
CHLORIDE: 109 mmol/L (ref 101–111)
CO2: 26 mmol/L (ref 22–32)
Calcium: 8.4 mg/dL — ABNORMAL LOW (ref 8.9–10.3)
Creatinine, Ser: 0.55 mg/dL (ref 0.44–1.00)
GFR calc non Af Amer: >60 mL/min (ref >60)
Glucose, Bld: 106 mg/dL — ABNORMAL HIGH (ref 65–99)
POTASSIUM: 3.3 mmol/L — AB (ref 3.5–5.1)
SODIUM: 142 mmol/L (ref 135–145)

## 2015-01-25 LAB — MAGNESIUM: MAGNESIUM: 2.1 mg/dL (ref 1.7–2.4)

## 2015-01-25 MED ORDER — CEFUROXIME AXETIL 500 MG PO TABS
500.0000 mg | ORAL_TABLET | Freq: Two times a day (BID) | ORAL | Status: DC
  Administered 2015-01-25 – 2015-01-27 (×4): 500 mg
  Filled 2015-01-25 (×4): qty 1

## 2015-01-25 MED ORDER — POTASSIUM CHLORIDE 10 MEQ/100ML IV SOLN
10.0000 meq | INTRAVENOUS | Status: AC
  Administered 2015-01-25 (×4): 10 meq via INTRAVENOUS
  Filled 2015-01-25 (×3): qty 100

## 2015-01-25 NOTE — Progress Notes (Signed)
Patient ID: [name] [name], female   DOB: 11/17/1970, 44 y.o.   MRN: [id number] Medicine attending: I personally examined this patient this morning and reviewed pertinent clinical, and laboratory data together with resident physician Dr. John GiovanniVasundhra Rathore. We greatly appreciate infectious disease consultation. A second blood culture taken from her right hand yesterday is also growing staph hominis. Given increased risk for development of endocarditis, a 6 week course of parenteral vancomycin is recommended. Additional coverage for VRE in the urine not felt to be necessary. She remains afebrile. Exam unchanged. She will make eye contact but no purposeful movements and verbally uncommunicative. PEG tube site appears clean. White count now normalized at 7600. Down from 19,000 on admission. She is at high risk for aspiration but infiltrate on initial poor quality portable rotated chest x-ray not convincing and a follow-up study did not show an infiltrate so source of her infection remains unclear. With respect to possible CJD disease, this was a clinical not a biopsy diagnosis. However, per infection control recommendation, we will assure that appropriate advice given to other healthcare providers if she needs to have surgical procedures or if she dies. We will have to wait an additional 48 hours for mature survey cultures before attempting to have a PICC catheter placed. Therefore, she will need to remain in the hospital.

## 2015-01-25 NOTE — Progress Notes (Signed)
Subjective: Patient was seen and examined at bedside. She is non-verbal and does not follow commands, which is her baseline.  Objective: Vital signs in last 24 hours: Filed Vitals:   01/25/15 0112 01/25/15 0500 01/25/15 0555 01/25/15 0957  BP: 107/79  96/77 109/78  Pulse: 85  90 93  Temp: 98.1 F (36.7 C)  98.4 F (36.9 C) 98.5 F (36.9 C)  TempSrc: Axillary  Axillary Oral  Resp: 22  25 23   Height:      Weight:  140 lb 14 oz (63.9 kg)    SpO2: 99%  98% 100%   Weight change: 2 lb 10.3 oz (1.2 kg) No intake or output data in the 24 hours ending 01/25/15 1308 Physical Exam: Constitutional:  Decorticate posturing   Cardiovascular: Regular rate and rhythm. Intact distal pulses.  Pulmonary/Chest: CTAB. No rales or rhonchi appreciated.  Abdominal: Soft. Bowel sounds are normal. She exhibits no distension. There is no guarding.  PEG tube site appears clean and dry. Does not appear infected.  Musculoskeletal: She exhibits no edema.  Neurological:  Nonverbal Does not follow commands  Skin: Skin is warm and dry.   Lab Results: Basic Metabolic Panel:  Recent Labs Lab 01/24/15 0412 01/25/15 0453 01/25/15 0456  NA 144  --  142  K 3.3*  --  3.3*  CL 111  --  109  CO2 25  --  26  GLUCOSE 111*  --  106*  BUN 8  --  8  CREATININE 0.66  --  0.55  CALCIUM 8.3*  --  8.4*  MG 2.0 2.1  --   PHOS 4.2  --   --    Liver Function Tests:  Recent Labs Lab 01/21/15 1555 01/22/15 0247 01/24/15 0412  AST 24 20  --   ALT 32 23  --   ALKPHOS 115 79  --   BILITOT 0.5 1.1  --   PROT 8.5* 5.8*  --   ALBUMIN 3.7 2.5* 2.7*   CBC:  Recent Labs Lab 01/22/15 0247 01/23/15 0335 01/24/15 0612 01/25/15 0456  WBC 16.7* 11.0* 7.4 7.6  NEUTROABS 12.1* 6.5  --   --   HGB 11.3* 11.1* 10.9* 10.3*  HCT 36.1 34.7* 34.3* 33.0*  MCV 89.4 87.8 87.9 88.0  PLT 178 208 228 242   Urinalysis:  Recent Labs Lab 01/21/15 1645  COLORURINE AMBER*  LABSPEC 1.045*  PHURINE 5.0  GLUCOSEU  NEGATIVE  HGBUR LARGE*  BILIRUBINUR NEGATIVE  KETONESUR 15*  PROTEINUR >300*  NITRITE NEGATIVE  LEUKOCYTESUR SMALL*   Misc. Labs:   Micro Results: Recent Results (from the past 240 hour(s))  Culture, blood (routine x 2)     Status: None (Preliminary result)   Collection Time: 01/21/15  3:55 PM  Result Value Ref Range Status   Specimen Description BLOOD LEFT FOREARM  Final   Special Requests IN PEDIATRIC BOTTLE 1CC  Final   Culture NO GROWTH 4 DAYS  Final   Report Status PENDING  Incomplete  Culture, blood (routine x 2)     Status: None   Collection Time: 01/21/15  4:00 PM  Result Value Ref Range Status   Specimen Description BLOOD RIGHT FOREARM  Final   Special Requests BOTTLES DRAWN AEROBIC AND ANAEROBIC 5CC  Final   Culture  Setup Time   Final    GRAM POSITIVE COCCI IN CLUSTERS IN BOTH AEROBIC AND ANAEROBIC BOTTLES CRITICAL RESULT CALLED TO, READ BACK BY AND VERIFIED WITH: R. MYRICK,RN AT 1315 ON [id number]  BY Lucienne CapersS. YARBROUGH    Culture STAPHYLOCOCCUS HOMINIS  Final   Report Status 01/25/2015 FINAL  Final   Organism ID, Bacteria STAPHYLOCOCCUS HOMINIS  Final      Susceptibility   Staphylococcus hominis - MIC*    CIPROFLOXACIN >=8 RESISTANT Resistant     ERYTHROMYCIN <=0.25 SENSITIVE Sensitive     GENTAMICIN <=0.5 SENSITIVE Sensitive     OXACILLIN >=4 RESISTANT Resistant     TETRACYCLINE >=16 RESISTANT Resistant     VANCOMYCIN 1 SENSITIVE Sensitive     TRIMETH/SULFA 80 RESISTANT Resistant     CLINDAMYCIN <=0.25 SENSITIVE Sensitive     RIFAMPIN <=0.5 SENSITIVE Sensitive     Inducible Clindamycin NEGATIVE Sensitive     * STAPHYLOCOCCUS HOMINIS  Urine culture     Status: None   Collection Time: 01/21/15  4:45 PM  Result Value Ref Range Status   Specimen Description URINE, CATHETERIZED  Final   Special Requests NONE  Final   Culture   Final    >=100,000 COLONIES/mL VANCOMYCIN RESISTANT ENTEROCOCCUS ISOLATED   Report Status 01/25/2015 FINAL  Final   Organism ID, Bacteria  VANCOMYCIN RESISTANT ENTEROCOCCUS ISOLATED  Final      Susceptibility   Vancomycin resistant enterococcus isolated - MIC*    AMPICILLIN <=2 SENSITIVE Sensitive     LEVOFLOXACIN >=8 RESISTANT Resistant     NITROFURANTOIN <=16 SENSITIVE Sensitive     VANCOMYCIN >=32 RESISTANT Resistant     LINEZOLID 2 SENSITIVE Sensitive     * >=100,000 COLONIES/mL VANCOMYCIN RESISTANT ENTEROCOCCUS ISOLATED  MRSA PCR Screening     Status: None   Collection Time: 01/22/15 12:09 AM  Result Value Ref Range Status   MRSA by PCR NEGATIVE NEGATIVE Final    Comment:        The GeneXpert MRSA Assay (FDA approved for NASAL specimens only), is one component of a comprehensive MRSA colonization surveillance program. It is not intended to diagnose MRSA infection nor to guide or monitor treatment for MRSA infections.   Culture, blood (routine x 2)     Status: None   Collection Time: 01/22/15  6:57 PM  Result Value Ref Range Status   Specimen Description BLOOD RIGHT HAND  Final   Special Requests IN PEDIATRIC BOTTLE 3CC  Final   Culture  Setup Time   Final    GRAM POSITIVE COCCI IN CLUSTERS AEROBIC BOTTLE ONLY CRITICAL RESULT CALLED TO, READ BACK BY AND VERIFIED WITH: S DOTY 01/23/15 @ 1324 M VESTAL    Culture   Final    STAPHYLOCOCCUS HOMINIS SUSCEPTIBILITIES PERFORMED ON PREVIOUS CULTURE WITHIN THE LAST 5 DAYS.    Report Status 01/25/2015 FINAL  Final  Culture, blood (routine x 2)     Status: None (Preliminary result)   Collection Time: 01/22/15  7:08 PM  Result Value Ref Range Status   Specimen Description BLOOD RIGHT HAND  Final   Special Requests BOTTLES DRAWN AEROBIC ONLY 5CC  Final   Culture NO GROWTH 3 DAYS  Final   Report Status PENDING  Incomplete  Culture, blood (routine x 2)     Status: None (Preliminary result)   Collection Time: 01/24/15  3:56 AM  Result Value Ref Range Status   Specimen Description BLOOD RIGHT ARM  Final   Special Requests IN PEDIATRIC BOTTLE 1CC  Final   Culture NO  GROWTH 1 DAY  Final   Report Status PENDING  Incomplete  Culture, blood (routine x 2)     Status: None (Preliminary result)  Collection Time: 01/24/15  4:12 AM  Result Value Ref Range Status   Specimen Description BLOOD RIGHT HAND  Final   Special Requests IN PEDIATRIC BOTTLE 1CC  Final   Culture  Setup Time   Final    GRAM POSITIVE COCCI IN CLUSTERS AEROBIC BOTTLE ONLY CRITICAL RESULT CALLED TO, READ BACK BY AND VERIFIED WITH: JEFF  01/25/15 MKELLY    Culture NO GROWTH 1 DAY  Final   Report Status PENDING  Incomplete   Studies/Results: No results found. Medications: I have reviewed the patient's current medications. Scheduled Meds: . albuterol  2.5 mg Nebulization TID  . antiseptic oral rinse  7 mL Mouth Rinse q12n4p  . baclofen  10 mg Per Tube TID  . chlorhexidine  15 mL Mouth Rinse BID  . enoxaparin (LOVENOX) injection  40 mg Subcutaneous Q24H  . feeding supplement (PRO-STAT SUGAR FREE 64)  30 mL Per Tube Daily  . free water  100 mL Per Tube Q4H  . HYDROcodone-acetaminophen  1 tablet Oral 2 times per day  . loratadine  10 mg Per Tube Daily  . meloxicam  7.5 mg Per Tube Q breakfast  . multivitamin  5 mL Per Tube Daily  . pantoprazole sodium  40 mg Per Tube Daily  . polyethylene glycol  17 g Per Tube Daily  . rOPINIRole  0.25 mg Per Tube q1800  . sucralfate  1 g Per Tube 4 times per day  . vancomycin  1,000 mg Intravenous Q12H  . vitamin B-12  1,000 mcg Per Tube Daily   Continuous Infusions: . feeding supplement (JEVITY 1.2 CAL) 1,000 mL (01/24/15 1747)   PRN Meds:.bisacodyl, morphine CONCENTRATE Assessment/Plan: Principal Problem:   HCAP (healthcare-associated pneumonia) Active Problems:   Dementia   Dysphasia   Staphylococcus hominis sepsis (HCC)   UTI (urinary tract infection) due to Enterococcus  Bacteremia  Patient is presenting from a nursing home with cough and congestion for several days. Had a chest x-ray done several days ago that showed pneumonia  and was given 1 dose of IM ceftriaxone at the nursing home. She was also admitted 3 months ago for pneumonia. CXR here on admission showing possible medial right lower lobe airspace disease. EKG showing sinus tachycardia (HR 120) and QTc 498. Repeat CXR showing L basilar atelectasis/ infiltrate but no convincing pulmonary edema. She is currently afebrile now and leukocytosis has resolved. HR normal now but patient is still tachypnic. Satting 97-100% on RA. Urine culture growing VRE. Blood culture from 11/19 (1 set) growing Staph hominis. Repeat blood cultures showing no growth in 3 days.   -cont Vanc x 14 days for the bacterremia, may need picc line -ID consult as urine culture positive for VRE -Albuterol nebulizer -Supplemental O2 -Pending final blood cx.  Frontotemporal Dementia  7 year history. Patient became wheelchair bound in 2013 and has been requiring total care. Resides at [facility] nursing home. She has a feeding tube. -Norco 5-325 mg -Morphine 5 mg q4 prn SOB -Meloxicam 7.5 mg daily  -Ativan 1 mg daily  -Baclofen 10 mg TID -Ropinirole 0.25 mg daily  -Neuro checks   Hypokalemia - K 3.3 today. Replete as needed.  B12 deficiency  -Cyanocobalamin   Constipation -Dulcolax suppository  -Miralax   GI ppx -Nexium 40 mg daily  -Sucralfate   DVT ppx -Lovenox  Diet Patient has a G tube in place for enteral nutrition.  -Free water per tube q4 hrs  -Multivitamin liquid -Consult to dietician   Code: FULL  Dispo: Disposition is deferred at this time, awaiting improvement of current medical problems.  Anticipated discharge in approximately 2-3 day(s).   The patient does have a current PCP [name], MD) and does need an [facility] hospital follow-up appointment after discharge.  The patient does not have transportation limitations that hinder transportation to clinic appointments.  .Services Needed at time of discharge: Y = Yes, Blank = No PT:   OT:   RN:     Equipment:   Other:     LOS: 4 days   John Giovanni, MD 01/25/2015, 1:08 PM

## 2015-01-25 NOTE — Progress Notes (Signed)
Critical value blood Culture gram positive cocci & clusters Aerobic bottle paged IM intern approx [id number].

## 2015-01-25 NOTE — Progress Notes (Signed)
Upon assessment, pt seems uncomfortable and she is grunting. Pt would not follow commands. Pt is stable at this time no other complications noted.

## 2015-01-25 NOTE — Progress Notes (Deleted)
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  Infection Prevention advises that that should this patient be taken to surgery performed on the brain, spine, and eyes, needs an EEG, or dies; please advise OR staff, Respiratory Therapy, Rehab, [facility].

## 2015-01-25 NOTE — Progress Notes (Signed)
Patient's stomach distended, tight. PEG site assessment clean, dry, intact, PEG patent, patient with no vomiting or signs of nausea. No recorded BM since 01/21/15. Suppository administered, patient had large loose stool. Stomach remains distended. MD aware, will continue to monitor.

## 2015-01-25 NOTE — Consult Note (Addendum)
[facility]  Total days of antibiotics 5        Day 5 vanco        Day 4 piptazo         Reason for Consult: staph hominis bacteremia    Referring Physician: granfortuna  Principal Problem:   HCAP (healthcare-associated pneumonia) Active Problems:   Dementia   Dysphasia   Staphylococcus hominis sepsis (HCC)   UTI (urinary tract infection) due to Enterococcus    HPI: [name] [name] is a 44 y.o. female  with a 7 year history of progressive cognitive and functional decline, thought to be due to early onset frontotemporal dementia.Had extensive workup, CJD in chart, but unlikely given her clinical picture started 7 yrs ago. She is minimally responsive with advanced neurodegeneration,Last followed at [facility] neurology 12 months ago. She is currently a SNF resident, with recent treatment for pneumonia roughly 1 month ago. She was admitted on 11/18 due to not being at her baseline, increased cough. She was evaluated for possible new source of infection. CXR suggesting RML infiltrate, she was started on vanco and piptazo for HCAP. Unable to send sputum cx but has had blood cx initially 1 of 2 sets on admit + for s.hominis, then on repeat blood cx 2/2 sets with s.hominis.  Past Medical History  Diagnosis Date  . Dementia   . Restless leg syndrome   . Esophageal reflux   . UTI (urinary tract infection)   . Depression   . Pneumonia   . Constipation   . Aphagia   . Bacteremia   . Sepsis (HCC)   . [name] disease   . Vitamin B12 deficiency   . Staphylococcus hominis sepsis (HCC) 01/24/2015  . UTI (urinary tract infection) due to Enterococcus 01/24/2015    Allergies:  Allergies  Allergen Reactions  . Sulfa Antibiotics Other (See Comments)    Per MAR    MEDICATIONS: . albuterol  2.5 mg Nebulization TID  . antiseptic oral rinse  7 mL Mouth Rinse q12n4p  . baclofen  10 mg Per Tube TID  . chlorhexidine  15 mL Mouth Rinse BID  . enoxaparin (LOVENOX)  injection  40 mg Subcutaneous Q24H  . feeding supplement (PRO-STAT SUGAR FREE 64)  30 mL Per Tube Daily  . free water  100 mL Per Tube Q4H  . HYDROcodone-acetaminophen  1 tablet Oral 2 times per day  . loratadine  10 mg Per Tube Daily  . meloxicam  7.5 mg Per Tube Q breakfast  . multivitamin  5 mL Per Tube Daily  . pantoprazole sodium  40 mg Per Tube Daily  . polyethylene glycol  17 g Per Tube Daily  . potassium chloride  10 mEq Intravenous Q1 Hr x 4  . rOPINIRole  0.25 mg Per Tube q1800  . sucralfate  1 g Per Tube 4 times per day  . vancomycin  1,000 mg Intravenous Q12H  . vitamin B-12  1,000 mcg Per Tube Daily    Social History  Substance Use Topics  . Smoking status: Never Smoker   . Smokeless tobacco: Not on file  . Alcohol Use: No    Family hx = no family history of neurodegenerative diseases, MS with sister, cancer in the family  Review of Systems -  Unable to obtain due to being comatose  OBJECTIVE: Temp:  [98 F (36.7 C)-98.9 F (37.2 C)] 98.5 F (36.9 C) (11/23 0957) Pulse Rate:  [80-100] 93 (11/23 0957) Resp:  [  22-29] 23 (11/23 0957) BP: (96-142)/(69-102) 109/78 mmHg (11/23 0957) SpO2:  [98 %-100 %] 100 % (11/23 0957) Weight:  [140 lb 14 oz (63.9 kg)] 140 lb 14 oz (63.9 kg) (11/23 0500) Physical Exam  Constitutional:  Eyes open but no tracking, no response to questions. No distress.  HENT: Violet/AT, PERRLA, no scleral icterus Mouth/Throat: Oropharynx is dry. Cardiovascular: Normal rate, regular rhythm and normal heart sounds. Exam reveals no gallop and no friction rub.  No murmur heard.  Pulmonary/Chest: Effort normal and breath sounds normal. No respiratory distress.  has no wheezes.  Neck = supple,  Abdominal: Soft. Bowel sounds are normal.  exhibits no distension. There is no tenderness. Peg in place Neurological: not following commands, upper and lower extremities contracted Skin: Skin is warm and dry. No rash noted. No erythema.  Psychiatric: non  verbal   LABS: Results for orders placed or performed during the hospital encounter of 01/21/15 (from the past 48 hour(s))  Glucose, capillary     Status: Abnormal   Collection Time: 01/23/15 12:02 PM  Result Value Ref Range   Glucose-Capillary 111 (H) 65 - 99 mg/dL   Comment 1 Notify RN    Comment 2 Document in Chart   Glucose, capillary     Status: Abnormal   Collection Time: 01/23/15  2:29 PM  Result Value Ref Range   Glucose-Capillary 108 (H) 65 - 99 mg/dL   Comment 1 Notify RN    Comment 2 Document in Chart   Glucose, capillary     Status: None   Collection Time: 01/23/15  4:29 PM  Result Value Ref Range   Glucose-Capillary 98 65 - 99 mg/dL  Culture, blood (routine x 2)     Status: None (Preliminary result)   Collection Time: 01/24/15  3:56 AM  Result Value Ref Range   Specimen Description BLOOD RIGHT ARM    Special Requests IN PEDIATRIC BOTTLE 1CC    Culture NO GROWTH 1 DAY    Report Status PENDING   Magnesium     Status: None   Collection Time: 01/24/15  4:12 AM  Result Value Ref Range   Magnesium 2.0 1.7 - 2.4 mg/dL  Culture, blood (routine x 2)     Status: None (Preliminary result)   Collection Time: 01/24/15  4:12 AM  Result Value Ref Range   Specimen Description BLOOD RIGHT HAND    Special Requests IN PEDIATRIC BOTTLE 1CC    Culture  Setup Time      GRAM POSITIVE COCCI IN CLUSTERS AEROBIC BOTTLE ONLY CRITICAL RESULT CALLED TO, READ BACK BY AND VERIFIED WITH: JEFF _0  01/25/15 MKELLY    Culture NO GROWTH 1 DAY    Report Status PENDING   Renal function panel     Status: Abnormal   Collection Time: 01/24/15  4:12 AM  Result Value Ref Range   Sodium 144 135 - 145 mmol/L   Potassium 3.3 (L) 3.5 - 5.1 mmol/L   Chloride 111 101 - 111 mmol/L   CO2 25 22 - 32 mmol/L   Glucose, Bld 111 (H) 65 - 99 mg/dL   BUN 8 6 - 20 mg/dL   Creatinine, Ser 0.66 0.44 - 1.00 mg/dL   Calcium 8.3 (L) 8.9 - 10.3 mg/dL   Phosphorus 4.2 2.5 - 4.6 mg/dL   Albumin 2.7 (L) 3.5 - 5.0  g/dL   GFR calc non Af Amer >60 >60 mL/min   GFR calc Af Amer >60 >60 mL/min    Comment: (NOTE) The  eGFR has been calculated using the CKD EPI equation. This calculation has not been validated in all clinical situations. eGFR's persistently <60 mL/min signify possible Chronic Kidney Disease.    Anion gap 8 5 - 15  CBC     Status: Abnormal   Collection Time: 01/24/15  6:12 AM  Result Value Ref Range   WBC 7.4 4.0 - 10.5 K/uL   RBC 3.90 3.87 - 5.11 MIL/uL   Hemoglobin 10.9 (L) 12.0 - 15.0 g/dL   HCT 34.3 (L) 36.0 - 46.0 %   MCV 87.9 78.0 - 100.0 fL   MCH 27.9 26.0 - 34.0 pg   MCHC 31.8 30.0 - 36.0 g/dL   RDW 14.9 11.5 - 15.5 %   Platelets 228 150 - 400 K/uL  Vancomycin, trough     Status: None   Collection Time: 01/24/15  8:39 AM  Result Value Ref Range   Vancomycin Tr 13 10.0 - 20.0 ug/mL  Glucose, capillary     Status: Abnormal   Collection Time: 01/24/15  4:11 PM  Result Value Ref Range   Glucose-Capillary 110 (H) 65 - 99 mg/dL   Comment 1 Notify RN    Comment 2 Document in Chart   Glucose, capillary     Status: None   Collection Time: 01/24/15  8:07 PM  Result Value Ref Range   Glucose-Capillary 97 65 - 99 mg/dL   Comment 1 Notify RN    Comment 2 Document in Chart   Glucose, capillary     Status: None   Collection Time: 01/24/15 11:56 PM  Result Value Ref Range   Glucose-Capillary 82 65 - 99 mg/dL   Comment 1 Notify RN    Comment 2 Document in Chart   Glucose, capillary     Status: Abnormal   Collection Time: 01/25/15  4:37 AM  Result Value Ref Range   Glucose-Capillary 111 (H) 65 - 99 mg/dL   Comment 1 Notify RN    Comment 2 Document in Chart   Magnesium     Status: None   Collection Time: 01/25/15  4:53 AM  Result Value Ref Range   Magnesium 2.1 1.7 - 2.4 mg/dL  CBC     Status: Abnormal   Collection Time: 01/25/15  4:56 AM  Result Value Ref Range   WBC 7.6 4.0 - 10.5 K/uL   RBC 3.75 (L) 3.87 - 5.11 MIL/uL   Hemoglobin 10.3 (L) 12.0 - 15.0 g/dL   HCT  33.0 (L) 36.0 - 46.0 %   MCV 88.0 78.0 - 100.0 fL   MCH 27.5 26.0 - 34.0 pg   MCHC 31.2 30.0 - 36.0 g/dL   RDW 14.7 11.5 - 15.5 %   Platelets 242 150 - 400 K/uL  Basic metabolic panel     Status: Abnormal   Collection Time: 01/25/15  4:56 AM  Result Value Ref Range   Sodium 142 135 - 145 mmol/L   Potassium 3.3 (L) 3.5 - 5.1 mmol/L   Chloride 109 101 - 111 mmol/L   CO2 26 22 - 32 mmol/L   Glucose, Bld 106 (H) 65 - 99 mg/dL   BUN 8 6 - 20 mg/dL   Creatinine, Ser 0.55 0.44 - 1.00 mg/dL   Calcium 8.4 (L) 8.9 - 10.3 mg/dL   GFR calc non Af Amer >60 >60 mL/min   GFR calc Af Amer >60 >60 mL/min    Comment: (NOTE) The eGFR has been calculated using the CKD EPI equation. This calculation has not  been validated in all clinical situations. eGFR's persistently <60 mL/min signify possible Chronic Kidney Disease.    Anion gap 7 5 - 15  Glucose, capillary     Status: Abnormal   Collection Time: 01/25/15  7:10 AM  Result Value Ref Range   Glucose-Capillary 111 (H) 65 - 99 mg/dL  Glucose, capillary     Status: Abnormal   Collection Time: 01/25/15 10:56 AM  Result Value Ref Range   Glucose-Capillary 126 (H) 65 - 99 mg/dL    MICRO: 11/23 blood cx pending 11/22 blood cx in 1 of 2 sets GPC clusters 11/20 blood cx in 1 of 4 bottles s.hominis 11/18 blood dc in 1 of 2 sets s.hominis 11/18 ucx VRE  IMAGING: 11/21 cxr FINDINGS: Cardiomediastinal silhouette is stable. Central mild vascular congestion without convincing pulmonary edema. Mild elevation of the left hemidiaphragm again noted. Left basilar atelectasis or Infiltrate.  11/18 cxr possible right middle lobe infiltrate but patient is somewhat rotated  Assessment/Plan:  44yo F with advanced neurodegenerative disease with FTD, minimally responsive admitted with leukocytosis of 19K, fever of 100.8, with cxr infiltrate. Improved with IV vanco and piptazo, with WBC back to normal. 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 Patient contractures preclude her from getting TTE or TEE.  S.hominis complicated bacteremia = -recommend to treat her as presumed NVE with 6 wks of vancomycin. Goal trough of 15-20. 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  Elzie Rings [place] [facility] [phone]

## 2015-01-26 LAB — GLUCOSE, CAPILLARY
Glucose-Capillary: 117 mg/dL — ABNORMAL HIGH (ref 65–99)
Glucose-Capillary: 101 mg/dL — ABNORMAL HIGH (ref 65–99)
Glucose-Capillary: 99 mg/dL (ref 65–99)
Glucose-Capillary: 103 mg/dL — ABNORMAL HIGH (ref 65–99)

## 2015-01-26 LAB — BASIC METABOLIC PANEL
Anion gap: 7 (ref 5–15)
BUN: 8 mg/dL (ref 6–20)
CO2: 25 mmol/L (ref 22–32)
Calcium: 8.7 mg/dL — ABNORMAL LOW (ref 8.9–10.3)
Chloride: 107 mmol/L (ref 101–111)
Creatinine, Ser: 0.6 mg/dL (ref 0.44–1.00)
GFR calc Af Amer: >60 mL/min (ref >60)
GLUCOSE: 106 mg/dL — AB (ref 65–99)
POTASSIUM: 3.8 mmol/L (ref 3.5–5.1)
Sodium: 139 mmol/L (ref 135–145)

## 2015-01-26 LAB — CULTURE, BLOOD (ROUTINE X 2): CULTURE: NO GROWTH

## 2015-01-26 LAB — CBC
HEMATOCRIT: 34 % — AB (ref 36.0–46.0)
Hemoglobin: 10.7 g/dL — ABNORMAL LOW (ref 12.0–15.0)
MCH: 27.4 pg (ref 26.0–34.0)
MCHC: 31.5 g/dL (ref 30.0–36.0)
MCV: 87.2 fL (ref 78.0–100.0)
Platelets: 248 10*3/uL (ref 150–400)
RBC: 3.9 MIL/uL (ref 3.87–5.11)
RDW: 14.6 % (ref 11.5–15.5)
WBC: 7.8 10*3/uL (ref 4.0–10.5)

## 2015-01-26 MED ORDER — PANCRELIPASE (LIP-PROT-AMYL) 12000-38000 UNITS PO CPEP
2.0000 | ORAL_CAPSULE | Freq: Once | ORAL | Status: AC
  Administered 2015-01-26: 24000 [IU] via ORAL
  Filled 2015-01-26: qty 2

## 2015-01-26 MED ORDER — SODIUM BICARBONATE 650 MG PO TABS
650.0000 mg | ORAL_TABLET | Freq: Once | ORAL | Status: AC
  Administered 2015-01-26: 650 mg via ORAL
  Filled 2015-01-26: qty 1

## 2015-01-26 NOTE — Progress Notes (Signed)
[facility]    Date of Admission:  01/21/2015   Total days of antibiotics 6        Day 6 vancomycin        Day 2 cefuroxime           ID: [name] [name] is a 44 y.o. female with FTD, minimal consciousness, found to have s.hominis bacteremia  Principal Problem:   HCAP (healthcare-associated pneumonia) Active Problems:   Dementia   Dysphasia   Staphylococcus hominis sepsis (HCC)   UTI (urinary tract infection) due to Enterococcus    Subjective: afebrile  ROS: unable to obtain due to being non verbal Medications:  . albuterol  2.5 mg Nebulization TID  . antiseptic oral rinse  7 mL Mouth Rinse q12n4p  . baclofen  10 mg Per Tube TID  . cefUROXime  500 mg Per Tube BID WC  . chlorhexidine  15 mL Mouth Rinse BID  . enoxaparin (LOVENOX) injection  40 mg Subcutaneous Q24H  . feeding supplement (PRO-STAT SUGAR FREE 64)  30 mL Per Tube Daily  . free water  100 mL Per Tube Q4H  . HYDROcodone-acetaminophen  1 tablet Oral 2 times per day  . loratadine  10 mg Per Tube Daily  . meloxicam  7.5 mg Per Tube Q breakfast  . multivitamin  5 mL Per Tube Daily  . pantoprazole sodium  40 mg Per Tube Daily  . polyethylene glycol  17 g Per Tube Daily  . rOPINIRole  0.25 mg Per Tube q1800  . sucralfate  1 g Per Tube 4 times per day  . vancomycin  1,000 mg Intravenous Q12H  . vitamin B-12  1,000 mcg Per Tube Daily    Objective: Vital signs in last 24 hours: Temp:  [97.5 F (36.4 C)-99 F (37.2 C)] 99 F (37.2 C) (11/24 1055) Pulse Rate:  [79-100] 92 (11/24 1055) Resp:  [20-26] 20 (11/24 1055) BP: (99-133)/(69-96) 126/96 mmHg (11/24 1055) SpO2:  [98 %-100 %] 100 % (11/24 1055) Weight:  [145 lb 1 oz (65.8 kg)] 145 lb 1 oz (65.8 kg) (11/24 0555) Physical Exam  Constitutional:  Eyes closed. Laying in bed. No distress.  Cardiovascular: Normal rate, regular rhythm and normal heart sounds. Exam reveals no gallop and no friction rub.  No murmur heard.    Pulmonary/Chest: Effort normal and breath sounds normal. No respiratory distress.  has no wheezes.  Neck = supple, no nuchal rigidity Abdominal: Soft. Bowel sounds are normal.  exhibits mild  distension. There is no tenderness. PEG in place Neurological: does not answer questions, extremities contracted   Lab Results  Recent Labs  01/25/15 0456 01/26/15 0436  WBC 7.6 7.8  HGB 10.3* 10.7*  HCT 33.0* 34.0*  NA 142 139  K 3.3* 3.8  CL 109 107  CO2 26 25  BUN 8 8  CREATININE 0.55 0.60   Liver Panel  Recent Labs  01/24/15 0412  ALBUMIN 2.7*    Microbiology: 11/22 blood cx 1 of 4 bottles [facility] 11/23 blood cx NGTD at 28 hr  Assessment/Plan: S.hominis bacteremia = noted from blood cx from 11/18 thru 11/22, concern for NVE. Will treat as possible endocarditis with 6 wk of vancomycin. If blood cx from 11/23 remain  NGTD on 11/26, can place picc line.  HCAP = finish a total 7 days days of abtx tomorrow  FTD, miniminal consciousness = appears at her baseline. Previously when her symptoms started it appeared that she was considered to  have CJD but since then clinical picture is not c/w CJD, thus does not have this diagnosis as cause of her advanced neurodegenerative disease, [facility].      Drue SecondSNIDER, [facility] Cell: [phone] Pager: [phone]  01/26/2015, 11:45 AM

## 2015-01-26 NOTE — Progress Notes (Signed)
Unable to declog peg tube this am. MD notified. Provided tube feeding protocol per MD order and was successful. Peg tube unclogged, medication and feeding administered. Pt repositioned in bed. Am and oral care provided. No noted distress. Will continue to monitor.

## 2015-01-26 NOTE — Progress Notes (Signed)
Subjective: Patient was seen and examined at bedside. She is non-verbal and does not follow commands, which is her baseline.  Objective: Vital signs in last 24 hours: Filed Vitals:   01/26/15 0555 01/26/15 0846 01/26/15 1055 01/26/15 1348  BP: 121/84  126/96   Pulse: 100  92   Temp: 98.4 F (36.9 C)  99 F (37.2 C)   TempSrc: Axillary  Axillary   Resp: 24  20   Height:      Weight: 145 lb 1 oz (65.8 kg)     SpO2: 99% 98% 100% 100%   Weight change: 4 lb 3 oz (1.9 kg) No intake or output data in the 24 hours ending 01/26/15 1353 Physical Exam: Constitutional:  Decorticate posturing   Cardiovascular: Regular rate and rhythm. Intact distal pulses.  Pulmonary/Chest: CTAB. No rales or rhonchi appreciated.  Abdominal: Soft. Bowel sounds are normal. She exhibits no distension. There is no guarding.  PEG tube site appears clean and dry. Does not appear infected.  Musculoskeletal: She exhibits no edema.  Neurological:  Nonverbal Does not follow commands  Skin: Skin is warm and dry.   Lab Results: Basic Metabolic Panel:  Recent Labs Lab 01/24/15 0412 01/25/15 0453 01/25/15 0456 01/26/15 0436  NA 144  --  142 139  K 3.3*  --  3.3* 3.8  CL 111  --  109 107  CO2 25  --  26 25  GLUCOSE 111*  --  106* 106*  BUN 8  --  8 8  CREATININE 0.66  --  0.55 0.60  CALCIUM 8.3*  --  8.4* 8.7*  MG 2.0 2.1  --   --   PHOS 4.2  --   --   --    Liver Function Tests:  Recent Labs Lab 01/21/15 1555 01/22/15 0247 01/24/15 0412  AST 24 20  --   ALT 32 23  --   ALKPHOS 115 79  --   BILITOT 0.5 1.1  --   PROT 8.5* 5.8*  --   ALBUMIN 3.7 2.5* 2.7*   CBC:  Recent Labs Lab 01/22/15 0247 01/23/15 0335  01/25/15 0456 01/26/15 0436  WBC 16.7* 11.0*  < > 7.6 7.8  NEUTROABS 12.1* 6.5  --   --   --   HGB 11.3* 11.1*  < > 10.3* 10.7*  HCT 36.1 34.7*  < > 33.0* 34.0*  MCV 89.4 87.8  < > 88.0 87.2  PLT 178 208  < > 242 248  < > = values in this interval not  displayed. Urinalysis:  Recent Labs Lab 01/21/15 1645  COLORURINE AMBER*  LABSPEC 1.045*  PHURINE 5.0  GLUCOSEU NEGATIVE  HGBUR LARGE*  BILIRUBINUR NEGATIVE  KETONESUR 15*  PROTEINUR >300*  NITRITE NEGATIVE  LEUKOCYTESUR SMALL*   Misc. Labs:   Micro Results: Recent Results (from the past 240 hour(s))  Culture, blood (routine x 2)     Status: None   Collection Time: 01/21/15  3:55 PM  Result Value Ref Range Status   Specimen Description BLOOD LEFT FOREARM  Final   Special Requests IN PEDIATRIC BOTTLE 1CC  Final   Culture NO GROWTH 5 DAYS  Final   Report Status 01/26/2015 FINAL  Final  Culture, blood (routine x 2)     Status: None   Collection Time: 01/21/15  4:00 PM  Result Value Ref Range Status   Specimen Description BLOOD RIGHT FOREARM  Final   Special Requests BOTTLES DRAWN AEROBIC AND ANAEROBIC 5CC  Final  Culture  Setup Time   Final    GRAM POSITIVE COCCI IN CLUSTERS IN BOTH AEROBIC AND ANAEROBIC BOTTLES CRITICAL RESULT CALLED TO, READ BACK BY AND VERIFIED WITH: R. MYRICK,RN AT 1315 ON [id number] BY S. YARBROUGH    Culture STAPHYLOCOCCUS HOMINIS  Final   Report Status 01/25/2015 FINAL  Final   Organism ID, Bacteria STAPHYLOCOCCUS HOMINIS  Final      Susceptibility   Staphylococcus hominis - MIC*    CIPROFLOXACIN >=8 RESISTANT Resistant     ERYTHROMYCIN <=0.25 SENSITIVE Sensitive     GENTAMICIN <=0.5 SENSITIVE Sensitive     OXACILLIN >=4 RESISTANT Resistant     TETRACYCLINE >=16 RESISTANT Resistant     VANCOMYCIN 1 SENSITIVE Sensitive     TRIMETH/SULFA 80 RESISTANT Resistant     CLINDAMYCIN <=0.25 SENSITIVE Sensitive     RIFAMPIN <=0.5 SENSITIVE Sensitive     Inducible Clindamycin NEGATIVE Sensitive     * STAPHYLOCOCCUS HOMINIS  Urine culture     Status: None   Collection Time: 01/21/15  4:45 PM  Result Value Ref Range Status   Specimen Description URINE, CATHETERIZED  Final   Special Requests NONE  Final   Culture   Final    >=100,000 COLONIES/mL  VANCOMYCIN RESISTANT ENTEROCOCCUS ISOLATED   Report Status 01/25/2015 FINAL  Final   Organism ID, Bacteria VANCOMYCIN RESISTANT ENTEROCOCCUS ISOLATED  Final      Susceptibility   Vancomycin resistant enterococcus isolated - MIC*    AMPICILLIN <=2 SENSITIVE Sensitive     LEVOFLOXACIN >=8 RESISTANT Resistant     NITROFURANTOIN <=16 SENSITIVE Sensitive     VANCOMYCIN >=32 RESISTANT Resistant     LINEZOLID 2 SENSITIVE Sensitive     * >=100,000 COLONIES/mL VANCOMYCIN RESISTANT ENTEROCOCCUS ISOLATED  MRSA PCR Screening     Status: None   Collection Time: 01/22/15 12:09 AM  Result Value Ref Range Status   MRSA by PCR NEGATIVE NEGATIVE Final    Comment:        The GeneXpert MRSA Assay (FDA approved for NASAL specimens only), is one component of a comprehensive MRSA colonization surveillance program. It is not intended to diagnose MRSA infection nor to guide or monitor treatment for MRSA infections.   Culture, blood (routine x 2)     Status: None   Collection Time: 01/22/15  6:57 PM  Result Value Ref Range Status   Specimen Description BLOOD RIGHT HAND  Final   Special Requests IN PEDIATRIC BOTTLE 3CC  Final   Culture  Setup Time   Final    GRAM POSITIVE COCCI IN CLUSTERS AEROBIC BOTTLE ONLY CRITICAL RESULT CALLED TO, READ BACK BY AND VERIFIED WITH: S DOTY 01/23/15 @ 1324 M VESTAL    Culture   Final    STAPHYLOCOCCUS HOMINIS SUSCEPTIBILITIES PERFORMED ON PREVIOUS CULTURE WITHIN THE LAST 5 DAYS.    Report Status 01/25/2015 FINAL  Final  Culture, blood (routine x 2)     Status: None (Preliminary result)   Collection Time: 01/22/15  7:08 PM  Result Value Ref Range Status   Specimen Description BLOOD RIGHT HAND  Final   Special Requests BOTTLES DRAWN AEROBIC ONLY 5CC  Final   Culture NO GROWTH 4 DAYS  Final   Report Status PENDING  Incomplete  Culture, blood (routine x 2)     Status: None (Preliminary result)   Collection Time: 01/24/15  3:56 AM  Result Value Ref Range Status    Specimen Description BLOOD RIGHT ARM  Final   Special Requests IN  PEDIATRIC BOTTLE 1CC  Final   Culture NO GROWTH 2 DAYS  Final   Report Status PENDING  Incomplete  Culture, blood (routine x 2)     Status: None (Preliminary result)   Collection Time: 01/24/15  4:12 AM  Result Value Ref Range Status   Specimen Description BLOOD RIGHT HAND  Final   Special Requests IN PEDIATRIC BOTTLE 1CC  Final   Culture  Setup Time   Final    GRAM POSITIVE COCCI IN CLUSTERS AEROBIC BOTTLE ONLY CRITICAL RESULT CALLED TO, READ BACK BY AND VERIFIED WITH: JEFF  01/25/15 MKELLY    Culture NO GROWTH 1 DAY  Final   Report Status PENDING  Incomplete  Culture, blood (routine x 2)     Status: None (Preliminary result)   Collection Time: 01/25/15  4:53 AM  Result Value Ref Range Status   Specimen Description BLOOD RIGHT HAND  Final   Special Requests   Final    BOTTLES DRAWN AEROBIC AND ANAEROBIC 3CC BLUE 5CC PURPLE   Culture NO GROWTH 1 DAY  Final   Report Status PENDING  Incomplete  Culture, blood (routine x 2)     Status: None (Preliminary result)   Collection Time: 01/25/15  5:04 AM  Result Value Ref Range Status   Specimen Description BLOOD RIGHT HAND  Final   Special Requests BOTTLES DRAWN AEROBIC AND ANAEROBIC 5CC  Final   Culture NO GROWTH 1 DAY  Final   Report Status PENDING  Incomplete   Studies/Results: No results found. Medications: I have reviewed the patient's current medications. Scheduled Meds: . albuterol  2.5 mg Nebulization TID  . antiseptic oral rinse  7 mL Mouth Rinse q12n4p  . baclofen  10 mg Per Tube TID  . cefUROXime  500 mg Per Tube BID WC  . chlorhexidine  15 mL Mouth Rinse BID  . enoxaparin (LOVENOX) injection  40 mg Subcutaneous Q24H  . feeding supplement (PRO-STAT SUGAR FREE 64)  30 mL Per Tube Daily  . free water  100 mL Per Tube Q4H  . HYDROcodone-acetaminophen  1 tablet Oral 2 times per day  . loratadine  10 mg Per Tube Daily  . meloxicam  7.5 mg Per Tube Q  breakfast  . multivitamin  5 mL Per Tube Daily  . pantoprazole sodium  40 mg Per Tube Daily  . polyethylene glycol  17 g Per Tube Daily  . rOPINIRole  0.25 mg Per Tube q1800  . sucralfate  1 g Per Tube 4 times per day  . vancomycin  1,000 mg Intravenous Q12H  . vitamin B-12  1,000 mcg Per Tube Daily   Continuous Infusions: . feeding supplement (JEVITY 1.2 CAL) 1,000 mL (01/24/15 1747)   PRN Meds:.bisacodyl, morphine CONCENTRATE Assessment/Plan: Principal Problem:   HCAP (healthcare-associated pneumonia) Active Problems:   Frontotemporal dementia   Dysphasia   Staphylococcus hominis sepsis (HCC)   UTI (urinary tract infection) due to Enterococcus  Bacteremia  Patient is presenting from a nursing home with cough and congestion for several days. Had a chest x-ray done several days ago that showed pneumonia and was given 1 dose of IM ceftriaxone at the nursing home. She was also admitted 3 months ago for pneumonia. CXR here on admission showing possible medial right lower lobe airspace disease. EKG showing sinus tachycardia (HR 120) and QTc 498. Repeat CXR showing L basilar atelectasis/ infiltrate but no convincing pulmonary edema. She is currently afebrile and leukocytosis has resolved. HR normal now but patient is still  tachypnic. Satting 98-100% on RA. Urine culture growing VRE. Blood culture from 11/19 (1 set) growing Staph hominis.     -Appreciate ID recs -Treat as possible endocarditis - Vanc x 6 weeks. If blood cx from 11/23 remain negative on 11/26, will possibly place picc line.  -Albuterol nebulizer -Supplemental O2 -Pending final blood cx.  Frontotemporal Dementia  7 year history. Patient became wheelchair bound in 2013 and has been requiring total care. Resides at [facility] nursing home. She has a feeding tube. -Norco 5-325 mg -Morphine 5 mg q4 prn SOB -Meloxicam 7.5 mg daily  -Ativan 1 mg daily  -Baclofen 10 mg TID -Ropinirole 0.25 mg daily  -Neuro checks    Hypokalemia Resolved - K normal (3.8) today.  - Replete as needed.  B12 deficiency  -Cyanocobalamin   Constipation -Dulcolax suppository  -Miralax   GI ppx -Nexium 40 mg daily  -Sucralfate   DVT ppx -Lovenox  Diet Patient has a G tube in place for enteral nutrition.  -Free water per tube q4 hrs  -Multivitamin liquid -Consult to dietician   Code: FULL   Dispo: Disposition is deferred at this time, awaiting improvement of current medical problems.  Anticipated discharge in approximately 2-3 day(s).   The patient does have a current PCP [name], MD) and does need an [facility] hospital follow-up appointment after discharge.  The patient does not have transportation limitations that hinder transportation to clinic appointments.  .Services Needed at time of discharge: Y = Yes, Blank = No PT:   OT:   RN:   Equipment:   Other:     LOS: 5 days   John Giovanni, MD 01/26/2015, 1:53 PM

## 2015-01-27 LAB — GLUCOSE, CAPILLARY
GLUCOSE-CAPILLARY: 106 mg/dL — AB (ref 65–99)
GLUCOSE-CAPILLARY: 113 mg/dL — AB (ref 65–99)
GLUCOSE-CAPILLARY: 113 mg/dL — AB (ref 65–99)
GLUCOSE-CAPILLARY: 139 mg/dL — AB (ref 65–99)
Glucose-Capillary: 103 mg/dL — ABNORMAL HIGH (ref 65–99)
Glucose-Capillary: 94 mg/dL (ref 65–99)

## 2015-01-27 LAB — CBC
HCT: 35.6 % — ABNORMAL LOW (ref 36.0–46.0)
Hemoglobin: 11.1 g/dL — ABNORMAL LOW (ref 12.0–15.0)
MCH: 27.4 pg (ref 26.0–34.0)
MCHC: 31.2 g/dL (ref 30.0–36.0)
MCV: 87.9 fL (ref 78.0–100.0)
PLATELETS: 276 10*3/uL (ref 150–400)
RBC: 4.05 MIL/uL (ref 3.87–5.11)
RDW: 14.8 % (ref 11.5–15.5)
WBC: 8.1 10*3/uL (ref 4.0–10.5)

## 2015-01-27 LAB — CULTURE, BLOOD (ROUTINE X 2): Culture: NO GROWTH

## 2015-01-27 LAB — BASIC METABOLIC PANEL
Anion gap: 5 (ref 5–15)
BUN: 7 mg/dL (ref 6–20)
CHLORIDE: 108 mmol/L (ref 101–111)
CO2: 26 mmol/L (ref 22–32)
CREATININE: 0.6 mg/dL (ref 0.44–1.00)
Calcium: 8.7 mg/dL — ABNORMAL LOW (ref 8.9–10.3)
GFR calc Af Amer: >60 mL/min (ref >60)
GFR calc non Af Amer: >60 mL/min (ref >60)
Glucose, Bld: 117 mg/dL — ABNORMAL HIGH (ref 65–99)
Potassium: 3.6 mmol/L (ref 3.5–5.1)
SODIUM: 139 mmol/L (ref 135–145)

## 2015-01-27 MED ORDER — CEFUROXIME AXETIL 500 MG PO TABS
500.0000 mg | ORAL_TABLET | Freq: Two times a day (BID) | ORAL | Status: AC
  Administered 2015-01-27: 500 mg
  Filled 2015-01-27: qty 1

## 2015-01-27 NOTE — Progress Notes (Signed)
Subjective: Patient was seen and examined at bedside. She is non-verbal and does not follow commands, which is her baseline.  Objective: Vital signs in last 24 hours: Filed Vitals:   01/26/15 2200 01/27/15 0248 01/27/15 0500 01/27/15 0708  BP: 121/91 103/79  115/78  Pulse: 87 78  80  Temp: 99.3 F (37.4 C) 98.6 F (37 C)  97.9 F (36.6 C)  TempSrc: Axillary Axillary  Axillary  Resp: Height:      Weight:   141 lb 8.6 oz (64.2 kg)   SpO2: 100% 100%  100%   Weight change: -3 lb 8.4 oz (-1.6 kg) No intake or output data in the 24 hours ending 01/27/15 9147 Physical Exam: Constitutional:  Decorticate posturing   Cardiovascular: Regular rate and rhythm. Intact distal pulses.  Pulmonary/Chest: CTAB. No rales or rhonchi appreciated.  Abdominal: Soft. Bowel sounds are normal. She exhibits no distension. There is no guarding.  PEG tube site appears clean and dry. Does not appear infected.  Musculoskeletal: She exhibits no edema.  Neurological:  Nonverbal Does not follow commands  Skin: Skin is warm and dry.   Lab Results: Basic Metabolic Panel:  Recent Labs Lab 01/24/15 0412 01/25/15 0453  01/26/15 0436 01/27/15 0249  NA 144  --   < > 139 139  K 3.3*  --   < > 3.8 3.6  CL 111  --   < > 107 108  CO2 25  --   < > 25 26  GLUCOSE 111*  --   < > 106* 117*  BUN 8  --   < > 8 7  CREATININE 0.66  --   < > 0.60 0.60  CALCIUM 8.3*  --   < > 8.7* 8.7*  MG 2.0 2.1  --   --   --   PHOS 4.2  --   --   --   --   < > = values in this interval not displayed. Liver Function Tests:  Recent Labs Lab 01/21/15 1555 01/22/15 0247 01/24/15 0412  AST 24 20  --   ALT 32 23  --   ALKPHOS 115 79  --   BILITOT 0.5 1.1  --   PROT 8.5* 5.8*  --   ALBUMIN 3.7 2.5* 2.7*   CBC:  Recent Labs Lab 01/22/15 0247 01/23/15 0335  01/26/15 0436 01/27/15 0249  WBC 16.7* 11.0*  < > 7.8 8.1  NEUTROABS 12.1* 6.5  --   --   --   HGB 11.3* 11.1*  < > 10.7* 11.1*  HCT 36.1 34.7*   < > 34.0* 35.6*  MCV 89.4 87.8  < > 87.2 87.9  PLT 178 208  < > 248 276  < > = values in this interval not displayed. Urinalysis:  Recent Labs Lab 01/21/15 1645  COLORURINE AMBER*  LABSPEC 1.045*  PHURINE 5.0  GLUCOSEU NEGATIVE  HGBUR LARGE*  BILIRUBINUR NEGATIVE  KETONESUR 15*  PROTEINUR >300*  NITRITE NEGATIVE  LEUKOCYTESUR SMALL*   Misc. Labs:   Micro Results: Recent Results (from the past 240 hour(s))  Culture, blood (routine x 2)     Status: None   Collection Time: 01/21/15  3:55 PM  Result Value Ref Range Status   Specimen Description BLOOD LEFT FOREARM  Final   Special Requests IN PEDIATRIC BOTTLE 1CC  Final   Culture NO GROWTH 5 DAYS  Final   Report Status 01/26/2015 FINAL  Final  Culture, blood (routine x  2)     Status: None   Collection Time: 01/21/15  4:00 PM  Result Value Ref Range Status   Specimen Description BLOOD RIGHT FOREARM  Final   Special Requests BOTTLES DRAWN AEROBIC AND ANAEROBIC 5CC  Final   Culture  Setup Time   Final    GRAM POSITIVE COCCI IN CLUSTERS IN BOTH AEROBIC AND ANAEROBIC BOTTLES CRITICAL RESULT CALLED TO, READ BACK BY AND VERIFIED WITH: R. MYRICK,RN AT 1315 ON [id number] BY S. YARBROUGH    Culture STAPHYLOCOCCUS HOMINIS  Final   Report Status 01/25/2015 FINAL  Final   Organism ID, Bacteria STAPHYLOCOCCUS HOMINIS  Final      Susceptibility   Staphylococcus hominis - MIC*    CIPROFLOXACIN >=8 RESISTANT Resistant     ERYTHROMYCIN <=0.25 SENSITIVE Sensitive     GENTAMICIN <=0.5 SENSITIVE Sensitive     OXACILLIN >=4 RESISTANT Resistant     TETRACYCLINE >=16 RESISTANT Resistant     VANCOMYCIN 1 SENSITIVE Sensitive     TRIMETH/SULFA 80 RESISTANT Resistant     CLINDAMYCIN <=0.25 SENSITIVE Sensitive     RIFAMPIN <=0.5 SENSITIVE Sensitive     Inducible Clindamycin NEGATIVE Sensitive     * STAPHYLOCOCCUS HOMINIS  Urine culture     Status: None   Collection Time: 01/21/15  4:45 PM  Result Value Ref Range Status   Specimen Description  URINE, CATHETERIZED  Final   Special Requests NONE  Final   Culture   Final    >=100,000 COLONIES/mL VANCOMYCIN RESISTANT ENTEROCOCCUS ISOLATED   Report Status 01/25/2015 FINAL  Final   Organism ID, Bacteria VANCOMYCIN RESISTANT ENTEROCOCCUS ISOLATED  Final      Susceptibility   Vancomycin resistant enterococcus isolated - MIC*    AMPICILLIN <=2 SENSITIVE Sensitive     LEVOFLOXACIN >=8 RESISTANT Resistant     NITROFURANTOIN <=16 SENSITIVE Sensitive     VANCOMYCIN >=32 RESISTANT Resistant     LINEZOLID 2 SENSITIVE Sensitive     * >=100,000 COLONIES/mL VANCOMYCIN RESISTANT ENTEROCOCCUS ISOLATED  MRSA PCR Screening     Status: None   Collection Time: 01/22/15 12:09 AM  Result Value Ref Range Status   MRSA by PCR NEGATIVE NEGATIVE Final    Comment:        The GeneXpert MRSA Assay (FDA approved for NASAL specimens only), is one component of a comprehensive MRSA colonization surveillance program. It is not intended to diagnose MRSA infection nor to guide or monitor treatment for MRSA infections.   Culture, blood (routine x 2)     Status: None   Collection Time: 01/22/15  6:57 PM  Result Value Ref Range Status   Specimen Description BLOOD RIGHT HAND  Final   Special Requests IN PEDIATRIC BOTTLE 3CC  Final   Culture  Setup Time   Final    GRAM POSITIVE COCCI IN CLUSTERS AEROBIC BOTTLE ONLY CRITICAL RESULT CALLED TO, READ BACK BY AND VERIFIED WITH: S DOTY 01/23/15 @ 1324 M VESTAL    Culture   Final    STAPHYLOCOCCUS HOMINIS SUSCEPTIBILITIES PERFORMED ON PREVIOUS CULTURE WITHIN THE LAST 5 DAYS.    Report Status 01/25/2015 FINAL  Final  Culture, blood (routine x 2)     Status: None (Preliminary result)   Collection Time: 01/22/15  7:08 PM  Result Value Ref Range Status   Specimen Description BLOOD RIGHT HAND  Final   Special Requests BOTTLES DRAWN AEROBIC ONLY 5CC  Final   Culture NO GROWTH 4 DAYS  Final   Report Status PENDING  Incomplete  Culture, blood (routine x 2)      Status: None (Preliminary result)   Collection Time: 01/24/15  3:56 AM  Result Value Ref Range Status   Specimen Description BLOOD RIGHT ARM  Final   Special Requests IN PEDIATRIC BOTTLE 1CC  Final   Culture NO GROWTH 2 DAYS  Final   Report Status PENDING  Incomplete  Culture, blood (routine x 2)     Status: None (Preliminary result)   Collection Time: 01/24/15  4:12 AM  Result Value Ref Range Status   Specimen Description BLOOD RIGHT HAND  Final   Special Requests IN PEDIATRIC BOTTLE 1CC  Final   Culture  Setup Time   Final    GRAM POSITIVE COCCI IN CLUSTERS AEROBIC BOTTLE ONLY CRITICAL RESULT CALLED TO, READ BACK BY AND VERIFIED WITH: JEFF @0613  01/25/15 MKELLY    Culture NO GROWTH 1 DAY  Final   Report Status PENDING  Incomplete  Culture, blood (routine x 2)     Status: None (Preliminary result)   Collection Time: 01/25/15  4:53 AM  Result Value Ref Range Status   Specimen Description BLOOD RIGHT HAND  Final   Special Requests   Final    BOTTLES DRAWN AEROBIC AND ANAEROBIC 3CC BLUE 5CC PURPLE   Culture NO GROWTH 1 DAY  Final   Report Status PENDING  Incomplete  Culture, blood (routine x 2)     Status: None (Preliminary result)   Collection Time: 01/25/15  5:04 AM  Result Value Ref Range Status   Specimen Description BLOOD RIGHT HAND  Final   Special Requests BOTTLES DRAWN AEROBIC AND ANAEROBIC 5CC  Final   Culture NO GROWTH 1 DAY  Final   Report Status PENDING  Incomplete   Studies/Results: No results found. Medications: I have reviewed the patient's current medications. Scheduled Meds: . albuterol  2.5 mg Nebulization TID  . antiseptic oral rinse  7 mL Mouth Rinse q12n4p  . baclofen  10 mg Per Tube TID  . cefUROXime  500 mg Per Tube BID WC  . chlorhexidine  15 mL Mouth Rinse BID  . enoxaparin (LOVENOX) injection  40 mg Subcutaneous Q24H  . feeding supplement (PRO-STAT SUGAR FREE 64)  30 mL Per Tube Daily  . free water  100 mL Per Tube Q4H  .  HYDROcodone-acetaminophen  1 tablet Oral 2 times per day  . loratadine  10 mg Per Tube Daily  . meloxicam  7.5 mg Per Tube Q breakfast  . multivitamin  5 mL Per Tube Daily  . pantoprazole sodium  40 mg Per Tube Daily  . polyethylene glycol  17 g Per Tube Daily  . rOPINIRole  0.25 mg Per Tube q1800  . sucralfate  1 g Per Tube 4 times per day  . vancomycin  1,000 mg Intravenous Q12H  . vitamin B-12  1,000 mcg Per Tube Daily   Continuous Infusions: . feeding supplement (JEVITY 1.2 CAL) 1,000 mL (01/26/15 2205)   PRN Meds:.bisacodyl, morphine CONCENTRATE Assessment/Plan: Principal Problem:   HCAP (healthcare-associated pneumonia) Active Problems:   Frontotemporal dementia   Dysphasia   Staphylococcus hominis sepsis (HCC)   UTI (urinary tract infection) due to Enterococcus  Bacteremia  Patient is presenting from a nursing home with cough and congestion for several days. Had a chest x-ray done several days ago that showed pneumonia and was given 1 dose of IM ceftriaxone at the nursing home. She was also admitted 3 months ago for pneumonia. CXR here on admission  showing possible medial right lower lobe airspace disease. EKG on admission showing sinus tachycardia (HR 120) and QTc 498. Repeat CXR showing L basilar atelectasis/ infiltrate but no convincing pulmonary edema. She is currently afebrile and leukocytosis has resolved. Vitals are normal. Satting 98-100% on RA. Urine culture growing VRE. Blood culture from 11/19 (1 set) growing Staph hominis. Blood culture from 11/23 showing no growth in 1 day.  -Appreciate ID recs -Treat as possible endocarditis - Vanc x 6 weeks. If blood cx from 11/23 remain negative on 11/26, will possibly place picc line.  -Albuterol nebulizer -Supplemental O2 -Pending final blood cx.  Frontotemporal Dementia  7 year history. Patient became wheelchair bound in 2013 and has been requiring total care. Resides at [facility]. She has a feeding tube. -Norco  5-325 mg -Morphine 5 mg q4 prn SOB -Meloxicam 7.5 mg daily  -Ativan 1 mg daily  -Baclofen 10 mg TID -Ropinirole 0.25 mg daily  -Neuro checks   Hypokalemia Resolved - K normal (3.8) today.  - Replete as needed.  B12 deficiency  -Cyanocobalamin   Constipation -Dulcolax suppository  -Miralax   GI ppx -Nexium 40 mg daily  -Sucralfate   DVT ppx -Lovenox  Diet Patient has a G tube in place for enteral nutrition.  -Free water per tube q4 hrs  -Multivitamin liquid -Consult to dietician   Code: FULL   Dispo: Disposition is deferred at this time, awaiting improvement of current medical problems.  Anticipated discharge in approximately 2-3 day(s).   The patient does have a current PCP [name], MD) and does need an [facility] hospital follow-up appointment after discharge.  The patient does not have transportation limitations that hinder transportation to clinic appointments.  .Services Needed at time of discharge: Y = Yes, Blank = No PT:   OT:   RN:   Equipment:   Other:     LOS: 6 days   John Giovanni, MD 01/27/2015, 7:22 AM

## 2015-01-28 LAB — BASIC METABOLIC PANEL
ANION GAP: 8 (ref 5–15)
BUN: 9 mg/dL (ref 6–20)
CALCIUM: 9 mg/dL (ref 8.9–10.3)
CHLORIDE: 107 mmol/L (ref 101–111)
CO2: 24 mmol/L (ref 22–32)
Creatinine, Ser: 0.62 mg/dL (ref 0.44–1.00)
GFR calc non Af Amer: >60 mL/min (ref >60)
Glucose, Bld: 115 mg/dL — ABNORMAL HIGH (ref 65–99)
Potassium: 4.5 mmol/L (ref 3.5–5.1)
SODIUM: 139 mmol/L (ref 135–145)

## 2015-01-28 LAB — CBC
HEMATOCRIT: 40.9 % (ref 36.0–46.0)
HEMOGLOBIN: 13.4 g/dL (ref 12.0–15.0)
MCH: 28.2 pg (ref 26.0–34.0)
MCHC: 32.8 g/dL (ref 30.0–36.0)
MCV: 86.1 fL (ref 78.0–100.0)
Platelets: 234 10*3/uL (ref 150–400)
RBC: 4.75 MIL/uL (ref 3.87–5.11)
RDW: 14.7 % (ref 11.5–15.5)
WBC: 8.3 10*3/uL (ref 4.0–10.5)

## 2015-01-28 LAB — GLUCOSE, CAPILLARY
GLUCOSE-CAPILLARY: 106 mg/dL — AB (ref 65–99)
GLUCOSE-CAPILLARY: 111 mg/dL — AB (ref 65–99)
GLUCOSE-CAPILLARY: 112 mg/dL — AB (ref 65–99)
GLUCOSE-CAPILLARY: 147 mg/dL — AB (ref 65–99)
Glucose-Capillary: 111 mg/dL — ABNORMAL HIGH (ref 65–99)
Glucose-Capillary: 111 mg/dL — ABNORMAL HIGH (ref 65–99)
Glucose-Capillary: 119 mg/dL — ABNORMAL HIGH (ref 65–99)

## 2015-01-28 LAB — VANCOMYCIN, TROUGH: VANCOMYCIN TR: 18 ug/mL (ref 10.0–20.0)

## 2015-01-28 MED ORDER — VANCOMYCIN HCL IN DEXTROSE 1-5 GM/200ML-% IV SOLN: 1000.0000 mg | Freq: Two times a day (BID) | INTRAVENOUS | Status: AC

## 2015-01-28 MED ORDER — MORPHINE SULFATE (CONCENTRATE) 20 MG/ML PO SOLN: 5.0000 mg | ORAL | Status: DC | PRN

## 2015-01-28 MED ORDER — VANCOMYCIN HCL IN DEXTROSE 1-5 GM/200ML-% IV SOLN: 1000.0000 mg | Freq: Two times a day (BID) | INTRAVENOUS | Status: DC

## 2015-01-28 NOTE — Progress Notes (Signed)
Subjective: Patient was seen and examined at bedside. No changes. Nonverbal, does not follow commands.   Objective: Vital signs in last 24 hours: Filed Vitals:   01/28/15 0134 01/28/15 0359 01/28/15 0601 01/28/15 0917  BP: 119/82  114/83 111/81  Pulse: 86  87 92  Temp: 97.9 F (36.6 C)  97.7 F (36.5 C) 99 F (37.2 C)  TempSrc: Axillary  Axillary Axillary  Resp: 17  18 18   Height:      Weight:  138 lb 10.7 oz (62.9 kg)    SpO2: 100%  100% 100%   Weight change: -2 lb 13.9 oz (-1.3 kg) No intake or output data in the 24 hours ending 01/28/15 1219 Physical Exam: Constitutional:  Decorticate posturing. NAD. Makes eye contact occasionally.  Cardiovascular: Regular rate and rhythm. Intact distal pulses.  Pulmonary/Chest: CTAB. No rales or rhonchi appreciated.  Abdominal: Soft. Bowel sounds are normal. She exhibits no distension. There is no guarding.  PEG tube site appears clean and dry. Does not appear infected.  Musculoskeletal: She exhibits no edema.  Neurological:  Nonverbal Does not follow commands  Skin: Skin is warm and dry.   Lab Results: Basic Metabolic Panel:  Recent Labs Lab 01/24/15 0412 01/25/15 0453  01/27/15 0249 01/28/15 0524  NA 144  --   < > 139 139  K 3.3*  --   < > 3.6 4.5  CL 111  --   < > 108 107  CO2 25  --   < > 26 24  GLUCOSE 111*  --   < > 117* 115*  BUN 8  --   < > 7 9  CREATININE 0.66  --   < > 0.60 0.62  CALCIUM 8.3*  --   < > 8.7* 9.0  MG 2.0 2.1  --   --   --   PHOS 4.2  --   --   --   --   < > = values in this interval not displayed. Liver Function Tests:  Recent Labs Lab 01/21/15 1555 01/22/15 0247 01/24/15 0412  AST 24 20  --   ALT 32 23  --   ALKPHOS 115 79  --   BILITOT 0.5 1.1  --   PROT 8.5* 5.8*  --   ALBUMIN 3.7 2.5* 2.7*   CBC:  Recent Labs Lab 01/22/15 0247 01/23/15 0335  01/27/15 0249 01/28/15 0524  WBC 16.7* 11.0*  < > 8.1 8.3  NEUTROABS 12.1* 6.5  --   --   --   HGB 11.3* 11.1*  < > 11.1* 13.4    HCT 36.1 34.7*  < > 35.6* 40.9  MCV 89.4 87.8  < > 87.9 86.1  PLT 178 208  < > 276 234  < > = values in this interval not displayed. Urinalysis:  Recent Labs Lab 01/21/15 1645  COLORURINE AMBER*  LABSPEC 1.045*  PHURINE 5.0  GLUCOSEU NEGATIVE  HGBUR LARGE*  BILIRUBINUR NEGATIVE  KETONESUR 15*  PROTEINUR >300*  NITRITE NEGATIVE  LEUKOCYTESUR SMALL*   Misc. Labs:   Micro Results: Recent Results (from the past 240 hour(s))  Culture, blood (routine x 2)     Status: None   Collection Time: 01/21/15  3:55 PM  Result Value Ref Range Status   Specimen Description BLOOD LEFT FOREARM  Final   Special Requests IN PEDIATRIC BOTTLE 1CC  Final   Culture NO GROWTH 5 DAYS  Final   Report Status 01/26/2015 FINAL  Final  Culture, blood (routine  x 2)     Status: None   Collection Time: 01/21/15  4:00 PM  Result Value Ref Range Status   Specimen Description BLOOD RIGHT FOREARM  Final   Special Requests BOTTLES DRAWN AEROBIC AND ANAEROBIC 5CC  Final   Culture  Setup Time   Final    GRAM POSITIVE COCCI IN CLUSTERS IN BOTH AEROBIC AND ANAEROBIC BOTTLES CRITICAL RESULT CALLED TO, READ BACK BY AND VERIFIED WITH: R. MYRICK,RN AT 1315 ON 112016 BY S. YARBROUGH    Culture STAPHYLOCOCCUS HOMINIS  Final   Report Status 01/25/2015 FINAL  Final   Organism ID, Bacteria STAPHYLOCOCCUS HOMINIS  Final      Susceptibility   Staphylococcus hominis - MIC*    CIPROFLOXACIN >=8 RESISTANT Resistant     ERYTHROMYCIN <=0.25 SENSITIVE Sensitive     GENTAMICIN <=0.5 SENSITIVE Sensitive     OXACILLIN >=4 RESISTANT Resistant     TETRACYCLINE >=16 RESISTANT Resistant     VANCOMYCIN 1 SENSITIVE Sensitive     TRIMETH/SULFA 80 RESISTANT Resistant     CLINDAMYCIN <=0.25 SENSITIVE Sensitive     RIFAMPIN <=0.5 SENSITIVE Sensitive     Inducible Clindamycin NEGATIVE Sensitive     * STAPHYLOCOCCUS HOMINIS  Urine culture     Status: None   Collection Time: 01/21/15  4:45 PM  Result Value Ref Range Status    Specimen Description URINE, CATHETERIZED  Final   Special Requests NONE  Final   Culture   Final    >=100,000 COLONIES/mL VANCOMYCIN RESISTANT ENTEROCOCCUS ISOLATED   Report Status 01/25/2015 FINAL  Final   Organism ID, Bacteria VANCOMYCIN RESISTANT ENTEROCOCCUS ISOLATED  Final      Susceptibility   Vancomycin resistant enterococcus isolated - MIC*    AMPICILLIN <=2 SENSITIVE Sensitive     LEVOFLOXACIN >=8 RESISTANT Resistant     NITROFURANTOIN <=16 SENSITIVE Sensitive     VANCOMYCIN >=32 RESISTANT Resistant     LINEZOLID 2 SENSITIVE Sensitive     * >=100,000 COLONIES/mL VANCOMYCIN RESISTANT ENTEROCOCCUS ISOLATED  MRSA PCR Screening     Status: None   Collection Time: 01/22/15 12:09 AM  Result Value Ref Range Status   MRSA by PCR NEGATIVE NEGATIVE Final    Comment:        The GeneXpert MRSA Assay (FDA approved for NASAL specimens only), is one component of a comprehensive MRSA colonization surveillance program. It is not intended to diagnose MRSA infection nor to guide or monitor treatment for MRSA infections.   Culture, blood (routine x 2)     Status: None   Collection Time: 01/22/15  6:57 PM  Result Value Ref Range Status   Specimen Description BLOOD RIGHT HAND  Final   Special Requests IN PEDIATRIC BOTTLE 3CC  Final   Culture  Setup Time   Final    GRAM POSITIVE COCCI IN CLUSTERS AEROBIC BOTTLE ONLY CRITICAL RESULT CALLED TO, READ BACK BY AND VERIFIED WITH: S DOTY 01/23/15 @ 1324 M VESTAL    Culture   Final    STAPHYLOCOCCUS HOMINIS SUSCEPTIBILITIES PERFORMED ON PREVIOUS CULTURE WITHIN THE LAST 5 DAYS.    Report Status 01/25/2015 FINAL  Final  Culture, blood (routine x 2)     Status: None   Collection Time: 01/22/15  7:08 PM  Result Value Ref Range Status   Specimen Description BLOOD RIGHT HAND  Final   Special Requests BOTTLES DRAWN AEROBIC ONLY 5CC  Final   Culture NO GROWTH 5 DAYS  Final   Report Status 01/27/2015 FINAL  Final  Culture, blood (routine x 2)      Status: None (Preliminary result)   Collection Time: 01/24/15  3:56 AM  Result Value Ref Range Status   Specimen Description BLOOD RIGHT ARM  Final   Special Requests IN PEDIATRIC BOTTLE 1CC  Final   Culture NO GROWTH 3 DAYS  Final   Report Status PENDING  Incomplete  Culture, blood (routine x 2)     Status: None   Collection Time: 01/24/15  4:12 AM  Result Value Ref Range Status   Specimen Description BLOOD RIGHT HAND  Final   Special Requests IN PEDIATRIC BOTTLE 1CC  Final   Culture  Setup Time   Final    GRAM POSITIVE COCCI IN CLUSTERS AEROBIC BOTTLE ONLY CRITICAL RESULT CALLED TO, READ BACK BY AND VERIFIED WITH: JEFF  01/25/15 MKELLY    Culture   Final    STAPHYLOCOCCUS SPECIES (COAGULASE NEGATIVE) SUSCEPTIBILITIES PERFORMED ON PREVIOUS CULTURE WITHIN THE LAST 5 DAYS.    Report Status 01/27/2015 FINAL  Final  Culture, blood (routine x 2)     Status: None (Preliminary result)   Collection Time: 01/25/15  4:53 AM  Result Value Ref Range Status   Specimen Description BLOOD RIGHT HAND  Final   Special Requests   Final    BOTTLES DRAWN AEROBIC AND ANAEROBIC 3CC BLUE 5CC PURPLE   Culture NO GROWTH 2 DAYS  Final   Report Status PENDING  Incomplete  Culture, blood (routine x 2)     Status: None (Preliminary result)   Collection Time: 01/25/15  5:04 AM  Result Value Ref Range Status   Specimen Description BLOOD RIGHT HAND  Final   Special Requests BOTTLES DRAWN AEROBIC AND ANAEROBIC 5CC  Final   Culture NO GROWTH 2 DAYS  Final   Report Status PENDING  Incomplete   Studies/Results: No results found. Medications: I have reviewed the patient's current medications. Scheduled Meds: . albuterol  2.5 mg Nebulization TID  . antiseptic oral rinse  7 mL Mouth Rinse q12n4p  . baclofen  10 mg Per Tube TID  . chlorhexidine  15 mL Mouth Rinse BID  . enoxaparin (LOVENOX) injection  40 mg Subcutaneous Q24H  . feeding supplement (PRO-STAT SUGAR FREE 64)  30 mL Per Tube Daily  . free  water  100 mL Per Tube Q4H  . HYDROcodone-acetaminophen  1 tablet Oral 2 times per day  . loratadine  10 mg Per Tube Daily  . meloxicam  7.5 mg Per Tube Q breakfast  . multivitamin  5 mL Per Tube Daily  . pantoprazole sodium  40 mg Per Tube Daily  . polyethylene glycol  17 g Per Tube Daily  . rOPINIRole  0.25 mg Per Tube q1800  . sucralfate  1 g Per Tube 4 times per day  . vancomycin  1,000 mg Intravenous Q12H  . vitamin B-12  1,000 mcg Per Tube Daily   Continuous Infusions: . 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 unclear source as she does not have any skin breakdown.  She had persistent coag neg staph in subsequent bcx's on 11/19, 11/20, and 11/22 while on vancomycin since admission.  Her bcx from 11/23 has remained negative for 72 hours.  -Appreciate ID recs. Treat as possible endocarditis - Vanc x 6 weeks from 01/25/15 to 03/08/2015. placcing PICC line 11/26 before discharge on IV abx.  - will need Vanc trough checked at the nursing facility with adjustment of vanc dosing as appropriate.   HCAP with Staph Hominis Bacteremia  Presented from nursing facility with some increased cough. Initial CXR concerning for RLL infiltrate, but repeat cxr did not show the infiltrate. She seems to be improving al though its hard to assess improvement with her severe dementia. She is at risk of aspiration with PEG tube dependence. he was also admitted 3 months ago for pneumonia. She remained afebrile here. We finished treatment for HCAP with vanc+ zosyn (then ceftin) for total 7 days.  Frontotemporal  Dementia  7 year history. Patient became wheelchair bound in 2013 and has been requiring total care. Resides at [facility] nursing home. She has a feeding tube. -Norco 5-325 mg -Morphine 5 mg q4 prn SOB -Meloxicam 7.5 mg daily  -Ativan 1 mg daily  -Baclofen 10 mg TID -Ropinirole 0.25 mg daily  -Neuro checks   Hypokalemia Resolved - K normal (3.8) today.  - Repleted as needed.  B12 deficiency  -Cyanocobalamin   Constipation -Dulcolax suppository  -Miralax   GI ppx -Nexium 40 mg daily  -Sucralfate   DVT ppx -Lovenox  Diet Patient has a G tube in place for enteral nutrition.  -Free water per tube q4 hrs  -Multivitamin liquid -Consult to dietician   Code: FULL   Dispo: back to nursing facility today.  The patient does have a current PCP [name], MD) and does need an [facility] hospital follow-up appointment after discharge.  The patient does not have transportation limitations that hinder transportation to clinic appointments.  .Services Needed at time of discharge: Y = Yes, Blank = No PT:   OT:   RN:   Equipment:   Other:     LOS: 7 days   Hyacinth Meeker, MD 01/28/2015, 12:19 PM

## 2015-01-28 NOTE — [place] FL2 (Signed)
[place] MEDICAID FL2 LEVEL OF CARE SCREENING TOOL     IDENTIFICATION  Patient Name: [name] [name] Birthdate: 20-Aug-1970 Sex: female Admission Date (Current Location): 01/21/2015  [place] and [place] Number: Haynes Bast [id number] Facility and Address:  [facility] [place], [address] [place], [place] [address]      Provider Number: [id number]  Attending Physician Name and Address:  Levert Feinstein, MD  Relative Name and Phone Number:       Current Level of Care: Hospital Recommended Level of Care: Nursing Facility Prior Approval Number:    Date Approved/Denied:   PASRR Number: [id number]  Discharge Plan: SNF    Current Diagnoses: Patient Active Problem List   Diagnosis Date Noted  . Staphylococcus hominis sepsis (HCC) 01/24/2015  . UTI (urinary tract infection) due to Enterococcus 01/24/2015  . Severe sepsis (HCC)   . Bacteremia 10/19/2013  . Wheezes 10/19/2013  . UTI (lower urinary tract infection) 10/17/2013  . Sinus tachycardia (HCC) 10/17/2013  . Dysphagia 10/17/2013  . PEG tube malfunction (HCC) 10/17/2013  . HCAP (healthcare-associated pneumonia) 03/12/2013  . SIRS (systemic inflammatory response syndrome) (HCC) 03/12/2013  . Hyperkalemia 03/12/2013  . Healthcare associated bacterial pneumonia 03/12/2013  . Frontotemporal dementia 02/07/2013  . Dysphasia 02/07/2013  . Constipation 02/07/2013    Orientation ACTIVITIES/SOCIAL BLADDER RESPIRATION    Self  Passive   Normal  BEHAVIORAL SYMPTOMS/MOOD NEUROLOGICAL BOWEL NUTRITION STATUS        Feeding tube  PHYSICIAN VISITS COMMUNICATION OF NEEDS Height & Weight Skin  30 days Does not communicate  (160 cm) 138 lbs. Normal          AMBULATORY STATUS RESPIRATION    Assist extensive Normal      Personal Care Assistance Level of Assistance  Total care, Feeding   Feeding assistance: Maximum assistance   Total Care Assistance: Maximum assistance    Functional  Limitations Info                SPECIAL CARE FACTORS FREQUENCY                      Additional Factors Info  Code Status Code Status Info: Full Code             Current Medications (01/28/2015): Current Facility-Administered Medications  Medication Dose Route Frequency Provider Last Rate Last Dose  . albuterol (PROVENTIL) (2.5 MG/3ML) 0.083% nebulizer solution 2.5 mg  2.5 mg Nebulization TID Tyson Alias, MD   2.5 mg at 01/28/15 0747  . antiseptic oral rinse (CPC / CETYLPYRIDINIUM CHLORIDE 0.05%) solution 7 mL  7 mL Mouth Rinse q12n4p Tyson Alias, MD   7 mL at 01/28/15 1200  . baclofen (LIORESAL) tablet 10 mg  10 mg Per Tube TID Hyacinth Meeker, MD   10 mg at 01/28/15 1047  . bisacodyl (DULCOLAX) suppository 10 mg  10 mg Rectal Q48H PRN Hyacinth Meeker, MD   10 mg at 01/25/15 1446  . chlorhexidine (PERIDEX) 0.12 % solution 15 mL  15 mL Mouth Rinse BID Tyson Alias, MD   15 mL at 01/28/15 1048  . enoxaparin (LOVENOX) injection 40 mg  40 mg Subcutaneous Q24H Tasrif Ahmed, MD   40 mg at 01/27/15 2108  . feeding supplement (JEVITY 1.2 CAL) liquid 1,000 mL  1,000 mL Per Tube Continuous Tilda Franco, RD 50 mL/hr at 01/27/15 2108 1,000 mL at 01/27/15 2108  . feeding supplement (PRO-STAT SUGAR FREE 64) liquid 30 mL  30  mL Per Tube Daily Idell Pickles, RD   30 mL at 01/28/15 1047  . free water 100 mL  100 mL Per Tube Q4H Idell Pickles, RD   100 mL at 01/28/15 1105  . HYDROcodone-acetaminophen (NORCO/VICODIN) 5-325 MG per tablet 1 tablet  1 tablet Oral 2 times per day Tyson Alias, MD   1 tablet at 01/28/15 1056  . loratadine (CLARITIN) tablet 10 mg  10 mg Per Tube Daily Tyson Alias, MD   10 mg at 01/28/15 1047  . meloxicam (MOBIC) tablet 7.5 mg  7.5 mg Per Tube Q breakfast Tasrif Ahmed, MD   7.5 mg at 01/28/15 0611  . morphine CONCENTRATE 10 MG/0.5ML oral solution 5 mg  5 mg Oral Q4H PRN Tyson Alias, MD   5 mg at 01/28/15  0353  . multivitamin liquid 5 mL  5 mL Per Tube Daily Tasrif Ahmed, MD   5 mL at 01/27/15 1045  . pantoprazole sodium (PROTONIX) 40 mg/20 mL oral suspension 40 mg  40 mg Per Tube Daily Tyson Alias, MD   40 mg at 01/28/15 1056  . polyethylene glycol (MIRALAX / GLYCOLAX) packet 17 g  17 g Per Tube Daily Tasrif Ahmed, MD   17 g at 01/28/15 1045  . rOPINIRole (REQUIP) tablet 0.25 mg  0.25 mg Per Tube q1800 Tasrif Ahmed, MD   0.25 mg at 01/27/15 1803  . sucralfate (CARAFATE) 1 GM/10ML suspension 1 g  1 g Per Tube 4 times per day Hyacinth Meeker, MD   1 g at 01/27/15 2029  . vancomycin (VANCOCIN) IVPB 1000 mg/200 mL premix  1,000 mg Intravenous Q12H Levert Feinstein, MD   1,000 mg at 01/28/15 0341  . vitamin B-12 (CYANOCOBALAMIN) tablet 1,000 mcg  1,000 mcg Per Tube Daily Tasrif Ahmed, MD   1,000 mcg at 01/28/15 1047   Do not use this list as official medication orders. Please verify with discharge summary.  Discharge Medications:   Medication List    STOP taking these medications        HYDROcodone-acetaminophen 5-325 MG tablet  Commonly known as:  NORCO/VICODIN     ROCEPHIN 1 G injection  Generic drug:  cefTRIAXone      TAKE these medications        albuterol (2.5 MG/3ML) 0.083% nebulizer solution  Commonly known as:  PROVENTIL  Take 3 mLs (2.5 mg total) by nebulization 4 (four) times daily.     baclofen 10 MG tablet  Commonly known as:  LIORESAL  10 mg by PEG Tube route 3 (three) times daily. 9am, 2pm, 9pm     bisacodyl 10 MG suppository  Commonly known as:  DULCOLAX  Place 10 mg rectally every other day as needed for moderate constipation.     cetirizine 10 MG tablet  Commonly known as:  ZYRTEC  10 mg by PEG Tube route daily.     esomeprazole 40 MG packet  Commonly known as:  NEXIUM  40 mg by PEG Tube route daily.     FLORASTOR PO  1 capsule by PEG Tube route daily.     free water Soln  175 mLs by PEG Tube route every 4 (four) hours.     guaiFENesin 100  MG/5ML Soln  Commonly known as:  ROBITUSSIN  400 mg by PEG Tube route every 6 (six) hours as needed (congestion).     LORazepam 1 MG tablet  Commonly known as:  ATIVAN  1 mg by PEG Tube  route See admin instructions. Give 1 tablet (1 mg) via peg tube 30 minutes prior to dental appointment     meloxicam 7.5 MG tablet  Commonly known as:  MOBIC  7.5 mg by PEG Tube route daily.     morphine 20 MG/ML concentrated solution  Commonly known as:  ROXANOL  Place 0.25 mLs (5 mg total) into feeding tube every 2 (two) hours as needed (pain/ shortness of breath/ air hunger).     multivitamin Liqd  5 mLs by PEG Tube route daily.     polyethylene glycol packet  Commonly known as:  MIRALAX / GLYCOLAX  17 g by PEG Tube route daily.     PRESCRIPTION MEDICATION  by PEG Tube route continuous. Enteral Feed order Two Cal HN 30 ml/hr     rOPINIRole 0.25 MG tablet  Commonly known as:  REQUIP  0.25 mg by PEG Tube route daily at 6 PM.     sucralfate 1 GM/10ML suspension  Commonly known as:  CARAFATE  1 g by PEG Tube route 4 (four) times daily. 9am, 2pm, 4pm, 8pm     vancomycin 1 GM/200ML Soln  Commonly known as:  VANCOCIN  Inject 200 mLs (1,000 mg total) into the vein every 12 (twelve) hours.     cyanocobalamin 500 MCG tablet  500 mcg by PEG Tube route daily.     vitamin B-12 1000 MCG tablet  Commonly known as:  CYANOCOBALAMIN  1,000 mcg by PEG Tube route daily.     Vitamin D-3 5000 UNITS Tabs  5,000 Units by PEG Tube route 2 (two) times a week. On Monday and Saturday        Relevant Imaging Results:  Relevant Lab Results:  Recent Labs    Additional Information    Nagee Goates, Harrel LemonYWAN J, LCSW

## 2015-01-28 NOTE — Progress Notes (Addendum)
Order for IR central line placed by MD. Posey ReaUnsure if patient will tolerate placement, per MD, discontinue tube feedings at midnight 01/29/15 for IR. MD notified that per IR, patient unable to have procedure today. IR tech to speak with IR MD regarding scheduling for tomorrow (Sunday,) central line placement.   Addendum: Patient is awaiting central line placement for discharge. Discharge held for placement per MD. [facility].

## 2015-01-28 NOTE — Progress Notes (Signed)
[facility] for Infectious Disease    Date of Admission:  01/21/2015   Total days of antibiotics 8        Day 8 vancomycin        -finished hcap tx-            ID: [name] [name] is a 44 y.o. 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 albuterol  2.5 mg Nebulization TID  . antiseptic oral rinse  7 mL Mouth Rinse q12n4p  . baclofen  10 mg Per Tube TID  . chlorhexidine  15 mL Mouth Rinse BID  . enoxaparin (LOVENOX) injection  40 mg Subcutaneous Q24H  . feeding supplement (PRO-STAT SUGAR FREE 64)  30 mL Per Tube Daily  . free water  100 mL Per Tube Q4H  . HYDROcodone-acetaminophen  1 tablet Oral 2 times per day  . loratadine  10 mg Per Tube Daily  . meloxicam  7.5 mg Per Tube Q breakfast  . multivitamin  5 mL Per Tube Daily  . pantoprazole sodium  40 mg Per Tube Daily  . polyethylene glycol  17 g Per Tube Daily  . rOPINIRole  0.25 mg Per Tube q1800  . sucralfate  1 g Per Tube 4 times per day  . vancomycin  1,000 mg Intravenous Q12H  . vitamin B-12  1,000 mcg Per Tube Daily    Objective: Vital signs in last 24 hours: Temp:  [97.7 F (36.5 C)-99 F (37.2 C)] 99 F (37.2 C) (11/26 0917) Pulse Rate:  [86-92] 92 (11/26 0917) Resp:  [17-20] 18 (11/26 0917) BP: (111-134)/(79-86) 111/81 mmHg (11/26 0917) SpO2:  [99 %-100 %] 100 % (11/26 0917) Weight:  [138 lb 10.7 oz (62.9 kg)] 138 lb 10.7 oz (62.9 kg) (11/26 0359) Physical Exam  Constitutional:  Eyes open to verbal stimuli. Moans occasionally Laying in bed. No distress.  Cardiovascular: Normal rate, regular rhythm and normal heart sounds. Exam reveals no gallop and no friction rub.  No murmur  heard.  Pulmonary/Chest: Effort normal and breath sounds normal. No respiratory distress.  has no wheezes.  Neck = supple, no nuchal rigidity Abdominal: Soft. Bowel sounds are normal.  exhibits mild  distension. There is no tenderness. PEG in place Neurological: does not answer questions, extremities contracted   Lab Results  Recent Labs  01/27/15 0249 01/28/15 0524  WBC 8.1 8.3  HGB 11.1* 13.4  HCT 35.6* 40.9  NA 139 139  K 3.6 4.5  CL 108 107  CO2 26 24  BUN 7 9  CREATININE 0.60 0.62   Liver Panel No results for input(s): PROT, ALBUMIN, AST, ALT, ALKPHOS, BILITOT, BILIDIR, IBILI in the last 72 hours.  Microbiology: 11/22 blood cx 1 of 4 bottles [facility] 11/23 blood cx NGTD at 28 hr  Assessment/Plan: S.hominis bacteremia = noted from blood cx from 11/18 thru 11/22, concern for NVE. Will treat as possible endocarditis with 6 wk of vancomycin. Use 11/23 as day 1 of 42. Will plan to get picc line today  HCAP = completed total 7 days course  FTD, miniminal consciousness = appears at her baseline. Previously when her symptoms started it appeared that she was considered to have CJD  but since then clinical picture is not c/w CJD, thus does not have this diagnosis as cause of her advanced neurodegenerative disease, per neurology notes from [facility] neurolgoy.   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 Sulfa Antibiotics Other (See Comments)    Per [facility]    Discharge antibiotics:  vancomycin Per pharmacy protocol , trough goal of 15-20. Duration: 6 wk End Date: Jan 4th, 2017  [facility] Care Per Protocol: Labs weekly while on IV antibiotics: _X CBC with differential _X CMP __ CRP __ ESR X   Vancomycin trough (15-20), adjust dosing and interval per protocol  Fax weekly labs to [phone]     [facility], [facility] for Infectious Diseases Cell: [phone] Pager:  [phone]  01/28/2015, 9:47 AM

## 2015-01-28 NOTE — Discharge Summary (Signed)
Name: [name] [name] MRN: [id number] DOB: [birth year] 44 y.o. PCP: [name], MD  Date of Admission: 01/21/2015  3:19 PM Date of Discharge: 01/28/2015 Attending Physician: Levert Feinstein, MD  Discharge Diagnosis:  Principal Problem:   HCAP (healthcare-associated pneumonia) Active Problems:   Frontotemporal dementia   Dysphasia   Staphylococcus hominis sepsis (HCC)   UTI (urinary tract infection) due to Enterococcus  Discharge Medications:   Medication List    STOP taking these medications        HYDROcodone-acetaminophen 5-325 MG tablet  Commonly known as:  NORCO/VICODIN     ROCEPHIN 1 G injection  Generic drug:  cefTRIAXone      TAKE these medications        albuterol (2.5 MG/3ML) 0.083% nebulizer solution  Commonly known as:  PROVENTIL  Take 3 mLs (2.5 mg total) by nebulization 4 (four) times daily.     baclofen 10 MG tablet  Commonly known as:  LIORESAL  10 mg by PEG Tube route 3 (three) times daily. 9am, 2pm, 9pm     bisacodyl 10 MG suppository  Commonly known as:  DULCOLAX  Place 10 mg rectally every other day as needed for moderate constipation.     cetirizine 10 MG tablet  Commonly known as:  ZYRTEC  10 mg by PEG Tube route daily.     esomeprazole 40 MG packet  Commonly known as:  NEXIUM  40 mg by PEG Tube route daily.     FLORASTOR PO  1 capsule by PEG Tube route daily.     free water Soln  175 mLs by PEG Tube route every 4 (four) hours.     guaiFENesin 100 MG/5ML Soln  Commonly known as:  ROBITUSSIN  400 mg by PEG Tube route every 6 (six) hours as needed (congestion).     LORazepam 1 MG tablet  Commonly known as:  ATIVAN  1 mg by PEG Tube route See admin instructions. Give 1 tablet (1 mg) via peg tube 30 minutes prior to dental appointment     meloxicam 7.5 MG tablet  Commonly known as:  MOBIC  7.5 mg by PEG Tube route daily.     morphine 20 MG/ML concentrated solution  Commonly known as:  ROXANOL  Place 0.25 mLs (5 mg total)  into feeding tube every 2 (two) hours as needed (pain/ shortness of breath/ air hunger).     multivitamin Liqd  5 mLs by PEG Tube route daily.     polyethylene glycol packet  Commonly known as:  MIRALAX / GLYCOLAX  17 g by PEG Tube route daily.     PRESCRIPTION MEDICATION  by PEG Tube route continuous. Enteral Feed order Two Cal HN 30 ml/hr     rOPINIRole 0.25 MG tablet  Commonly known as:  REQUIP  0.25 mg by PEG Tube route daily at 6 PM.     sucralfate 1 GM/10ML suspension  Commonly known as:  CARAFATE  1 g by PEG Tube route 4 (four) times daily. 9am, 2pm, 4pm, 8pm     vancomycin 1 GM/200ML Soln  Commonly known as:  VANCOCIN  Inject 200 mLs (1,000 mg total) into the vein every 12 (twelve) hours.     cyanocobalamin 500 MCG tablet  500 mcg by PEG Tube route daily.     vitamin B-12 1000 MCG tablet  Commonly known as:  CYANOCOBALAMIN  1,000 mcg by PEG Tube route daily.     Vitamin D-3 5000 UNITS Tabs  5,000 Units by PEG  Tube route 2 (two) times a week. On Monday and Saturday        Disposition and follow-up:   [name] [name] [facility] [place] [facility].  At the hospital follow up visit please address:  1.  Please make sure she finishes her 6 weeks vancomycin course from 01/25/15 to 03/08/2015. Please check vanc trough weekly with goal of 15-20 and adjust dosing and interval as appropriate.   Please Fax weekly labs to Dr. Drue SecondSnider at  [phone]  2.  Labs / imaging needed at time of follow-up: CMP and CBC weekly. Vanc trough weekly.   3.  Pending labs/ test needing follow-up: none.   Follow-up Appointments: Follow-up Information    Schedule an appointment as soon as possible for a visit with [name], SAAD, MD.   Specialty:  Internal Medicine   Contact information:   [address] [place] [address] [phone]       Discharge Instructions:   Consultations:    Procedures Performed:  Dg Chest Port 1  View  01/23/2015  CLINICAL DATA:  Pneumonia EXAM: PORTABLE CHEST 1 VIEW COMPARISON:  01/21/2015 FINDINGS: Cardiomediastinal silhouette is stable. Central mild vascular congestion without convincing pulmonary edema. Mild elevation of the left hemidiaphragm again noted. Left basilar atelectasis or infiltrate. IMPRESSION: No convincing pulmonary edema. Left basilar atelectasis or infiltrate. Electronically Signed   By: Natasha MeadLiviu  Pop M.D.   On: 01/23/2015 07:57   Dg Chest Port 1 View  01/21/2015  CLINICAL DATA:  Cough and fever.  Dementia.  Unresponsive. EXAM: PORTABLE CHEST 1 VIEW COMPARISON:  10/17/2013 FINDINGS: Patient moderately rotated to the right. Normal heart size. No pleural effusion or pneumothorax. Low lung volumes with resultant pulmonary interstitial prominence. Mild left base atelectasis. Cannot exclude right lower lobe airspace disease medially. IMPRESSION: Suboptimal patient positioning, including moderate obliquity. Possible medial right lower lobe airspace disease. Consider rib repeat radiograph if patient is able to be placed less oblique (as on the 10/17/2013 exam). Electronically Signed   By: Jeronimo GreavesKyle  Talbot M.D.   On: 01/21/2015 17:05    Admission HPI:   44 y.o. female with a past medical hx of dementia, Lenise ArenaCreutzfeldt Jakob disease, here presenting with pneumonia. Patient actually resides in the nursing home and is nonverbal at baseline. Patient actually has a PEG tube. Has been coughing and congestion for several days. Had a chest x-ray done several days ago that showed pneumonia and was given 1 dose of IM ceftriaxone. The chest x-ray result came back today and family visited her today and brought her here for evaluation. Patient is unable to give any history at all. Patient was admitted 3 months ago for pneumonia. Note: Patient is non-verbal. History above obtained from ED provider note. We tried calling her HPOA (sister) but could no reach her over the phone.   As per [facility], patient was doing fairly well until 2009. At that time she developed some psychiatric symptoms and psychosis. She subsequently developed concentration memory problems and motor symptoms. She also had insomnia and significant muscle jerking. Symptoms progressed rapidly at that time. Patient became wheelchair bound in 2013 and has been requiring total care. No history of head trauma. [facility]. She has a feeding tube.   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 unclear source as she does not have any skin breakdown.  She had persistent coag neg staph in subsequent bcx's on 11/19, 11/20, and 11/22 while on vancomycin since admission. Her bcx from 11/23 has remained negative for 72 hours.  -Appreciate ID recs. Treat as possible endocarditis - Vanc x 6 weeks from 01/25/15 to 03/08/2015. placcing PICC line 11/26 before discharge on IV abx.  - will need Vanc trough checked at the nursing facility with adjustment of vanc dosing as appropriate.   HCA Presented from nursing facility with some increased cough. Initial CXR concerning for RLL infiltrate, but repeat cxr did not show the infiltrate. She seems to be improving al though its hard to assess improvement with her severe dementia. She is at risk of aspiration with PEG tube dependence. he was also admitted 3 months ago for pneumonia. She remained afebrile here. We finished treatment for HCAP with vanc+ zosyn (then ceftin) for total 7 days.  UCX growing VRE - likely colonization.  - we did not treat this as patient cannot tell us about Urinary symptoms and she was improving with vancomycin. We  did discuss this with ID and they agreed with this plan.  Frontotemporal Dementia  7 year history. Patient became wheelchair bound in 2013 and has been requiring total care. Resides at [facility] nursing home. She has a feeding tube. We basically continued her outpatient medications.  -Norco 5-325 mg -Morphine 5 mg q4 prn SOB -Meloxicam 7.5 mg daily  -Ativan 1 mg daily  -Baclofen 10 mg TID -Ropinirole 0.25 mg daily  -Neuro checks   Hypokalemia Resolved - K normal (3.8) today.  - Repleted as needed.  B12 deficiency  -Cyanocobalamin   Constipation -Dulcolax suppository  -Miralax   GI ppx -Nexium 40 mg daily  -Sucralfate    Diet Patient has a G tube in place for enteral nutrition.  -Free water per tube q4 hrs  -Multivitamin liquid  Code: FULL   Discharge Vitals:   BP 111/81 mmHg  Pulse 92  Temp(Src) 99 F (37.2 C) (Axillary)  Resp 18  Ht  (1.6 m)  Wt 138 lb 10.7 oz (62.9 kg)  BMI 24.57 kg/m2  SpO2 100%  Discharge Labs:  Results for orders placed or performed during the hospital encounter of 01/21/15 (from the past 24 hour(s))  Glucose, capillary     Status: None   Collection Time: 01/27/15  5:07 PM  Result Value Ref Range   Glucose-Capillary 94 65 - 99 mg/dL   Comment 1 Notify RN    Comment 2 Document in Chart   Glucose, capillary     Status: Abnormal   Collection Time: 01/27/15  8:13 PM  Result Value Ref Range   Glucose-Capillary 113 (H) 65 - 99 mg/dL   Comment 1 Notify RN    Comment 2 Document in Chart   Glucose, capillary     Status: Abnormal   Collection Time: 01/27/15 11:57 PM  Result Value Ref Range   Glucose-Capillary 111 (H) 65 - 99 mg/dL   Comment 1 Notify RN    Comment 2 Document in Chart   Glucose, capillary     Status: Abnormal   Collection Time: 01/28/15  4:05 AM  Result Value Ref Range   Glucose-Capillary 111 (H) 65 - 99 mg/dL   Comment 1 Notify RN    Comment 2  Document in Chart   CBC     Status: None   Collection Time:  01/28/15  5:24 AM  Result Value Ref Range   WBC 8.3 4.0 - 10.5 K/uL   RBC 4.75 3.87 - 5.11 MIL/uL   Hemoglobin 13.4 12.0 - 15.0 g/dL   HCT 16.1 09.6 - 04.5 %   MCV 86.1 78.0 - 100.0 fL   MCH 28.2 26.0 - 34.0 pg   MCHC 32.8 30.0 - 36.0 g/dL   RDW 40.9 81.1 - 91.4 %   Platelets 234 150 - 400 K/uL  Basic metabolic panel     Status: Abnormal   Collection Time: 01/28/15  5:24 AM  Result Value Ref Range   Sodium 139 135 - 145 mmol/L   Potassium 4.5 3.5 - 5.1 mmol/L   Chloride 107 101 - 111 mmol/L   CO2 24 22 - 32 mmol/L   Glucose, Bld 115 (H) 65 - 99 mg/dL   BUN 9 6 - 20 mg/dL   Creatinine, Ser 7.82 0.44 - 1.00 mg/dL   Calcium 9.0 8.9 - 95.6 mg/dL   GFR calc non Af Amer >60 >60 mL/min   GFR calc Af Amer >60 >60 mL/min   Anion gap 8 5 - 15  Glucose, capillary     Status: Abnormal   Collection Time: 01/28/15  7:39 AM  Result Value Ref Range   Glucose-Capillary 119 (H) 65 - 99 mg/dL   Comment 1 Notify RN    Comment 2 Document in Chart   Glucose, capillary     Status: Abnormal   Collection Time: 01/28/15 11:44 AM  Result Value Ref Range   Glucose-Capillary 106 (H) 65 - 99 mg/dL    Signed: Hyacinth Meeker, MD 01/28/2015, 12:41 PM

## 2015-01-28 NOTE — Progress Notes (Signed)
ANTIBIOTIC CONSULT NOTE - FOLLOW UP  Pharmacy Consult for vancomycin Indication: bacteremia  Allergies  Allergen Reactions  . Sulfa Antibiotics Other (See Comments)    Per [facility]    Patient Measurements: Height: 5\' 3"  (160 cm) Weight: 138 lb 10.7 oz (62.9 kg) IBW/kg (Calculated) : 52.4 Adjusted Body Weight:   Vital Signs: Temp: 96.8 F (36 C) (11/26 1325) Temp Source: Axillary (11/26 1325) BP: 105/79 mmHg (11/26 1325) Pulse Rate: 95 (11/26 1325) Intake/Output from previous day:   Intake/Output from this shift:    Labs:  Recent Labs  01/26/15 0436 01/27/15 0249 01/28/15 0524  WBC 7.8 8.1 8.3  HGB 10.7* 11.1* 13.4  PLT 248 276 234  CREATININE 0.60 0.60 0.62   Estimated Creatinine Clearance: 79.3 mL/min (by C-G formula based on Cr of 0.62).  Recent Labs  01/28/15 1505  VANCOTROUGH 18     Microbiology: Recent Results (from the past 720 hour(s))  Culture, blood (routine x 2)     Status: None   Collection Time: 01/21/15  3:55 PM  Result Value Ref Range Status   Specimen Description BLOOD LEFT FOREARM  Final   Special Requests IN PEDIATRIC BOTTLE 1CC  Final   Culture NO GROWTH 5 DAYS  Final   Report Status 01/26/2015 FINAL  Final  Culture, blood (routine x 2)     Status: None   Collection Time: 01/21/15  4:00 PM  Result Value Ref Range Status   Specimen Description BLOOD RIGHT FOREARM  Final   Special Requests BOTTLES DRAWN AEROBIC AND ANAEROBIC 5CC  Final   Culture  Setup Time   Final    GRAM POSITIVE COCCI IN CLUSTERS IN BOTH AEROBIC AND ANAEROBIC BOTTLES CRITICAL RESULT CALLED TO, READ BACK BY AND VERIFIED WITH: R. MYRICK,RN AT 1315 ON [id number] BY S. YARBROUGH    Culture STAPHYLOCOCCUS HOMINIS  Final   Report Status 01/25/2015 FINAL  Final   Organism ID, Bacteria STAPHYLOCOCCUS HOMINIS  Final      Susceptibility   Staphylococcus hominis - MIC*    CIPROFLOXACIN >=8 RESISTANT Resistant     ERYTHROMYCIN <=0.25 SENSITIVE Sensitive     GENTAMICIN <=0.5  SENSITIVE Sensitive     OXACILLIN >=4 RESISTANT Resistant     TETRACYCLINE >=16 RESISTANT Resistant     VANCOMYCIN 1 SENSITIVE Sensitive     TRIMETH/SULFA 80 RESISTANT Resistant     CLINDAMYCIN <=0.25 SENSITIVE Sensitive     RIFAMPIN <=0.5 SENSITIVE Sensitive     Inducible Clindamycin NEGATIVE Sensitive     * STAPHYLOCOCCUS HOMINIS  Urine culture     Status: None   Collection Time: 01/21/15  4:45 PM  Result Value Ref Range Status   Specimen Description URINE, CATHETERIZED  Final   Special Requests NONE  Final   Culture   Final    >=100,000 COLONIES/mL VANCOMYCIN RESISTANT ENTEROCOCCUS ISOLATED   Report Status 01/25/2015 FINAL  Final   Organism ID, Bacteria VANCOMYCIN RESISTANT ENTEROCOCCUS ISOLATED  Final      Susceptibility   Vancomycin resistant enterococcus isolated - MIC*    AMPICILLIN <=2 SENSITIVE Sensitive     LEVOFLOXACIN >=8 RESISTANT Resistant     NITROFURANTOIN <=16 SENSITIVE Sensitive     VANCOMYCIN >=32 RESISTANT Resistant     LINEZOLID 2 SENSITIVE Sensitive     * >=100,000 COLONIES/mL VANCOMYCIN RESISTANT ENTEROCOCCUS ISOLATED  MRSA PCR Screening     Status: None   Collection Time: 01/22/15 12:09 AM  Result Value Ref Range Status   MRSA by PCR NEGATIVE  NEGATIVE Final    Comment:        The GeneXpert MRSA Assay (FDA approved for NASAL specimens only), is one component of a comprehensive MRSA colonization surveillance program. It is not intended to diagnose MRSA infection nor to guide or monitor treatment for MRSA infections.   Culture, blood (routine x 2)     Status: None   Collection Time: 01/22/15  6:57 PM  Result Value Ref Range Status   Specimen Description BLOOD RIGHT HAND  Final   Special Requests IN PEDIATRIC BOTTLE 3CC  Final   Culture  Setup Time   Final    GRAM POSITIVE COCCI IN CLUSTERS AEROBIC BOTTLE ONLY CRITICAL RESULT CALLED TO, READ BACK BY AND VERIFIED WITH: S DOTY 01/23/15 @ 1324 M VESTAL    Culture   Final    STAPHYLOCOCCUS  HOMINIS SUSCEPTIBILITIES PERFORMED ON PREVIOUS CULTURE WITHIN THE LAST 5 DAYS.    Report Status 01/25/2015 FINAL  Final  Culture, blood (routine x 2)     Status: None   Collection Time: 01/22/15  7:08 PM  Result Value Ref Range Status   Specimen Description BLOOD RIGHT HAND  Final   Special Requests BOTTLES DRAWN AEROBIC ONLY 5CC  Final   Culture NO GROWTH 5 DAYS  Final   Report Status 01/27/2015 FINAL  Final  Culture, blood (routine x 2)     Status: None (Preliminary result)   Collection Time: 01/24/15  3:56 AM  Result Value Ref Range Status   Specimen Description BLOOD RIGHT ARM  Final   Special Requests IN PEDIATRIC BOTTLE 1CC  Final   Culture NO GROWTH 4 DAYS  Final   Report Status PENDING  Incomplete  Culture, blood (routine x 2)     Status: None   Collection Time: 01/24/15  4:12 AM  Result Value Ref Range Status   Specimen Description BLOOD RIGHT HAND  Final   Special Requests IN PEDIATRIC BOTTLE 1CC  Final   Culture  Setup Time   Final    GRAM POSITIVE COCCI IN CLUSTERS AEROBIC BOTTLE ONLY CRITICAL RESULT CALLED TO, READ BACK BY AND VERIFIED WITH: JEFF  01/25/15 MKELLY    Culture   Final    STAPHYLOCOCCUS SPECIES (COAGULASE NEGATIVE) SUSCEPTIBILITIES PERFORMED ON PREVIOUS CULTURE WITHIN THE LAST 5 DAYS.    Report Status 01/27/2015 FINAL  Final  Culture, blood (routine x 2)     Status: None (Preliminary result)   Collection Time: 01/25/15  4:53 AM  Result Value Ref Range Status   Specimen Description BLOOD RIGHT HAND  Final   Special Requests   Final    BOTTLES DRAWN AEROBIC AND ANAEROBIC 3CC BLUE 5CC PURPLE   Culture NO GROWTH 3 DAYS  Final   Report Status PENDING  Incomplete  Culture, blood (routine x 2)     Status: None (Preliminary result)   Collection Time: 01/25/15  5:04 AM  Result Value Ref Range Status   Specimen Description BLOOD RIGHT HAND  Final   Special Requests BOTTLES DRAWN AEROBIC AND ANAEROBIC 5CC  Final   Culture NO GROWTH 3 DAYS  Final    Report Status PENDING  Incomplete    Anti-infectives    Start     Dose/Rate Route Frequency Ordered Stop   01/28/15 0000  vancomycin (VANCOCIN) 1 GM/200ML SOLN  Status:  Discontinued     1,000 mg 200 mL/hr over 60 Minutes Intravenous Every 12 hours 01/28/15 1132 01/28/15    01/28/15 0000  vancomycin (VANCOCIN) 1 GM/200ML  SOLN     1,000 mg 200 mL/hr over 60 Minutes Intravenous Every 12 hours 01/28/15 1135 03/08/15 2359   01/27/15 1700  cefUROXime (CEFTIN) tablet 500 mg     500 mg Per Tube 2 times daily with meals 01/27/15 0852 01/27/15 1803   01/25/15 1700  cefUROXime (CEFTIN) tablet 500 mg  Status:  Discontinued     500 mg Per Tube 2 times daily with meals 01/25/15 1409 01/27/15 0852   01/25/15 1330  vancomycin (VANCOCIN) IVPB 1000 mg/200 mL premix  Status:  Discontinued     1,000 mg 200 mL/hr over 60 Minutes Intravenous Every 12 hours 01/24/15 1334 01/24/15 1438   01/24/15 1430  vancomycin (VANCOCIN) IVPB 1000 mg/200 mL premix     1,000 mg 200 mL/hr over 60 Minutes Intravenous Every 12 hours 01/24/15 1437     01/24/15 1030  vancomycin (VANCOCIN) IVPB 1000 mg/200 mL premix  Status:  Discontinued     1,000 mg 200 mL/hr over 60 Minutes Intravenous Every 12 hours 01/24/15 1020 01/24/15 1334   01/22/15 2100  vancomycin (VANCOCIN) IVPB 750 mg/150 ml premix  Status:  Discontinued     750 mg 150 mL/hr over 60 Minutes Intravenous Every 12 hours 01/22/15 1012 01/24/15 1020   01/22/15 0130  vancomycin (VANCOCIN) IVPB 750 mg/150 ml premix  Status:  Discontinued     750 mg 150 mL/hr over 60 Minutes Intravenous Every 8 hours 01/21/15 1706 01/22/15 1012   01/22/15 0130  piperacillin-tazobactam (ZOSYN) IVPB 3.375 g  Status:  Discontinued     3.375 g 12.5 mL/hr over 240 Minutes Intravenous 3 times per day 01/21/15 1706 01/24/15 1047   01/21/15 1630  vancomycin (VANCOCIN) IVPB 1000 mg/200 mL premix     1,000 mg 200 mL/hr over 60 Minutes Intravenous  Once 01/21/15 1626 01/21/15 1810   01/21/15 1630   piperacillin-tazobactam (ZOSYN) IVPB 3.375 g     3.375 g 100 mL/hr over 30 Minutes Intravenous  Once 01/21/15 1626 01/21/15 1757      Assessment: 44 yo female with bacteremia is currently on therapeutic vancomycin.  Vancomycin trough is 18. SCr 0.62  Goal of Therapy:  Vancomycin trough level 15-20 mcg/ml  Plan:  - continue vancomycin 1g iv q12h. Monitor renal function closely.  Diedra Sinor, Tsz-Yin 01/28/2015,4:43 PM

## 2015-01-29 ENCOUNTER — Inpatient Hospital Stay (HOSPITAL_COMMUNITY): Payer: Medicare Other

## 2015-01-29 LAB — CULTURE, BLOOD (ROUTINE X 2): CULTURE: NO GROWTH

## 2015-01-29 LAB — GLUCOSE, CAPILLARY
GLUCOSE-CAPILLARY: 104 mg/dL — AB (ref 65–99)
GLUCOSE-CAPILLARY: 99 mg/dL (ref 65–99)
Glucose-Capillary: 102 mg/dL — ABNORMAL HIGH (ref 65–99)

## 2015-01-29 MED ORDER — SODIUM BICARBONATE 650 MG PO TABS: 650.0000 mg | ORAL_TABLET | Freq: Once | ORAL | Status: DC

## 2015-01-29 MED ORDER — PANCRELIPASE (LIP-PROT-AMYL) 12000-38000 UNITS PO CPEP: 2.0000 | ORAL_CAPSULE | Freq: Once | ORAL | Status: DC

## 2015-01-29 MED ORDER — LIDOCAINE HCL 1 % IJ SOLN
INTRAMUSCULAR | Status: AC
  Filled 2015-01-29: qty 20

## 2015-01-29 MED ORDER — ALBUTEROL SULFATE (2.5 MG/3ML) 0.083% IN NEBU
2.5000 mg | INHALATION_SOLUTION | RESPIRATORY_TRACT | Status: DC | PRN
Start: 2015-01-29 — End: 2015-01-29

## 2015-01-29 MED ORDER — HEPARIN SOD (PORK) LOCK FLUSH 100 UNIT/ML IV SOLN
INTRAVENOUS | Status: AC
  Filled 2015-01-29: qty 5

## 2015-01-29 NOTE — Progress Notes (Signed)
Ok per MD for d/c today back to [facility] via EMS.  Nursing notified to call report to facility.  CSW left message on v-mail for sister Kathryne ErikssonJanet Evett.  Patient is alert to person only.  CSW spoke with [facility] who agreed to [facility] patient back to SNF today.  She stated that patient is long term care.  She now has a PICC line in for IV antibiotics.  No further CSW needs identified. CSW signing off.  Lorri Frederickonna T. Andria RheinCrowder, LCSW [phone] (Weekend coverage)

## 2015-01-29 NOTE — Progress Notes (Signed)
Subjective: Patient was seen and examined at bedside. No changes. Nonverbal, does not follow commands.   Objective: Vital signs in last 24 hours: Filed Vitals:   01/28/15 2209 01/28/15 2218 01/29/15 0208 01/29/15 0500  BP:  122/86 118/76   Pulse:  95 85   Temp:  98.6 F (37 C) 98.6 F (37 C)   TempSrc:  Axillary Axillary   Resp:  18    Height:      Weight:    136 lb 11 oz (62 kg)  SpO2: 98% 99% 100%    Weight change: -1 lb 15.8 oz (-0.9 kg) No intake or output data in the 24 hours ending 01/29/15 0847  Physical Exam: Constitutional:  Decorticate posturing. NAD. Makes eye contact occasionally.  Cardiovascular: Regular rate and rhythm. Intact distal pulses.  Pulmonary/Chest: CTAB. No rales or rhonchi appreciated.  Abdominal: Soft. Bowel sounds are normal. She exhibits no distension. There is no guarding.  PEG tube site appears clean and dry. Does not appear infected.  Musculoskeletal: She exhibits no edema.  Neurological:  Nonverbal Does not follow commands  Skin: Skin is warm and dry.   Lab Results: Basic Metabolic Panel:  Recent Labs Lab 01/24/15 0412 01/25/15 0453  01/27/15 0249 01/28/15 0524  NA 144  --   < > 139 139  K 3.3*  --   < > 3.6 4.5  CL 111  --   < > 108 107  CO2 25  --   < > 26 24  GLUCOSE 111*  --   < > 117* 115*  BUN 8  --   < > 7 9  CREATININE 0.66  --   < > 0.60 0.62  CALCIUM 8.3*  --   < > 8.7* 9.0  MG 2.0 2.1  --   --   --   PHOS 4.2  --   --   --   --   < > = values in this interval not displayed. Liver Function Tests:  Recent Labs Lab 01/24/15 0412  ALBUMIN 2.7*   CBC:  Recent Labs Lab 01/23/15 0335  01/27/15 0249 01/28/15 0524  WBC 11.0*  < > 8.1 8.3  NEUTROABS 6.5  --   --   --   HGB 11.1*  < > 11.1* 13.4  HCT 34.7*  < > 35.6* 40.9  MCV 87.8  < > 87.9 86.1  PLT 208  < > 276 234  < > = values in this interval not displayed.  Micro Results: Recent Results (from the past 240 hour(s))  Culture, blood (routine x 2)      Status: None   Collection Time: 01/21/15  3:55 PM  Result Value Ref Range Status   Specimen Description BLOOD LEFT FOREARM  Final   Special Requests IN PEDIATRIC BOTTLE 1CC  Final   Culture NO GROWTH 5 DAYS  Final   Report Status 01/26/2015 FINAL  Final  Culture, blood (routine x 2)     Status: None   Collection Time: 01/21/15  4:00 PM  Result Value Ref Range Status   Specimen Description BLOOD RIGHT FOREARM  Final   Special Requests BOTTLES DRAWN AEROBIC AND ANAEROBIC 5CC  Final   Culture  Setup Time   Final    GRAM POSITIVE COCCI IN CLUSTERS IN BOTH AEROBIC AND ANAEROBIC BOTTLES CRITICAL RESULT CALLED TO, READ BACK BY AND VERIFIED WITH: R. MYRICK,RN AT 1315 ON 112016 BY S. YARBROUGH    Culture STAPHYLOCOCCUS HOMINIS  Final  Report Status 01/25/2015 FINAL  Final   Organism ID, Bacteria STAPHYLOCOCCUS HOMINIS  Final      Susceptibility   Staphylococcus hominis - MIC*    CIPROFLOXACIN >=8 RESISTANT Resistant     ERYTHROMYCIN <=0.25 SENSITIVE Sensitive     GENTAMICIN <=0.5 SENSITIVE Sensitive     OXACILLIN >=4 RESISTANT Resistant     TETRACYCLINE >=16 RESISTANT Resistant     VANCOMYCIN 1 SENSITIVE Sensitive     TRIMETH/SULFA 80 RESISTANT Resistant     CLINDAMYCIN <=0.25 SENSITIVE Sensitive     RIFAMPIN <=0.5 SENSITIVE Sensitive     Inducible Clindamycin NEGATIVE Sensitive     * STAPHYLOCOCCUS HOMINIS  Urine culture     Status: None   Collection Time: 01/21/15  4:45 PM  Result Value Ref Range Status   Specimen Description URINE, CATHETERIZED  Final   Special Requests NONE  Final   Culture   Final    >=100,000 COLONIES/mL VANCOMYCIN RESISTANT ENTEROCOCCUS ISOLATED   Report Status 01/25/2015 FINAL  Final   Organism ID, Bacteria VANCOMYCIN RESISTANT ENTEROCOCCUS ISOLATED  Final      Susceptibility   Vancomycin resistant enterococcus isolated - MIC*    AMPICILLIN <=2 SENSITIVE Sensitive     LEVOFLOXACIN >=8 RESISTANT Resistant     NITROFURANTOIN <=16 SENSITIVE Sensitive      VANCOMYCIN >=32 RESISTANT Resistant     LINEZOLID 2 SENSITIVE Sensitive     * >=100,000 COLONIES/mL VANCOMYCIN RESISTANT ENTEROCOCCUS ISOLATED  MRSA PCR Screening     Status: None   Collection Time: 01/22/15 12:09 AM  Result Value Ref Range Status   MRSA by PCR NEGATIVE NEGATIVE Final    Comment:        The GeneXpert MRSA Assay (FDA approved for NASAL specimens only), is one component of a comprehensive MRSA colonization surveillance program. It is not intended to diagnose MRSA infection nor to guide or monitor treatment for MRSA infections.   Culture, blood (routine x 2)     Status: None   Collection Time: 01/22/15  6:57 PM  Result Value Ref Range Status   Specimen Description BLOOD RIGHT HAND  Final   Special Requests IN PEDIATRIC BOTTLE 3CC  Final   Culture  Setup Time   Final    GRAM POSITIVE COCCI IN CLUSTERS AEROBIC BOTTLE ONLY CRITICAL RESULT CALLED TO, READ BACK BY AND VERIFIED WITH: S DOTY 01/23/15 @ 1324 M VESTAL    Culture   Final    STAPHYLOCOCCUS HOMINIS SUSCEPTIBILITIES PERFORMED ON PREVIOUS CULTURE WITHIN THE LAST 5 DAYS.    Report Status 01/25/2015 FINAL  Final  Culture, blood (routine x 2)     Status: None   Collection Time: 01/22/15  7:08 PM  Result Value Ref Range Status   Specimen Description BLOOD RIGHT HAND  Final   Special Requests BOTTLES DRAWN AEROBIC ONLY 5CC  Final   Culture NO GROWTH 5 DAYS  Final   Report Status 01/27/2015 FINAL  Final  Culture, blood (routine x 2)     Status: None (Preliminary result)   Collection Time: 01/24/15  3:56 AM  Result Value Ref Range Status   Specimen Description BLOOD RIGHT ARM  Final   Special Requests IN PEDIATRIC BOTTLE 1CC  Final   Culture NO GROWTH 4 DAYS  Final   Report Status PENDING  Incomplete  Culture, blood (routine x 2)     Status: None   Collection Time: 01/24/15  4:12 AM  Result Value Ref Range Status   Specimen Description BLOOD  RIGHT HAND  Final   Special Requests IN PEDIATRIC BOTTLE 1CC   Final   Culture  Setup Time   Final    GRAM POSITIVE COCCI IN CLUSTERS AEROBIC BOTTLE ONLY CRITICAL RESULT CALLED TO, READ BACK BY AND VERIFIED WITH: JEFF @0613  01/25/15 MKELLY    Culture   Final    STAPHYLOCOCCUS SPECIES (COAGULASE NEGATIVE) SUSCEPTIBILITIES PERFORMED ON PREVIOUS CULTURE WITHIN THE LAST 5 DAYS.    Report Status 01/27/2015 FINAL  Final  Culture, blood (routine x 2)     Status: None (Preliminary result)   Collection Time: 01/25/15  4:53 AM  Result Value Ref Range Status   Specimen Description BLOOD RIGHT HAND  Final   Special Requests   Final    BOTTLES DRAWN AEROBIC AND ANAEROBIC 3CC BLUE 5CC PURPLE   Culture NO GROWTH 3 DAYS  Final   Report Status PENDING  Incomplete  Culture, blood (routine x 2)     Status: None (Preliminary result)   Collection Time: 01/25/15  5:04 AM  Result Value Ref Range Status   Specimen Description BLOOD RIGHT HAND  Final   Special Requests BOTTLES DRAWN AEROBIC AND ANAEROBIC 5CC  Final   Culture NO GROWTH 3 DAYS  Final   Report Status PENDING  Incomplete   Studies/Results: No results found. Medications: I have reviewed the patient's current medications. Scheduled Meds: . antiseptic oral rinse  7 mL Mouth Rinse q12n4p  . baclofen  10 mg Per Tube TID  . chlorhexidine  15 mL Mouth Rinse BID  . enoxaparin (LOVENOX) injection  40 mg Subcutaneous Q24H  . feeding supplement (PRO-STAT SUGAR FREE 64)  30 mL Per Tube Daily  . free water  100 mL Per Tube Q4H  . HYDROcodone-acetaminophen  1 tablet Oral 2 times per day  . loratadine  10 mg Per Tube Daily  . meloxicam  7.5 mg Per Tube Q breakfast  . multivitamin  5 mL Per Tube Daily  . pantoprazole sodium  40 mg Per Tube Daily  . polyethylene glycol  17 g Per Tube Daily  . rOPINIRole  0.25 mg Per Tube q1800  . sucralfate  1 g Per Tube 4 times per day  . vancomycin  1,000 mg Intravenous Q12H  . vitamin B-12  1,000 mcg Per Tube Daily   Continuous Infusions: . feeding supplement (JEVITY 1.2  CAL) 1,000 mL (01/28/15 2040)   PRN Meds:.albuterol, bisacodyl, morphine CONCENTRATE Assessment/Plan: Principal Problem:   HCAP (healthcare-associated pneumonia) Active Problems:   Frontotemporal dementia   Dysphasia   Staphylococcus hominis sepsis (HCC)   UTI (urinary tract infection) due to Enterococcus  Staph Hominis bacteremia sensitive to Vanc -With possible endocarditis  this was seen on initial admission bcx. unclear source as she does not have any skin breakdown.  She had persistent coag neg staph in subsequent bcx's on 11/19, 11/20, and 11/22 while on vancomycin since admission. Her bcx from 11/23 has remained negative for 3 days now.  -Appreciate ID recs. Treat as possible endocarditis - Vanc x 6 weeks from 01/25/15 to 03/08/2015. placing central line today so that patient can continue receiving IV antibiotics upon discharge.   - will need Vanc trough checked at the nursing facility with adjustment of vanc dosing as appropriate.   HCAP with Staph Hominis Bacteremia  Presented from nursing facility with some increased cough. Initial CXR concerning for RLL infiltrate, but repeat cxr did not show the infiltrate. She seems to be improving al though its hard to assess improvement  with her severe dementia. She is at risk of aspiration with PEG tube dependence. She was also admitted 3 months ago for pneumonia. Patient remained afebrile here and satting well on room air. We finished treatment for HCAP with vanc+ zosyn (then ceftin) for total 7 days.  Frontotemporal Dementia  7 year history. Patient became wheelchair bound in 2013 and has been requiring total care. [facility]. She has a feeding tube. -Norco 5-325 mg -Morphine 5 mg q4 prn SOB -Meloxicam 7.5 mg daily  -Ativan 1 mg daily  -Baclofen 10 mg TID -Ropinirole 0.25 mg daily  -Neuro checks   Hypokalemia: Resolved   - Repleted as needed.  B12 deficiency  -Cyanocobalamin   Constipation -Dulcolax  suppository  -Miralax   GI ppx -Nexium 40 mg daily  -Sucralfate   DVT ppx -Lovenox  Diet Patient has a G tube in place for enteral nutrition.  -Free water per tube q4 hrs  -Multivitamin liquid -Consult to dietician   Code: FULL   Dispo: back to nursing facility after central line placement.  The patient does have a current PCP [name](Saad Amin, MD) and [facility] after discharge.  The patient does not have transportation limitations that hinder transportation to clinic appointments.  .Services Needed at time of discharge: Y = Yes, Blank = No PT:   OT:   RN:   Equipment:   Other:     LOS: 8 days   John GiovanniVasundhra Sheryl Towell, MD 01/29/2015, 8:47 AM

## 2015-01-29 NOTE — Progress Notes (Addendum)
Spoke with [name] [name] the case manger about seeing if [name] [name] has a bedLupita [name] available [facility], since Pt. Is being discharged. [name] [name] 3:53 PM

## 2015-01-29 NOTE — Procedures (Signed)
Interventional Radiology Procedure Note  Procedure: Placement of a LEFT IJ tunneled single lumen PowerPICC. Tip location:  Superior cavoatrial junction.  Ready for use.  Complications: None  Estimated Blood Loss: 0  Recommendations:  - Routine line care  Signed,  Sterling BigHeath K. Dajuan Turnley, MD

## 2015-01-29 NOTE — Progress Notes (Signed)
Discharge summary was done on 01/28/15 as we were anticipating discharge yesterday. I could not update the discharge summary to reflect today's date as my attending has already co-signed the summary from 01/28/15.  There are no changes to this summary so whatever information is on 01/28/15 summary is still accurate. Please see that for details.

## 2015-01-30 LAB — CULTURE, BLOOD (ROUTINE X 2)
CULTURE: NO GROWTH
CULTURE: NO GROWTH

## 2015-03-21 ENCOUNTER — Other Ambulatory Visit (HOSPITAL_COMMUNITY): Payer: Self-pay | Admitting: Adult Health

## 2015-03-21 DIAGNOSIS — R7881 Bacteremia: Secondary | ICD-10-CM

## 2015-03-22 ENCOUNTER — Ambulatory Visit (HOSPITAL_COMMUNITY): Admission: RE | Admit: 2015-03-22 | Payer: Medicare Other | Source: Ambulatory Visit

## 2015-03-24 ENCOUNTER — Ambulatory Visit (HOSPITAL_COMMUNITY)
Admission: RE | Admit: 2015-03-24 | Discharge: 2015-03-24 | Disposition: A | Discharge status: Home / Self Care | Payer: Medicare Other | Source: Ambulatory Visit | Attending: Adult Health | Admitting: Adult Health

## 2015-03-24 DIAGNOSIS — Z452 Encounter for adjustment and management of vascular access device: Secondary | ICD-10-CM | POA: Insufficient documentation

## 2015-03-24 DIAGNOSIS — R7881 Bacteremia: Secondary | ICD-10-CM

## 2015-03-24 MED ORDER — LIDOCAINE HCL 1 % IJ SOLN
INTRAMUSCULAR | Status: AC
  Filled 2015-03-24: qty 20

## 2015-03-24 NOTE — Procedures (Signed)
Left IJ tunneled PICC removed in its entirety without immediate complications. Gauze dressing applied to site.

## 2015-06-17 ENCOUNTER — Encounter (HOSPITAL_COMMUNITY): Payer: Self-pay | Admitting: Emergency Medicine

## 2015-06-17 ENCOUNTER — Emergency Department (HOSPITAL_COMMUNITY)
Admission: EM | Admit: 2015-06-17 | Discharge: 2015-06-17 | Disposition: A | Discharge status: Home / Self Care | Payer: Medicare Other | Attending: Emergency Medicine | Admitting: Emergency Medicine

## 2015-06-17 DIAGNOSIS — Z79899 Other long term (current) drug therapy: Secondary | ICD-10-CM | POA: Insufficient documentation

## 2015-06-17 DIAGNOSIS — Z8701 Personal history of pneumonia (recurrent): Secondary | ICD-10-CM | POA: Insufficient documentation

## 2015-06-17 DIAGNOSIS — Z791 Long term (current) use of non-steroidal anti-inflammatories (NSAID): Secondary | ICD-10-CM | POA: Insufficient documentation

## 2015-06-17 DIAGNOSIS — Z87440 Personal history of urinary (tract) infections: Secondary | ICD-10-CM | POA: Insufficient documentation

## 2015-06-17 DIAGNOSIS — Z8619 Personal history of other infectious and parasitic diseases: Secondary | ICD-10-CM | POA: Insufficient documentation

## 2015-06-17 DIAGNOSIS — K219 Gastro-esophageal reflux disease without esophagitis: Secondary | ICD-10-CM | POA: Insufficient documentation

## 2015-06-17 DIAGNOSIS — F0390 Unspecified dementia without behavioral disturbance: Secondary | ICD-10-CM | POA: Diagnosis not present

## 2015-06-17 DIAGNOSIS — K9420 Gastrostomy complication, unspecified: Secondary | ICD-10-CM

## 2015-06-17 DIAGNOSIS — K9423 Gastrostomy malfunction: Secondary | ICD-10-CM | POA: Diagnosis not present

## 2015-06-17 DIAGNOSIS — E538 Deficiency of other specified B group vitamins: Secondary | ICD-10-CM | POA: Diagnosis not present

## 2015-06-17 DIAGNOSIS — F329 Major depressive disorder, single episode, unspecified: Secondary | ICD-10-CM | POA: Diagnosis not present

## 2015-06-17 NOTE — ED Notes (Signed)
MD at bedside to replace GT. Pt tolerated well.

## 2015-06-17 NOTE — ED Provider Notes (Signed)
CSN: [id number]     Arrival date & time 06/17/15  1504 History   First MD Initiated Contact with Patient 06/17/15 1514     Chief Complaint  Patient presents with  . Feeding tube problem      Level V caveat: Nonverbal  HPI Patient is brought to the emergency department from her nursing facility after the top of her gastrostomy tubes put off.  Patient is nonverbal.  Patient has been in her normal state of health per report.   Past Medical History  Diagnosis Date  . Dementia   . Restless leg syndrome   . Esophageal reflux   . UTI (urinary tract infection)   . Depression   . Pneumonia   . Constipation   . Aphagia   . Bacteremia   . Sepsis (HCC)   . [name] disease   . Vitamin B12 deficiency   . Staphylococcus hominis sepsis (HCC) 01/24/2015  . UTI (urinary tract infection) due to Enterococcus 01/24/2015   Past Surgical History  Procedure Laterality Date  . Peg tube placement      16 [place]  . Cholecystectomy     Family History  Problem Relation Age of Onset  . Family history unknown: Yes   Social History  Substance Use Topics  . Smoking status: Never Smoker   . Smokeless tobacco: None  . Alcohol Use: No   OB History    No data available     Review of Systems  Unable to perform ROS: Patient nonverbal      Allergies  Sulfa antibiotics  Home Medications   Prior to Admission medications   Medication Sig Start Date End Date Taking? Authorizing Provider  albuterol (PROVENTIL) (2.5 MG/3ML) 0.083% nebulizer solution Take 3 mLs (2.5 mg total) by nebulization 4 (four) times daily. Patient taking differently: Take 2.5 mg by nebulization every 6 (six) hours as needed for wheezing.  10/20/13   Christiane Ha, MD  baclofen (LIORESAL) 10 MG tablet 10 mg by PEG Tube route 3 (three) times daily. 9am, 2pm, 9pm    Historical Provider, MD  bisacodyl (DULCOLAX) 10 MG suppository Place 10 mg rectally every other day as needed for moderate constipation.     Historical Provider, MD  cetirizine (ZYRTEC) 10 MG tablet 10 mg by PEG Tube route daily.     Historical Provider, MD  Cholecalciferol (VITAMIN D-3) 5000 UNITS TABS 5,000 Units by PEG Tube route 2 (two) times a week. On Monday and Saturday    Historical Provider, MD  cyanocobalamin 500 MCG tablet 500 mcg by PEG Tube route daily.    Historical Provider, MD  esomeprazole (NEXIUM) 40 MG packet 40 mg by PEG Tube route daily.    Historical Provider, MD  guaiFENesin (ROBITUSSIN) 100 MG/5ML SOLN 400 mg by PEG Tube route every 6 (six) hours as needed (congestion).     Historical Provider, MD  LORazepam (ATIVAN) 1 MG tablet 1 mg by PEG Tube route See admin instructions. Give 1 tablet (1 mg) via peg tube 30 minutes prior to dental appointment    Historical Provider, MD  meloxicam (MOBIC) 7.5 MG tablet 7.5 mg by PEG Tube route daily.     Historical Provider, MD  morphine (ROXANOL) 20 MG/ML concentrated solution Place 0.25 mLs (5 mg total) into feeding tube every 2 (two) hours as needed (pain/ shortness of breath/ air hunger). 01/28/15   Hyacinth Meeker, MD  Multiple Vitamin (MULTIVITAMIN) LIQD 5 mLs by PEG Tube route daily.    Historical  Provider, MD  polyethylene glycol (MIRALAX / GLYCOLAX) packet 17 g by PEG Tube route daily.     Historical Provider, MD  PRESCRIPTION MEDICATION by PEG Tube route continuous. Enteral Feed order Two Cal HN 30 ml/hr    Historical Provider, MD  rOPINIRole (REQUIP) 0.25 MG tablet 0.25 mg by PEG Tube route daily at 6 PM.     Historical Provider, MD  Saccharomyces boulardii (FLORASTOR PO) 1 capsule by PEG Tube route daily.    Historical Provider, MD  sucralfate (CARAFATE) 1 GM/10ML suspension 1 g by PEG Tube route 4 (four) times daily. 9am, 2pm, 4pm, 8pm    Historical Provider, MD  vitamin B-12 (CYANOCOBALAMIN) 1000 MCG tablet 1,000 mcg by PEG Tube route daily.    Historical Provider, MD  Water For Irrigation, Sterile (FREE WATER) SOLN 175 mLs by PEG Tube route every 4 (four) hours.      Historical Provider, MD   There were no vitals taken for this visit. Physical Exam  Constitutional: She appears well-developed and well-nourished.  HENT:  Head: Normocephalic.  Eyes: EOM are normal.  Neck: Normal range of motion.  Pulmonary/Chest: Effort normal.  Abdominal: Soft. She exhibits no distension. There is no tenderness.  Gastrostomy tube is in the stoma and there is no surrounding erythema or tenderness.  22 French gastrostomy tube  Musculoskeletal: Normal range of motion.  Neurological: She is alert.  Skin: Skin is warm.  Psychiatric: She has a normal mood and affect.  Nursing note and vitals reviewed.   ED Course  Procedures (including critical care time)      Gastrostomy tube replacement Performed by: Lyanne CoAMPOS,Jeb Schloemer M Consent: Verbal consent obtained. Risks and benefits: risks, benefits and alternatives were discussed Required items: required blood products, implants, devices, and special equipment available Patient identity confirmed: hospital-assigned identification number Time out: Immediately prior to procedure a "time out" was called to verify the correct patient, procedure, equipment, support staff and site/side marked as required. Preparation: Patient was prepped and draped in the usual sterile fashion. Patient tolerance: Patient tolerated the procedure well with no immediate complications. Comments: 22 french Gastrostomy tube placed without difficulty       Labs Review Labs Reviewed - No data to display  Imaging Review No results found. I have personally reviewed and evaluated these images and lab results as part of my medical decision-making.   EKG Interpretation None      MDM   Final diagnoses:  None    Gastrostomy tube placed without difficulty.  Patient tolerated well.  Discharge back to her facility    Azalia BilisKevin Agam Tuohy, MD 06/17/15 1600

## 2015-06-17 NOTE — Discharge Instructions (Signed)
Care of a Feeding Tube People who have trouble swallowing or cannot take food or medicine by mouth are sometimes given feeding tubes. A feeding tube can go into the nose and down to the stomach or through the skin in the abdomen and into the stomach or small bowel. Some of the names of these feeding tubes are gastrostomy tubes, PEG lines, nasogastric tubes, and gastrojejunostomy tubes.  SUPPLIES NEEDED TO CARE FOR THE TUBE SITE  Clean gloves.  Clean wash cloth, gauze pads, or soft paper towel.  Cotton swabs.  Skin barrier ointment or cream.  Soap and water.  Pre-cut foam pads or gauze (that go around the tube).  Tube tape. TUBE SITE CARE 1. Have all supplies ready and available. 2. Wash hands well. 3. Put on clean gloves. 4. Remove the soiled foam pad or gauze, if present, that is found under the tube stabilizer. Change the foam pad or gauze daily or when soiled or moist. 5. Check the skin around the tube site for redness, rash, swelling, drainage, or extra tissue growth. If you notice any of these, call your caregiver. 6. Moisten gauze and cotton swabs with water and soap. 7. Wipe the area closest to the tube (right near the stoma) with cotton swabs. Wipe the surrounding skin with moistened gauze. Rinse with water. 8. Dry the skin and stoma site with a dry gauze pad or soft paper towel. Do not use antibiotic ointments at the tube site. 9. If the skin is red, apply a skin barrier cream or ointment (such as petroleum jelly) in a circular motion, using a cotton swab. The cream or ointment will provide a moisture barrier for the skin and helps with wound healing. 10. Apply a new pre-cut foam pad or gauze around the tube. Secure it with tape around the edges. If no drainage is present, foam pads or gauze may be left off. 11. Use tape or an anchoring device to fasten the feeding tube to the skin for comfort or as directed. Rotate where you tape the tube to avoid skin damage from the  adhesive. 12. Position the person in a semi-upright position (30-45 degree angle). 13. Throw away used supplies. 14. Remove gloves. 15. Wash hands. SUPPLIES NEEDED TO FLUSH A FEEDING TUBE  Clean gloves.  60 mL syringe (that connects to the feeding tube).  Towel.  Water. FLUSHING A FEEDING TUBE  1. Have all supplies ready and available. 2. Wash hands well. 3. Put on clean gloves. 4. Draw up 30 mL of water in the syringe. 5. Kink the feeding tube while disconnecting it from the feeding-bag tubing or while removing the plug at the end of the tube. Kinking closes the tube and prevents secretions in the tube from spilling out. 6. Insert the tip of the syringe into the end of the feeding tube. Release the kink. Slowly inject the water. 7. If unable to inject the water, the person with the feeding tube should lay on his or her left side. The tip of the tube may be against the stomach wall, blocking fluid flow. Changing positions may move the tip away from the stomach wall. After repositioning, try injecting the water again. 8. After injecting the water, remove the syringe. 9. Always flush before giving the first medicine, between medicines, and after the final medicine before starting a feeding. This prevents medicines from clogging the tube. 10. Throw away used supplies. 11. Remove gloves. 12. Wash hands.   This information is not intended to replace   advice given to you by your health care provider. Make sure you discuss any questions you have with your health care provider.   Document Released: 02/18/2005 Document Revised: 02/05/2012 Document Reviewed: 10/03/2011 Elsevier Interactive Patient Education 2016 [employer]   

## 2015-06-17 NOTE — ED Notes (Signed)
[facility] Expected date: 06/17/15 Expected time: 2:56 PM Means of arrival:  Comments: Feeding tube replacement

## 2015-06-17 NOTE — ED Notes (Signed)
[facility] with [facility] problem.  Per staff, it is split and needs changing and they don't have them at the facility. Pt is contracted d/t developmental issues and is at her baseline of non-verbal.  VS: CBG 98, 96, 99%RA, 18, unable to obtain BP d/t contracted arms.

## 2015-06-17 NOTE — ED Notes (Signed)
Awake. Resp even and unlabored. No audible adventitious breath sounds noted. ABC's intact.

## 2015-07-28 ENCOUNTER — Emergency Department (HOSPITAL_COMMUNITY): Payer: Medicare Other

## 2015-07-28 ENCOUNTER — Observation Stay (HOSPITAL_COMMUNITY)
Admission: EM | Admit: 2015-07-28 | Discharge: 2015-07-30 | Disposition: A | Discharge status: Skilled Nursing Facility | Payer: Medicare Other | Attending: Internal Medicine | Admitting: Internal Medicine

## 2015-07-28 ENCOUNTER — Encounter (HOSPITAL_COMMUNITY): Payer: Self-pay | Admitting: *Deleted

## 2015-07-28 DIAGNOSIS — S82831A Other fracture of upper and lower end of right fibula, initial encounter for closed fracture: Secondary | ICD-10-CM

## 2015-07-28 DIAGNOSIS — Z931 Gastrostomy status: Secondary | ICD-10-CM | POA: Diagnosis not present

## 2015-07-28 DIAGNOSIS — K5900 Constipation, unspecified: Secondary | ICD-10-CM | POA: Insufficient documentation

## 2015-07-28 DIAGNOSIS — S82409A Unspecified fracture of shaft of unspecified fibula, initial encounter for closed fracture: Secondary | ICD-10-CM | POA: Diagnosis not present

## 2015-07-28 DIAGNOSIS — S82301A Unspecified fracture of lower end of right tibia, initial encounter for closed fracture: Secondary | ICD-10-CM | POA: Diagnosis present

## 2015-07-28 DIAGNOSIS — S82401A Unspecified fracture of shaft of right fibula, initial encounter for closed fracture: Secondary | ICD-10-CM

## 2015-07-28 DIAGNOSIS — K219 Gastro-esophageal reflux disease without esophagitis: Secondary | ICD-10-CM | POA: Diagnosis not present

## 2015-07-28 DIAGNOSIS — X58XXXA Exposure to other specified factors, initial encounter: Secondary | ICD-10-CM | POA: Insufficient documentation

## 2015-07-28 DIAGNOSIS — F0280 Dementia in other diseases classified elsewhere without behavioral disturbance: Secondary | ICD-10-CM | POA: Diagnosis not present

## 2015-07-28 DIAGNOSIS — S82201A Unspecified fracture of shaft of right tibia, initial encounter for closed fracture: Secondary | ICD-10-CM | POA: Diagnosis present

## 2015-07-28 DIAGNOSIS — N390 Urinary tract infection, site not specified: Secondary | ICD-10-CM

## 2015-07-28 LAB — BASIC METABOLIC PANEL
Anion gap: 9 (ref 5–15)
BUN: 13 mg/dL (ref 6–20)
CALCIUM: 9.2 mg/dL (ref 8.9–10.3)
CO2: 24 mmol/L (ref 22–32)
Chloride: 107 mmol/L (ref 101–111)
Creatinine, Ser: 0.77 mg/dL (ref 0.44–1.00)
GFR calc Af Amer: >60 mL/min (ref >60)
GLUCOSE: 100 mg/dL — AB (ref 65–99)
Potassium: 4.1 mmol/L (ref 3.5–5.1)
Sodium: 140 mmol/L (ref 135–145)

## 2015-07-28 LAB — CBC WITH DIFFERENTIAL/PLATELET
BASOS ABS: 0 10*3/uL (ref 0.0–0.1)
BASOS PCT: 0 %
EOS PCT: 4 %
Eosinophils Absolute: 0.4 10*3/uL (ref 0.0–0.7)
HCT: 36.9 % (ref 36.0–46.0)
Hemoglobin: 11.3 g/dL — ABNORMAL LOW (ref 12.0–15.0)
LYMPHS PCT: 27 %
Lymphs Abs: 3.3 10*3/uL (ref 0.7–4.0)
MCH: 25.8 pg — ABNORMAL LOW (ref 26.0–34.0)
MCHC: 30.6 g/dL (ref 30.0–36.0)
MCV: 84.2 fL (ref 78.0–100.0)
MONO ABS: 0.9 10*3/uL (ref 0.1–1.0)
MONOS PCT: 8 %
Neutro Abs: 7.5 10*3/uL (ref 1.7–7.7)
Neutrophils Relative %: 61 %
PLATELETS: 355 10*3/uL (ref 150–400)
RBC: 4.38 MIL/uL (ref 3.87–5.11)
RDW: 16.5 % — AB (ref 11.5–15.5)
WBC: 12.2 10*3/uL — ABNORMAL HIGH (ref 4.0–10.5)

## 2015-07-28 LAB — PROCALCITONIN: Procalcitonin: 0.18 ng/mL

## 2015-07-28 LAB — URINALYSIS, ROUTINE W REFLEX MICROSCOPIC
BILIRUBIN URINE: NEGATIVE
GLUCOSE, UA: NEGATIVE mg/dL
KETONES UR: NEGATIVE mg/dL
Nitrite: NEGATIVE
PH: 7 (ref 5.0–8.0)
PROTEIN: NEGATIVE mg/dL
Specific Gravity, Urine: 1.015 (ref 1.005–1.030)

## 2015-07-28 LAB — URINE MICROSCOPIC-ADD ON

## 2015-07-28 LAB — SEDIMENTATION RATE: Sed Rate: 40 mm/hr — ABNORMAL HIGH (ref 0–22)

## 2015-07-28 MED ORDER — ADULT MULTIVITAMIN LIQUID CH
15.0000 mL | Freq: Every day | ORAL | Status: DC
  Administered 2015-07-29 – 2015-07-30 (×2): 15 mL
  Filled 2015-07-28 (×2): qty 15

## 2015-07-28 MED ORDER — OSMOLITE 1.2 CAL PO LIQD
1000.0000 mL | ORAL | Status: DC
  Administered 2015-07-28: 1000 mL
  Filled 2015-07-28 (×2): qty 1000

## 2015-07-28 MED ORDER — BISACODYL 10 MG RE SUPP: 10.0000 mg | RECTAL | Status: DC | PRN

## 2015-07-28 MED ORDER — OMEPRAZOLE 2 MG/ML ORAL SUSPENSION
40.0000 mg | Freq: Every day | ORAL | Status: DC
  Administered 2015-07-28 – 2015-07-30 (×3): 40 mg
  Filled 2015-07-28 (×3): qty 20

## 2015-07-28 MED ORDER — VITAMIN B-12 1000 MCG PO TABS
1000.0000 ug | ORAL_TABLET | Freq: Every day | ORAL | Status: DC
  Administered 2015-07-28 – 2015-07-30 (×3): 1000 ug
  Filled 2015-07-28 (×3): qty 1

## 2015-07-28 MED ORDER — SUCRALFATE 1 GM/10ML PO SUSP
1.0000 g | Freq: Four times a day (QID) | ORAL | Status: DC
  Administered 2015-07-28 – 2015-07-30 (×8): 1 g
  Filled 2015-07-28 (×7): qty 10

## 2015-07-28 MED ORDER — JEVITY 1.2 CAL PO LIQD: 1000.0000 mL | ORAL | Status: DC

## 2015-07-28 MED ORDER — BACLOFEN 10 MG PO TABS
10.0000 mg | ORAL_TABLET | Freq: Three times a day (TID) | ORAL | Status: DC
  Administered 2015-07-28 – 2015-07-30 (×6): 10 mg
  Filled 2015-07-28 (×6): qty 1

## 2015-07-28 MED ORDER — LORATADINE 10 MG PO TABS
10.0000 mg | ORAL_TABLET | Freq: Every day | ORAL | Status: DC
  Administered 2015-07-29 – 2015-07-30 (×2): 10 mg
  Filled 2015-07-28 (×2): qty 1

## 2015-07-28 MED ORDER — POLYETHYLENE GLYCOL 3350 17 G PO PACK
17.0000 g | PACK | Freq: Every day | ORAL | Status: DC
  Administered 2015-07-28 – 2015-07-30 (×3): 17 g
  Filled 2015-07-28 (×3): qty 1

## 2015-07-28 MED ORDER — CHOLECALCIFEROL 400 UNIT/ML PO LIQD: 5000.0000 [IU] | ORAL | Status: DC

## 2015-07-28 MED ORDER — ALBUTEROL SULFATE (2.5 MG/3ML) 0.083% IN NEBU: 2.5000 mg | INHALATION_SOLUTION | Freq: Four times a day (QID) | RESPIRATORY_TRACT | Status: DC | PRN

## 2015-07-28 MED ORDER — MELOXICAM 7.5 MG PO TABS
7.5000 mg | ORAL_TABLET | Freq: Every day | ORAL | Status: DC
  Administered 2015-07-28 – 2015-07-30 (×3): 7.5 mg
  Filled 2015-07-28 (×3): qty 1

## 2015-07-28 MED ORDER — ADULT MULTIVITAMIN LIQUID CH
5.0000 mL | Freq: Every day | ORAL | Status: DC
  Filled 2015-07-28: qty 15

## 2015-07-28 MED ORDER — ENOXAPARIN SODIUM 40 MG/0.4ML ~~LOC~~ SOLN
40.0000 mg | SUBCUTANEOUS | Status: DC
  Administered 2015-07-28 – 2015-07-29 (×2): 40 mg via SUBCUTANEOUS
  Filled 2015-07-28 (×2): qty 0.4

## 2015-07-28 MED ORDER — ROPINIROLE HCL 0.5 MG PO TABS
0.2500 mg | ORAL_TABLET | Freq: Every day | ORAL | Status: DC
  Administered 2015-07-28 – 2015-07-29 (×2): 0.25 mg
  Filled 2015-07-28 (×2): qty 1

## 2015-07-28 NOTE — ED Notes (Addendum)
Pt arrives via [facility] from [facility] after having a positive tib/fib fx on xray at nursing home. Nursing facility states she has had swelling x3 weeks, with no known fall or documentation. Pt has an obvious deformity to the right lower leg and is grimacing upon arrival. Pt has a hx of Jacob's disease and dementia and doesn't move or talk at baseline. Pt is severely contractured at baseline.

## 2015-07-28 NOTE — ED Notes (Signed)
Admitting at bedside 

## 2015-07-28 NOTE — Consult Note (Signed)
Reason for Consult:  Right ankle injury Referring Physician: Dr. Mitchell [name] [name] is an 45 y.o. female.  HPI: 45 y/o female with PMH of dementia from [place] disease.  She has lived in a SNF for many years.  Her sister and brother are with her at the bedside and provide the history.  They report that they noted swelling of her right ankle about 3 weeks ago when they visited her.  She was still having swelling and apparent pain, so she was brought to the ER today.  Dr. Alvino Chapel asked me to see her for evaluation of the ankel injury.  The patient does not speak and cannot provide any history.  Her siblings report that she seems to have pain with any motion of her right foot or ankle.  She is not diabetic.  She is not a smoker.  She has a current diagnosis of pneumonia and UTI.  Past Medical History  Diagnosis Date  . Dementia   . Restless leg syndrome   . Esophageal reflux   . UTI (urinary tract infection)   . Depression   . Pneumonia   . Constipation   . Aphagia   . Bacteremia   . Sepsis ([place])   . [name] [name] disease   . Vitamin B12 deficiency   . Staphylococcus hominis sepsis ([place]) 01/24/2015  . UTI (urinary tract infection) due to Enterococcus 01/24/2015    Past Surgical History  Procedure Laterality Date  . Peg tube placement      16 [place]  . Cholecystectomy      Family History  Problem Relation Age of Onset  . Family history unknown: Yes    Social History:  reports that she has never smoked. She does not have any smokeless tobacco history on file. She reports that she does not drink alcohol or use illicit drugs.  Allergies:  Allergies  Allergen Reactions  . Sulfa Antibiotics Other (See Comments)    Per MAR    Medications: I have reviewed the patient's current medications.  Results for orders placed or performed during the hospital encounter of 07/28/15 (from the past 48 hour(s))  Basic metabolic panel     Status: Abnormal   Collection  Time: 07/28/15 12:10 PM  Result Value Ref Range   Sodium 140 135 - 145 mmol/L   Potassium 4.1 3.5 - 5.1 mmol/L   Chloride 107 101 - 111 mmol/L   CO2 24 22 - 32 mmol/L   Glucose, Bld 100 (H) 65 - 99 mg/dL   BUN 13 6 - 20 mg/dL   Creatinine, Ser 0.77 0.44 - 1.00 mg/dL   Calcium 9.2 8.9 - 10.3 mg/dL   GFR calc non Af Amer >60 >60 mL/min   GFR calc Af Amer >60 >60 mL/min    Comment: (NOTE) The eGFR has been calculated using the CKD EPI equation. This calculation has not been validated in all clinical situations. eGFR's persistently <60 mL/min signify possible Chronic Kidney Disease.    Anion gap 9 5 - 15  CBC with Differential     Status: Abnormal   Collection Time: 07/28/15 12:10 PM  Result Value Ref Range   WBC 12.2 (H) 4.0 - 10.5 K/uL   RBC 4.38 3.87 - 5.11 MIL/uL   Hemoglobin 11.3 (L) 12.0 - 15.0 g/dL   HCT 36.9 36.0 - 46.0 %   MCV 84.2 78.0 - 100.0 fL   MCH 25.8 (L) 26.0 - 34.0 pg   MCHC 30.6 30.0 - 36.0 g/dL  RDW 16.5 (H) 11.5 - 15.5 %   Platelets 355 150 - 400 K/uL   Neutrophils Relative % 61 %   Neutro Abs 7.5 1.7 - 7.7 K/uL   Lymphocytes Relative 27 %   Lymphs Abs 3.3 0.7 - 4.0 K/uL   Monocytes Relative 8 %   Monocytes Absolute 0.9 0.1 - 1.0 K/uL   Eosinophils Relative 4 %   Eosinophils Absolute 0.4 0.0 - 0.7 K/uL   Basophils Relative 0 %   Basophils Absolute 0.0 0.0 - 0.1 K/uL  Urinalysis, Routine w reflex microscopic     Status: Abnormal   Collection Time: 07/28/15 12:38 PM  Result Value Ref Range   Color, Urine YELLOW YELLOW   APPearance CLEAR CLEAR   Specific Gravity, Urine 1.015 1.005 - 1.030   pH 7.0 5.0 - 8.0   Glucose, UA NEGATIVE NEGATIVE mg/dL   Hgb urine dipstick MODERATE (A) NEGATIVE   Bilirubin Urine NEGATIVE NEGATIVE   Ketones, ur NEGATIVE NEGATIVE mg/dL   Protein, ur NEGATIVE NEGATIVE mg/dL   Nitrite NEGATIVE NEGATIVE   Leukocytes, UA MODERATE (A) NEGATIVE  Urine microscopic-add on     Status: Abnormal   Collection Time: 07/28/15 12:38 PM   Result Value Ref Range   Squamous Epithelial / LPF 0-5 (A) NONE SEEN   WBC, UA 6-30 0 - 5 WBC/hpf   RBC / HPF 6-30 0 - 5 RBC/hpf   Bacteria, UA FEW (A) NONE SEEN   Urine-Other MUCOUS PRESENT   Sedimentation rate     Status: Abnormal   Collection Time: 07/28/15  1:12 PM  Result Value Ref Range   Sed Rate 40 (H) 0 - 22 mm/hr    Dg Chest 1 View  07/28/2015  CLINICAL DATA:  Right leg swelling 3 weeks with known fracture. EXAM: CHEST 1 VIEW COMPARISON:  01/23/2015 FINDINGS: Lungs are hypoinflated with mild opacification over the left base and right midlung which may be due to atelectasis or infection. Cardiomediastinal silhouette is within normal. Remainder the exam is unchanged. IMPRESSION: Hypoinflation with mild opacification over the right midlung and left base which may be due to atelectasis or infection. Electronically Signed   By: Marin Olp M.D.   On: 07/28/2015 13:17   Dg Tibia/fibula Right  07/28/2015  CLINICAL DATA:  Known right lower leg fracture. Right lower extremity swelling 3 weeks. EXAM: RIGHT TIBIA AND FIBULA - 2 VIEW COMPARISON:  None. FINDINGS: There is diffuse decreased bone mineralization. Examination demonstrates a displaced oblique fracture of the distal tibial diaphysis with vertical nondisplaced extension to the mid diaphyseal region. There is approximately 1/2 shaft's with of lateral displacement of the distal fragment. There is a displaced oblique fracture of the distal fibular diametaphyseal region with approximately 1 shaft's with of medial displacement of the distal fragment. Remainder of the exam is within normal. IMPRESSION: Displaced distal tibia and fibular fractures as described. Electronically Signed   By: Marin Olp M.D.   On: 07/28/2015 13:15    ROS:  No recent f/c/n/v. PE:  Blood pressure 103/88, pulse 95, temperature 99.4 F (37.4 C), temperature source Rectal, resp. rate 18, SpO2 98 %. wn wd female in nad.  Alertness can't be assessed.  Non verbal.   EOMI.  resp unlabored.  R ankle with swelling and hyperemia.  Small abrasion laterally at the fibula.  No lymphadenopathy.  No active motion at the toes or ankle.  Sens to LT can't be assessed.  No signs of cellulitis.  Brisk cap refill at the  forefoot.    Assessment/Plan: R tibia and fibula fractures - Short leg splint.  NWB on R LE.  F/u with me in the office in two weeks.    [name] [name] 07/28/2015, 3:39 PM

## 2015-07-28 NOTE — ED Provider Notes (Signed)
CSN: [id number]     Arrival date & time 07/28/15  1057 History   First MD Initiated Contact with Patient 07/28/15 1116     Chief Complaint  Patient presents with  . Leg Pain   Low 5 caveat due to nonverbal status.  Patient is a 45 y.o. female presenting with leg pain. The history is provided by the patient.  Leg Pain Patient sent in for right lower leg fracture. Reportedly has had pain and swelling for the last few weeks but had an x-ray done recently that showed fracture. Sent in for further treatment. 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 She cannot participate in history.  Past Medical History  Diagnosis Date  . Dementia   . Restless leg syndrome   . Esophageal reflux   . UTI (urinary tract infection)   . Depression   . Pneumonia   . Constipation   . Aphagia   . Bacteremia   . Sepsis (HCC)   . Lenise ArenaCreutzfeldt Jakob disease   . Vitamin B12 deficiency   . Staphylococcus hominis sepsis (HCC) 01/24/2015  . UTI (urinary tract infection) due to Enterococcus 01/24/2015   Past Surgical History  Procedure Laterality Date  . Peg tube placement      [address] [place]  . Cholecystectomy     Family History  Problem Relation Age of Onset  . Family history unknown: Yes   Social History  Substance Use Topics  . Smoking status: Never Smoker   . Smokeless tobacco: None  . Alcohol Use: No   OB History    No data available     Review of Systems  Unable to perform ROS: Dementia      Allergies  Sulfa antibiotics  Home Medications   Prior to Admission medications   Medication Sig Start Date End Date Taking? Authorizing Provider  amoxicillin-clavulanate (AUGMENTIN) 875-125 MG tablet 1 tablet by PEG Tube route 2 (two) times daily. For 7 days. First dose was evening of 07/26/15.   Yes Historical Provider, MD  baclofen (LIORESAL) 10 MG tablet 10 mg by PEG Tube route 3 (three) times daily. 9am, 2pm, 9pm   Yes Historical Provider, MD   cetirizine (ZYRTEC) 10 MG tablet 10 mg by PEG Tube route daily.    Yes Historical Provider, MD  Cholecalciferol (VITAMIN D-3) 5000 UNITS TABS 5,000 Units by PEG Tube route 2 (two) times a week. On Monday and Saturday   Yes Historical Provider, MD  esomeprazole (NEXIUM) 40 MG packet 40 mg by PEG Tube route daily.   Yes Historical Provider, MD  guaiFENesin (ROBITUSSIN) 100 MG/5ML SOLN 400 mg by PEG Tube route every 6 (six) hours as needed (congestion).    Yes Historical Provider, MD  ipratropium-albuterol (DUONEB) 0.5-2.5 (3) MG/3ML SOLN Take 3 mLs by nebulization 4 (four) times daily - after meals and at bedtime. Last dose will be a.m. Of 07/29/15   Yes Historical Provider, MD  levofloxacin (LEVAQUIN) 750 MG tablet 750 mg by PEG Tube route daily. 07/27/15 thru 08/01/15   Yes Historical Provider, MD  meloxicam (MOBIC) 7.5 MG tablet 7.5 mg by PEG Tube route daily.    Yes Historical Provider, MD  morphine (ROXANOL) 20 MG/ML concentrated solution Place 0.25 mLs (5 mg total) into feeding tube every 2 (two) hours as needed (pain/ shortness of breath/ air hunger). 01/28/15  Yes Tasrif Ahmed, MD  Multiple Vitamin (MULTIVITAMIN) LIQD 5 mLs by PEG Tube route daily.   Yes Historical Provider, MD  polyethylene glycol (MIRALAX / GLYCOLAX) packet 17 g by PEG Tube route daily.    Yes Historical Provider, MD  PRESCRIPTION MEDICATION by PEG Tube route continuous. Enteral Feed order Two Cal HN 30 ml/hr   Yes Historical Provider, MD  rOPINIRole (REQUIP) 0.25 MG tablet 0.25 mg by PEG Tube route daily at 6 PM.    Yes Historical Provider, MD  Saccharomyces boulardii (FLORASTOR PO) 1 capsule by PEG Tube route daily.   Yes Historical Provider, MD  sucralfate (CARAFATE) 1 GM/10ML suspension 1 g by PEG Tube route 4 (four) times daily. 9am, 2pm, 4pm, 8pm   Yes Historical Provider, MD  vitamin B-12 (CYANOCOBALAMIN) 1000 MCG tablet 1,000 mcg by PEG Tube route daily.   Yes Historical Provider, MD  Water For Irrigation, Sterile (FREE  WATER) SOLN 175 mLs by PEG Tube route every 4 (four) hours.    Yes Historical Provider, MD  albuterol (PROVENTIL) (2.5 MG/3ML) 0.083% nebulizer solution Take 3 mLs (2.5 mg total) by nebulization 4 (four) times daily. Patient taking differently: Take 2.5 mg by nebulization every 6 (six) hours as needed for wheezing.  10/20/13   Christiane Ha, MD  bisacodyl (DULCOLAX) 10 MG suppository Place 10 mg rectally every other day as needed for moderate constipation.    Historical Provider, MD   BP 109/80 mmHg  Pulse 99  Temp(Src) 99.4 F (37.4 C) (Rectal)  Resp 18  SpO2 99% Physical Exam  Constitutional: She appears well-developed.  HENT:  Head: Atraumatic.  Cardiovascular: Normal rate.   Pulmonary/Chest: Effort normal.  Abdominal: Soft.  Musculoskeletal:  Extremities constricted. Tenderness and swelling to right lower leg and foot. There is a wound to the right ankle with a scab laterally. There is erythema and some warmth to the right lower leg. Some tenderness to right proximal fibular area.  Neurological:  Patient is nonverbal. She'll moan pain but will not follow commands. She is somewhat constricted on all her extremities.  Skin: Skin is warm.    ED Course  Procedures (including critical care time) Labs Review Labs Reviewed  BASIC METABOLIC PANEL - Abnormal; Notable for the following:    Glucose, Bld 100 (*)    All other components within normal limits  CBC WITH DIFFERENTIAL/PLATELET - Abnormal; Notable for the following:    WBC 12.2 (*)    Hemoglobin 11.3 (*)    MCH 25.8 (*)    RDW 16.5 (*)    All other components within normal limits  URINALYSIS, ROUTINE W REFLEX MICROSCOPIC (NOT AT [facility]) - Abnormal; Notable for the following:    Hgb urine dipstick MODERATE (*)    Leukocytes, UA MODERATE (*)    All other components within normal limits  SEDIMENTATION RATE - Abnormal; Notable for the following:    Sed Rate 40 (*)    All other components within normal limits  URINE  MICROSCOPIC-ADD ON - Abnormal; Notable for the following:    Squamous Epithelial / LPF 0-5 (*)    Bacteria, UA FEW (*)    All other components within normal limits  URINE CULTURE    Imaging Review Dg Chest 1 View  07/28/2015  CLINICAL DATA:  Right leg swelling 3 weeks with known fracture. EXAM: CHEST 1 VIEW COMPARISON:  01/23/2015 FINDINGS: Lungs are hypoinflated with mild opacification over the left base and right midlung which may be due to atelectasis or infection. Cardiomediastinal silhouette is within normal. Remainder the exam is unchanged. IMPRESSION: Hypoinflation with mild opacification over the right midlung and left base  which may be due to atelectasis or infection. Electronically Signed   By: Elberta Fortis M.D.   On: 07/28/2015 13:17   Dg Tibia/fibula Right  07/28/2015  CLINICAL DATA:  Known right lower leg fracture. Right lower extremity swelling 3 weeks. EXAM: RIGHT TIBIA AND FIBULA - 2 VIEW COMPARISON:  None. FINDINGS: There is diffuse decreased bone mineralization. Examination demonstrates a displaced oblique fracture of the distal tibial diaphysis with vertical nondisplaced extension to the mid diaphyseal region. There is approximately 1/2 shaft's with of lateral displacement of the distal fragment. There is a displaced oblique fracture of the distal fibular diametaphyseal region with approximately 1 shaft's with of medial displacement of the distal fragment. Remainder of the exam is within normal. IMPRESSION: Displaced distal tibia and fibular fractures as described. Electronically Signed   By: Elberta Fortis M.D.   On: 07/28/2015 13:15   I have personally reviewed and evaluated these images and lab results as part of my medical decision-making.   EKG Interpretation None      MDM   Final diagnoses:  Fracture of distal end of tibia with fibula, right, closed, initial encounter  UTI (lower urinary tract infection)    Patient sent in and is nonverbal. Has distal fibula and  fibular fracture. Also possible UTI and possible pneumonia. White count mildly elevated. No fever here. 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 Seen by or so in the ER. Splinted and follow-up. Wound on leg thought to be pressure will not open fracture. Admit to internal medicine.    Benjiman Core, MD 07/28/15 [phone]
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
## 2015-07-28 NOTE — Progress Notes (Signed)
Orthopedic Tech Progress Note Patient Details:  [name] Beitler 12/28/1970 [id number]  Ortho Devices Type of Ortho Device: Ace wrap, Post (short leg) splint, Stirrup splint Ortho Device/Splint Interventions: Application   Saul FordyceJennifer C Trigg Delarocha 07/28/2015, 5:14 PM

## 2015-07-29 ENCOUNTER — Encounter (HOSPITAL_COMMUNITY): Payer: Self-pay | Admitting: *Deleted

## 2015-07-29 DIAGNOSIS — Z960 Presence of urogenital implants: Secondary | ICD-10-CM

## 2015-07-29 DIAGNOSIS — K5900 Constipation, unspecified: Secondary | ICD-10-CM

## 2015-07-29 DIAGNOSIS — S82401A Unspecified fracture of shaft of right fibula, initial encounter for closed fracture: Secondary | ICD-10-CM | POA: Diagnosis not present

## 2015-07-29 DIAGNOSIS — F0280 Dementia in other diseases classified elsewhere without behavioral disturbance: Secondary | ICD-10-CM

## 2015-07-29 DIAGNOSIS — S82201A Unspecified fracture of shaft of right tibia, initial encounter for closed fracture: Secondary | ICD-10-CM | POA: Diagnosis not present

## 2015-07-29 DIAGNOSIS — Y92129 Unspecified place in nursing home as the place of occurrence of the external cause: Secondary | ICD-10-CM

## 2015-07-29 DIAGNOSIS — X58XXXA Exposure to other specified factors, initial encounter: Secondary | ICD-10-CM

## 2015-07-29 DIAGNOSIS — G3109 Other frontotemporal dementia: Secondary | ICD-10-CM

## 2015-07-29 DIAGNOSIS — S82409A Unspecified fracture of shaft of unspecified fibula, initial encounter for closed fracture: Secondary | ICD-10-CM | POA: Diagnosis not present

## 2015-07-29 DIAGNOSIS — Z9689 Presence of other specified functional implants: Secondary | ICD-10-CM

## 2015-07-29 LAB — GLUCOSE, CAPILLARY
GLUCOSE-CAPILLARY: 128 mg/dL — AB (ref 65–99)
GLUCOSE-CAPILLARY: 136 mg/dL — AB (ref 65–99)
Glucose-Capillary: 108 mg/dL — ABNORMAL HIGH (ref 65–99)
Glucose-Capillary: 117 mg/dL — ABNORMAL HIGH (ref 65–99)

## 2015-07-29 LAB — CBC
HCT: 36.5 % (ref 36.0–46.0)
Hemoglobin: 11.5 g/dL — ABNORMAL LOW (ref 12.0–15.0)
MCH: 25.7 pg — AB (ref 26.0–34.0)
MCHC: 31.5 g/dL (ref 30.0–36.0)
MCV: 81.7 fL (ref 78.0–100.0)
Platelets: 407 10*3/uL — ABNORMAL HIGH (ref 150–400)
RBC: 4.47 MIL/uL (ref 3.87–5.11)
RDW: 16.5 % — AB (ref 11.5–15.5)
WBC: 11.5 10*3/uL — AB (ref 4.0–10.5)

## 2015-07-29 LAB — URINE CULTURE: Culture: NO GROWTH

## 2015-07-29 MED ORDER — IPRATROPIUM-ALBUTEROL 0.5-2.5 (3) MG/3ML IN SOLN
3.0000 mL | Freq: Four times a day (QID) | RESPIRATORY_TRACT | Status: DC
  Administered 2015-07-29 (×2): 3 mL via RESPIRATORY_TRACT
  Filled 2015-07-29 (×2): qty 3

## 2015-07-29 MED ORDER — JEVITY 1.5 CAL/FIBER PO LIQD
1000.0000 mL | ORAL | Status: DC
  Administered 2015-07-29: 1000 mL
  Filled 2015-07-29 (×3): qty 1000

## 2015-07-29 MED ORDER — IPRATROPIUM-ALBUTEROL 0.5-2.5 (3) MG/3ML IN SOLN: 3.0000 mL | Freq: Four times a day (QID) | RESPIRATORY_TRACT | Status: DC | PRN
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
## 2015-07-29 NOTE — Progress Notes (Signed)
PT Cancellation Note  Patient Details Name: [name] SchmidtChevella Spinney MRN: [id number] DOB: 06/19/1970   Cancelled Treatment:    Reason Eval/Treat Not Completed: PT screened, no needs identified, will sign off, pt total assist for all mobility, could not tolerate any movement of RLE and multiple contractures noted.    Ebone Alcivar, [place] 07/29/2015, 1:03 PM

## 2015-07-29 NOTE — Progress Notes (Signed)
Subjective:  No acute events overnight. Per nursing, no acute events- pt was not moaning for pain.  Talked with his sister and gave an update about likely discharge today  Talked with social work- they will cal [facility] and attempt to get info on what happened. We tried to cal them yesterday but could not get in touch and could not leave voicemail.   Objective: Vital signs in last 24 hours: Filed Vitals:   07/28/15 1700 07/28/15 1931 07/29/15 0036 07/29/15 0354  BP:  111/77 121/76 112/79  Pulse:  97 69 98  Temp:  99 F (37.2 C) 98.6 F (37 C) 98.2 F (36.8 C)  TempSrc:  Axillary Axillary Axillary  Resp:  18 20 18   Weight: 136 lb 11 oz (62 kg)   139 lb 1.8 oz (63.1 kg)  SpO2:  100% 99% 100%   Weight change:   Intake/Output Summary (Last 24 hours) at 07/29/15 1052 Last data filed at 07/29/15 0356  Gross per 24 hour  Intake      0 ml  Output    500 ml  Net   -500 ml    General: Vital signs reviewed. Patient non communicative at baseline. Was sleeping Cardiovascular: regular rate, rhythm, no murmur appreciated  Pulmonary/Chest: Clear to auscultation bilaterally anteriorly Abdominal: Soft, non-tender, non-distended, BS +, has PEG Extremities: No lower extremity edema bilaterally, right leg splinted     Lab Results: Results for orders placed or performed during the hospital encounter of 07/28/15 (from the past 24 hour(s))  Basic metabolic panel     Status: Abnormal   Collection Time: 07/28/15 12:10 PM  Result Value Ref Range   Sodium 140 135 - 145 mmol/L   Potassium 4.1 3.5 - 5.1 mmol/L   Chloride 107 101 - 111 mmol/L   CO2 24 22 - 32 mmol/L   Glucose, Bld 100 (H) 65 - 99 mg/dL   BUN 13 6 - 20 mg/dL   Creatinine, Ser 9.510.77 0.44 - 1.00 mg/dL   Calcium 9.2 8.9 - 88.410.3 mg/dL   GFR calc non Af Amer >60 >60 mL/min   GFR calc Af Amer >60 >60 mL/min   Anion gap 9 5 - 15  CBC with Differential     Status: Abnormal   Collection Time: 07/28/15 12:10 PM  Result  Value Ref Range   WBC 12.2 (H) 4.0 - 10.5 K/uL   RBC 4.38 3.87 - 5.11 MIL/uL   Hemoglobin 11.3 (L) 12.0 - 15.0 g/dL   HCT 16.636.9 06.336.0 - 01.646.0 %   MCV 84.2 78.0 - 100.0 fL   MCH 25.8 (L) 26.0 - 34.0 pg   MCHC 30.6 30.0 - 36.0 g/dL   RDW 01.016.5 (H) 93.211.5 - 35.515.5 %   Platelets 355 150 - 400 K/uL   Neutrophils Relative % 61 %   Neutro Abs 7.5 1.7 - 7.7 K/uL   Lymphocytes Relative 27 %   Lymphs Abs 3.3 0.7 - 4.0 K/uL   Monocytes Relative 8 %   Monocytes Absolute 0.9 0.1 - 1.0 K/uL   Eosinophils Relative 4 %   Eosinophils Absolute 0.4 0.0 - 0.7 K/uL   Basophils Relative 0 %   Basophils Absolute 0.0 0.0 - 0.1 K/uL  Urinalysis, Routine w reflex microscopic     Status: Abnormal   Collection Time: 07/28/15 12:38 PM  Result Value Ref Range   Color, Urine YELLOW YELLOW   APPearance CLEAR CLEAR   Specific Gravity, Urine 1.015 1.005 - 1.030  pH 7.0 5.0 - 8.0   Glucose, UA NEGATIVE NEGATIVE mg/dL   Hgb urine dipstick MODERATE (A) NEGATIVE   Bilirubin Urine NEGATIVE NEGATIVE   Ketones, ur NEGATIVE NEGATIVE mg/dL   Protein, ur NEGATIVE NEGATIVE mg/dL   Nitrite NEGATIVE NEGATIVE   Leukocytes, UA MODERATE (A) NEGATIVE  Urine microscopic-add on     Status: Abnormal   Collection Time: 07/28/15 12:38 PM  Result Value Ref Range   Squamous Epithelial / LPF 0-5 (A) NONE SEEN   WBC, UA 6-30 0 - 5 WBC/hpf   RBC / HPF 6-30 0 - 5 RBC/hpf   Bacteria, UA FEW (A) NONE SEEN   Urine-Other MUCOUS PRESENT   Sedimentation rate     Status: Abnormal   Collection Time: 07/28/15  1:12 PM  Result Value Ref Range   Sed Rate 40 (H) 0 - 22 mm/hr  Procalcitonin - Baseline     Status: None   Collection Time: 07/28/15  9:48 PM  Result Value Ref Range   Procalcitonin 0.18 ng/mL  Glucose, capillary     Status: Abnormal   Collection Time: 07/29/15  3:51 AM  Result Value Ref Range   Glucose-Capillary 128 (H) 65 - 99 mg/dL  CBC     Status: Abnormal   Collection Time: 07/29/15  5:54 AM  Result Value Ref Range   WBC  11.5 (H) 4.0 - 10.5 K/uL   RBC 4.47 3.87 - 5.11 MIL/uL   Hemoglobin 11.5 (L) 12.0 - 15.0 g/dL   HCT 16.1 09.6 - 04.5 %   MCV 81.7 78.0 - 100.0 fL   MCH 25.7 (L) 26.0 - 34.0 pg   MCHC 31.5 30.0 - 36.0 g/dL   RDW 40.9 (H) 81.1 - 91.4 %   Platelets 407 (H) 150 - 400 K/uL  Glucose, capillary     Status: Abnormal   Collection Time: 07/29/15  9:03 AM  Result Value Ref Range   Glucose-Capillary 108 (H) 65 - 99 mg/dL    Micro Results: No results found for this or any previous visit (from the past 240 hour(s)). Studies/Results: Dg Chest 1 View  07/28/2015  CLINICAL DATA:  Right leg swelling 3 weeks with known fracture. EXAM: CHEST 1 VIEW COMPARISON:  01/23/2015 FINDINGS: Lungs are hypoinflated with mild opacification over the left base and right midlung which may be due to atelectasis or infection. Cardiomediastinal silhouette is within normal. Remainder the exam is unchanged. IMPRESSION: Hypoinflation with mild opacification over the right midlung and left base which may be due to atelectasis or infection. Electronically Signed   By: Elberta Fortis M.D.   On: 07/28/2015 13:17   Dg Tibia/fibula Right  07/28/2015  CLINICAL DATA:  Known right lower leg fracture. Right lower extremity swelling 3 weeks. EXAM: RIGHT TIBIA AND FIBULA - 2 VIEW COMPARISON:  None. FINDINGS: There is diffuse decreased bone mineralization. Examination demonstrates a displaced oblique fracture of the distal tibial diaphysis with vertical nondisplaced extension to the mid diaphyseal region. There is approximately 1/2 shaft's with of lateral displacement of the distal fragment. There is a displaced oblique fracture of the distal fibular diametaphyseal region with approximately 1 shaft's with of medial displacement of the distal fragment. Remainder of the exam is within normal. IMPRESSION: Displaced distal tibia and fibular fractures as described. Electronically Signed   By: Elberta Fortis M.D.   On: 07/28/2015 13:15   Medications: I  have reviewed the patient's current medications. Scheduled Meds: . baclofen  10 mg Per Tube TID  . [START  ON 07/31/2015] cholecalciferol  5,000 Units Per Tube Once per day on Mon Thu  . enoxaparin (LOVENOX) injection  40 mg Subcutaneous Q24H  . feeding supplement (OSMOLITE 1.2 CAL)  1,000 mL Per Tube Q24H  . ipratropium-albuterol  3 mL Nebulization Q6H  . loratadine  10 mg Per Tube Daily  . meloxicam  7.5 mg Per Tube Daily  . multivitamin  15 mL Per Tube Daily  . omeprazole  40 mg Per Tube Daily  . polyethylene glycol  17 g Per Tube Daily  . rOPINIRole  0.25 mg Per Tube q1800  . sucralfate  1 g Per Tube Q6H  . vitamin B-12  1,000 mcg Per Tube Daily   Continuous Infusions:  PRN Meds:.albuterol, bisacodyl Assessment/Plan: Active Problems:   Closed fracture of right tibia and fibula  Closed fracture of right tibia and fibula: Orthopedics consulted who recommended short leg splint. Nonweightbearing on Right leg and follow up in 2 weeks. No surgery  -follow up in 2 weeks outpatient -consulted social work to get further info on what happened at the site   "UTI" and 'pneumonia"- Procalcitonin negative. D/c abx. No signs or symptoms of infx. Afebrile. WC 11.5 Pt has chronic foley and likely is colonized.  -f/u ucx  Frontotemporal dementia- now has a PEG tube  -talked with palliative care-outpatient follow up  -Meloxicam 7.5 mg daily  -Baclofen 10 mg TID -Ropinirole 0.25 mg daily   Constipation -Dulcolax suppository  -Miralax   GI ppx -Nexium 40 mg daily  -Sucralfate   D/c today to SNF  Dispo: Disposition is deferred at this time, awaiting improvement of current medical problems.  Anticipated discharge in approximately 0 day(s).   The patient does have a current PCP [name], MD) and does need an [facility] hospital follow-up appointment after discharge.  The patient does not have transportation limitations that hinder transportation to clinic appointments.  .Services  Needed at time of discharge: Y = Yes, Blank = No PT:   OT:   RN:   Equipment:   Other:       Deneise Lever, MD 07/29/2015, 10:52 AM

## 2015-07-29 NOTE — Discharge Instructions (Signed)
Please follow up with orthopedics in 2 weeks for your leg

## 2015-07-29 NOTE — Clinical Social Work Placement (Signed)
   CLINICAL SOCIAL WORK PLACEMENT  NOTE  Date:  07/29/2015  Patient Details  Name: [name] [name] MRN: [id number] Date of Birth: 06/01/1970  Clinical Social Work is seeking post-discharge placement for this patient at the Skilled  Nursing Facility level of care (*CSW will initial, date and re-position this form in  chart as items are completed):  No   Patient/family provided with [place] [facility] Social Work Department's list of facilities offering this level of care within the geographic area requested by the patient (or if unable, by the patient's family).  Yes   Patient/family informed of their freedom to choose among providers that offer the needed level of care, that participate in Medicare, Medicaid or managed care program needed by the patient, have an available bed and are willing to accept the patient.  Yes   Patient/family informed of [facility] ownership interest in [facility] and [facility], as well as of the fact that they are under no obligation to receive care at these facilities.  PASRR submitted to EDS on       PASRR number received on       Existing PASRR number confirmed on       FL2 transmitted to all facilities in geographic area requested by pt/family on 07/29/15     FL2 transmitted to all facilities within larger geographic area on       Patient informed that his/her managed care company has contracts with or will negotiate with certain facilities, including the following:            Patient/family informed of bed offers received.  Patient chooses bed at       Physician recommends and patient chooses bed at      Patient to be transferred to   on  .  Patient to be transferred to facility by       Patient family notified on   of transfer.  Name of family member notified:        PHYSICIAN       Additional Comment:    _______________________________________________ Lebron QuamSimpson, [name] Schultes Amanda, LCSW 07/29/2015, 3:11 PM
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
## 2015-07-29 NOTE — [place] FL2 (Signed)
[place] MEDICAID FL2 LEVEL OF CARE SCREENING TOOL     IDENTIFICATION  Patient Name: [name] SchmidtChevella [name] Birthdate: 12/01/1970 Sex: female Admission Date (Current Location): 07/28/2015  [facility] [place]   Facility and Address:         Provider Number: [id number]  Attending Physician Name and Address:  Burns SpainElizabeth A Butcher, MD  Relative Name and Phone Number:       Current Level of Care:   Recommended Level of Care: Skilled Nursing Facility Prior Approval Number:    Date Approved/Denied:   PASRR Number:    Discharge Plan:      Current Diagnoses: Patient Active Problem List   Diagnosis Date Noted  . Closed fracture of right tibia and fibula 07/28/2015  . Staphylococcus hominis sepsis (HCC) 01/24/2015  . UTI (urinary tract infection) due to Enterococcus 01/24/2015  . Severe sepsis (HCC)   . Bacteremia 10/19/2013  . Wheezes 10/19/2013  . UTI (lower urinary tract infection) 10/17/2013  . Sinus tachycardia (HCC) 10/17/2013  . Dysphagia 10/17/2013  . PEG tube malfunction (HCC) 10/17/2013  . HCAP (healthcare-associated pneumonia) 03/12/2013  . SIRS (systemic inflammatory response syndrome) (HCC) 03/12/2013  . Hyperkalemia 03/12/2013  . Healthcare associated bacterial pneumonia 03/12/2013  . Frontotemporal dementia 02/07/2013  . Dysphasia 02/07/2013  . Constipation 02/07/2013    Orientation RESPIRATION BLADDER Height & Weight        Normal Indwelling catheter Weight: 139 lb 1.8 oz (63.1 kg) Height:     BEHAVIORAL SYMPTOMS/MOOD NEUROLOGICAL BOWEL NUTRITION STATUS      Incontinent Feeding tube  AMBULATORY STATUS COMMUNICATION OF NEEDS Skin   Total Care Does not communicate Surgical wounds                       Personal Care Assistance Level of Assistance              Functional Limitations Info             SPECIAL CARE FACTORS FREQUENCY                       Contractures      Additional Factors Info  Allergies               Current Medications (07/29/2015):  This is the current hospital active medication list Current Facility-Administered Medications  Medication Dose Route Frequency Provider Last Rate Last Dose  . albuterol (PROVENTIL) (2.5 MG/3ML) 0.083% nebulizer solution 2.5 mg  2.5 mg Nebulization Q6H PRN Lora PaulaJennifer T Krall, MD      . baclofen (LIORESAL) tablet 10 mg  10 mg Per Tube TID Lora PaulaJennifer T Krall, MD   10 mg at 07/29/15 1023  . bisacodyl (DULCOLAX) suppository 10 mg  10 mg Rectal Q48H PRN Lora PaulaJennifer T Krall, MD      . [name] [name][START ON 07/31/2015] cholecalciferol (D-VI-SOL) 400 UNIT/ML oral liquid 5,000 Units  5,000 Units Per Tube Once per day on Mon Thu Lora PaulaJennifer T Krall, MD      . enoxaparin (LOVENOX) injection 40 mg  40 mg Subcutaneous Q24H Lora PaulaJennifer T Krall, MD   40 mg at 07/28/15 2306  . feeding supplement (JEVITY 1.5 CAL/FIBER) liquid 1,000 mL  1,000 mL Per Tube Continuous Burns SpainElizabeth A Butcher, MD      . ipratropium-albuterol (DUONEB) 0.5-2.5 (3) MG/3ML nebulizer solution 3 mL  3 mL Nebulization Q6H Lora PaulaJennifer T Krall, MD   3 mL at 07/29/15 1402  . loratadine (CLARITIN) tablet 10  mg  10 mg Per Tube Daily Lora Paula, MD   10 mg at 07/29/15 1023  . meloxicam (MOBIC) tablet 7.5 mg  7.5 mg Per Tube Daily Lora Paula, MD   7.5 mg at 07/29/15 1023  . multivitamin liquid 15 mL  15 mL Per Tube Daily Burns Spain, MD   15 mL at 07/29/15 1029  . omeprazole (PRILOSEC) 2 mg/mL oral suspension SUSP 40 mg  40 mg Per Tube Daily Lora Paula, MD   40 mg at 07/29/15 1029  . polyethylene glycol (MIRALAX / GLYCOLAX) packet 17 g  17 g Per Tube Daily Lora Paula, MD   17 g at 07/29/15 1023  . rOPINIRole (REQUIP) tablet 0.25 mg  0.25 mg Per Tube [id number] Lora Paula, MD   0.25 mg at 07/28/15 2314  . sucralfate (CARAFATE) 1 GM/10ML suspension 1 g  1 g Per Tube Q6H Lora Paula, MD   1 g at 07/29/15 1023  . vitamin B-12 (CYANOCOBALAMIN) tablet 1,000 mcg  1,000 mcg Per Tube Daily Lora Paula, MD   1,000 mcg at 07/29/15 1023     Discharge Medications: Please see discharge summary for a list of discharge medications.  Relevant Imaging Results:  Relevant Lab Results:   Additional Information    [name] Hong Raynelle Highland, LCSW

## 2015-07-29 NOTE — Progress Notes (Signed)
Initial Nutrition Assessment  DOCUMENTATION CODES:  Not applicable  INTERVENTION:  Pt has Discharge orders in. Per chart, currently has Osmolite 1.2 infusing. This does not meet estimated requirements. If for whatever reason, d/c is not possible. Change to Jevity 1.5@ 40 cc/hr to provide 1440 kcal (100% of needs), 61 grams of protein, and 730 ml of H2O. Flush w/ 200 mls q 6 hrs  Her home regimen appears to be 2 cal HN @ 30 cc/hr. This would provide the same amount of calories and nearly same amount of protein.   NUTRITION DIAGNOSIS:  Increased nutrient needs related to acute injury/as evidenced by estimated nutritional requirements for this condition  GOAL:  Patient will meet greater than or equal to 90% of their needs  MONITOR:  TF tolerance, Labs  REASON FOR ASSESSMENT:  Consult Enteral/tube feeding initiation and management  ASSESSMENT:  44y/o female PMHx severe frontotemporal dementia (potential CJD) with related aphasia and inability to walk, s/p peg. Presents after a distal fibular fracture was found. Pt had also been recently diagnosed with PNA + UTI.   No family present on arrival. Pt may have slightly higher requirements then home TF regimen provides due to current injury and PNA/UTI, but not significantly so. Per wt history, pt seems to be very stable on home regimen  Stomach does feel distended, unsure when last bowel movement was.   NFPE: WDl  Labs reviewed:  WBC: 16.111.5- trend down   Recent Labs Lab 07/28/15 1210  NA 140  K 4.1  CL 107  CO2 24  BUN 13  CREATININE 0.77  CALCIUM 9.2  GLUCOSE 100*   Diet Order:  Diet NPO time specified Diet - low sodium heart healthy  Skin:Skin tear to ankle, dry skin  Last BM:  Unknown  Height:  Ht Readings from Last 1 Encounters:  01/21/15 5\' 3"  (1.6 m)   Weight:  Wt Readings from Last 1 Encounters:  07/29/15 139 lb 1.8 oz (63.1 kg)   Wt Readings from Last 10 Encounters:  07/29/15 139 lb 1.8 oz (63.1 kg)   01/29/15 136 lb 11 oz (62 kg)  10/18/13 146 lb 13.2 oz (66.6 kg)  03/12/13 138 lb 3.7 oz (62.7 kg)   Ideal Body Weight:  52.27 kg  BMI:  Body mass index is 24.65 kg/(m^2).  Estimated Nutritional Needs:  Kcal:  1400-1600 kcals (22-25 kcal/kg bw) Protein:  63-76 g (1-1.2 g/kg bw) Fluid:  1.4-1.6 liters  EDUCATION NEEDS:  No education needs identified at this time  Christophe LouisNathan Franks RD, LDN Clinical Nutrition Pager: [phone] 07/29/2015 12:33 PM

## 2015-07-29 NOTE — Progress Notes (Signed)
  Date: 07/29/2015  Patient name: [name] [name]  Medical record number: [id number]  Date of birth: 09/03/1970   I have seen and evaluated [name] Schmidthevella Sandall and discussed their care with the Residency Team. Ms Nicholas LoseLomax is unable to talk 2/2 frontotemporal dementia so all information obtained from Dr Johnny BridgeSaraiya or the chart. The pt had swelling of her R leg 3 weeks ago. The NP performed an Xray 2 days ago which showed a displaced tib / fib fracture. 2 days ago the NP also started levaquin and augmentin for UTI and a pneumonia. Ortho was eval the pt and recommended no surgical tx and a short leg splint.  The pt is nonverbal, nonambulatory, and has sig contractures of all ext. This is her baseline. Dr Johnny BridgeSaraiya was not able to speak to someone at the facility to determine how this fx occurred.   PMHx, Fam Hx, and/or Soc Hx : she resides at a SNF and has a chronic foley and feeding tube. Otherwise, unable to obtain 2/2 dementia.   Filed Vitals:   07/29/15 0036 07/29/15 0354  BP: 121/76 112/79  Pulse: 69 98  Temp: 98.6 F (37 C) 98.2 F (36.8 C)  Resp: 20 18   Eyes open but no response to voice or touch Contractures of all extremities HRRR L clear anteriorly ABD + BS Cast to RLE, toes warm.  I personally viewed his CXR images and confirmed by reading with the official read. Hypoinflated, portable, no infiltrate  PCT 0.18 WBC 11.5 HgB 11.5 UA mod LE, mod HgB, 6-30 RBC, 6-30 WBC  Assessment and Plan: I have seen and evaluated the patient as outlined above. I agree with the formulated Assessment and Plan as detailed in the residents' note, with the following changes:   1. Displaced R tib / fib fracture - the circumstances that lead to this non ambulatory woman having a fracture is not clear so Dr Kathie RhodesS has consulted social work to get more details. Her plain film does shoe decreased mineralization bu this cannot establish the dx of osteoporosis. She had her ovaries in 2015, is only 44. But she has been  non ambulatory since 2013 per notes. It seems doubtful that she has such sig osteoporosis that she sustained a fracture by just repositioned her contractured limbs. Her pain seems well controlled and once the SNF situation is settled, she will be stable for transfer back to SNF.   Burns SpainElizabeth A Butcher, MD 5/27/201711:07 AM

## 2015-07-30 DIAGNOSIS — S82201A Unspecified fracture of shaft of right tibia, initial encounter for closed fracture: Secondary | ICD-10-CM | POA: Diagnosis not present

## 2015-07-30 DIAGNOSIS — X58XXXA Exposure to other specified factors, initial encounter: Secondary | ICD-10-CM | POA: Diagnosis not present

## 2015-07-30 DIAGNOSIS — S82409A Unspecified fracture of shaft of unspecified fibula, initial encounter for closed fracture: Secondary | ICD-10-CM | POA: Diagnosis not present

## 2015-07-30 DIAGNOSIS — S82401A Unspecified fracture of shaft of right fibula, initial encounter for closed fracture: Secondary | ICD-10-CM | POA: Diagnosis not present

## 2015-07-30 DIAGNOSIS — Y92129 Unspecified place in nursing home as the place of occurrence of the external cause: Secondary | ICD-10-CM | POA: Diagnosis not present

## 2015-07-30 LAB — GLUCOSE, CAPILLARY
GLUCOSE-CAPILLARY: 122 mg/dL — AB (ref 65–99)
Glucose-Capillary: 125 mg/dL — ABNORMAL HIGH (ref 65–99)

## 2015-07-30 LAB — PROCALCITONIN: Procalcitonin: <0.1 ng/mL

## 2015-07-30 LAB — MRSA PCR SCREENING: MRSA by PCR: NEGATIVE

## 2015-07-30 MED ORDER — MORPHINE SULFATE (CONCENTRATE) 20 MG/ML PO SOLN: 5.0000 mg | ORAL | Status: DC | PRN

## 2015-07-30 MED ORDER — MORPHINE SULFATE 10 MG/5ML PO SOLN
2.5000 mg | ORAL | Status: DC | PRN
  Administered 2015-07-30: 2.5 mg via ORAL
  Filled 2015-07-30: qty 2

## 2015-07-30 NOTE — Progress Notes (Signed)
  Date: 07/30/2015  Patient name: [name] [name]  Medical record number: [id number]  Date of birth: 10/31/1970   This patient has been seen and the plan of care was discussed with the house staff. Please see their note for complete details. I concur with their findings with the following additions/corrections: [name] [name] was vocalizing with each exhalation. As I was leaving she did moan. Her sister has said that moaning is her way of conveying she is in pain. Dr Isabella BowensKrall had resumed MSO4 this AM. I spoke to RN to admin one dose. For new SNF placement.  Burns SpainElizabeth A Destenie Ingber, MD 07/30/2015, 1:00 PM

## 2015-07-30 NOTE — Progress Notes (Signed)
Pt discharge education completed. [facility] to transport pt to disposition. Report called off to nurse Summer at the facility. Pt IV removed; foley remains clean and intact at discharge. Pt cleaned and new gown placed on pt. Pt pre-medicated prior to discharge. Pt transported off unit via stretcher with belongings to the side. Dionne BucyP. Amo Sindi Beckworth RN

## 2015-07-30 NOTE — Clinical Social Work Note (Signed)
Patient's sister, [name] agreeable to patient [facility]. CSW discussed possible SNF options and process for LTC placement. [facility]'s [facility]. No current bed offers for LTC placement.   CSW encouraged patient's sister to contact [facility] with concerns/questions transferring patient to alternate facility. CSW also encouraged pt's sister to research and tour local facilities. Pt's sister reported that currently she is on vacation and will contact facility at later date with concerns and inquiry to new facility.   CSW has emailed SNF list to pt's sister as she is not familiar with SNF's in the area. Pt's sister has instructed CSW to facilitate [facility]. No further concerns reported at this time.   Clinical Social Worker facilitated patient discharge including contacting patient family and facility to confirm patient discharge plans.  Clinical information faxed to facility and family agreeable with plan.  CSW arranged [facility].  RN to call report prior to discharge.  Clinical Social Worker will sign off for now as social work intervention is no longer needed. Please consult us again if new need arises.  Derenda FennelBashira Kresha Abelson, MSW, LCSWA [phone] 07/30/2015 1:49 PM   Derenda FennelBashira Aniko Finnigan, MSW, LCSWA [phone] 07/30/2015 1:46 PM

## 2015-07-30 NOTE — Clinical Social Work Note (Signed)
Clinical Social Worker has made several attempts to contact patient's sister, [name] and Elease Hashimotoatricia in regards to discharge planning. CSW has left massages for returned phone call. No family at bedside.   CSW remains available as needed.   Derenda FennelBashira Garlan Drewes, MSW, LCSWA [phone] 07/30/2015 12:37 PM
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
## 2015-07-30 NOTE — Progress Notes (Signed)
Subjective: Some moaning this morning.  Objective: Vital signs in last 24 hours: Filed Vitals:   07/29/15 1404 07/29/15 1436 07/29/15 1959 07/30/15 0339  BP:  119/81 139/85 131/90  Pulse:  104 104 106  Temp:  98.7 F (37.1 C) 97.5 F (36.4 C) 97.8 F (36.6 C)  TempSrc:  Axillary Axillary Axillary  Resp:  17 18 18   Weight:    146 lb 12.8 oz (66.588 kg)  SpO2: 100% 99% 99% 99%   Weight change: 10 lb 1.8 oz (4.588 kg)  Intake/Output Summary (Last 24 hours) at 07/30/15 [id number] Last data filed at 07/30/15 [id number]  Gross per 24 hour  Intake    730 ml  Output    850 ml  Net   -120 ml   General Apperance: NAD HEENT: Normocephalic, atraumatic, anicteric sclera Neck: Supple, trachea midline Lungs: Clear to auscultation bilaterally. No wheezes, rhonchi or rales. Breathing comfortably Heart: Regular rate and rhythm, no murmur/rub/gallop Abdomen: Soft, nontender, nondistended, no rebound/guarding Extremities: Warm and well perfused, no edema Skin: No rashes or lesions Neurologic: Awake but not interactive. No gross deficits.  Lab Results: Basic Metabolic Panel:  Recent Labs Lab 07/28/15 1210  NA 140  K 4.1  CL 107  CO2 24  GLUCOSE 100*  BUN 13  CREATININE 0.77  CALCIUM 9.2   CBC:  Recent Labs Lab 07/28/15 1210 07/29/15 0554  WBC 12.2* 11.5*  NEUTROABS 7.5  --   HGB 11.3* 11.5*  HCT 36.9 36.5  MCV 84.2 81.7  PLT 355 407*   CBG:  Recent Labs Lab 07/29/15 0351 07/29/15 0903 07/29/15 1128 07/29/15 2106 07/30/15 0547  GLUCAP 128* 108* 117* 136* 122*   Urinalysis:  Recent Labs Lab 07/28/15 1238  COLORURINE YELLOW  LABSPEC 1.015  PHURINE 7.0  GLUCOSEU NEGATIVE  HGBUR MODERATE*  BILIRUBINUR NEGATIVE  KETONESUR NEGATIVE  PROTEINUR NEGATIVE  NITRITE NEGATIVE  LEUKOCYTESUR MODERATE*   Micro Results: Recent Results (from the past 240 hour(s))  Urine culture     Status: None   Collection Time: 07/28/15 12:38 PM  Result Value Ref Range Status   Specimen Description URINE, CATHETERIZED  Final   Special Requests NONE  Final   Culture NO GROWTH  Final   Report Status 07/29/2015 FINAL  Final   Studies/Results: Dg Chest 1 View  07/28/2015  CLINICAL DATA:  Right leg swelling 3 weeks with known fracture. EXAM: CHEST 1 VIEW COMPARISON:  01/23/2015 FINDINGS: Lungs are hypoinflated with mild opacification over the left base and right midlung which may be due to atelectasis or infection. Cardiomediastinal silhouette is within normal. Remainder the exam is unchanged. IMPRESSION: Hypoinflation with mild opacification over the right midlung and left base which may be due to atelectasis or infection. Electronically Signed   By: Elberta Fortisaniel  Boyle M.D.   On: 07/28/2015 13:17   Dg Tibia/fibula Right  07/28/2015  CLINICAL DATA:  Known right lower leg fracture. Right lower extremity swelling 3 weeks. EXAM: RIGHT TIBIA AND FIBULA - 2 VIEW COMPARISON:  None. FINDINGS: There is diffuse decreased bone mineralization. Examination demonstrates a displaced oblique fracture of the distal tibial diaphysis with vertical nondisplaced extension to the mid diaphyseal region. There is approximately 1/2 shaft's with of lateral displacement of the distal fragment. There is a displaced oblique fracture of the distal fibular diametaphyseal region with approximately 1 shaft's with of medial displacement of the distal fragment. Remainder of the exam is within normal. IMPRESSION: Displaced distal tibia and fibular fractures as described. Electronically Signed  By: Elberta Fortis M.D.   On: 07/28/2015 13:15   Medications: I have reviewed the patient's current medications. Scheduled Meds: . baclofen  10 mg Per Tube TID  . [START ON 07/31/2015] cholecalciferol  5,000 Units Per Tube Once per day on Mon Thu  . enoxaparin (LOVENOX) injection  40 mg Subcutaneous Q24H  . loratadine  10 mg Per Tube Daily  . meloxicam  7.5 mg Per Tube Daily  . multivitamin  15 mL Per Tube Daily  . omeprazole   40 mg Per Tube Daily  . polyethylene glycol  17 g Per Tube Daily  . rOPINIRole  0.25 mg Per Tube q1800  . sucralfate  1 g Per Tube Q6H  . vitamin B-12  1,000 mcg Per Tube Daily   Continuous Infusions: . feeding supplement (JEVITY 1.5 CAL/FIBER) 1,000 mL (07/29/15 1514)   PRN Meds:.albuterol, bisacodyl, ipratropium-albuterol Assessment/Plan: 45 year old woman with frontotemporal dementia, chronic indwelling foley presenting after fracture.  Closed fracture of right tibia and fibula: Orthopedics consulted who recommended leg splint. Nonweightbearing on right leg and follow up in 2 weeks. No surgery at this time. -Baclofen TID -Mobic daily -Restart morphine per tube prn pain  Leukocytosis: She was on Levaquin and Augmentin for UTI and aspiration pneumonia at her facility. CXR here with question of either atalectasis versus infilatrate. Urinalysis was unremarkable. Urine culture had no growth. Procalcitonin unremarkable. Remains afebrile  Frontotemporal dementia: Recommend outpatient palliative care follow up at facility for symptom management and further discussions of goals of care. -Appreciate nutrition recs: Jevity 1.5@ 40 cc/hr to provide 1440 kcal (100% of needs), 61 grams of protein, and 730 ml of H2O. Flush w/ 200 mls q 6 hrs.  Dispo: Awaiting SNF  The patient does have a current PCP [name], MD) and does not need an [facility] hospital follow-up appointment after discharge.  The patient does not have transportation limitations that hinder transportation to clinic appointments.  .Services Needed at time of discharge: Y = Yes, Blank = No PT:   OT:   RN:   Equipment:   Other:       Lora Paula, MD 07/30/2015, 7:52 AM

## 2015-08-01 LAB — VITAMIN D 25 HYDROXY (VIT D DEFICIENCY, FRACTURES): VIT D 25 HYDROXY: 39.9 ng/mL (ref 30.0–100.0)

## 2016-02-22 ENCOUNTER — Encounter (HOSPITAL_COMMUNITY): Payer: Self-pay | Admitting: Emergency Medicine

## 2016-02-22 ENCOUNTER — Emergency Department (HOSPITAL_COMMUNITY): Payer: Medicare Other

## 2016-02-22 ENCOUNTER — Inpatient Hospital Stay (HOSPITAL_COMMUNITY): Payer: Medicare Other

## 2016-02-22 ENCOUNTER — Inpatient Hospital Stay (HOSPITAL_COMMUNITY)
Admission: EM | Admit: 2016-02-22 | Discharge: 2016-03-24 | DRG: 003 | Disposition: A | Discharge status: Skilled Nursing Facility | Payer: Medicare Other | Attending: Internal Medicine | Admitting: Internal Medicine

## 2016-02-22 DIAGNOSIS — Z1612 Extended spectrum beta lactamase (ESBL) resistance: Secondary | ICD-10-CM | POA: Diagnosis not present

## 2016-02-22 DIAGNOSIS — J9811 Atelectasis: Secondary | ICD-10-CM | POA: Diagnosis present

## 2016-02-22 DIAGNOSIS — J9622 Acute and chronic respiratory failure with hypercapnia: Secondary | ICD-10-CM | POA: Diagnosis present

## 2016-02-22 DIAGNOSIS — N179 Acute kidney failure, unspecified: Secondary | ICD-10-CM | POA: Diagnosis not present

## 2016-02-22 DIAGNOSIS — R6521 Severe sepsis with septic shock: Secondary | ICD-10-CM | POA: Diagnosis present

## 2016-02-22 DIAGNOSIS — D638 Anemia in other chronic diseases classified elsewhere: Secondary | ICD-10-CM | POA: Diagnosis present

## 2016-02-22 DIAGNOSIS — F0390 Unspecified dementia without behavioral disturbance: Secondary | ICD-10-CM

## 2016-02-22 DIAGNOSIS — A419 Sepsis, unspecified organism: Secondary | ICD-10-CM | POA: Diagnosis present

## 2016-02-22 DIAGNOSIS — J189 Pneumonia, unspecified organism: Secondary | ICD-10-CM | POA: Diagnosis present

## 2016-02-22 DIAGNOSIS — G3109 Other frontotemporal dementia: Secondary | ICD-10-CM | POA: Diagnosis present

## 2016-02-22 DIAGNOSIS — X58XXXA Exposure to other specified factors, initial encounter: Secondary | ICD-10-CM | POA: Diagnosis present

## 2016-02-22 DIAGNOSIS — F329 Major depressive disorder, single episode, unspecified: Secondary | ICD-10-CM | POA: Diagnosis present

## 2016-02-22 DIAGNOSIS — K219 Gastro-esophageal reflux disease without esophagitis: Secondary | ICD-10-CM | POA: Diagnosis present

## 2016-02-22 DIAGNOSIS — Y95 Nosocomial condition: Secondary | ICD-10-CM | POA: Diagnosis present

## 2016-02-22 DIAGNOSIS — A0472 Enterocolitis due to Clostridium difficile, not specified as recurrent: Secondary | ICD-10-CM | POA: Diagnosis present

## 2016-02-22 DIAGNOSIS — R739 Hyperglycemia, unspecified: Secondary | ICD-10-CM | POA: Diagnosis not present

## 2016-02-22 DIAGNOSIS — J9611 Chronic respiratory failure with hypoxia: Secondary | ICD-10-CM | POA: Diagnosis not present

## 2016-02-22 DIAGNOSIS — Z7189 Other specified counseling: Secondary | ICD-10-CM | POA: Diagnosis not present

## 2016-02-22 DIAGNOSIS — G2581 Restless legs syndrome: Secondary | ICD-10-CM | POA: Diagnosis present

## 2016-02-22 DIAGNOSIS — T17890A Other foreign object in other parts of respiratory tract causing asphyxiation, initial encounter: Secondary | ICD-10-CM | POA: Diagnosis present

## 2016-02-22 DIAGNOSIS — F0150 Vascular dementia without behavioral disturbance: Secondary | ICD-10-CM | POA: Diagnosis not present

## 2016-02-22 DIAGNOSIS — R4701 Aphasia: Secondary | ICD-10-CM | POA: Diagnosis present

## 2016-02-22 DIAGNOSIS — J9601 Acute respiratory failure with hypoxia: Secondary | ICD-10-CM

## 2016-02-22 DIAGNOSIS — J151 Pneumonia due to Pseudomonas: Principal | ICD-10-CM | POA: Diagnosis present

## 2016-02-22 DIAGNOSIS — N3000 Acute cystitis without hematuria: Secondary | ICD-10-CM | POA: Diagnosis not present

## 2016-02-22 DIAGNOSIS — F0280 Dementia in other diseases classified elsewhere without behavioral disturbance: Secondary | ICD-10-CM | POA: Diagnosis present

## 2016-02-22 DIAGNOSIS — E870 Hyperosmolality and hypernatremia: Secondary | ICD-10-CM | POA: Diagnosis not present

## 2016-02-22 DIAGNOSIS — Z978 Presence of other specified devices: Secondary | ICD-10-CM

## 2016-02-22 DIAGNOSIS — Z9911 Dependence on respirator [ventilator] status: Secondary | ICD-10-CM | POA: Diagnosis not present

## 2016-02-22 DIAGNOSIS — Z931 Gastrostomy status: Secondary | ICD-10-CM

## 2016-02-22 DIAGNOSIS — A8100 Creutzfeldt-Jakob disease, unspecified: Secondary | ICD-10-CM | POA: Diagnosis present

## 2016-02-22 DIAGNOSIS — J9602 Acute respiratory failure with hypercapnia: Secondary | ICD-10-CM | POA: Diagnosis not present

## 2016-02-22 DIAGNOSIS — J9621 Acute and chronic respiratory failure with hypoxia: Secondary | ICD-10-CM | POA: Diagnosis present

## 2016-02-22 DIAGNOSIS — J9600 Acute respiratory failure, unspecified whether with hypoxia or hypercapnia: Secondary | ICD-10-CM | POA: Diagnosis not present

## 2016-02-22 DIAGNOSIS — A0819 Acute gastroenteropathy due to other small round viruses: Secondary | ICD-10-CM | POA: Diagnosis present

## 2016-02-22 DIAGNOSIS — N390 Urinary tract infection, site not specified: Secondary | ICD-10-CM | POA: Diagnosis not present

## 2016-02-22 DIAGNOSIS — E876 Hypokalemia: Secondary | ICD-10-CM | POA: Diagnosis present

## 2016-02-22 DIAGNOSIS — Z7401 Bed confinement status: Secondary | ICD-10-CM | POA: Diagnosis not present

## 2016-02-22 DIAGNOSIS — R4182 Altered mental status, unspecified: Secondary | ICD-10-CM | POA: Diagnosis present

## 2016-02-22 DIAGNOSIS — Z79899 Other long term (current) drug therapy: Secondary | ICD-10-CM

## 2016-02-22 DIAGNOSIS — L8990 Pressure ulcer of unspecified site, unspecified stage: Secondary | ICD-10-CM | POA: Insufficient documentation

## 2016-02-22 DIAGNOSIS — Z515 Encounter for palliative care: Secondary | ICD-10-CM | POA: Diagnosis present

## 2016-02-22 DIAGNOSIS — Z792 Long term (current) use of antibiotics: Secondary | ICD-10-CM

## 2016-02-22 DIAGNOSIS — F0281 Dementia in other diseases classified elsewhere with behavioral disturbance: Secondary | ICD-10-CM | POA: Diagnosis not present

## 2016-02-22 DIAGNOSIS — E872 Acidosis: Secondary | ICD-10-CM | POA: Diagnosis present

## 2016-02-22 LAB — GASTROINTESTINAL PANEL BY PCR, STOOL (REPLACES STOOL CULTURE)
Adenovirus F40/41: NOT DETECTED
Astrovirus: NOT DETECTED
CRYPTOSPORIDIUM: NOT DETECTED
Campylobacter species: NOT DETECTED
Cyclospora cayetanensis: NOT DETECTED
ENTAMOEBA HISTOLYTICA: NOT DETECTED
Enteroaggregative E coli (EAEC): NOT DETECTED
Enteropathogenic E coli (EPEC): NOT DETECTED
Enterotoxigenic E coli (ETEC): NOT DETECTED
Giardia lamblia: NOT DETECTED
NOROVIRUS GI/GII: DETECTED — AB
Plesimonas shigelloides: NOT DETECTED
ROTAVIRUS A: NOT DETECTED
SALMONELLA SPECIES: NOT DETECTED
SAPOVIRUS (I, II, IV, AND V): NOT DETECTED
SHIGA LIKE TOXIN PRODUCING E COLI (STEC): NOT DETECTED
SHIGELLA/ENTEROINVASIVE E COLI (EIEC): NOT DETECTED
VIBRIO CHOLERAE: NOT DETECTED
Vibrio species: NOT DETECTED
YERSINIA ENTEROCOLITICA: NOT DETECTED

## 2016-02-22 LAB — URINALYSIS, ROUTINE W REFLEX MICROSCOPIC
BACTERIA UA: NONE SEEN
BILIRUBIN URINE: NEGATIVE
Glucose, UA: 50 mg/dL — AB
KETONES UR: NEGATIVE mg/dL
LEUKOCYTES UA: NEGATIVE
NITRITE: NEGATIVE
PROTEIN: NEGATIVE mg/dL
Specific Gravity, Urine: 1.005 (ref 1.005–1.030)
Squamous Epithelial / LPF: NONE SEEN
pH: 5 (ref 5.0–8.0)

## 2016-02-22 LAB — PHOSPHORUS
Phosphorus: 2.3 mg/dL — ABNORMAL LOW (ref 2.5–4.6)
Phosphorus: 1.3 mg/dL — ABNORMAL LOW (ref 2.5–4.6)

## 2016-02-22 LAB — COMPREHENSIVE METABOLIC PANEL
ALK PHOS: 120 U/L (ref 38–126)
ALT: 40 U/L (ref 14–54)
AST: 36 U/L (ref 15–41)
Albumin: 3.5 g/dL (ref 3.5–5.0)
Anion gap: 5 (ref 5–15)
BILIRUBIN TOTAL: 0.5 mg/dL (ref 0.3–1.2)
BUN: 18 mg/dL (ref 6–20)
CALCIUM: 8.6 mg/dL — AB (ref 8.9–10.3)
CO2: 32 mmol/L (ref 22–32)
Chloride: 102 mmol/L (ref 101–111)
Creatinine, Ser: 0.71 mg/dL (ref 0.44–1.00)
GFR calc Af Amer: >60 mL/min (ref >60)
GLUCOSE: 273 mg/dL — AB (ref 65–99)
POTASSIUM: 4.8 mmol/L (ref 3.5–5.1)
Sodium: 139 mmol/L (ref 135–145)
TOTAL PROTEIN: 7.6 g/dL (ref 6.5–8.1)

## 2016-02-22 LAB — GLUCOSE, CAPILLARY
GLUCOSE-CAPILLARY: 113 mg/dL — AB (ref 65–99)
GLUCOSE-CAPILLARY: 102 mg/dL — AB (ref 65–99)
Glucose-Capillary: 75 mg/dL (ref 65–99)
Glucose-Capillary: 107 mg/dL — ABNORMAL HIGH (ref 65–99)

## 2016-02-22 LAB — CBC WITH DIFFERENTIAL/PLATELET
BASOS ABS: 0.1 10*3/uL (ref 0.0–0.1)
BASOS PCT: 0 %
Eosinophils Absolute: 0.2 10*3/uL (ref 0.0–0.7)
Eosinophils Relative: 1 %
HCT: 43.6 % (ref 36.0–46.0)
Hemoglobin: 13.4 g/dL (ref 12.0–15.0)
LYMPHS PCT: 20 %
Lymphs Abs: 3.1 10*3/uL (ref 0.7–4.0)
MCH: 26.2 pg (ref 26.0–34.0)
MCHC: 30.7 g/dL (ref 30.0–36.0)
MCV: 85.3 fL (ref 78.0–100.0)
MONO ABS: 0.6 10*3/uL (ref 0.1–1.0)
Monocytes Relative: 4 %
NEUTROS ABS: 11.7 10*3/uL — AB (ref 1.7–7.7)
Neutrophils Relative %: 75 %
PLATELETS: 339 10*3/uL (ref 150–400)
RBC: 5.11 MIL/uL (ref 3.87–5.11)
RDW: 16.4 % — AB (ref 11.5–15.5)
WBC: 15.6 10*3/uL — AB (ref 4.0–10.5)

## 2016-02-22 LAB — I-STAT CHEM 8, ED
BUN: 23 mg/dL — AB (ref 6–20)
CHLORIDE: 99 mmol/L — AB (ref 101–111)
Calcium, Ion: 1.18 mmol/L (ref 1.15–1.40)
Creatinine, Ser: 0.7 mg/dL (ref 0.44–1.00)
Glucose, Bld: 289 mg/dL — ABNORMAL HIGH (ref 65–99)
HCT: 46 % (ref 36.0–46.0)
Hemoglobin: 15.6 g/dL — ABNORMAL HIGH (ref 12.0–15.0)
Potassium: 4.7 mmol/L (ref 3.5–5.1)
SODIUM: 139 mmol/L (ref 135–145)
TCO2: 30 mmol/L (ref 0–100)

## 2016-02-22 LAB — RESPIRATORY PANEL BY PCR
Adenovirus: NOT DETECTED
BORDETELLA PERTUSSIS-RVPCR: NOT DETECTED
CORONAVIRUS HKU1-RVPPCR: NOT DETECTED
Chlamydophila pneumoniae: NOT DETECTED
Coronavirus 229E: NOT DETECTED
Coronavirus NL63: NOT DETECTED
Coronavirus OC43: NOT DETECTED
Influenza A: NOT DETECTED
Influenza B: NOT DETECTED
METAPNEUMOVIRUS-RVPPCR: NOT DETECTED
Mycoplasma pneumoniae: NOT DETECTED
PARAINFLUENZA VIRUS 2-RVPPCR: NOT DETECTED
PARAINFLUENZA VIRUS 3-RVPPCR: NOT DETECTED
Parainfluenza Virus 1: NOT DETECTED
Parainfluenza Virus 4: NOT DETECTED
RESPIRATORY SYNCYTIAL VIRUS-RVPPCR: NOT DETECTED
RHINOVIRUS / ENTEROVIRUS - RVPPCR: NOT DETECTED

## 2016-02-22 LAB — I-STAT TROPONIN, ED: Troponin i, poc: 0.02 ng/mL (ref 0.00–0.08)

## 2016-02-22 LAB — TROPONIN I
Troponin I: 0.04 ng/mL (ref <0.03)
Troponin I: <0.03 ng/mL (ref <0.03)

## 2016-02-22 LAB — BRAIN NATRIURETIC PEPTIDE
B NATRIURETIC PEPTIDE 5: 75.3 pg/mL (ref 0.0–100.0)
B NATRIURETIC PEPTIDE 5: 96.5 pg/mL (ref 0.0–100.0)

## 2016-02-22 LAB — POCT I-STAT 3, VENOUS BLOOD GAS (G3P V)
ACID-BASE DEFICIT: 3 mmol/L — AB (ref 0.0–2.0)
Bicarbonate: 24.8 mmol/L (ref 20.0–28.0)
O2 Saturation: 23 %
TCO2: 27 mmol/L (ref 0–100)
pCO2, Ven: 56 mmHg (ref 44.0–60.0)
pH, Ven: 7.256 (ref 7.250–7.430)
pO2, Ven: 19 mmHg — CL (ref 32.0–45.0)

## 2016-02-22 LAB — I-STAT ARTERIAL BLOOD GAS, ED
Acid-base deficit: 4 mmol/L — ABNORMAL HIGH (ref 0.0–2.0)
BICARBONATE: 27.5 mmol/L (ref 20.0–28.0)
O2 Saturation: 94 %
PO2 ART: 103 mmHg (ref 83.0–108.0)
Patient temperature: 101
TCO2: 30 mmol/L (ref 0–100)
pCO2 arterial: 88.8 mmHg (ref 32.0–48.0)
pH, Arterial: 7.107 — CL (ref 7.350–7.450)

## 2016-02-22 LAB — C DIFFICILE QUICK SCREEN W PCR REFLEX
C DIFFICILE (CDIFF) TOXIN: NEGATIVE
C Diff antigen: POSITIVE — AB

## 2016-02-22 LAB — MRSA PCR SCREENING: MRSA by PCR: NEGATIVE

## 2016-02-22 LAB — STREP PNEUMONIAE URINARY ANTIGEN: STREP PNEUMO URINARY ANTIGEN: NEGATIVE

## 2016-02-22 LAB — MAGNESIUM
Magnesium: 1.9 mg/dL (ref 1.7–2.4)
Magnesium: 1.5 mg/dL — ABNORMAL LOW (ref 1.7–2.4)

## 2016-02-22 LAB — INFLUENZA PANEL BY PCR (TYPE A & B)
INFLAPCR: NEGATIVE
Influenza B By PCR: NEGATIVE

## 2016-02-22 LAB — LACTIC ACID, PLASMA
LACTIC ACID, VENOUS: 4 mmol/L — AB (ref 0.5–1.9)
LACTIC ACID, VENOUS: 2.9 mmol/L — AB (ref 0.5–1.9)

## 2016-02-22 LAB — I-STAT CG4 LACTIC ACID, ED: Lactic Acid, Venous: 1.93 mmol/L (ref 0.5–1.9)

## 2016-02-22 LAB — CBG MONITORING, ED: GLUCOSE-CAPILLARY: 286 mg/dL — AB (ref 65–99)

## 2016-02-22 LAB — CLOSTRIDIUM DIFFICILE BY PCR: CDIFFPCR: POSITIVE — AB

## 2016-02-22 MED ORDER — VITAL HIGH PROTEIN PO LIQD: 1000.0000 mL | ORAL | Status: DC

## 2016-02-22 MED ORDER — INSULIN ASPART 100 UNIT/ML ~~LOC~~ SOLN
2.0000 [IU] | SUBCUTANEOUS | Status: DC
  Administered 2016-02-23 – 2016-02-25 (×4): 2 [IU] via SUBCUTANEOUS
  Administered 2016-02-25: 3 [IU] via SUBCUTANEOUS
  Administered 2016-02-26 – 2016-03-22 (×46): 2 [IU] via SUBCUTANEOUS

## 2016-02-22 MED ORDER — CHLORHEXIDINE GLUCONATE 0.12% ORAL RINSE (MEDLINE KIT)
15.0000 mL | Freq: Two times a day (BID) | OROMUCOSAL | Status: DC
  Administered 2016-02-22 – 2016-03-24 (×63): 15 mL via OROMUCOSAL

## 2016-02-22 MED ORDER — PIPERACILLIN-TAZOBACTAM 3.375 G IVPB
3.3750 g | Freq: Three times a day (TID) | INTRAVENOUS | Status: DC
  Administered 2016-02-22 – 2016-02-26 (×12): 3.375 g via INTRAVENOUS
  Filled 2016-02-22 (×15): qty 50

## 2016-02-22 MED ORDER — ACETAMINOPHEN 10 MG/ML IV SOLN
1000.0000 mg | Freq: Once | INTRAVENOUS | Status: AC
  Administered 2016-02-22: 1000 mg via INTRAVENOUS
  Filled 2016-02-22: qty 100

## 2016-02-22 MED ORDER — VITAL HIGH PROTEIN PO LIQD
1000.0000 mL | ORAL | Status: DC
  Administered 2016-02-22 – 2016-02-23 (×2): 1000 mL
  Administered 2016-02-23: 13:00:00

## 2016-02-22 MED ORDER — ORAL CARE MOUTH RINSE
15.0000 mL | Freq: Four times a day (QID) | OROMUCOSAL | Status: DC
  Administered 2016-02-22 – 2016-03-24 (×123): 15 mL via OROMUCOSAL

## 2016-02-22 MED ORDER — ASPIRIN 300 MG RE SUPP: 300.0000 mg | RECTAL | Status: AC

## 2016-02-22 MED ORDER — FENTANYL CITRATE (PF) 100 MCG/2ML IJ SOLN
INTRAMUSCULAR | Status: AC
  Filled 2016-02-22: qty 2

## 2016-02-22 MED ORDER — IOPAMIDOL (ISOVUE-300) INJECTION 61%
INTRAVENOUS | Status: AC
  Administered 2016-02-22: 75 mL via INTRAVENOUS
  Filled 2016-02-22: qty 75

## 2016-02-22 MED ORDER — SODIUM CHLORIDE 0.9 % IV BOLUS (SEPSIS)
500.0000 mL | Freq: Once | INTRAVENOUS | Status: AC
  Administered 2016-02-22: 500 mL via INTRAVENOUS

## 2016-02-22 MED ORDER — SODIUM CHLORIDE 0.9 % IV BOLUS (SEPSIS)
1000.0000 mL | Freq: Once | INTRAVENOUS | Status: AC
  Administered 2016-02-22: 1000 mL via INTRAVENOUS

## 2016-02-22 MED ORDER — ASPIRIN 81 MG PO CHEW
324.0000 mg | CHEWABLE_TABLET | ORAL | Status: AC
  Administered 2016-02-22: 324 mg via ORAL
  Filled 2016-02-22: qty 4

## 2016-02-22 MED ORDER — ALBUTEROL SULFATE (2.5 MG/3ML) 0.083% IN NEBU: 2.5000 mg | INHALATION_SOLUTION | RESPIRATORY_TRACT | Status: DC | PRN

## 2016-02-22 MED ORDER — ACETAMINOPHEN 325 MG PO TABS
650.0000 mg | ORAL_TABLET | ORAL | Status: DC | PRN
  Administered 2016-02-23 – 2016-03-06 (×8): 650 mg via ORAL
  Filled 2016-02-22 (×8): qty 2

## 2016-02-22 MED ORDER — ETOMIDATE 2 MG/ML IV SOLN
INTRAVENOUS | Status: DC | PRN
  Administered 2016-02-22: 20 mg via INTRAVENOUS

## 2016-02-22 MED ORDER — MIDAZOLAM HCL 2 MG/2ML IJ SOLN
INTRAMUSCULAR | Status: AC
  Administered 2016-02-22: 2 mg
  Filled 2016-02-22: qty 4

## 2016-02-22 MED ORDER — VANCOMYCIN HCL IN DEXTROSE 1-5 GM/200ML-% IV SOLN
1000.0000 mg | Freq: Once | INTRAVENOUS | Status: AC
  Administered 2016-02-22: 1000 mg via INTRAVENOUS
  Filled 2016-02-22: qty 200

## 2016-02-22 MED ORDER — PRO-STAT SUGAR FREE PO LIQD
30.0000 mL | Freq: Two times a day (BID) | ORAL | Status: DC
  Administered 2016-02-22: 30 mL
  Filled 2016-02-22: qty 30

## 2016-02-22 MED ORDER — FENTANYL CITRATE (PF) 100 MCG/2ML IJ SOLN
100.0000 ug | Freq: Once | INTRAMUSCULAR | Status: AC
  Administered 2016-02-22: 100 ug via INTRAVENOUS

## 2016-02-22 MED ORDER — VANCOMYCIN HCL IN DEXTROSE 750-5 MG/150ML-% IV SOLN
750.0000 mg | Freq: Three times a day (TID) | INTRAVENOUS | Status: DC
  Administered 2016-02-22 – 2016-02-24 (×5): 750 mg via INTRAVENOUS
  Filled 2016-02-22 (×7): qty 150

## 2016-02-22 MED ORDER — PROPOFOL 1000 MG/100ML IV EMUL
5.0000 ug/kg/min | INTRAVENOUS | Status: DC
  Administered 2016-02-22: 40 ug/kg/min via INTRAVENOUS
  Administered 2016-02-22: 20 ug/kg/min via INTRAVENOUS
  Administered 2016-02-22: 40 ug/kg/min via INTRAVENOUS
  Administered 2016-02-22: 60 ug/kg/min via INTRAVENOUS
  Administered 2016-02-23 (×2): 45 ug/kg/min via INTRAVENOUS
  Filled 2016-02-22 (×4): qty 100

## 2016-02-22 MED ORDER — FAMOTIDINE 40 MG/5ML PO SUSR
20.0000 mg | Freq: Two times a day (BID) | ORAL | Status: DC
  Administered 2016-02-22 – 2016-03-24 (×63): 20 mg
  Filled 2016-02-22 (×66): qty 2.5

## 2016-02-22 MED ORDER — PROPOFOL 1000 MG/100ML IV EMUL
INTRAVENOUS | Status: AC
  Administered 2016-02-22: 20 ug/kg/min via INTRAVENOUS
  Filled 2016-02-22: qty 100

## 2016-02-22 MED ORDER — PIPERACILLIN-TAZOBACTAM 3.375 G IVPB 30 MIN
3.3750 g | Freq: Once | INTRAVENOUS | Status: AC
  Administered 2016-02-22: 3.375 g via INTRAVENOUS
  Filled 2016-02-22: qty 50

## 2016-02-22 MED ORDER — DEXTROSE 5 % IV SOLN
500.0000 mg | INTRAVENOUS | Status: DC
  Administered 2016-02-22 – 2016-02-23 (×2): 500 mg via INTRAVENOUS
  Filled 2016-02-22 (×3): qty 500

## 2016-02-22 MED ORDER — ONDANSETRON HCL 4 MG/2ML IJ SOLN: 4.0000 mg | Freq: Four times a day (QID) | INTRAMUSCULAR | Status: DC | PRN

## 2016-02-22 MED ORDER — SODIUM CHLORIDE 0.9 % IV SOLN
INTRAVENOUS | Status: DC
  Administered 2016-02-23: 17:00:00 via INTRAVENOUS

## 2016-02-22 MED ORDER — SODIUM CHLORIDE 0.9 % IV SOLN
INTRAVENOUS | Status: DC
  Administered 2016-02-22 – 2016-02-23 (×3): via INTRAVENOUS

## 2016-02-22 MED ORDER — ENOXAPARIN SODIUM 40 MG/0.4ML ~~LOC~~ SOLN
40.0000 mg | SUBCUTANEOUS | Status: DC
  Administered 2016-02-22 – 2016-03-24 (×31): 40 mg via SUBCUTANEOUS
  Filled 2016-02-22 (×32): qty 0.4

## 2016-02-22 MED ORDER — VANCOMYCIN HCL 500 MG IV SOLR
500.0000 mg | Freq: Once | INTRAVENOUS | Status: AC
  Administered 2016-02-22: 500 mg via INTRAVENOUS
  Filled 2016-02-22 (×2): qty 500

## 2016-02-22 MED ORDER — FENTANYL CITRATE (PF) 100 MCG/2ML IJ SOLN
INTRAMUSCULAR | Status: AC
  Administered 2016-02-22: 100 ug
  Filled 2016-02-22: qty 2

## 2016-02-22 MED ORDER — VANCOMYCIN 50 MG/ML ORAL SOLUTION
125.0000 mg | Freq: Four times a day (QID) | ORAL | Status: AC
  Administered 2016-02-22 – 2016-03-07 (×55): 125 mg via ORAL
  Filled 2016-02-22 (×60): qty 2.5

## 2016-02-22 MED ORDER — SUCCINYLCHOLINE CHLORIDE 20 MG/ML IJ SOLN
INTRAMUSCULAR | Status: DC | PRN
  Administered 2016-02-22: 100 mg via INTRAVENOUS

## 2016-02-22 MED ORDER — SODIUM CHLORIDE 0.9 % IV SOLN
250.0000 mL | INTRAVENOUS | Status: DC | PRN
  Administered 2016-03-04: 10 mL/h via INTRAVENOUS
  Administered 2016-03-06 – 2016-03-23 (×5): 250 mL via INTRAVENOUS

## 2016-02-22 NOTE — Procedures (Addendum)
PCCM Bronchoscopy Procedure Note  The patient was informed of the risks (including but not limited to bleeding, infection, respiratory failure, lung injury, tooth/oral injury) and benefits of the procedure and gave consent, see chart.  Indication: Multilobar PNA, right upper lobe collapse  Post Procedure Diagnosis: Same  Condition pre procedure: Stable  Medications for procedure: Propofo gttl, 1 mg versed, 50 mcg fentanyl  Procedure description: The bronchoscope was introduced through the  endotracheal tube and passed to the bilateral lungs to the level of the subsegmental bronchi throughout the tracheobronchial tree.   Airway exam revealed the right upper lobe was completely occluded with mucus plugs. Right middle and right lower lobe was partially occluded. The left lung was clear. Lavage performed with clearance of secretions.   Procedures performed: Lavage  Specimens sent: Cultures  Condition post procedure: Stable  EBL: 0 cc  Complications: None  Chilton GreathousePraveen Briteny Fulghum MD [facility] Pager [phone] If no answer or after 3pm call: [phone] 02/22/2016, 2:47 PM

## 2016-02-22 NOTE — Progress Notes (Signed)
Initial Nutrition Assessment  DOCUMENTATION CODES:   Not applicable  INTERVENTION:    Initiate TF via PEG with Vital High Protein at goal rate of 50 ml/h (1200 ml per day) to provide 1200 kcals, 105 gm protein, 1003 ml free water daily.  Total intake with Propofol and TF will be 1614 kcal per day.  NUTRITION DIAGNOSIS:   Inadequate oral intake related to inability to eat as evidenced by NPO status.  GOAL:   Patient will meet greater than or equal to 90% of their needs  MONITOR:   Vent status, Labs, TF tolerance, I & O's  REASON FOR ASSESSMENT:   Consult Enteral/tube feeding initiation and management  ASSESSMENT:   45 year old with past medical history of frontotemporal dementia, restless leg syndrome. She is a nursing home resident. She was brought to the ED with altered mental status and respiratory distress. Required intubation in the ED.  Discussed patient in ICU rounds and with RN today. Received MD Consult for TF initiation and management. PEG in place. Unsure of usual TF regimen. Patient is currently intubated on ventilator support MV: 9.4 L/min Temp (24hrs), Avg:98.8 F (37.1 C), Min:97.7 F (36.5 C), Max:101.7 F (38.7 C)  Propofol: 15.7 ml/hr providing 414 kcal per day Patient is contracted, unable to accurately assess muscle and fat mass; nutrition focused physical exam not completed. Labs reviewed. CBG: 286 this AM Medications reviewed and include propofol.  Diet Order:  Diet NPO time specified  Skin:  Reviewed, no issues  Last BM:  unknown  Height:   Ht Readings from Last 1 Encounters:  02/22/16 5\' 5"  (1.651 m)    Weight:   Wt Readings from Last 1 Encounters:  02/22/16 143 lb 15.4 oz (65.3 kg)    Ideal Body Weight:  56.8 kg  BMI:  Body mass index is 23.96 kg/m.  Estimated Nutritional Needs:   Kcal:  1700  Protein:  90-105 gm  Fluid:  1.8 L  EDUCATION NEEDS:   No education needs identified at this time  Joaquin CourtsKimberly Harris,  RD, LDN, CNSC Pager [phone] After Hours Pager [phone]

## 2016-02-22 NOTE — ED Notes (Signed)
Pulse ox changed to L ear, sats now 92%

## 2016-02-22 NOTE — ED Notes (Signed)
EDP & radiologist concerned about pneumo vs. consolidation CXR. Suggested that CT Chest WO contrast be done

## 2016-02-22 NOTE — ED Triage Notes (Signed)
[facility], called out for resp distress. Rhonci all lobes. Pt was 40% RA, pt was clenched. ETCO2 en route 37, assisted ventilation O2 up to 97, NPA R nare. Hx of Creutzfelt Jakob Dz. HR 130's, systolic BP 170. EDP and resp at bedside preparing to intubate

## 2016-02-22 NOTE — Progress Notes (Signed)
Bronch BAL specimens labeled and walked down to lab w/ requisitions.

## 2016-02-22 NOTE — H&P (Addendum)
PULMONARY / CRITICAL CARE MEDICINE   Name: [name] [name] MRN: [id number] DOB: April 07, 1970    ADMISSION DATE:  02/22/2016 CONSULTATION DATE:  02/22/16  REFERRING MD:  Baxter Hire Ward MD  CHIEF COMPLAINT:  Acute respiratory failure, Multilobar PNA  HISTORY OF PRESENT ILLNESS:   [name] [name] is a 45 year old with past medical history of frontotemporal dementia, restless leg syndrome. She is a nursing home resident. She was brought to the ED with altered mental status and respiratory distress. She had sats in the 40s and was intubated and PCCM called for admission. Chest x-ray shows right upper lobe and right middle lobe consolidation/collapse. In the ED she has a temp of 101.2, she has been given 3 L NS and started on normal saline at 125/hour. She has received a dose of Vanco, Zosyn. She had a large bowel movement and has been placed on enteric precautions as well. ABG showed respiratory acidosis and vent rate was increased to 20. She also has leukocytosis with elevated lactic acid.  Regarding her neurologic issues. 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 and has been evaluated by acute neurology in 2015. This was felt to be more consistent with frontotemporal dementia. Given the progressive nature of this disease palliation was advised. 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 The case was discussed with palliative care team at [facility] during her last admission in May 2017. Outpatient palliative care follow-up was recommended. However she remains full code.  PAST MEDICAL HISTORY :  She  has a past medical history of Aphagia; Bacteremia; Constipation; [name] [name] disease; Dementia; Depression; Esophageal reflux; Pneumonia; Restless leg syndrome; Sepsis (HCC); Staphylococcus hominis sepsis (HCC) (01/24/2015); UTI (urinary tract infection); UTI (urinary tract infection) due to Enterococcus (01/24/2015); and Vitamin B12 deficiency.  PAST SURGICAL HISTORY: She  has a past  surgical history that includes PEG tube placement and Cholecystectomy.  Allergies  Allergen Reactions  . Sulfa Antibiotics Other (See Comments)    Per MAR    No current facility-administered medications on file prior to encounter.    Current Outpatient Prescriptions on File Prior to Encounter  Medication Sig  . baclofen (LIORESAL) 10 MG tablet 10 mg by PEG Tube route 3 (three) times daily. 9am, 2pm, 9pm  . cetirizine (ZYRTEC) 10 MG tablet 10 mg by PEG Tube route daily.   . Cholecalciferol (VITAMIN D-3) 5000 UNITS TABS 5,000 Units by PEG Tube route See admin instructions. On Monday and Saturday morning and evening  . esomeprazole (NEXIUM) 40 MG packet 40 mg by PEG Tube route daily.  Marland Kitchen guaiFENesin (ROBITUSSIN) 100 MG/5ML SOLN 400 mg by PEG Tube route every 6 (six) hours as needed (congestion).   . meloxicam (MOBIC) 7.5 MG tablet 7.5 mg by PEG Tube route daily.   Marland Kitchen morphine (ROXANOL) 20 MG/ML concentrated solution Place 0.25 mLs (5 mg total) into feeding tube every 2 (two) hours as needed (pain/ shortness of breath/ air hunger).  . Multiple Vitamin (MULTIVITAMIN) LIQD 5 mLs by PEG Tube route daily.  . polyethylene glycol (MIRALAX / GLYCOLAX) packet 17 g by PEG Tube route daily.   Marland Kitchen PRESCRIPTION MEDICATION by PEG Tube route continuous. Enteral Feed order Two Cal HN 30 ml/hr  . rOPINIRole (REQUIP) 0.25 MG tablet 0.25 mg by PEG Tube route daily at 6 PM.   . Saccharomyces boulardii (FLORASTOR PO) 1 capsule by PEG Tube route daily.  . vitamin B-12 (CYANOCOBALAMIN) 1000 MCG tablet 1,000 mcg by PEG Tube route daily.  . Water For Irrigation,  Sterile (FREE WATER) SOLN 175 mLs by PEG Tube route every 4 (four) hours.   Marland Kitchen. albuterol (PROVENTIL) (2.5 MG/3ML) 0.083% nebulizer solution Take 3 mLs (2.5 mg total) by nebulization 4 (four) times daily. (Patient not taking: Reported on 02/22/2016)  . bisacodyl (DULCOLAX) 10 MG suppository Place 10 mg rectally every other day as needed for moderate constipation.   Marland Kitchen. ipratropium-albuterol (DUONEB) 0.5-2.5 (3) MG/3ML SOLN Take 3 mLs by nebulization 4 (four) times daily - after meals and at bedtime. Last dose will be a.m. Of 07/29/15  . sucralfate (CARAFATE) 1 GM/10ML suspension 1 g by PEG Tube route 4 (four) times daily. 9am, 2pm, 4pm, 8pm    FAMILY HISTORY:  Her has no family status information on file.    SOCIAL HISTORY: She  reports that she has never smoked. She does not have any smokeless tobacco history on file. She reports that she does not drink alcohol or use drugs.  REVIEW OF SYSTEMS:   Unable to obtain as patient is intubated  SUBJECTIVE:   VITAL SIGNS: BP 103/81   Pulse 114   Temp 98.2 F (36.8 C)   Resp 20   Ht 5\' 5"  (1.651 m)   Wt 145 lb (65.8 kg)   SpO2 100%   BMI 24.13 kg/m   HEMODYNAMICS:    VENTILATOR SETTINGS: Vent Mode: PRVC FiO2 (%):  [100 %] 100 % Set Rate:  [14 bmp-20 bmp] 20 bmp Vt Set:  [460 mL] 460 mL PEEP:  [5 cmH20-10 cmH20] 5 cmH20 Plateau Pressure:  [30 cmH20] 30 cmH20  INTAKE / OUTPUT: No intake/output data recorded.  PHYSICAL EXAMINATION: General:  Sedated, intubated Neuro:  Unresponsive HEENT:  No thyromegaly, JVD Cardiovascular:  RRR, no MRG Lungs:  Diffuse rhonchi on right. No wheeze Abdomen:  Distended, + BS Musculoskeletal:  Contracted arms Skin:  Intact  LABS:  BMET  Recent Labs Lab 02/22/16 0601 02/22/16 0622  NA 139 139  K 4.7 4.8  CL 99* 102  CO2  --  32  BUN 23* 18  CREATININE 0.70 0.71  GLUCOSE 289* 273*    Electrolytes  Recent Labs Lab 02/22/16 0622  CALCIUM 8.6*    CBC  Recent Labs Lab 02/22/16 0601 02/22/16 0622  WBC  --  15.6*  HGB 15.6* 13.4  HCT 46.0 43.6  PLT  --  339    Coag's No results for input(s): APTT, INR in the last 168 hours.  Sepsis Markers  Recent Labs Lab 02/22/16 0602  LATICACIDVEN 1.93*    ABG  Recent Labs Lab 02/22/16 0639  PHART 7.107*  PCO2ART 88.8*  PO2ART 103.0    Liver Enzymes  Recent Labs Lab  02/22/16 0622  AST 36  ALT 40  ALKPHOS 120  BILITOT 0.5  ALBUMIN 3.5    Cardiac Enzymes  Recent Labs Lab 02/22/16 0622  TROPONINI <0.03    Glucose  Recent Labs Lab 02/22/16 0547  GLUCAP 286*    Imaging Dg Chest Portable 1 View  Result Date: 02/22/2016 CLINICAL DATA:  45 year old female status post ET tube placement. Patient's septic. EXAM: PORTABLE CHEST 1 VIEW COMPARISON:  Chest radiograph dated 07/28/2015 FINDINGS: An endotracheal tube is seen with tip approximately 3 cm above the carina. An enteric tube extends into the left upper abdomen with tip and side-port likely in the proximal stomach. There is near complete consolidative changes of the right upper lobe with collapse. There is near complete consolidative changes of the right middle lobe. The lucent area within the right  hemithorax likely represents and aerated portion of the lung. A pneumothorax is less likely as there appears to be lung markings, but not entirely excluded. Further evaluation with CT is recommended. The left lung is clear.  The osseous structures are intact. IMPRESSION: Endotracheal tube above the carina. Large consolidative areas primarily in the right upper lobe and right middle lobe. A centrally obstructing mass or endobronchial lesion is not excluded. CT is recommended for further evaluation. Probable aerated portion of the right lower lobe versus less likely a pneumothorax. These results were called by telephone at the time of interpretation on 02/22/2016 at 6:45 am to Dr. Rochele RaringKRISTEN WARD , who verbally acknowledged these results. Electronically Signed   By: Elgie CollardArash  Radparvar M.D.   On: 02/22/2016 06:47   STUDIES:  CT chest 12/21 > Pending  CULTURES: Bcx 12/21 x2 > Sputum Cx 12/21 >  ANTIBIOTICS: Vanco 12/21 > Zosyn 12/21 > Azithro 12/21 >  SIGNIFICANT EVENTS: 12/21 > Admit with resp failure.   LINES/TUBES:  DISCUSSION: 45 year old with progressive frontotemporal dementia. Admitted with  acute respiratory failure, multilobar pneumonia.  ASSESSMENT / PLAN:  PULMONARY A: Acute hypercapnic, hypoxic respiratory failure Multilobar pneumonia P:   Continue vent support Recheck ABG CT of chest Follow cultures Abx as above Check RVP  CARDIOVASCULAR A:  Sinus Tachycardia. Likely from sepsis P:  Tele Check troponins. Follow EKG, Lactic A  RENAL A:   Stable P:   Monitor urine output and Cr  GASTROINTESTINAL A:   Diarrhea P:   Enteric precautions Check C diff Start TFs later today via PEG.  HEMATOLOGIC A:   Leukocytosis from sepsis P:  Follow CBC  INFECTIOUS A:   Multilobar PNA R/O C diff P:   Continue vanco, zosyn. Add azithro for atypical coverage Follow Pct, cultures Check Urine legionella, penumococcus, RVP  ENDOCRINE A:   Elevated BS P:   SSI coverage  NEUROLOGIC A:   Frontotemporal dementia P:   Palliation recommended by [facility] given the progressive nature of the disease. Will get palliative care involved again.  FAMILY  - Updates: No family at bedside.  - Inter-disciplinary family meet or Palliative Care meeting due by:  12/28  Critical care time- 45 mins.  Chilton GreathousePraveen Blakely Gluth MD [facility] Pager [phone] If no answer or after 3pm call: [phone] 02/22/2016, 8:49 AM

## 2016-02-22 NOTE — ED Notes (Signed)
EDP at bedside aware O2 72%. Pt biting on ETT. Given total of 60mg  bolus Propofol. O2 sats inc to 85%.

## 2016-02-22 NOTE — Code Documentation (Signed)
X-ray at bedside

## 2016-02-22 NOTE — ED Notes (Signed)
O2 sats dropping again, at 71%, EDP preparing to get Glidescope ready to assess ETT

## 2016-02-22 NOTE — ED Notes (Signed)
OPA placed R side of mouth to act as "bite guard"

## 2016-02-22 NOTE — Progress Notes (Signed)
Patient transported to CT and to room 2M07 without any apparent complications. RT will continue to monitor.

## 2016-02-22 NOTE — Progress Notes (Signed)
Panic Venous Gas results called to Dr Isaiah SergeMannam.  Per MD, no new vent orders now.  O2 sat on monitor 100% currently.  Per MD, sat goal > 90%.

## 2016-02-22 NOTE — Progress Notes (Signed)
[facility] from [facility]. Patient presently intubated. CSW following for disposition and return to SNF when medically stable and appropriate for discharge.    Enos FlingAshley Charlita Brian, MSW, LCSW [facility] ED/87[facility] Clinical Social Worker [phone]

## 2016-02-22 NOTE — Progress Notes (Signed)
Pharmacy Antibiotic Note  [name] SchmidtChevella Bordelon is a 45 y.o. female admitted on 02/22/2016 with acute respiratory failure concerning for pneumonia. In the ED she was febrile to 101.2 with near complete consolidative changes in the right middle lobe. She is now afebrile with wbc of 15.6 with normal renal function (CrCl 80-90 mL/min). Of note, she has a complicated ID history, including staph hominis sepsis in November 2016. First doses of vancomycin and zosyn given in ED (0708 and 0748, respectively). Will complete load of vancomycin with additional 500mg . Azithromycin also ordered. Pharmacy has been consulted for vancomycin/zosyn dosing.   Plan: Give vancomycin 500 mg x1 to complete 1500mg  load Start vancomycin 750 mg IV q8h Start zosyn 3.375 gm IV q8h Monitor c/s, vanc trough prn, clinical status, ability to de-escalate, and renal function   Height: 5\' 5"  (165.1 cm) Weight: 145 lb (65.8 kg) IBW/kg (Calculated) : 57  Temp (24hrs), Avg:98.9 F (37.2 C), Min:98.2 F (36.8 C), Max:101.7 F (38.7 C)   Recent Labs Lab 02/22/16 0601 02/22/16 0602 02/22/16 0622  WBC  --   --  15.6*  CREATININE 0.70  --  0.71  LATICACIDVEN  --  1.93*  --     Estimated Creatinine Clearance: 79.1 mL/min (by C-G formula based on SCr of 0.71 mg/dL).    Allergies  Allergen Reactions  . Sulfa Antibiotics Other (See Comments)    Per MAR    Antimicrobials this admission: Vancomycin 12/21 >>  Zosyn 12/21 >>   Dose adjustments this admission:  Microbiology results: 12/21 BCx: sent 12/21 UCx: sent  12/21 C. Diff Ag positive, toxin negative, PCR indeterminate.  Thank you for allowing pharmacy to be a part of this patient's care.  Alfredo BachJoseph Kinsler Soeder, Cleotis NipperBS, PharmD Clinical Pharmacy Resident [phone] (Pager) 02/22/2016 9:34 AM

## 2016-02-22 NOTE — ED Provider Notes (Signed)
TIME SEEN: 5:45 AM  CHIEF COMPLAINT: Altered mental status, respiratory distress  HPI: Pt is a 45 y.o. female with history of [place] Harlene SaltsJakob disease [facility] who presents to the emergency department with altered mental status, respiratory distress. Patient found by staff today altered with increased work of breathing. Sats in the 40s per EMS. Here patient is completely unresponsive. EMS reports nonverbal at baseline. Patient receiving assisted ventilation with BVM.  ROS: Level V caveat for AMS  PAST MEDICAL HISTORY/PAST SURGICAL HISTORY:  Past Medical History:  Diagnosis Date  . Aphagia   . Bacteremia   . Constipation   . Lenise ArenaCreutzfeldt Jakob disease   . Dementia   . Depression   . Esophageal reflux   . Pneumonia   . Restless leg syndrome   . Sepsis (HCC)   . Staphylococcus hominis sepsis (HCC) 01/24/2015  . UTI (urinary tract infection)   . UTI (urinary tract infection) due to Enterococcus 01/24/2015  . Vitamin B12 deficiency     MEDICATIONS:  Prior to Admission medications   Medication Sig Start Date End Date Taking? Authorizing Provider  albuterol (PROVENTIL) (2.5 MG/3ML) 0.083% nebulizer solution Take 3 mLs (2.5 mg total) by nebulization 4 (four) times daily. Patient taking differently: Take 2.5 mg by nebulization every 6 (six) hours as needed for wheezing.  10/20/13   Christiane Haorinna L Sullivan, MD  baclofen (LIORESAL) 10 MG tablet 10 mg by PEG Tube route 3 (three) times daily. 9am, 2pm, 9pm    Historical Provider, MD  bisacodyl (DULCOLAX) 10 MG suppository Place 10 mg rectally every other day as needed for moderate constipation.    Historical Provider, MD  cetirizine (ZYRTEC) 10 MG tablet 10 mg by PEG Tube route daily.     Historical Provider, MD  Cholecalciferol (VITAMIN D-3) 5000 UNITS TABS 5,000 Units by PEG Tube route 2 (two) times a week. On Monday and Saturday    Historical Provider, MD  esomeprazole (NEXIUM) 40 MG packet 40 mg by PEG Tube route daily.     Historical Provider, MD  guaiFENesin (ROBITUSSIN) 100 MG/5ML SOLN 400 mg by PEG Tube route every 6 (six) hours as needed (congestion).     Historical Provider, MD  ipratropium-albuterol (DUONEB) 0.5-2.5 (3) MG/3ML SOLN Take 3 mLs by nebulization 4 (four) times daily - after meals and at bedtime. Last dose will be a.m. Of 07/29/15    Historical Provider, MD  meloxicam (MOBIC) 7.5 MG tablet 7.5 mg by PEG Tube route daily.     Historical Provider, MD  morphine (ROXANOL) 20 MG/ML concentrated solution Place 0.25 mLs (5 mg total) into feeding tube every 2 (two) hours as needed (pain/ shortness of breath/ air hunger). 07/30/15   Lora PaulaJennifer T Krall, MD  Multiple Vitamin (MULTIVITAMIN) LIQD 5 mLs by PEG Tube route daily.    Historical Provider, MD  polyethylene glycol (MIRALAX / GLYCOLAX) packet 17 g by PEG Tube route daily.     Historical Provider, MD  PRESCRIPTION MEDICATION by PEG Tube route continuous. Enteral Feed order Two Cal HN 30 ml/hr    Historical Provider, MD  rOPINIRole (REQUIP) 0.25 MG tablet 0.25 mg by PEG Tube route daily at 6 PM.     Historical Provider, MD  Saccharomyces boulardii (FLORASTOR PO) 1 capsule by PEG Tube route daily.    Historical Provider, MD  sucralfate (CARAFATE) 1 GM/10ML suspension 1 g by PEG Tube route 4 (four) times daily. 9am, 2pm, 4pm, 8pm    Historical Provider, MD  vitamin B-12 (  CYANOCOBALAMIN) 1000 MCG tablet 1,000 mcg by PEG Tube route daily.    Historical Provider, MD  Water For Irrigation, Sterile (FREE WATER) SOLN 175 mLs by PEG Tube route every 4 (four) hours.     Historical Provider, MD    ALLERGIES:  Allergies  Allergen Reactions  . Sulfa Antibiotics Other (See Comments)    Per MAR    SOCIAL HISTORY:  Social History  Substance Use Topics  . Smoking status: Never Smoker  . Smokeless tobacco: Not on file  . Alcohol use No    FAMILY HISTORY: Family History  Problem Relation Age of Onset  . Family history unknown: Yes    EXAM: BP (!) 84/56    Pulse (!) 122   Temp 101.7 F (38.7 C) (Axillary)   Resp 15   Ht 5\' 5"  (1.651 m)   Wt 145 lb (65.8 kg)   SpO2 94%   BMI 24.13 kg/m  CONSTITUTIONAL: Febrile, chronically ill-appearing, GCS 3 HEAD: Normocephalic EYES: Conjunctivae clear, PERRL but sluggishly reactive bilaterally ENT: normal nose; no rhinorrhea; moist mucous membranes NECK: Supple, no lymphedema, trachea midline CARD: Regular and tachycardic; S1 and S2 appreciated; no murmurs, no clicks, no rubs, no gallops RESP: Tachypneic, diffuse rhonchorous breath sounds, hypoxic on room air, receiving assisted ventilation with BVM ABD/GI: Normal bowel sounds; non-distended; soft, PEG tube in place RECTAL:  Large amount of foul-smelling brown stool without blood or melena noted in her diaper BACK:  The back appears normal EXT: All extremities are contracted, no edema; normal capillary refill; no cyanosis, no calf tenderness or swelling    SKIN: Normal color for age and race; warm; no rash, warm to touch NEURO: GCS 3  MEDICAL DECISION MAKING: Patient here with altered mental status, respiratory distress, hypoxia. She is a kid sepsis. Rectal temperature here of 101.7. Patient has a GCS of 3 and was hypoxic. Intubated for respiratory failure and airway protection. Per chart and EMS patient is a full code. No family here at bedside. We'll obtain labs, cultures, chest x-ray, urine, stool cultures, flu swab. We'll give broad-spectrum antibiotics and IV fluids. She will be sedated using propofol.  ED PROGRESS: Patient had an episode where her oxygen saturation dropped into the 70s after intubation. Still has equal bilateral rhonchorous breath sounds and endotracheal tube does not appear to have moved. She is biting on the tube. We have increased her sedation which has dropped her blood pressure but has increased her pulse ox reading to the 90s. Initially increased her PEEP to 10 but now have brought it back down as her sats are 100%.. Also  switched the pulse ox site.  Will obtain ABG. Chest x-ray shows what appears to be a large right-sided consolidations. Endotracheal tube below the level of the clavicles but above the carina. Discussed with radiologist and we do not think there is a pneumothorax. Will obtain a CT scan of her chest with contrast for further evaluation.  He would like to rule out mass.  6:45 AM ABG shows rest or acidosis. We will increase her respiratory rate to 20. She does have a leukocytosis with left shift and elevated lactate. Glucose normal.  Critical care has been paged for admission.   7:10 AM  Pt's blood pressure has improved. Oxygen still 100% now back on a PEEP of 5. Still no family at bedside.  CT chest pending. Discussed with Dr. Isaiah SergeMannam with critical care. They will see the patient for admission.   EKG Interpretation  Date/Time:  Thursday  February 22 2016 05:51:08 EST Ventricular Rate:  131 PR Interval:    QRS Duration: 81 QT Interval:  264 QTC Calculation: 390 R Axis:   41 Text Interpretation:  Sinus tachycardia Probable left atrial enlargement Minimal ST depression, inferior leads No significant change since last tracing Confirmed by Elysse Polidore,  DO, Haskel Dewalt ([id number]) on 02/22/2016 6:27:24 AM         INTUBATION Performed by: Raelyn Number  Required items: required blood products, implants, devices, and special equipment available Patient identity confirmed: provided demographic data and hospital-assigned identification number Time out: Immediately prior to procedure a "time out" was called to verify the correct patient, procedure, equipment, support staff and site/side marked as required.  Indications: Airway protection, respiratory failure   Intubation method: Glidescope Laryngoscopy   Preoxygenation: BVM  Sedatives: 20 mg IV Etomidate Paralytic: 100 mg IV Succinylcholine  Tube Size: 7.5 cuffed  Post-procedure assessment: chest rise and ETCO2 monitor Breath sounds: equal and absent  over the epigastrium Tube secured with: ETT holder Chest x-ray interpreted by radiologist and me.  Chest x-ray findings: endotracheal tube in appropriate position  Patient tolerated the procedure well with no immediate complications.     CRITICAL CARE Performed by: Raelyn Number   Total critical care time: 60 minutes  Critical care time was exclusive of separately billable procedures and treating other patients.  Critical care was necessary to treat or prevent imminent or life-threatening deterioration.  Critical care was time spent personally by me on the following activities: development of treatment plan with patient and/or surrogate as well as nursing, discussions with consultants, evaluation of patient's response to treatment, examination of patient, obtaining history from patient or surrogate, ordering and performing treatments and interventions, ordering and review of laboratory studies, ordering and review of radiographic studies, pulse oximetry and re-evaluation of patient's condition.    Layla Maw Ivis Nicolson, DO 02/22/16 [phone]

## 2016-02-22 NOTE — Code Documentation (Signed)
Ward DO successfully intubated on first attempt, 7.5, 23 @ teeth

## 2016-02-23 ENCOUNTER — Encounter (HOSPITAL_COMMUNITY): Payer: Self-pay | Admitting: Interventional Radiology

## 2016-02-23 ENCOUNTER — Inpatient Hospital Stay (HOSPITAL_COMMUNITY): Payer: Medicare Other

## 2016-02-23 DIAGNOSIS — A0472 Enterocolitis due to Clostridium difficile, not specified as recurrent: Secondary | ICD-10-CM

## 2016-02-23 DIAGNOSIS — L8990 Pressure ulcer of unspecified site, unspecified stage: Secondary | ICD-10-CM | POA: Insufficient documentation

## 2016-02-23 DIAGNOSIS — J9811 Atelectasis: Secondary | ICD-10-CM

## 2016-02-23 HISTORY — PX: IR GENERIC HISTORICAL: IMG1180011

## 2016-02-23 LAB — BASIC METABOLIC PANEL
ANION GAP: 7 (ref 5–15)
ANION GAP: 9 (ref 5–15)
BUN: 9 mg/dL (ref 6–20)
BUN: 5 mg/dL — AB (ref 6–20)
CALCIUM: 8 mg/dL — AB (ref 8.9–10.3)
CHLORIDE: 108 mmol/L (ref 101–111)
CO2: 22 mmol/L (ref 22–32)
CO2: 26 mmol/L (ref 22–32)
CREATININE: 0.52 mg/dL (ref 0.44–1.00)
Calcium: 8.3 mg/dL — ABNORMAL LOW (ref 8.9–10.3)
Chloride: 112 mmol/L — ABNORMAL HIGH (ref 101–111)
Creatinine, Ser: 0.54 mg/dL (ref 0.44–1.00)
GFR calc Af Amer: >60 mL/min (ref >60)
Glucose, Bld: 113 mg/dL — ABNORMAL HIGH (ref 65–99)
Glucose, Bld: 114 mg/dL — ABNORMAL HIGH (ref 65–99)
POTASSIUM: 3.2 mmol/L — AB (ref 3.5–5.1)
Potassium: 2.3 mmol/L — CL (ref 3.5–5.1)
SODIUM: 141 mmol/L (ref 135–145)
Sodium: 143 mmol/L (ref 135–145)

## 2016-02-23 LAB — BLOOD CULTURE ID PANEL (REFLEXED)
ACINETOBACTER BAUMANNII: NOT DETECTED
CANDIDA GLABRATA: NOT DETECTED
CANDIDA KRUSEI: NOT DETECTED
CANDIDA TROPICALIS: NOT DETECTED
Candida albicans: NOT DETECTED
Candida parapsilosis: NOT DETECTED
ENTEROBACTER CLOACAE COMPLEX: NOT DETECTED
ENTEROBACTERIACEAE SPECIES: NOT DETECTED
ESCHERICHIA COLI: NOT DETECTED
Enterococcus species: NOT DETECTED
HAEMOPHILUS INFLUENZAE: NOT DETECTED
KLEBSIELLA OXYTOCA: NOT DETECTED
KLEBSIELLA PNEUMONIAE: NOT DETECTED
Listeria monocytogenes: NOT DETECTED
Methicillin resistance: DETECTED — AB
Neisseria meningitidis: NOT DETECTED
PSEUDOMONAS AERUGINOSA: NOT DETECTED
Proteus species: NOT DETECTED
STREPTOCOCCUS PNEUMONIAE: NOT DETECTED
STREPTOCOCCUS PYOGENES: NOT DETECTED
Serratia marcescens: NOT DETECTED
Staphylococcus aureus (BCID): NOT DETECTED
Staphylococcus species: DETECTED — AB
Streptococcus agalactiae: NOT DETECTED
Streptococcus species: NOT DETECTED

## 2016-02-23 LAB — BLOOD GAS, ARTERIAL
ACID-BASE DEFICIT: 1.3 mmol/L (ref 0.0–2.0)
BICARBONATE: 21.3 mmol/L (ref 20.0–28.0)
Drawn by: 449561
FIO2: 40
LHR: 20 {breaths}/min
MECHVT: 460 mL
O2 Saturation: 99.2 %
PEEP/CPAP: 5 cmH2O
Patient temperature: 95
pCO2 arterial: 24.2 mmHg — ABNORMAL LOW (ref 32.0–48.0)
pH, Arterial: 7.545 — ABNORMAL HIGH (ref 7.350–7.450)
pO2, Arterial: 134 mmHg — ABNORMAL HIGH (ref 83.0–108.0)

## 2016-02-23 LAB — GLUCOSE, CAPILLARY
GLUCOSE-CAPILLARY: 100 mg/dL — AB (ref 65–99)
GLUCOSE-CAPILLARY: 107 mg/dL — AB (ref 65–99)
GLUCOSE-CAPILLARY: 108 mg/dL — AB (ref 65–99)
Glucose-Capillary: 120 mg/dL — ABNORMAL HIGH (ref 65–99)
Glucose-Capillary: 106 mg/dL — ABNORMAL HIGH (ref 65–99)
Glucose-Capillary: 140 mg/dL — ABNORMAL HIGH (ref 65–99)

## 2016-02-23 LAB — CBC
HCT: 33.7 % — ABNORMAL LOW (ref 36.0–46.0)
Hemoglobin: 10.8 g/dL — ABNORMAL LOW (ref 12.0–15.0)
MCH: 26.2 pg (ref 26.0–34.0)
MCHC: 32 g/dL (ref 30.0–36.0)
MCV: 81.6 fL (ref 78.0–100.0)
PLATELETS: 217 10*3/uL (ref 150–400)
RBC: 4.13 MIL/uL (ref 3.87–5.11)
RDW: 16.4 % — AB (ref 11.5–15.5)
WBC: 15.6 10*3/uL — AB (ref 4.0–10.5)

## 2016-02-23 LAB — URINE CULTURE

## 2016-02-23 LAB — HIV ANTIBODY (ROUTINE TESTING W REFLEX): HIV SCREEN 4TH GENERATION: NONREACTIVE

## 2016-02-23 LAB — LEGIONELLA PNEUMOPHILA SEROGP 1 UR AG: L. PNEUMOPHILA SEROGP 1 UR AG: NEGATIVE

## 2016-02-23 LAB — PHOSPHORUS
PHOSPHORUS: 3.6 mg/dL (ref 2.5–4.6)
Phosphorus: 1.2 mg/dL — ABNORMAL LOW (ref 2.5–4.6)

## 2016-02-23 LAB — MAGNESIUM
MAGNESIUM: 1.5 mg/dL — AB (ref 1.7–2.4)
MAGNESIUM: 2.1 mg/dL (ref 1.7–2.4)

## 2016-02-23 LAB — RPR: RPR Ser Ql: NONREACTIVE

## 2016-02-23 MED ORDER — POTASSIUM CHLORIDE 2 MEQ/ML IV SOLN
30.0000 meq | Freq: Once | INTRAVENOUS | Status: AC
  Administered 2016-02-23: 30 meq via INTRAVENOUS
  Filled 2016-02-23: qty 15

## 2016-02-23 MED ORDER — FENTANYL CITRATE (PF) 100 MCG/2ML IJ SOLN
25.0000 ug | INTRAMUSCULAR | Status: DC | PRN
  Administered 2016-02-23 – 2016-03-01 (×17): 100 ug via INTRAVENOUS
  Filled 2016-02-23 (×18): qty 2

## 2016-02-23 MED ORDER — LIDOCAINE HCL (PF) 1 % IJ SOLN
INTRAMUSCULAR | Status: AC
  Administered 2016-02-23: 17:00:00
  Filled 2016-02-23: qty 5

## 2016-02-23 MED ORDER — MAGNESIUM SULFATE 4 GM/100ML IV SOLN
4.0000 g | Freq: Once | INTRAVENOUS | Status: AC
  Administered 2016-02-23: 4 g via INTRAVENOUS
  Filled 2016-02-23: qty 100

## 2016-02-23 MED ORDER — POTASSIUM PHOSPHATES 15 MMOLE/5ML IV SOLN
30.0000 mmol | Freq: Once | INTRAVENOUS | Status: AC
  Administered 2016-02-23: 30 mmol via INTRAVENOUS
  Filled 2016-02-23: qty 10

## 2016-02-23 MED ORDER — VITAL AF 1.2 CAL PO LIQD
1000.0000 mL | ORAL | Status: DC
  Administered 2016-02-23 – 2016-03-24 (×30): 1000 mL
  Filled 2016-02-23 (×13): qty 1000

## 2016-02-23 NOTE — Progress Notes (Signed)
eLink Physician-Brief Progress Note Patient Name: [name] SchmidtChevella [name] DOB: 04/05/1970 MRN: [id number]   Date of Service  02/23/2016  HPI/Events of Note  Hypkalemia, hypomag, hypophos  eICU Interventions  Potassium, Mag and Phos replaced     Intervention Category Intermediate Interventions: Electrolyte abnormality - evaluation and management  [name] [name] 02/23/2016, 4:58 AM

## 2016-02-23 NOTE — Progress Notes (Signed)
Nutrition Follow-up  DOCUMENTATION CODES:   Not applicable  INTERVENTION:    Change to Vital AF 1.2 via PEG at 55 ml/h (1320 ml per day) to provide 1584 kcals, 99 gm protein, 1071 ml free water daily.  NUTRITION DIAGNOSIS:   Inadequate oral intake related to inability to eat as evidenced by NPO status.  Ongoing  GOAL:   Patient will meet greater than or equal to 90% of their needs   Progressing  MONITOR:   Vent status, Labs, TF tolerance, I & O's  ASSESSMENT:   45 year old with past medical history of frontotemporal dementia, restless leg syndrome. She is a nursing home resident. She was brought to the ED with altered mental status and respiratory distress. Required intubation in the ED.  Discussed patient in ICU rounds and with RN today. Propofol is now off. Currently receiving Vital High Protein via PEG at goal rate of 50 ml/h (1200 ml per day) to provide 1200 kcals, 105 gm protein, 1003 ml free water daily.  RD to adjust TF regimen now that Propofol is off. Patient is currently intubated on ventilator support MV: 9.4 L/min Temp (24hrs), Avg:98.6 F (37 C), Min:94.6 F (34.8 C), Max:99.5 F (37.5 C)  Labs and medications reviewed. CBG's: 107-120-100  Diet Order:  Diet NPO time specified  Skin:  Wound (see comment) (unstageable to R ankle)  Last BM:  12/21  Height:   Ht Readings from Last 1 Encounters:  02/22/16 5\' 5"  (1.651 m)    Weight:   Wt Readings from Last 1 Encounters:  02/23/16 149 lb 4.8 oz (67.7 kg)    Ideal Body Weight:  56.8 kg  BMI:  Body mass index is 24.84 kg/m.  Estimated Nutritional Needs:   Kcal:  1610  Protein:  90-105 gm  Fluid:  1.8 L  EDUCATION NEEDS:   No education needs identified at this time  Joaquin CourtsKimberly Harris, RD, LDN, CNSC Pager [phone] After Hours Pager [phone]

## 2016-02-23 NOTE — Progress Notes (Signed)
PULMONARY / CRITICAL CARE MEDICINE   Name: [name] [name] MRN: [id number] DOB: 09/05/1970    ADMISSION DATE:  02/22/2016 CONSULTATION DATE:  02/22/16  REFERRING MD:  Baxter HireKristen Ward MD  CHIEF COMPLAINT:  Acute respiratory failure, Multilobar PNA  HISTORY OF PRESENT ILLNESS:   [name] [name] is a 45 year old NHR with past medical history of frontotemporal dementia, restless leg syndrome. Marland Kitchen. She was brought to the ED with altered mental status and respiratory distress. She had sats in the 40s and was intubated and PCCM called for admission. Chest x-ray shows right upper lobe and right middle lobe consolidation/collapse. In the ED she has a temp of 101.2, she has been given 3 L NS and started on normal saline at 125/hour. She has received a dose of Vanco, Zosyn. She had a large bowel movement and has been placed on enteric precautions as well. ABG showed respiratory acidosis and vent rate was increased to 20. She also has leukocytosis with elevated lactic acid.  Regarding her neurologic issues. 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 and [facility]. This was felt to be more consistent with frontotemporal dementia. Given the progressive nature of this disease palliation was advised. 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 The case [facility] during her last admission in May 2017. Outpatient palliative care follow-up was recommended. However she remains full code.   SUBJECTIVE:  Critically ill intubated more awake on low dose propofol gtt No obvious pain   VITAL SIGNS: BP 137/90   Pulse 93   Temp 97.3 F (36.3 C)   Resp 17   Ht 5\' 5"  (1.651 m)   Wt 149 lb 4.8 oz (67.7 kg)   SpO2 100%   BMI 24.84 kg/m   HEMODYNAMICS:    VENTILATOR SETTINGS: Vent Mode: PRVC FiO2 (%):  [40 %-70 %] 40 % Set Rate:  [16 bmp-20 bmp] 16 bmp Vt Set:  [400 mL-460 mL] 400 mL PEEP:  [5 cmH20] 5 cmH20 Plateau  Pressure:  [21 cmH20-26 cmH20] 21 cmH20  INTAKE / OUTPUT: I/O last 3 completed shifts: In: 9133.1 [I.V.:3379.8; NG/GT:928.3; IV Piggyback:4825] Out: 3175 [Urine:2825; Emesis/NG output:350]  PHYSICAL EXAMINATION: General:  Sedated, intubated Neuro:  Awake, does not follow commands HEENT:  No thyromegaly, JVD Cardiovascular:  RRR, no MRG Lungs:  Diffuse rhonchi on right. No wheeze Abdomen:  Distended, + BS Musculoskeletal:  Contracted arms BL Skin:  Intact  LABS:  BMET  Recent Labs Lab 02/22/16 0601 02/22/16 0622 02/23/16 0409  NA 139 139 141  K 4.7 4.8 2.3*  CL 99* 102 112*  CO2  --  32 22  BUN 23* 18 9  CREATININE 0.70 0.71 0.52  GLUCOSE 289* 273* 113*    Electrolytes  Recent Labs Lab 02/22/16 0622 02/22/16 0851 02/22/16 1539 02/23/16 0409  CALCIUM 8.6*  --   --  8.0*  MG  --  1.9 1.5* 1.5*  PHOS  --  2.3* 1.3* 1.2*    CBC  Recent Labs Lab 02/22/16 0601 02/22/16 0622 02/23/16 0409  WBC  --  15.6* 15.6*  HGB 15.6* 13.4 10.8*  HCT 46.0 43.6 33.7*  PLT  --  339 217    Coag's No results for input(s): APTT, INR in the last 168 hours.  Sepsis Markers  Recent Labs Lab 02/22/16 0602 02/22/16 0844 02/22/16 1539  LATICACIDVEN 1.93* 4.0* 2.9*    ABG  Recent Labs Lab 02/22/16 [phone] [id number]  02/23/16 0325  PHART 7.107* 7.545*  PCO2ART 88.8* 24.2*  PO2ART 103.0 134*    Liver Enzymes  Recent Labs Lab 02/22/16 0622  AST 36  ALT 40  ALKPHOS 120  BILITOT 0.5  ALBUMIN 3.5    Cardiac Enzymes  Recent Labs Lab 02/22/16 0840 02/22/16 1539 02/22/16 2139  TROPONINI 0.04* <0.03 <0.03    Glucose  Recent Labs Lab 02/22/16 1139 02/22/16 1543 02/22/16 1936 02/22/16 2327 02/23/16 0359 02/23/16 0801  GLUCAP 75 113* 107* 102* 107* 120*    Imaging Ct Chest W Contrast  Result Date: 02/22/2016 CLINICAL DATA:  45 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 Code  sepsis. EXAM: CT CHEST WITH CONTRAST TECHNIQUE: Multidetector CT imaging of the chest was performed during intravenous contrast administration. CONTRAST:  75 cc ISOVUE-300 IOPAMIDOL (ISOVUE-300) INJECTION 61% COMPARISON:  Chest radiograph from earlier today. FINDINGS: Cardiovascular: Normal heart size. No significant pericardial fluid/thickening. Great vessels are normal in course and caliber. No central pulmonary emboli. Mediastinum/Nodes: No discrete thyroid nodules. Unremarkable esophagus. No pathologically enlarged axillary, mediastinal or hilar lymph nodes. Lungs/Pleura: No pneumothorax. No pleural effusion. Endotracheal tube tip is 2.8 cm above the carina. There is complete consolidation and severe volume loss in the right upper lobe. The right upper lobe bronchus is occluded with low attenuation material favoring mucous plugging. No discrete obstructing mass. There is moderate patchy consolidation and ground-glass attenuation throughout the right middle lobe, lingula and bilateral lower lobes (right greater than left), with associated mild volume loss in the lower lobes. No significant pulmonary nodules in the limited aerated portions of the lungs. Upper abdomen: Enteric tube terminates in the gastric fundus. Musculoskeletal:  No aggressive appearing focal osseous lesions. IMPRESSION: 1. Well-positioned endotracheal and enteric tubes. 2. Complete right upper lobe consolidation and significant volume loss with occlusion of the right upper lobe bronchus by low-attenuation material, favoring mucous plugging. No discrete obstructing mass. 3. Moderate patchy consolidation and ground-glass attenuation throughout the right middle lobe, lingula and bilateral lower lobes, favoring multilobar pneumonia and/or aspiration. Electronically Signed   By: Delbert PhenixJason A Poff M.D.   On: 02/22/2016 10:41   Dg Chest Port 1 View  Result Date: 02/23/2016 CLINICAL DATA:  Initial evaluation for acute respiratory failure. EXAM:  PORTABLE CHEST 1 VIEW COMPARISON:  Prior radiograph from 02/22/2016. FINDINGS: Patient remains intubated with the tip of an endotracheal tube positioned 2.9 cm above the carina. Cardiac and mediastinal silhouettes are stable, and remain within normal limits. Lungs are hypoinflated. Patchy bibasilar opacities, right greater than left, again suspicious for possible multi lobar pneumonia. Overall, these are slightly more dense as compared to the prior exam. Underlying pulmonary interstitial edema is improved. Asymmetric prominence of the pulmonary vasculature within the right lung favored to be related to patient rotation on this exam. No pneumothorax. No acute osseous abnormality. IMPRESSION: 1. Tip of the endotracheal tube 2.9 cm above the carina. 2. Shallow lung inflation with persistent right greater than left bibasilar opacities, compatible with multi lobar pneumonia. Overall, these are slightly more dense in appearance as compared to previous exam. 3. Improved/resolved underlying pulmonary interstitial edema. Electronically Signed   By: Rise MuBenjamin  McClintock M.D.   On: 02/23/2016 06:30   Dg Chest Port 1 View  Result Date: 02/22/2016 CLINICAL DATA:  Respiratory failure. EXAM: PORTABLE CHEST 1 VIEW COMPARISON:  CT chest 02/22/2016 FINDINGS: Endotracheal tube with the tip 4.5 cm above the carina. Patchy areas of airspace disease in the right upper and  lower lobes. Patchy airspace disease in the left lower lobe. No pleural effusion or pneumothorax. Stable cardiomediastinal silhouette. IMPRESSION: 1. Endotracheal tube with the tip 4.5 cm above the carina. 2. Airspace disease in the right upper, right lower and left lower lobes most consistent with multi lobar pneumonia. Electronically Signed   By: Elige Ko   On: 02/22/2016 16:03   STUDIES:  CT chest 12/21 > Pending 12/21 bscopy >> mucus plugs Rt  CULTURES: Bcx 12/21 x2 > Sputum Cx 12/21 > Stool norovirus + Stool c diff + RVP + Urine strep ag  neg  ANTIBIOTICS: Vanco 12/21 > Zosyn 12/21 > Azithro 12/21 >  SIGNIFICANT EVENTS: 12/21 > Admit with resp failure.   LINES/TUBES:  DISCUSSION: 46 year old with progressive frontotemporal dementia. Admitted with acute respiratory failure, multilobar pneumonia/ RUL atelectasis & diarrhea due to c diff / norovirus  ASSESSMENT / PLAN:  PULMONARY A: Acute hypercapnic, hypoxic respiratory failure Multilobar pneumonia/ RUL atx P:   Continue vent support Await palliative discussion  CARDIOVASCULAR A:  Sinus Tachycardia. Likely from sepsis P:  Tele   RENAL A:   Hypokaleia/ hypophos/ hypomag P:   Monitor urine output and Cr Replete lytes as needed  GASTROINTESTINAL A:   Diarrhea- C diff & norovirus P:   Enteric precautions ct TFs  HEMATOLOGIC A:   Leukocytosis from sepsis P:  Follow CBC  INFECTIOUS A:   Multilobar PNA R/O C diff P:   Continue vanco, zosyn. Add azithro for atypical coverage Follow Pct, cultures Check Urine legionella  ENDOCRINE A:   Elevated BS P:   SSI coverage  NEUROLOGIC A:   Frontotemporal dementia P:   Palliation recommended by [facility] given the progressive nature of the disease. Await discussion  FAMILY  - Updates: No family at bedside.  - Inter-disciplinary family meet or Palliative Care meeting due by:  12/28  Critical care time- 35 mins.  Cyril Mourning MD. Tonny Bollman. [facility] Pager [phone] If no response call 319 [phone]   02/23/2016

## 2016-02-23 NOTE — Progress Notes (Signed)
PHARMACY - PHYSICIAN COMMUNICATION CRITICAL VALUE ALERT - BLOOD CULTURE IDENTIFICATION (BCID)  Results for orders placed or performed during the hospital encounter of 02/22/16  Blood Culture ID Panel (Reflexed) (Collected: 02/22/2016  5:45 AM)  Result Value Ref Range   Enterococcus species NOT DETECTED NOT DETECTED   Listeria monocytogenes NOT DETECTED NOT DETECTED   Staphylococcus species DETECTED (A) NOT DETECTED   Staphylococcus aureus NOT DETECTED NOT DETECTED   Methicillin resistance DETECTED (A) NOT DETECTED   Streptococcus species NOT DETECTED NOT DETECTED   Streptococcus agalactiae NOT DETECTED NOT DETECTED   Streptococcus pneumoniae NOT DETECTED NOT DETECTED   Streptococcus pyogenes NOT DETECTED NOT DETECTED   Acinetobacter baumannii NOT DETECTED NOT DETECTED   Enterobacteriaceae species NOT DETECTED NOT DETECTED   Enterobacter cloacae complex NOT DETECTED NOT DETECTED   Escherichia coli NOT DETECTED NOT DETECTED   Klebsiella oxytoca NOT DETECTED NOT DETECTED   Klebsiella pneumoniae NOT DETECTED NOT DETECTED   Proteus species NOT DETECTED NOT DETECTED   Serratia marcescens NOT DETECTED NOT DETECTED   Haemophilus influenzae NOT DETECTED NOT DETECTED   Neisseria meningitidis NOT DETECTED NOT DETECTED   Pseudomonas aeruginosa NOT DETECTED NOT DETECTED   Candida albicans NOT DETECTED NOT DETECTED   Candida glabrata NOT DETECTED NOT DETECTED   Candida krusei NOT DETECTED NOT DETECTED   Candida parapsilosis NOT DETECTED NOT DETECTED   Candida tropicalis NOT DETECTED NOT DETECTED    Name of physician (or Provider) Contacted:   NA  Changes to prescribed antibiotics required:   None  1/2 blood cultures.  Already receiving Vancomycin and Zosyn for PNA  [name] [name], [name] [name] 02/23/2016  2:49 AM

## 2016-02-23 NOTE — Progress Notes (Signed)
   Introduced Publishing rights managerchaplaincy services.  Prayed w/ family at bedside.  Will follow, as needed.  - Rev. Chaplain Kipp Broodnthony Seyed Heffley MDiv ThM

## 2016-02-23 NOTE — Progress Notes (Signed)
CRITICAL VALUE ALERT  Critical value received:  Potassium 2.3  Date of notification:  02/23/16  Time of notification:  0500  Critical value read back: yes  Nurse who received alert:  Roselie AwkwardMegan Roslin Norwood, RN   MD notified (1st page):  Dr. Darrick Pennaeterding  Time of first page:  0503  MD notified (2nd page): N/A  Time of second page: N/A  Responding MD:  Dr. Darrick Pennaeterding  Time MD responded:  [phone]

## 2016-02-23 NOTE — Progress Notes (Signed)
Called chaplin service and stated that no chaplin came by (as it states in previous note), and family at the bedside since 12 pm waiting for spiritual support. [place] other city so "not leaving yet until speaking to chaplin"

## 2016-02-23 NOTE — Procedures (Signed)
Interventional Radiology Procedure Note  Procedure: Left IJ triple lumen catheter.  19cm.  OK for use..  Complications: None Recommendations:  - Ok to use - Do not submerge  - Routine line care   Signed,  Yvone NeuJaime S. Loreta AveWagner, DO

## 2016-02-23 NOTE — Progress Notes (Signed)
[facility] team called, said if patient contracted, it should be done by IR. MD notified and aware

## 2016-02-23 NOTE — Progress Notes (Signed)
   02/23/16 1421  Clinical Encounter Type  Visited With Patient  Visit Type Initial  Referral From Family  Consult/Referral To Chaplain  Recommendations (follow-up)  Spiritual Encounters  Spiritual Needs Prayer  Stress Factors  Patient Stress Factors Health changes  Family Stress Factors Family relationships;Health changes  Pt. Asleep, no family immediately available at moment, recommend a follow up.  Will pass forward for prayer.  CHS IncChaplain Samer Dutton [phone]

## 2016-02-23 NOTE — Progress Notes (Signed)
Dr. Vassie LollAlva paged. Called black box and spoke to Dr. Marchelle Gearingamaswamy about central line placement instead of PICC in IR. Explained that PICC is  unable to place due to pt's contractions.  It is ok per MD. Consent signed by sister (guardian).

## 2016-02-23 NOTE — Progress Notes (Signed)
Infection control called and ok to DC droplet precautions

## 2016-02-24 ENCOUNTER — Inpatient Hospital Stay (HOSPITAL_COMMUNITY): Payer: Medicare Other

## 2016-02-24 LAB — CBC
HEMATOCRIT: 31.3 % — AB (ref 36.0–46.0)
HEMOGLOBIN: 10 g/dL — AB (ref 12.0–15.0)
MCH: 26.2 pg (ref 26.0–34.0)
MCHC: 31.9 g/dL (ref 30.0–36.0)
MCV: 81.9 fL (ref 78.0–100.0)
Platelets: 262 10*3/uL (ref 150–400)
RBC: 3.82 MIL/uL — ABNORMAL LOW (ref 3.87–5.11)
RDW: 16.8 % — ABNORMAL HIGH (ref 11.5–15.5)
WBC: 17.9 10*3/uL — AB (ref 4.0–10.5)

## 2016-02-24 LAB — BASIC METABOLIC PANEL
ANION GAP: 8 (ref 5–15)
BUN: 8 mg/dL (ref 6–20)
CHLORIDE: 108 mmol/L (ref 101–111)
CO2: 27 mmol/L (ref 22–32)
Calcium: 8.1 mg/dL — ABNORMAL LOW (ref 8.9–10.3)
Creatinine, Ser: 0.46 mg/dL (ref 0.44–1.00)
GFR calc non Af Amer: >60 mL/min (ref >60)
Glucose, Bld: 117 mg/dL — ABNORMAL HIGH (ref 65–99)
Potassium: 2.9 mmol/L — ABNORMAL LOW (ref 3.5–5.1)
Sodium: 143 mmol/L (ref 135–145)

## 2016-02-24 LAB — TRIGLYCERIDES: Triglycerides: 69 mg/dL (ref <150)

## 2016-02-24 LAB — GLUCOSE, CAPILLARY
GLUCOSE-CAPILLARY: 109 mg/dL — AB (ref 65–99)
GLUCOSE-CAPILLARY: 116 mg/dL — AB (ref 65–99)
GLUCOSE-CAPILLARY: 114 mg/dL — AB (ref 65–99)
Glucose-Capillary: 126 mg/dL — ABNORMAL HIGH (ref 65–99)
Glucose-Capillary: 97 mg/dL (ref 65–99)

## 2016-02-24 LAB — VANCOMYCIN, TROUGH: VANCOMYCIN TR: 4 ug/mL — AB (ref 15–20)

## 2016-02-24 MED ORDER — SODIUM CHLORIDE 0.9 % IV SOLN
1250.0000 mg | Freq: Once | INTRAVENOUS | Status: AC
  Administered 2016-02-24: 1250 mg via INTRAVENOUS
  Filled 2016-02-24: qty 1250

## 2016-02-24 MED ORDER — VANCOMYCIN HCL IN DEXTROSE 1-5 GM/200ML-% IV SOLN
1000.0000 mg | Freq: Three times a day (TID) | INTRAVENOUS | Status: DC
  Administered 2016-02-24 – 2016-02-25 (×2): 1000 mg via INTRAVENOUS
  Filled 2016-02-24 (×3): qty 200

## 2016-02-24 MED ORDER — POTASSIUM CHLORIDE 20 MEQ/15ML (10%) PO SOLN
40.0000 meq | ORAL | Status: AC
  Administered 2016-02-24 (×2): 40 meq
  Filled 2016-02-24 (×2): qty 30

## 2016-02-24 NOTE — Progress Notes (Signed)
Pharmacy Antibiotic Note  [name] SchmidtChevella [name] is a 45 y.o. female admitted on 02/22/2016 with  possible pneumonia (also with Cdiff and Norovirus).  Pharmacy has been consulted for Vancomycin and Zosyn dosing. WBC up 17.9, SCr stable. Tmax 101.   Vancomycin trough 4 on 750mg  IV q8h--  Patient auto-diuresed per RN yesterday.  Will re-load and increase maintenance dose.   Plan: Reload Vancomycin 1250mg  IV x1 now, then 1000mg  IV q8h.  Monitor vancomycin trough at Css. Continue Zosyn 3.375g IV q8h- 4hr infusion. Monitor renal function, clinical status, and culture results.   Height: 5\' 5"  (165.1 cm) Weight: 145 lb 6.4 oz (66 kg) IBW/kg (Calculated) : 57  Temp (24hrs), Avg:99.4 F (37.4 C), Min:96.3 F (35.7 C), Max:101.5 F (38.6 C)   Recent Labs Lab 02/22/16 0601 02/22/16 0602 02/22/16 0622 02/22/16 0844 02/22/16 1539 02/23/16 0409 02/23/16 1728 02/24/16 0330 02/24/16 0930  WBC  --   --  15.6*  --   --  15.6*  --  17.9*  --   CREATININE 0.70  --  0.71  --   --  0.52 0.54 0.46  --   LATICACIDVEN  --  1.93*  --  4.0* 2.9*  --   --   --   --   VANCOTROUGH  --   --   --   --   --   --   --   --  4*    Estimated Creatinine Clearance: 79.1 mL/min (by C-G formula based on SCr of 0.46 mg/dL).    Allergies  Allergen Reactions  . Sulfa Antibiotics Other (See Comments)    Per MAR    Antimicrobials this admission: Vancomycin 12/21>> Zosyn 12/21>> Azithromycin 12/21 >> 12/23 PO Vanc 12/21 >>  Dose adjustments this admission: 12/23 VT 4 - Reload and increase dose.   Microbiology results: 12/21BCx: 1/2 GPC clusters (BCID- CONS) 12/21UCx: multiple species, none predominant 12/21 BAL: re-incubated 12/21 C. Diff Ag positive, toxin negative, PCR positive 12/21: Legionella UAg: ip 12/21: Strep UAg: negative 12/21: RVP: negative 12/21 GI Panel: + Norovirus 12/21: MRSA PCR negative 12/21: Influenza PCR negative  Thank you for allowing pharmacy to be a part of this  patient's care.  Link SnufferJessica Deatrice Spanbauer, PharmD, BCPS Clinical Pharmacist [phone] 02/24/2016 11:00 AM

## 2016-02-24 NOTE — Progress Notes (Signed)
[facility] REPLAC[facility] ONLY  The patient does not apply for the [facility] Electrolyte Replacment Protocol based on the criteria listed below:   1. Is GFR >/= 40 ml/min? Yes.    Patient's GFR today is >60 2. Is urine output >/= 0.5 ml/kg/hr for the last 6 hours? No. Patient's UOP is 0.2 ml/kg/hr 3. Is BUN < 60 mg/dL? Yes.    Patient's BUN today is 8 4. Abnormal electrolyte(s): K+ 2.9 5. Ordered repletion with: NA 6. If a panic level lab has been reported, has the CCM MD in charge been notified? Yes.  .   Physician:  E Deterding  [name] [name], [name] [name] 02/24/2016 4:49 AM

## 2016-02-24 NOTE — Progress Notes (Signed)
Sats dropped to 40s. RT at the bedside Pt lavaged. Stable 99 at this time

## 2016-02-24 NOTE — Progress Notes (Signed)
PULMONARY / CRITICAL CARE MEDICINE   Name: [name] [name] MRN: [id number] DOB: 1970-05-26    ADMISSION DATE:  02/22/2016 CONSULTATION DATE:  02/22/16  REFERRING MD:  Baxter Hire Ward MD  CHIEF COMPLAINT:  Acute respiratory failure, Multilobar PNA  HISTORY OF PRESENT ILLNESS:   [name] [name] is a 45 year old NHR with past medical history of frontotemporal dementia, restless leg syndrome. Marland Kitchen She was brought to the ED with altered mental status and respiratory distress. She had sats in the 40s and was intubated and PCCM called for admission. Chest x-ray shows right upper lobe and right middle lobe consolidation/collapse. In the ED she has a temp of 101.2, she has been given 3 L NS and started on normal saline at 125/hour. She has received a dose of Vanco, Zosyn. She had a large bowel movement and has been placed on enteric precautions as well. ABG showed respiratory acidosis and vent rate was increased to 20. She also has leukocytosis with elevated lactic acid.  Regarding her neurologic issues. 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 and has been evaluated by [facility] neurology in 2015. This was felt to be more consistent with frontotemporal dementia. Given the progressive nature of this disease palliation was advised. 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 The case was discussed with palliative care team at [facility] during her last admission in May 2017. Outpatient palliative care follow-up was recommended. However she remains full code.   SUBJECTIVE:  Critically ill intubated Off sedation Febrile 101 RN perceives  Pain due to shaking movements Polyuric overnight   VITAL SIGNS: BP 91/69   Pulse (!) 101   Temp 97.3 F (36.3 C)   Resp 16   Ht 5\' 5"  (1.651 m)   Wt 145 lb 6.4 oz (66 kg)   SpO2 100%   BMI 24.20 kg/m   HEMODYNAMICS:    VENTILATOR SETTINGS: Vent Mode: PRVC FiO2 (%):  [40 %] 40 % Set Rate:  [16 bmp] 16 bmp Vt Set:  [400 mL] 400 mL PEEP:  [5 cmH20] 5  cmH20 Plateau Pressure:  [19 cmH20-32 cmH20] 20 cmH20  INTAKE / OUTPUT: I/O last 3 completed shifts: In: 6359.9 [I.V.:3123.2; Other:100; NG/GT:1161.7; IV Piggyback:1975] Out: 5890 [Urine:5890]  PHYSICAL EXAMINATION: General:  Sedated, intubated Neuro: eyes open, does not follow commands HEENT:  No thyromegaly, JVD Cardiovascular:  RRR, no MRG Lungs:  Diffuse rhonchi on right. No wheeze Abdomen:  Distended, + BS Musculoskeletal:  Contracted arms BL Skin:  Intact  LABS:  BMET  Recent Labs Lab 02/23/16 0409 02/23/16 1728 02/24/16 0330  NA 141 143 143  K 2.3* 3.2* 2.9*  CL 112* 108 108  CO2 22 26 27   BUN 9 5* 8  CREATININE 0.52 0.54 0.46  GLUCOSE 113* 114* 117*    Electrolytes  Recent Labs Lab 02/22/16 1539 02/23/16 0409 02/23/16 1727 02/23/16 1728 02/24/16 0330  CALCIUM  --  8.0*  --  8.3* 8.1*  MG 1.5* 1.5* 2.1  --   --   PHOS 1.3* 1.2* 3.6  --   --     CBC  Recent Labs Lab 02/22/16 0622 02/23/16 0409 02/24/16 0330  WBC 15.6* 15.6* 17.9*  HGB 13.4 10.8* 10.0*  HCT 43.6 33.7* 31.3*  PLT 339 217 262    Coag's No results for input(s): APTT, INR in the last 168 hours.  Sepsis Markers  Recent Labs Lab 02/22/16 0602 02/22/16 0844 02/22/16 1539  LATICACIDVEN 1.93* 4.0* 2.9*    ABG  Recent  Labs Lab 02/22/16 0639 02/23/16 0325  PHART 7.107* 7.545*  PCO2ART 88.8* 24.2*  PO2ART 103.0 134*    Liver Enzymes  Recent Labs Lab 02/22/16 0622  AST 36  ALT 40  ALKPHOS 120  BILITOT 0.5  ALBUMIN 3.5    Cardiac Enzymes  Recent Labs Lab 02/22/16 0840 02/22/16 1539 02/22/16 2139  TROPONINI 0.04* <0.03 <0.03    Glucose  Recent Labs Lab 02/23/16 1156 02/23/16 1700 02/23/16 1940 02/23/16 2337 02/24/16 0350 02/24/16 0750  GLUCAP 100* 106* 140* 108* 109* 116*    Imaging Ir Fluoro Guide Cv Line Left  Result Date: 02/23/2016 INDICATION: 45 year old female with multi lobar pneumonia. EXAM: IR LEFT FLOURO GUIDE CV LINE; IR  ULTRASOUND GUIDANCE VASC ACCESS LEFT MEDICATIONS: None ANESTHESIA/SEDATION: None FLUOROSCOPY TIME:  Fluoroscopy Time: 0 minutes 6 seconds (1 mGy). COMPLICATIONS: None PROCEDURE: Informed written consent was obtained from the patient's family after a thorough discussion of the procedural risks, benefits and alternatives. All questions were addressed. Maximal Sterile Barrier Technique was utilized including caps, mask, sterile gowns, sterile gloves, sterile drape, hand hygiene and skin antiseptic. A timeout was performed prior to the initiation of the procedure. Ultrasound survey of the left jugular region was performed with images stored and sent to PACs. The patient is prepped and draped in the usual sterile fashion. Skin and subcutaneous tissues were generously infiltrated 1% lidocaine for local anesthesia. Using ultrasound guidance, micropuncture set was used access the left internal jugular vein. Micro wire was passed through the needle into the right heart. Small incision was made in the needle was removed. Peel-away sheath was then placed, and a length of 19 cm was estimated. Triple-lumen 5 French catheter was then ligated at 19 cm and passed through the peel-away sheath. Peel-away sheath was removed and the catheter was sutured in position and flushed. Final image was stored. Patient tolerated the procedure well and remained hemodynamically stable throughout. No complications were encountered and no significant blood loss. IMPRESSION: Status post left IJ central catheter placement. Catheter ready for use. Signed, Yvone NeuJaime S. Loreta AveWagner, DO [facility] Electronically Signed   By: Gilmer MorJaime  Wagner D.O.   On: 02/23/2016 17:03   Ir Koreas Guide Vasc Access Left  Result Date: 02/23/2016 INDICATION: 45 year old female with multi lobar pneumonia. EXAM: IR LEFT FLOURO GUIDE CV LINE; IR ULTRASOUND GUIDANCE VASC ACCESS LEFT MEDICATIONS: None ANESTHESIA/SEDATION: None  FLUOROSCOPY TIME:  Fluoroscopy Time: 0 minutes 6 seconds (1 mGy). COMPLICATIONS: None PROCEDURE: Informed written consent was obtained from the patient's family after a thorough discussion of the procedural risks, benefits and alternatives. All questions were addressed. Maximal Sterile Barrier Technique was utilized including caps, mask, sterile gowns, sterile gloves, sterile drape, hand hygiene and skin antiseptic. A timeout was performed prior to the initiation of the procedure. Ultrasound survey of the left jugular region was performed with images stored and sent to PACs. The patient is prepped and draped in the usual sterile fashion. Skin and subcutaneous tissues were generously infiltrated 1% lidocaine for local anesthesia. Using ultrasound guidance, micropuncture set was used access the left internal jugular vein. Micro wire was passed through the needle into the right heart. Small incision was made in the needle was removed. Peel-away sheath was then placed, and a length of 19 cm was estimated. Triple-lumen 5 French catheter was then ligated at 19 cm and passed through the peel-away sheath. Peel-away sheath was removed and the catheter was sutured in position and flushed. Final image was stored. Patient  tolerated the procedure well and remained hemodynamically stable throughout. No complications were encountered and no significant blood loss. IMPRESSION: Status post left IJ central catheter placement. Catheter ready for use. Signed, Yvone NeuJaime S. Loreta AveWagner, DO [facility] Electronically Signed   By: Gilmer MorJaime  Wagner D.O.   On: 02/23/2016 17:03   Dg Chest Port 1 View  Result Date: 02/24/2016 CLINICAL DATA:  Acute respiratory failure. EXAM: PORTABLE CHEST 1 VIEW COMPARISON:  02/23/2016 and chest CT 02/22/2016 FINDINGS: Endotracheal tube unchanged. Interval placement of left IJ central venous catheter which has tip at the cavoatrial junction. Patient slightly  rotated to the right. Lungs are hypoinflated with mild stable bibasilar opacification which may be due to atelectasis or infection. Increased density in the right hilar region slightly worse. No pneumothorax. Cardiomediastinal silhouette and remainder of the exam is unchanged. IMPRESSION: Persistent bibasilar opacification with slight worsening right hilar opacification suggesting atelectasis or infection. Tubes and lines as described. Electronically Signed   By: Elberta Fortisaniel  Boyle M.D.   On: 02/24/2016 08:44   STUDIES:  CT chest 12/21 > Pending 12/21 bscopy >> mucus plugs Rt  CULTURES: Bcx 12/21 x2 > Sputum Cx 12/21 > Stool norovirus + Stool c diff + RVP + Urine strep ag neg Urine legionella neg  ANTIBIOTICS: Vanco 12/21 > Zosyn 12/21 > Azithro 12/21 >  SIGNIFICANT EVENTS: 12/21 > Admit with resp failure.   LINES/TUBES:  DISCUSSION: 45 year old with progressive frontotemporal dementia. Admitted with acute respiratory failure, multilobar pneumonia/ RUL atelectasis & diarrhea due to c diff / norovirus  ASSESSMENT / PLAN:  PULMONARY A: Acute hypercapnic, hypoxic respiratory failure Multilobar pneumonia/ RUL atx P:   Continue vent support, Attempt SBts but doubt extubatable Ongoing palliative discussion but sister POa leaning towards tstomy  CARDIOVASCULAR A:  Sinus Tachycardia. Likely from sepsis P:  Tele   RENAL A:   Hypokaleia/ hypophos/ hypomag P:   Monitor urine output and Cr Replete lytes as needed  GASTROINTESTINAL A:   Diarrhea- C diff & norovirus P:   Enteric precautions ct TFs  HEMATOLOGIC A:   Leukocytosis from sepsis P:  Follow CBC  INFECTIOUS A:   Multilobar PNA C diff colitis P:   Continue vanco, zosyn. dc azithro  Can dc vanc if BAL neg for MRSA   ENDOCRINE A:   Elevated BS P:   SSI coverage  NEUROLOGIC A:   Frontotemporal dementia P:   Palliation recommended by [facility] given the progressive nature of the disease.   FAMILY  -  Updates: sister - Inter-disciplinary family meet or Palliative Care meeting due by:  12/28  Critical care time- 31 mins.  Cyril Mourningakesh Sutton Plake MD. Tonny BollmanFCCP. [facility] [facility] Pager [phone] If no response call [phone]   02/24/2016

## 2016-02-24 NOTE — Progress Notes (Signed)
eLink Physician-Brief Progress Note Patient Name: [name] SchmidtChevella [name] DOB: 04/01/1970 MRN: [id number]   Date of Service  02/24/2016  HPI/Events of Note  hypokalemia  eICU Interventions  Potassium replaced     Intervention Category Intermediate Interventions: Electrolyte abnormality - evaluation and management  [name] 02/24/2016, 4:56 AM

## 2016-02-25 ENCOUNTER — Inpatient Hospital Stay (HOSPITAL_COMMUNITY): Payer: Medicare Other

## 2016-02-25 LAB — CBC
HCT: 29.7 % — ABNORMAL LOW (ref 36.0–46.0)
Hemoglobin: 9.3 g/dL — ABNORMAL LOW (ref 12.0–15.0)
MCH: 25.9 pg — AB (ref 26.0–34.0)
MCHC: 31.3 g/dL (ref 30.0–36.0)
MCV: 82.7 fL (ref 78.0–100.0)
PLATELETS: 251 10*3/uL (ref 150–400)
RBC: 3.59 MIL/uL — AB (ref 3.87–5.11)
RDW: 17 % — ABNORMAL HIGH (ref 11.5–15.5)
WBC: 16.9 10*3/uL — AB (ref 4.0–10.5)

## 2016-02-25 LAB — BASIC METABOLIC PANEL
Anion gap: 6 (ref 5–15)
BUN: 12 mg/dL (ref 6–20)
CO2: 26 mmol/L (ref 22–32)
Calcium: 8.6 mg/dL — ABNORMAL LOW (ref 8.9–10.3)
Chloride: 112 mmol/L — ABNORMAL HIGH (ref 101–111)
Creatinine, Ser: 0.56 mg/dL (ref 0.44–1.00)
GFR calc Af Amer: >60 mL/min (ref >60)
Glucose, Bld: 110 mg/dL — ABNORMAL HIGH (ref 65–99)
POTASSIUM: 3.5 mmol/L (ref 3.5–5.1)
SODIUM: 144 mmol/L (ref 135–145)

## 2016-02-25 LAB — GLUCOSE, CAPILLARY
GLUCOSE-CAPILLARY: 110 mg/dL — AB (ref 65–99)
GLUCOSE-CAPILLARY: 120 mg/dL — AB (ref 65–99)
GLUCOSE-CAPILLARY: 135 mg/dL — AB (ref 65–99)
Glucose-Capillary: 118 mg/dL — ABNORMAL HIGH (ref 65–99)
Glucose-Capillary: 124 mg/dL — ABNORMAL HIGH (ref 65–99)
Glucose-Capillary: 127 mg/dL — ABNORMAL HIGH (ref 65–99)

## 2016-02-25 LAB — CULTURE, BAL-QUANTITATIVE W GRAM STAIN
Culture: >=100000 — AB
Special Requests: NORMAL

## 2016-02-25 LAB — CULTURE, BLOOD (ROUTINE X 2)

## 2016-02-25 LAB — CULTURE, BAL-QUANTITATIVE

## 2016-02-25 LAB — PHOSPHORUS: Phosphorus: 3.4 mg/dL (ref 2.5–4.6)

## 2016-02-25 LAB — MAGNESIUM: MAGNESIUM: 1.8 mg/dL (ref 1.7–2.4)

## 2016-02-25 MED ORDER — VANCOMYCIN HCL IN DEXTROSE 1-5 GM/200ML-% IV SOLN
1000.0000 mg | Freq: Three times a day (TID) | INTRAVENOUS | Status: DC
  Administered 2016-02-25 – 2016-02-26 (×2): 1000 mg via INTRAVENOUS
  Filled 2016-02-25 (×3): qty 200

## 2016-02-25 MED ORDER — POTASSIUM CHLORIDE 20 MEQ/15ML (10%) PO SOLN
20.0000 meq | ORAL | Status: AC
  Administered 2016-02-25 (×2): 20 meq
  Filled 2016-02-25 (×2): qty 15

## 2016-02-25 NOTE — Progress Notes (Signed)
[facility] FOR AM LAB REPLACEMENT ONLY  The patient does apply for the [facility] Electrolyte Replacment Protocol based on the criteria listed below:   1. Is GFR >/= 40 ml/min? Yes.    Patient's GFR today is >60 2. Is urine output >/= 0.5 ml/kg/hr for the last 6 hours? Yes.   Patient's UOP is 0.5 ml/kg/hr 3. Is BUN < 60 mg/dL? Yes.    Patient's BUN today is 12 4. Abnormal electrolyte(s): K+3.5 5. Ordered repletion with: Protocol 6. If a panic level lab has been reported, has the CCM MD in charge been notified? No..   Physician:  Wyline MoodE Deterding  Arizbeth C[facility]orn [facility] 02/25/2016 6:07 AM

## 2016-02-25 NOTE — Progress Notes (Signed)
PULMONARY / CRITICAL CARE MEDICINE   Name: [name] [name] MRN: [id number] DOB: 01/06/1971    ADMISSION DATE:  02/22/2016 CONSULTATION DATE:  02/22/16  REFERRING MD:  Baxter HireKristen Ward MD  CHIEF COMPLAINT:  Acute respiratory failure, Multilobar PNA  HISTORY OF PRESENT ILLNESS:   [name] [name] is a 45 year old NHR with past medical history of frontotemporal dementia, restless leg syndrome. Marland Kitchen. She was brought to the ED with altered mental status and respiratory distress. She had sats in the 40s and was intubated and PCCM called for admission. Chest x-ray shows right upper lobe and right middle lobe consolidation/collapse. In the ED she has a temp of 101.2, she has been given 3 L NS and started on normal saline at 125/hour. She has received a dose of Vanco, Zosyn. She had a large bowel movement and has been placed on enteric precautions as well. ABG showed respiratory acidosis and vent rate was increased to 20. She also has leukocytosis with elevated lactic acid.  Regarding her neurologic issues. She has a diagnosis of [name] [name] in the chart. and [facility] This was felt to be more consistent with frontotemporal dementia. Given the progressive nature of this [name] palliation was advised. 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 [facility] last admission in May 2017. Outpatient palliative care follow-up was recommended. However she remains full code.   SUBJECTIVE:  Critically ill intubated Off sedation drips Low gr Febrile     VITAL SIGNS: BP 120/82 (BP Location: Left Arm)   Pulse (!) 109   Temp 99.5 F (37.5 C)   Resp (!) 21   Ht 5\' 5"  (1.651 m)   Wt 146 lb 4.8 oz (66.4 kg)   SpO2 100%   BMI 24.35 kg/m   HEMODYNAMICS:    VENTILATOR SETTINGS: Vent Mode: PRVC FiO2 (%):  [40 %] 40 % Set Rate:  [16 bmp] 16 bmp Vt Set:  [400 mL] 400 mL PEEP:  [5 cmH20] 5 cmH20 Plateau Pressure:   [16 cmH20-25 cmH20] 20 cmH20  INTAKE / OUTPUT: I/O last 3 completed shifts: In: 3950 [I.V.:600; Other:320; NG/GT:1980; IV Piggyback:1050] Out: 1825 [Urine:1825]  PHYSICAL EXAMINATION: General:  Chr ill , intubated Neuro: eyes open, does not follow commands HEENT:  No thyromegaly, JVD Cardiovascular:  RRR, no MRG Lungs:  Diffuse rhonchi on right. No wheeze Abdomen:  Distended, + BS Musculoskeletal:  Contracted arms BL Skin:  Intact  LABS:  BMET  Recent Labs Lab 02/23/16 1728 02/24/16 0330 02/25/16 0430  NA 143 143 144  K 3.2* 2.9* 3.5  CL 108 108 112*  CO2 26 27 26   BUN 5* 8 12  CREATININE 0.54 0.46 0.56  GLUCOSE 114* 117* 110*    Electrolytes  Recent Labs Lab 02/23/16 0409 02/23/16 1727 02/23/16 1728 02/24/16 0330 02/25/16 0430  CALCIUM 8.0*  --  8.3* 8.1* 8.6*  MG 1.5* 2.1  --   --  1.8  PHOS 1.2* 3.6  --   --  3.4    CBC  Recent Labs Lab 02/23/16 0409 02/24/16 0330 02/25/16 0430  WBC 15.6* 17.9* 16.9*  HGB 10.8* 10.0* 9.3*  HCT 33.7* 31.3* 29.7*  PLT 217 262 251    Coag's No results for input(s): APTT, INR in the last 168 hours.  Sepsis Markers  Recent Labs Lab 02/22/16 0602 02/22/16 0844 02/22/16 1539  LATICACIDVEN 1.93* 4.0* 2.9*    ABG  Recent Labs  Lab 02/22/16 0639 02/23/16 0325  PHART 7.107* 7.545*  PCO2ART 88.8* 24.2*  PO2ART 103.0 134*    Liver Enzymes  Recent Labs Lab 02/22/16 0622  AST 36  ALT 40  ALKPHOS 120  BILITOT 0.5  ALBUMIN 3.5    Cardiac Enzymes  Recent Labs Lab 02/22/16 0840 02/22/16 1539 02/22/16 2139  TROPONINI 0.04* <0.03 <0.03    Glucose  Recent Labs Lab 02/24/16 1125 02/24/16 1554 02/24/16 1958 02/25/16 0016 02/25/16 0409 02/25/16 0823  GLUCAP 126* 97 114* 127* 118* 124*    Imaging Dg Chest Port 1 View  Result Date: 02/25/2016 CLINICAL DATA:  Acute respiratory failure.  Endotracheal tube. EXAM: PORTABLE CHEST 1 VIEW COMPARISON:  02/24/2016 FINDINGS: Endotracheal tube  and left IJ central venous catheter are unchanged. Lungs are hypoinflated with minimal right base opacification unchanged likely atelectasis. Subtle prominence of the perihilar markings. Cardiomediastinal silhouette and remainder of the exam is unchanged. IMPRESSION: Stable mild right base opacification likely atelectasis. Suggestion of minimal vascular congestion. Tubes and lines unchanged. Electronically Signed   By: Elberta Fortisaniel  Boyle M.D.   On: 02/25/2016 08:01   STUDIES:  CT chest 12/21 > Complete right upper lobe consolidation and significant volume loss with occlusion of the right upper lobe bronchus by low-attenuation material, favoring mucous plugging. No discrete obstructing mass. 3. Moderate patchy consolidation and ground-glass attenuation throughout the right middle lobe, lingula and bilateral lower lobes, favoring multilobar pneumonia and/or aspiration 12/21 bscopy >> mucus plugs Rt  CULTURES: Bcx 12/21 >> coag neg staph 1/2 Sputum Cx 12/21 > pseudomonas >> Stool norovirus + Stool c diff + RVP + Urine strep ag neg Urine legionella neg  ANTIBIOTICS: Vanco 12/21 >12/24 Zosyn 12/21 > Azithro 12/21 >12/23  SIGNIFICANT EVENTS: 12/21 > Admit with resp failure.   LINES/TUBES:  DISCUSSION: 45 year old with progressive frontotemporal dementia. Admitted with acute respiratory failure, multilobar  GNR pneumonia/ RUL atelectasis & diarrhea due to c diff / norovirus  ASSESSMENT / PLAN:  PULMONARY A: Acute hypercapnic, hypoxic respiratory failure Multilobar pneumonia/ RUL atx P:   Continue vent support, Attempt SBts but doubt extubatable Ongoing palliative discussion but sister POA leaning towards tstomy -not sur ethis is the best for her  CARDIOVASCULAR A:  Sinus Tachycardia. Likely from sepsis P:  Tele   RENAL A:   Hypokaleia/ hypophos/ hypomag P:   Monitor urine output and Cr Replete lytes as needed  GASTROINTESTINAL A:   Diarrhea- C diff & norovirus P:    Enteric precautions ct TFs  HEMATOLOGIC A:   Leukocytosis from sepsis P:  Follow CBC  INFECTIOUS A:   Multilobar PNA -pseudomonas C diff colitis P:   Continue PO vanco, zosyn. dc azithro  FU sens  dc  IV vanc   ENDOCRINE A:   Elevated BS P:   SSI coverage  NEUROLOGIC A:   Frontotemporal dementia P:   Palliation recommended by [facility] given the progressive nature of the [name].   FAMILY  - Updates: sister 12/23  - Inter-disciplinary family meet or Palliative Care meeting due by:  12/28, consulted  Critical care time- 31 mins.  Cyril Mourningakesh Rayshon Albaugh MD. Tonny BollmanFCCP. [place] [facility] Pager [phone] If no response call [phone]   02/25/2016

## 2016-02-25 NOTE — Progress Notes (Signed)
Pharmacy Antibiotic Note  [name] SchmidtChevella [name] is a 45 y.o. female admitted on 02/22/2016 with  possible pneumonia (also with Cdiff and Norovirus).  Pharmacy has been consulted for Vancomycin and Zosyn dosing. WBC up 17.9, SCr stable. Tmax 101.   Vancomycin was stopped in the morning on 12/24 and restarted on the evening of 12/24 due to BAL resulting in MRSA (and also pseudomonas aeruginosa).   Vancomycin trough was 4 on 750mg  IV q8h--  Patient auto-diuresed per RN yesterday.  Patient was re-loaded yesterday afternoon and has since got two doses of his maintenance dose.  Given load yesterday, will not reload today and restart at last maintenance dose.   Plan: Restart vancomycin 1000mg  IV q8h.  Monitor vancomycin trough at Css. Continue Zosyn 3.375g IV q8h- 4hr infusion. Monitor renal function, clinical status, and culture results.   Height: 5\' 5"  (165.1 cm) Weight: 146 lb 4.8 oz (66.4 kg) IBW/kg (Calculated) : 57  Temp (24hrs), Avg:99.4 F (37.4 C), Min:94.1 F (34.5 C), Max:100.4 F (38 C)   Recent Labs Lab 02/22/16 0602 02/22/16 0622 02/22/16 0844 02/22/16 1539 02/23/16 0409 02/23/16 1728 02/24/16 0330 02/24/16 0930 02/25/16 0430  WBC  --  15.6*  --   --  15.6*  --  17.9*  --  16.9*  CREATININE  --  0.71  --   --  0.52 0.54 0.46  --  0.56  LATICACIDVEN 1.93*  --  4.0* 2.9*  --   --   --   --   --   VANCOTROUGH  --   --   --   --   --   --   --  4*  --     Estimated Creatinine Clearance: 79.1 mL/min (by C-G formula based on SCr of 0.56 mg/dL).    Allergies  Allergen Reactions  . Sulfa Antibiotics Other (See Comments)    Per MAR    Antimicrobials this admission: Vancomycin 12/21>> Zosyn 12/21>> Azithromycin 12/21 >> 12/23 PO Vanc 12/21 >>  Dose adjustments this admission: 12/23 VT 4 - Reload and increase dose.   Microbiology results: 12/21BCx: 1/2 GPC clusters (BCID- CONS) 12/21UCx: multiple species, none predominant 12/21 BAL: MRSA, pseudomonas  aeruginosa (I-gent, S-all else) 12/21 C. Diff Ag positive, toxin negative, PCR positive 12/21: Legionella UAg: ip 12/21: Strep UAg: negative 12/21: RVP: negative 12/21 GI Panel: + Norovirus 12/21: MRSA PCR negative 12/21: Influenza PCR negative  Thank you for allowing pharmacy to be a part of this patient's care.  Carylon PerchesMaggie Belvia Gotschall, PharmD Acute Care Pharmacy Resident  Pager: [phone] 02/25/2016

## 2016-02-25 NOTE — Progress Notes (Signed)
eLink Physician-Brief Progress Note Patient Name: [name] SchmidtChevella Messenger DOB: 06/09/1970 MRN: [id number]   Date of Service  02/25/2016  HPI/Events of Note  BAL resulted with pseudomonas and MRSA Also has norovirus and C diff Pt on IV Zosyn, PO vanco, IV vanco was stopped today.  eICU Interventions  Will restart IV vanco for MRSA in BAL Continue zosyn and PO vanco        Shaquina Gillham 02/25/2016, 6:13 PM

## 2016-02-26 ENCOUNTER — Inpatient Hospital Stay (HOSPITAL_COMMUNITY): Payer: Medicare Other

## 2016-02-26 LAB — GLUCOSE, CAPILLARY
GLUCOSE-CAPILLARY: 116 mg/dL — AB (ref 65–99)
GLUCOSE-CAPILLARY: 123 mg/dL — AB (ref 65–99)
GLUCOSE-CAPILLARY: 124 mg/dL — AB (ref 65–99)
Glucose-Capillary: 130 mg/dL — ABNORMAL HIGH (ref 65–99)
Glucose-Capillary: 121 mg/dL — ABNORMAL HIGH (ref 65–99)
Glucose-Capillary: 115 mg/dL — ABNORMAL HIGH (ref 65–99)

## 2016-02-26 LAB — BASIC METABOLIC PANEL
Anion gap: 6 (ref 5–15)
BUN: 20 mg/dL (ref 6–20)
CHLORIDE: 111 mmol/L (ref 101–111)
CO2: 29 mmol/L (ref 22–32)
CREATININE: 1.46 mg/dL — AB (ref 0.44–1.00)
Calcium: 8.7 mg/dL — ABNORMAL LOW (ref 8.9–10.3)
GFR calc Af Amer: 49 mL/min — ABNORMAL LOW (ref >60)
GFR calc non Af Amer: 42 mL/min — ABNORMAL LOW (ref >60)
Glucose, Bld: 161 mg/dL — ABNORMAL HIGH (ref 65–99)
Potassium: 3.2 mmol/L — ABNORMAL LOW (ref 3.5–5.1)
SODIUM: 146 mmol/L — AB (ref 135–145)

## 2016-02-26 LAB — CBC
HEMATOCRIT: 31.7 % — AB (ref 36.0–46.0)
HEMOGLOBIN: 10 g/dL — AB (ref 12.0–15.0)
MCH: 26 pg (ref 26.0–34.0)
MCHC: 31.5 g/dL (ref 30.0–36.0)
MCV: 82.3 fL (ref 78.0–100.0)
Platelets: 242 10*3/uL (ref 150–400)
RBC: 3.85 MIL/uL — ABNORMAL LOW (ref 3.87–5.11)
RDW: 17.1 % — ABNORMAL HIGH (ref 11.5–15.5)
WBC: 17 10*3/uL — ABNORMAL HIGH (ref 4.0–10.5)

## 2016-02-26 MED ORDER — CIPROFLOXACIN IN D5W 400 MG/200ML IV SOLN
400.0000 mg | Freq: Two times a day (BID) | INTRAVENOUS | Status: DC
  Administered 2016-02-26 – 2016-03-01 (×10): 400 mg via INTRAVENOUS
  Filled 2016-02-26 (×12): qty 200

## 2016-02-26 MED ORDER — VANCOMYCIN HCL IN DEXTROSE 1-5 GM/200ML-% IV SOLN: 1000.0000 mg | Freq: Three times a day (TID) | INTRAVENOUS | Status: DC

## 2016-02-26 MED ORDER — POTASSIUM CHLORIDE 20 MEQ/15ML (10%) PO SOLN
30.0000 meq | ORAL | Status: AC
  Administered 2016-02-26 (×2): 30 meq
  Filled 2016-02-26 (×2): qty 30

## 2016-02-26 NOTE — Progress Notes (Signed)
PULMONARY / CRITICAL CARE MEDICINE   Name: [name] [name] MRN: [id number] DOB: 05/14/1970    ADMISSION DATE:  02/22/2016 CONSULTATION DATE:  02/22/16  REFERRING MD:  [name] HireKristen Ward MD  CHIEF COMPLAINT:  Acute respiratory failure, Multilobar PNA  HISTORY OF PRESENT ILLNESS:   [name] [name] is a 45 year old NHR with past medical history of frontotemporal dementia, restless leg syndrome. Marland Kitchen. She was brought to the ED with altered mental status and respiratory distress. She had sats in the 40s and was intubated and PCCM called for admission. Chest x-ray shows right upper lobe and right middle lobe consolidation/collapse. In the ED she has a temp of 101.2, she has been given 3 L NS and started on normal saline at 125/hour. She has received a dose of Vanco, Zosyn. She had a large bowel movement and has been placed on enteric precautions as well. ABG showed respiratory acidosis and vent rate was increased to 20. She also has leukocytosis with elevated lactic acid.  Regarding her neurologic issues. 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 and has been evaluated by [name] in 2015. This was felt to be more consistent with frontotemporal dementia. Given the progressive nature of this disease palliation was advised. 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 The case was discussed with [name] during her last admission in May 2017. Outpatient palliative care follow-up was recommended. However she remains full code.   SUBJECTIVE:  Critically ill intubated Low gr Febrile     VITAL SIGNS: BP 117/87   Pulse 96   Temp 99.5 F (37.5 C) (Oral)   Resp 16   Ht 5\' 5"  (1.651 m)   Wt 142 lb 6.7 oz (64.6 kg)   SpO2 100%   BMI 23.70 kg/m   HEMODYNAMICS:    VENTILATOR SETTINGS: Vent Mode: PRVC FiO2 (%):  [40 %] 40 % Set Rate:  [16 bmp] 16 bmp Vt Set:  [400 mL] 400 mL PEEP:  [5 cmH20] 5 cmH20 Plateau Pressure:  [17 cmH20-22 cmH20] 22 cmH20  INTAKE /  OUTPUT: I/O last 3 completed shifts: In: 3508.5 [Other:425; NG/GT:2046; IV Piggyback:1037.5] Out: 2450 [Urine:2450]  PHYSICAL EXAMINATION: General:  Chr ill , intubated Neuro: eyes open, does not follow commands , no obvious pain HEENT:  No thyromegaly, JVD Cardiovascular:  RRR, no MRG Lungs:  Diffuse rhonchi on right. No wheeze Abdomen:  Distended, + BS Musculoskeletal:  Contracted arms BL Skin:  Intact  LABS:  BMET  Recent Labs Lab 02/24/16 0330 02/25/16 0430 02/26/16 0440  NA 143 144 146*  K 2.9* 3.5 3.2*  CL 108 112* 111  CO2 27 26 29   BUN 8 12 20   CREATININE 0.46 0.56 1.46*  GLUCOSE 117* 110* 161*    Electrolytes  Recent Labs Lab 02/23/16 0409 02/23/16 1727  02/24/16 0330 02/25/16 0430 02/26/16 0440  CALCIUM 8.0*  --   < > 8.1* 8.6* 8.7*  MG 1.5* 2.1  --   --  1.8  --   PHOS 1.2* 3.6  --   --  3.4  --   < > = values in this interval not displayed.  CBC  Recent Labs Lab 02/24/16 0330 02/25/16 0430 02/26/16 0440  WBC 17.9* 16.9* 17.0*  HGB 10.0* 9.3* 10.0*  HCT 31.3* 29.7* 31.7*  PLT 262 251 242    Coag's No results for input(s): APTT, INR in the last 168 hours.  Sepsis Markers  Recent Labs Lab 02/22/16 0602 02/22/16 0844 02/22/16  1539  LATICACIDVEN 1.93* 4.0* 2.9*    ABG  Recent Labs Lab 02/22/16 0639 02/23/16 0325  PHART 7.107* 7.545*  PCO2ART 88.8* 24.2*  PO2ART 103.0 134*    Liver Enzymes  Recent Labs Lab 02/22/16 0622  AST 36  ALT 40  ALKPHOS 120  BILITOT 0.5  ALBUMIN 3.5    Cardiac Enzymes  Recent Labs Lab 02/22/16 0840 02/22/16 1539 02/22/16 2139  TROPONINI 0.04* <0.03 <0.03    Glucose  Recent Labs Lab 02/25/16 1226 02/25/16 1625 02/25/16 2019 02/26/16 0022 02/26/16 0409 02/26/16 0741  GLUCAP 110* 120* 135* 123* 130* 116*    Imaging Dg Chest Port 1 View  Result Date: 02/26/2016 CLINICAL DATA:  Acute respiratory failure. EXAM: PORTABLE CHEST 1 VIEW COMPARISON:  02/25/2016 FINDINGS:  Endotracheal tube has tip 3.6 cm above the carina. Left IJ central venous catheters unchanged with tip over the right atrium. Lungs are hypoinflated with linear atelectasis over the right midlung. Mild patchy opacification over the medial right upper lobe. Mild stable cardiomegaly. Remainder of the exam is unchanged. IMPRESSION: Mild opacification over the medial right upper lobe as cannot exclude residual infection. Minimal linear atelectasis right midlung. Tubes and lines as described. Electronically Signed   By: Elberta Fortisaniel  Boyle M.D.   On: 02/26/2016 07:28   STUDIES:  CT chest 12/21 > Complete right upper lobe consolidation and significant volume loss with occlusion of the right upper lobe bronchus by low-attenuation material, favoring mucous plugging. No discrete obstructing mass. 3. Moderate patchy consolidation and ground-glass attenuation throughout the right middle lobe, lingula and bilateral lower lobes, favoring multilobar pneumonia and/or aspiration 12/21 bscopy >> mucus plugs Rt  CULTURES: Bcx 12/21 >> coag neg staph 1/2 Sputum Cx 12/21 > pseudomonas panS, MRSA Stool norovirus + Stool c diff + RVP + Urine strep ag neg Urine legionella neg  ANTIBIOTICS: Vanco 12/21 >12/24 Zosyn 12/21 > Azithro 12/21 >12/23  SIGNIFICANT EVENTS: 12/21 > Admit with resp failure.   LINES/TUBES:  DISCUSSION: 45 year old with progressive frontotemporal dementia. Admitted with acute respiratory failure, multilobar  GNR pneumonia/ RUL atelectasis & diarrhea due to c diff / norovirus Waiting on family to decide - trach vs comfort care  ASSESSMENT / PLAN:  PULMONARY A: Acute hypercapnic, hypoxic respiratory failure Multilobar pneumonia/ RUL atx P:   Continue vent support, Attempt SBts but doubt extubatable Ongoing palliative discussion but sister POA leaning towards tstomy -not sure this is the best for her  CARDIOVASCULAR A:  Sinus Tachycardia. Likely from sepsis P:  Tele   RENAL A:    Hypokalemia/ hypophos/ hypomag P:   Monitor urine output and Cr Replete lytes as needed  GASTROINTESTINAL A:   Diarrhea- C diff & norovirus P:   Enteric precautions ct TFs  HEMATOLOGIC A:   Leukocytosis from sepsis P:  Follow CBC  INFECTIOUS A:   Multilobar PNA -pseudomonas C diff colitis P:   Continue PO vanco,dc zosyn. dc azithro  Cipro per pharmacy  dc  IV vanc   ENDOCRINE A:   Elevated BS P:   SSI coverage  NEUROLOGIC A:   Frontotemporal dementia P:   Palliation recommended by [facility] given the progressive nature of the disease.   FAMILY  - Updates: sister 12/23  - needs more discussion - Inter-disciplinary family meet or Palliative Care meeting due by:  12/28, consulted  Critical care time- 31 mins.  Cyril Mourningakesh Kylei Purington MD. Tonny BollmanFCCP. [place] [facility] Pager [phone] If no response call [phone]   02/26/2016

## 2016-02-26 NOTE — Progress Notes (Signed)
[facility]OC[facility] FOR AM LAB REPLACEMENT ONLY  The patient does [facility] Protocol based on the criteria listed below:   1. Is GFR >/= 40 ml/min? Yes.    Patient's GFR today is 49 2. Is urine output >/= 0.5 ml/kg/hr for the last 6 hours? Yes.   Patient's UOP is 0.6 ml/kg/hr 3. Is BUN < 60 mg/dL? Yes.    Patient's BUN today is 20 4. Abnormal electrolyte(s): K+3.2 5. Ordered repletion with: Protocol 6. If a panic level lab has been reported, has the CCM MD in charge been notified? No..   Physician:  Adrian BlackwaterRamaswamy  Sundeep Cary Hilliard 02/26/2016 5:41 AM

## 2016-02-26 NOTE — Progress Notes (Addendum)
Pharmacy Antibiotic Note  [name] SchmidtChevella [name] is a 45 y.o. female admitted on 02/22/2016 with  possible pneumonia (also with Cdiff and Norovirus). Pharmacy consulted to de-escalate antibiotics and start ciprofloxacin. Afeb, WBC 17.   Plan: Initiate ciprofloxacin IV 400 mg q12h Continue po vanc Monitor vancomycin trough at Css. Monitor renal function, clinical status, and culture results.   Height: 5\' 5"  (165.1 cm) Weight: 142 lb 6.7 oz (64.6 kg) IBW/kg (Calculated) : 57  Temp (24hrs), Avg:99.7 F (37.6 C), Min:98.4 F (36.9 C), Max:100.8 F (38.2 C)   Recent Labs Lab 02/22/16 0602 02/22/16 0622 02/22/16 0844 02/22/16 1539 02/23/16 0409 02/23/16 1728 02/24/16 0330 02/24/16 0930 02/25/16 0430 02/26/16 0440  WBC  --  15.6*  --   --  15.6*  --  17.9*  --  16.9* 17.0*  CREATININE  --  0.71  --   --  0.52 0.54 0.46  --  0.56 1.46*  LATICACIDVEN 1.93*  --  4.0* 2.9*  --   --   --   --   --   --   VANCOTROUGH  --   --   --   --   --   --   --  4*  --   --     Estimated Creatinine Clearance: 43.3 mL/min (by C-G formula based on SCr of 1.46 mg/dL (H)).    Allergies  Allergen Reactions  . Sulfa Antibiotics Other (See Comments)    Per MAR    Antimicrobials this admission: Vancomycin 12/21 >> 12/24 Zosyn 12/21 >> 12/24 Azithromycin 12/21 >> 12/23 PO Vanc 12/21 >> Cipro 12/25 >>  Dose adjustments this admission: 12/23 VT 4 (auto-diuresed) - Reload and increase MD  Microbiology results: 12/21 BCx: 1/2 GPC clusters (BCID- CONS) 12/21 UCx: multiple species, none predominant 12/21 BAL: >100K Pseudomonas (sensitive to Zosyn); 20K 20k MRSA 12/21 C. Diff Ag positive, toxin negative, PCR positive 12/21: Legionella UAg: ip 12/21: Strep UAg: negative 12/21: RVP: negative 12/21 GI Panel: + Norovirus 12/21: MRSA PCR negative 12/21: Influenza PCR negative  Thank you for allowing pharmacy to be a part of this patient's care.  Sandi CarneNick Balraj Brayfield, PharmD, BCPS Pharmacy Resident Pager:  [phone] 02/26/2016

## 2016-02-27 ENCOUNTER — Inpatient Hospital Stay (HOSPITAL_COMMUNITY): Payer: Medicare Other

## 2016-02-27 LAB — GLUCOSE, CAPILLARY
GLUCOSE-CAPILLARY: 117 mg/dL — AB (ref 65–99)
GLUCOSE-CAPILLARY: 134 mg/dL — AB (ref 65–99)
Glucose-Capillary: 118 mg/dL — ABNORMAL HIGH (ref 65–99)
Glucose-Capillary: 121 mg/dL — ABNORMAL HIGH (ref 65–99)
Glucose-Capillary: 137 mg/dL — ABNORMAL HIGH (ref 65–99)

## 2016-02-27 LAB — BASIC METABOLIC PANEL
ANION GAP: 10 (ref 5–15)
BUN: 24 mg/dL — AB (ref 6–20)
CALCIUM: 9.2 mg/dL (ref 8.9–10.3)
CO2: 23 mmol/L (ref 22–32)
CREATININE: 1.51 mg/dL — AB (ref 0.44–1.00)
Chloride: 115 mmol/L — ABNORMAL HIGH (ref 101–111)
GFR calc Af Amer: 47 mL/min — ABNORMAL LOW (ref >60)
GFR, EST NON AFRICAN AMERICAN: 40 mL/min — AB (ref >60)
GLUCOSE: 109 mg/dL — AB (ref 65–99)
Potassium: 4 mmol/L (ref 3.5–5.1)
Sodium: 148 mmol/L — ABNORMAL HIGH (ref 135–145)

## 2016-02-27 LAB — CBC
HCT: 33 % — ABNORMAL LOW (ref 36.0–46.0)
Hemoglobin: 10.4 g/dL — ABNORMAL LOW (ref 12.0–15.0)
MCH: 26.3 pg (ref 26.0–34.0)
MCHC: 31.5 g/dL (ref 30.0–36.0)
MCV: 83.5 fL (ref 78.0–100.0)
PLATELETS: 287 10*3/uL (ref 150–400)
RBC: 3.95 MIL/uL (ref 3.87–5.11)
RDW: 17.5 % — ABNORMAL HIGH (ref 11.5–15.5)
WBC: 15.4 10*3/uL — AB (ref 4.0–10.5)

## 2016-02-27 LAB — CULTURE, BLOOD (ROUTINE X 2): Culture: NO GROWTH

## 2016-02-27 MED ORDER — FREE WATER
200.0000 mL | Freq: Four times a day (QID) | Status: DC
  Administered 2016-02-27 – 2016-03-24 (×104): 200 mL

## 2016-02-27 NOTE — Care Management Note (Signed)
Case Management Note  Patient Details  Name: [name] SchmidtChevella [name] MRN: [id number] Date of Birth: 08/24/1970  Subjective/Objective: Pt admitted on 02/22/16 with acute respiratory failure, multilobar PNA.  PTA, [facility].  Pt's POA is her sister, Kathryne ErikssonJanet Castelli.                     Action/Plan: Pt remains on ventilator, and is not weaning.  Per MD, pt will not be able to wean from ventilator, and sister is considering tracheostomy.  Palliative Medicine Team to meet with sister on 02/28/16 at 2pm for Goals of Care meeting.  Will consult CSW to facilitate return to SNF/possible vent SNF, should sister decide that she wants trach.    Expected Discharge Date:                  Expected Discharge Plan:  Skilled Nursing Facility  In-House Referral:  Clinical Social Work  Discharge planning Services  CM Consult  Post Acute Care Choice:    Choice offered to:     DME Arranged:    DME Agency:     HH Arranged:    HH Agency:     Status of Service:  In process, will continue to follow  If discussed at Long Length of Stay Meetings, dates discussed:    Additional Comments:  Glennon Macmerson, Anokhi Shannon M, RN 02/27/2016, 2:26 PM

## 2016-02-27 NOTE — Progress Notes (Signed)
PULMONARY / CRITICAL CARE MEDICINE   Name: [name] SchmidtChevella [name] MRN: [id number] DOB: 11/13/1970    ADMISSION DATE:  02/22/2016 CONSULTATION DATE:  02/22/16  REFERRING MD:  Baxter HireKristen Ward MD  CHIEF COMPLAINT:  Acute respiratory failure, Multilobar PNA  HISTORY OF PRESENT ILLNESS:   [name] [name] is a 45 year old NHR with past medical history of frontotemporal dementia, restless leg syndrome. Marland Kitchen. She was brought to the ED with altered mental status and respiratory distress. She had sats in the 40s and was intubated and PCCM called for admission. Chest x-ray shows right upper lobe and right middle lobe consolidation/collapse. In the ED she has a temp of 101.2, she has been given 3 L NS and started on normal saline at 125/hour. She has received a dose of Vanco, Zosyn. She had a large bowel movement and has been placed on enteric precautions as well. ABG showed respiratory acidosis and vent rate was increased to 20. She also has leukocytosis with elevated lactic acid.  Regarding her neurologic issues. She has a diagnosis of Crutzfeld [name] disease in the chart. and [facility]. This was felt to be more consistent with frontotemporal dementia. Given the progressive nature of this disease palliation was advised. 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 The case was [facility] with [facility] during her last admission in May 2017. Outpatient palliative care follow-up was recommended. However she remains full code.   SUBJECTIVE:  Critically ill intubated Febrile  Poorly responsive    VITAL SIGNS: BP 125/84   Pulse 98   Temp (!) 101.1 F (38.4 C)   Resp 16   Ht 5\' 5"  (1.651 m)   Wt 141 lb 8.6 oz (64.2 kg)   SpO2 100%   BMI 23.55 kg/m   HEMODYNAMICS:    VENTILATOR SETTINGS: Vent Mode: PRVC FiO2 (%):  [40 %] 40 % Set Rate:  [16 bmp] 16 bmp Vt Set:  [400 mL] 400 mL PEEP:  [5 cmH20] 5 cmH20 Plateau Pressure:  [17 cmH20-27 cmH20] 17  cmH20  INTAKE / OUTPUT: I/O last 3 completed shifts: In: 3130 [I.V.:150; Other:355; WU/JW:1191; NG/GT:1925; IV Piggyback:700] Out: 2660 [Urine:2660]  PHYSICAL EXAMINATION: General:  Chr ill , intubated Neuro: eyes open, does not follow commands , no obvious pain HEENT:  No thyromegaly, JVD Cardiovascular:  RRR, no MRG Lungs:  Bl scattered crackles. No wheeze Abdomen:  Distended, + BS Musculoskeletal:  Contracted arms BL Skin:  Intact  LABS:  BMET  Recent Labs Lab 02/25/16 0430 02/26/16 0440 02/27/16 0214  NA 144 146* 148*  K 3.5 3.2* 4.0  CL 112* 111 115*  CO2 26 29 23   BUN 12 20 24*  CREATININE 0.56 1.46* 1.51*  GLUCOSE 110* 161* 109*    Electrolytes  Recent Labs Lab 02/23/16 0409 02/23/16 1727  02/25/16 0430 02/26/16 0440 02/27/16 0214  CALCIUM 8.0*  --   < > 8.6* 8.7* 9.2  MG 1.5* 2.1  --  1.8  --   --   PHOS 1.2* 3.6  --  3.4  --   --   < > = values in this interval not displayed.  CBC  Recent Labs Lab 02/25/16 0430 02/26/16 0440 02/27/16 0214  WBC 16.9* 17.0* 15.4*  HGB 9.3* 10.0* 10.4*  HCT 29.7* 31.7* 33.0*  PLT 251 242 287    Coag's No results for input(s): APTT, INR in the last 168 hours.  Sepsis Markers  Recent Labs Lab 02/22/16 0602 02/22/16 0844 02/22/16  1539  LATICACIDVEN 1.93* 4.0* 2.9*    ABG  Recent Labs Lab 02/22/16 0639 02/23/16 0325  PHART 7.107* 7.545*  PCO2ART 88.8* 24.2*  PO2ART 103.0 134*    Liver Enzymes  Recent Labs Lab 02/22/16 0622  AST 36  ALT 40  ALKPHOS 120  BILITOT 0.5  ALBUMIN 3.5    Cardiac Enzymes  Recent Labs Lab 02/22/16 0840 02/22/16 1539 02/22/16 2139  TROPONINI 0.04* <0.03 <0.03    Glucose  Recent Labs Lab 02/26/16 0741 02/26/16 1128 02/26/16 1603 02/26/16 2000 02/27/16 0003 02/27/16 0358  GLUCAP 116* 124* 121* 115* 137* 117*    Imaging Dg Chest Port 1 View  Result Date: 02/27/2016 CLINICAL DATA:  Respiratory failure. EXAM: PORTABLE CHEST 1 VIEW COMPARISON:   02/26/2016. FINDINGS: Endotracheal tube, left IJ line in stable position. Heart size normal. Low lung volumes with basilar atelectasis. Developing left base infiltrate. Tiny pleural effusions cannot be excluded. No pneumothorax . IMPRESSION: 1. Lines and tubes in stable position. 2. Low lung volumes with basilar atelectasis. Developing left lower lobe infiltrate. Small bilateral pleural effusions cannot be excluded. Electronically Signed   By: Maisie Fushomas  Register   On: 02/27/2016 07:13   STUDIES:  CT chest 12/21 > Complete right upper lobe consolidation and significant volume loss with occlusion of the right upper lobe bronchus by low-attenuation material, favoring mucous plugging. No discrete obstructing mass. 3. Moderate patchy consolidation and ground-glass attenuation throughout the right middle lobe, lingula and bilateral lower lobes, favoring multilobar pneumonia and/or aspiration 12/21 bscopy >> mucus plugs Rt  CULTURES: Bcx 12/21 >> coag neg staph 1/2 Sputum Cx 12/21 > pseudomonas panS, MRSA Stool norovirus + Stool c diff + RVP + Urine strep ag neg Urine legionella neg  ANTIBIOTICS: Vanco 12/21 >12/24 Zosyn 12/21 > Azithro 12/21 >12/23  SIGNIFICANT EVENTS: 12/21 > Admit with resp failure.   LINES/TUBES:  DISCUSSION: 45 year old with progressive frontotemporal dementia. Admitted with acute respiratory failure, multilobar  GNR pneumonia/ RUL atelectasis & diarrhea due to c diff / norovirus Waiting on family to decide - trach vs comfort care  ASSESSMENT / PLAN:  PULMONARY A: Acute hypercapnic, hypoxic respiratory failure Multilobar pneumonia/ RUL atx P:   Continue vent support, Attempt SBTs but doubt extubatable Ongoing palliative discussion but sister POA leaning towards tstomy -not sure this is the best for her  CARDIOVASCULAR A:  Sinus Tachycardia. Likely from sepsis P:  Tele   RENAL A:   Hypokalemia/ hypophos/ hypomag hypernatremia P:   Monitor urine output  and Cr Replete lytes as needed Add free water  GASTROINTESTINAL A:   Diarrhea- C diff & norovirus P:   Enteric precautions ct TFs  HEMATOLOGIC A:   Anemia , chronic illness P:  Follow CBC  INFECTIOUS A:   Multilobar PNA -pseudomonas C diff colitis Leucocytosis, fever P:   Continue PO vanco,dc zosyn. dc azithro  Cipro per pharmacy  dc  IV vanc   ENDOCRINE A:   Elevated BS P:   SSI coverage  NEUROLOGIC A:   Frontotemporal dementia P:   Palliation recommended by [facility] given the progressive nature of the disease.   FAMILY  - Updates: sister 12/23  - needs more discussion - Inter-disciplinary family meet or Palliative Care meeting due by:  12/28, consulted  Critical care time- 31 mins.  Cyril Mourningakesh Alva MD. Tonny BollmanFCCP. [place] [facility] Pager [phone] If no response call [phone]   02/27/2016

## 2016-02-27 NOTE — Progress Notes (Signed)
Spoke with patient's daughter, [name]. Meeting planned for tomorrow 12/27 at 2pm.  Ocie BobKasie Gaylon Melchor, AGNP-C Palliative Medicine  Please call Palliative Medicine team phone with any questions [phone]. For individual providers please see AMION.

## 2016-02-27 NOTE — Progress Notes (Signed)
Palliative consult received-  Call placed to patient's sister, [name]. Requested return call to arrange family meeting.   Ocie BobKasie Iliza Blankenbeckler, AGNP-C Palliative Medicine  Please call Palliative Medicine team phone with any questions [phone]. For individual providers please see AMION.

## 2016-02-28 ENCOUNTER — Inpatient Hospital Stay (HOSPITAL_COMMUNITY): Payer: Medicare Other

## 2016-02-28 DIAGNOSIS — Z515 Encounter for palliative care: Secondary | ICD-10-CM

## 2016-02-28 DIAGNOSIS — Z7189 Other specified counseling: Secondary | ICD-10-CM

## 2016-02-28 LAB — GLUCOSE, CAPILLARY
GLUCOSE-CAPILLARY: 104 mg/dL — AB (ref 65–99)
GLUCOSE-CAPILLARY: 106 mg/dL — AB (ref 65–99)
GLUCOSE-CAPILLARY: 120 mg/dL — AB (ref 65–99)
GLUCOSE-CAPILLARY: 126 mg/dL — AB (ref 65–99)
GLUCOSE-CAPILLARY: 130 mg/dL — AB (ref 65–99)
Glucose-Capillary: 130 mg/dL — ABNORMAL HIGH (ref 65–99)
Glucose-Capillary: 104 mg/dL — ABNORMAL HIGH (ref 65–99)
Glucose-Capillary: 112 mg/dL — ABNORMAL HIGH (ref 65–99)

## 2016-02-28 LAB — CBC
HEMATOCRIT: 29.6 % — AB (ref 36.0–46.0)
HEMOGLOBIN: 9.2 g/dL — AB (ref 12.0–15.0)
MCH: 25.8 pg — ABNORMAL LOW (ref 26.0–34.0)
MCHC: 31.1 g/dL (ref 30.0–36.0)
MCV: 83.1 fL (ref 78.0–100.0)
Platelets: 276 10*3/uL (ref 150–400)
RBC: 3.56 MIL/uL — ABNORMAL LOW (ref 3.87–5.11)
RDW: 17.4 % — ABNORMAL HIGH (ref 11.5–15.5)
WBC: 16.4 10*3/uL — AB (ref 4.0–10.5)

## 2016-02-28 LAB — BASIC METABOLIC PANEL
ANION GAP: 11 (ref 5–15)
BUN: 25 mg/dL — ABNORMAL HIGH (ref 6–20)
CHLORIDE: 112 mmol/L — AB (ref 101–111)
CO2: 24 mmol/L (ref 22–32)
Calcium: 9 mg/dL (ref 8.9–10.3)
Creatinine, Ser: 1.35 mg/dL — ABNORMAL HIGH (ref 0.44–1.00)
GFR calc non Af Amer: 46 mL/min — ABNORMAL LOW (ref >60)
GFR, EST AFRICAN AMERICAN: 54 mL/min — AB (ref >60)
Glucose, Bld: 102 mg/dL — ABNORMAL HIGH (ref 65–99)
Potassium: 3.2 mmol/L — ABNORMAL LOW (ref 3.5–5.1)
Sodium: 147 mmol/L — ABNORMAL HIGH (ref 135–145)

## 2016-02-28 LAB — PHOSPHORUS: PHOSPHORUS: 3.2 mg/dL (ref 2.5–4.6)

## 2016-02-28 LAB — MAGNESIUM: Magnesium: 2.1 mg/dL (ref 1.7–2.4)

## 2016-02-28 MED ORDER — POTASSIUM CHLORIDE 20 MEQ/15ML (10%) PO SOLN
30.0000 meq | ORAL | Status: AC
  Administered 2016-02-28 (×2): 30 meq
  Filled 2016-02-28 (×2): qty 30

## 2016-02-28 NOTE — Progress Notes (Signed)
[facility] FOR AM LAB REPLACEMENT ONLY  The patient does [facility] the [facility] Replacment Protocol based on the criteria listed below:   1. Is GFR >/= 40 ml/min? Yes.    Patient's GFR today is 54 2. Is urine output >/= 0.5 ml/kg/hr for the last 6 hours? Yes.   Patient's UOP is 0.71 ml/kg/hr 3. Is BUN < 60 mg/dL? Yes.    Patient's BUN today is 25 4. Abnormal electrolyte(s):   K - 3.2 5. Ordered repletion with: PER PROTOCOL 6. If a panic level lab has been reported, has the CCM MD in charge been notified? Yes.  .   Physician:  Dr. Elwyn Reachamaswamy  [name] [name] C [name] [name] 02/28/2016 5:03 AM

## 2016-02-28 NOTE — Consult Note (Signed)
Consultation Note Date: 02/28/2016   Patient Name: [name] [name]  DOB: 30-Jun-1970  MRN: [id number]  Age / Sex: 45 y.o., female  PCP: Garwin Brothers, MD Referring Physician: Rigoberto Noel, MD  Reason for Consultation: Establishing goals of care  HPI/Patient Profile: 45 y.o. female  with past medical history of frontotemporal dementia, aphasia, ?CJD, depression, living at [facility] admitted on 02/22/2016 with altered mental status and respiratory distress requiring intubation. Workup reveals pneumonia with sepsis. She has been unable to wean from the ventilator and PCCM is discussing tracheostomy. She has a PEG already in place.  Clinical Assessment and Goals of Care: Met with patient's sister, Marcie Bal and her two other siblings. Marcie Bal states she is legal guardian but did not produce paperwork. Paperwork requested. Patient also has a son who has visited his mother a few times at the nursing facility but has otherwise not been involved in or shown interest in participating in patient's care and decision making.  Discussed patient's overall disease trajectory and current state. Prior to the onset of her dementia patient had graduated with her master's degree and was working as a Education officer, museum. The siblings all describe patient as joyful and full of life, enjoying travel and music. Her illness presented with behavior changes and she lost her job and became very depressed. She also suffered some traumatic events at this time and Marcie Bal has questioned if this trauma precipitated her decline. Marcie Bal tells me patient was living in a nursing facility, doesn't eat, doesn't talk, doesn't walk, is completely dependent for ADL's. Review of chart reveals that patient was thought to have CJD, but [facility] neuro indicates possibility of frontotemporal dementia.  Discussed with patient's siblings that patient has nonreversible dementia  process. Noted continued decline and possibility of need for trach. Patient will have to be transferred out of state if trach is placed and she is vent dependent. Presented option of changing focus of care to comfort vs life prolonging measures. Patient's brother and sister, Mardene Celeste were more open to this option. Marcie Bal was firm in her desire to continue all life prolonging measures even if this does not improve patient's quality of life. She admits this is "selfish, but I'm not ready to let go". She has strong religiosity and is firm in her belief that she must continue all measures and "God will take her when its time no matter what we do". Zenovia Jordan emotional support and encouraged Marcie Bal to consider reframing her outlook to the possibility that patient is trying to die and medical interventions are prolonging death and suffering.  Marcie Bal and siblings agreed to discuss and followup with conference call tomorrow.  Sister, Mardene Celeste did ask about spontaneous recovery possibility similar to as story she had heard about "someone in a vegetative state that just woke up one day". I offered that while I wished that were a possibility for this patient, that due to the etiology of her current state that is not possible. I again described dementia, and the progressive and terminal nature and patterns  of trajectory. They are understandably overwhelmed with this information and had not been confronted with the option of comfort care before. My feeling is that tracheostomy and vent SNF are not in this patient's best interest and would provide no improvement of quality of life. Followup planned for tomorrow at 3pm.     Primary Decision Knierim - sister Marcie Bal- needs to be confirmed with paper work    SUMMARY OF RECOMMENDATIONS  -Need to obtain guardianship paperwork from sister -Continue current level of care -Sister desires full scope care including trach but agrees to consider Faith and quality of life and  agrees to followup conference planned for tomorrow at 3pm via telephone     Code Corazon:  Full code    Palliative Prophylaxis:   Frequent Pain Assessment  Additional Recommendations (Limitations, Scope, Preferences):  Full Scope Treatment  Psycho-social/Spiritual:   Desire for further Chaplaincy support:: No  Additional Recommendations: Education on Hospice  Prognosis:    Unable to determine  Discharge Planning: To Be Determined  Primary Diagnoses: Present on Admission: . PNA (pneumonia)   I have reviewed the medical record, interviewed the patient and family, and examined the patient. The following aspects are pertinent.  Past Medical History:  Diagnosis Date  . Aphagia   . Bacteremia   . Constipation   . Ivin Booty disease   . Dementia   . Depression   . Esophageal reflux   . Pneumonia   . Restless leg syndrome   . Sepsis ([place])   . Staphylococcus hominis sepsis ([place]) 01/24/2015  . UTI (urinary tract infection)   . UTI (urinary tract infection) due to Enterococcus 01/24/2015  . Vitamin B12 deficiency    Social History   Social History  . Marital status: Single    Spouse name: N/A  . Number of children: N/A  . Years of education: N/A   Social History Main Topics  . Smoking status: Never Smoker  . Smokeless tobacco: None  . Alcohol use No  . Drug use: No  . Sexual activity: Not Asked   Other Topics Concern  . None   Social History Narrative  . None   Family History  Problem Relation Age of Onset  . Family history unknown: Yes   Scheduled Meds: . chlorhexidine gluconate (MEDLINE KIT)  15 mL Mouth Rinse BID  . ciprofloxacin  400 mg Intravenous Q12H  . enoxaparin (LOVENOX) injection  40 mg Subcutaneous Q24H  . famotidine  20 mg Per Tube BID  . free water  200 mL Per Tube Q6H  . insulin aspart  2-6 Units Subcutaneous Q4H  . mouth rinse  15 mL Mouth Rinse QID  . vancomycin  125 mg Oral QID   Continuous  Infusions: . feeding supplement (VITAL AF 1.2 CAL) 1,000 mL (02/27/16 2010)   PRN Meds:.sodium chloride, acetaminophen, albuterol, fentaNYL (SUBLIMAZE) injection, ondansetron (ZOFRAN) IV Medications Prior to Admission:  Prior to Admission medications   Medication Sig Start Date End Date Taking? Authorizing Provider  acetaminophen (TYLENOL) 325 MG tablet Take 650 mg by mouth every 4 (four) hours as needed for mild pain or fever.   Yes Historical Provider, MD  aspirin EC 81 MG tablet Take 81 mg by mouth 2 (two) times daily.   Yes Historical Provider, MD  baclofen (LIORESAL) 10 MG tablet 10 mg by PEG Tube route 3 (three) times daily. 9am, 2pm, 9pm   Yes Historical Provider, MD  calcitonin, salmon, (MIACALCIN/FORTICAL) 200 UNIT/ACT nasal spray Place  1 spray into alternate nostrils daily.   Yes Historical Provider, MD  cetirizine (ZYRTEC) 10 MG tablet 10 mg by PEG Tube route daily.    Yes Historical Provider, MD  Cholecalciferol (VITAMIN D-3) 5000 UNITS TABS 5,000 Units by PEG Tube route See admin instructions. On Monday and Saturday morning and evening   Yes Historical Provider, MD  esomeprazole (NEXIUM) 40 MG packet 40 mg by PEG Tube route daily.   Yes Historical Provider, MD  guaiFENesin (ROBITUSSIN) 100 MG/5ML SOLN 400 mg by PEG Tube route every 6 (six) hours as needed (congestion).    Yes Historical Provider, MD  magnesium hydroxide (MILK OF MAGNESIA) 400 MG/5ML suspension Place 15 mLs into feeding tube every other day.   Yes Historical Provider, MD  meloxicam (MOBIC) 7.5 MG tablet 7.5 mg by PEG Tube route daily.    Yes Historical Provider, MD  morphine (ROXANOL) 20 MG/ML concentrated solution Place 0.25 mLs (5 mg total) into feeding tube every 2 (two) hours as needed (pain/ shortness of breath/ air hunger). 07/30/15  Yes Milagros Loll, MD  Multiple Vitamin (MULTIVITAMIN) LIQD 5 mLs by PEG Tube route daily.   Yes Historical Provider, MD  oxyCODONE-acetaminophen (PERCOCET/ROXICET) 5-325 MG tablet  Place 1 tablet into feeding tube 2 (two) times daily.   Yes Historical Provider, MD  polyethylene glycol (MIRALAX / GLYCOLAX) packet 17 g by PEG Tube route daily.    Yes Historical Provider, MD  PRESCRIPTION MEDICATION by PEG Tube route continuous. Enteral Feed order Two Cal HN 30 ml/hr   Yes Historical Provider, MD  rOPINIRole (REQUIP) 0.25 MG tablet 0.25 mg by PEG Tube route daily at 6 PM.    Yes Historical Provider, MD  Saccharomyces boulardii (FLORASTOR PO) 1 capsule by PEG Tube route daily.   Yes Historical Provider, MD  vitamin B-12 (CYANOCOBALAMIN) 1000 MCG tablet 1,000 mcg by PEG Tube route daily.   Yes Historical Provider, MD  Water For Irrigation, Sterile (FREE WATER) SOLN 175 mLs by PEG Tube route every 4 (four) hours.    Yes Historical Provider, MD  albuterol (PROVENTIL) (2.5 MG/3ML) 0.083% nebulizer solution Take 3 mLs (2.5 mg total) by nebulization 4 (four) times daily. Patient not taking: Reported on 02/22/2016 10/20/13   Delfina Redwood, MD  bisacodyl (DULCOLAX) 10 MG suppository Place 10 mg rectally every other day as needed for moderate constipation.    Historical Provider, MD  ipratropium-albuterol (DUONEB) 0.5-2.5 (3) MG/3ML SOLN Take 3 mLs by nebulization 4 (four) times daily - after meals and at bedtime. Last dose will be a.m. Of 07/29/15    Historical Provider, MD  sucralfate (CARAFATE) 1 GM/10ML suspension 1 g by PEG Tube route 4 (four) times daily. 9am, 2pm, 4pm, 8pm    Historical Provider, MD   Allergies  Allergen Reactions  . Sulfa Antibiotics Other (See Comments)    Per MAR   Review of Systems  Unable to perform ROS: Patient nonverbal    Physical Exam  HENT:  Head: Normocephalic and atraumatic.  Cardiovascular: Intact distal pulses.   tachycardic  Pulmonary/Chest: She has rales.  Intubated with full vent support  Abdominal: Soft. Bowel sounds are normal.  Musculoskeletal:  Bilateral upper extremities contracted  Neurological:  Nonverbal, does not track    Skin: Skin is warm. She is diaphoretic.    Vital Signs: BP (!) 128/93   Pulse (!) 105   Temp 100 F (37.8 C)   Resp 15   Ht '5\' 5"'  (1.651 m)   Wt 64  kg (141 lb 3.2 oz)   SpO2 95%   BMI 23.50 kg/m  Pain Assessment: CPOT       SpO2: SpO2: 95 % O2 Device:SpO2: 95 % O2 Flow Rate: .   IO: Intake/output summary:  Intake/Output Summary (Last 24 hours) at 02/28/16 1339 Last data filed at 02/28/16 1300  Gross per 24 hour  Intake             2745 ml  Output             1780 ml  Net              965 ml    LBM: Last BM Date: 02/28/16 Baseline Weight: Weight: 65.8 kg (145 lb) Most recent weight: Weight: 64 kg (141 lb 3.2 oz)     Palliative Assessment/Data: PPS: 10%     Thank you for this consult. Palliative medicine will continue to follow and assist as needed.   Time In: 1400 Time Out: 1515 Time Total: 75 minutes Greater than 50%  of this time was spent counseling and coordinating care related to the above assessment and plan.  Signed by: Mariana Kaufman, AGNP-C Palliative Medicine    Please contact Palliative Medicine Team phone at [phone] for questions and concerns.  For individual provider: See Shea Evans

## 2016-02-28 NOTE — Progress Notes (Signed)
PULMONARY / CRITICAL CARE MEDICINE   Name: [name] [name] MRN: [id number] DOB: 12/23/1970    ADMISSION DATE:  02/22/2016 CONSULTATION DATE:  02/22/16  REFERRING MD:  [name] Hire Ward MD  CHIEF COMPLAINT:  Acute respiratory failure, Multilobar PNA  HISTORY OF PRESENT ILLNESS:   [name] [name] is a 45 year old NHR with past medical history of frontotemporal dementia, restless leg syndrome. Marland Kitchen She was brought to the ED with altered mental status and respiratory distress. She had sats in the 40s and was intubated and PCCM called for admission. Chest x-ray shows right upper lobe and right middle lobe consolidation/collapse. In the ED she has a temp of 101.2, she has been given 3 L NS and started on normal saline at 125/hour. She has received a dose of Vanco, Zosyn. She had a large bowel movement and has been placed on enteric precautions as well. ABG showed respiratory acidosis and vent rate was increased to 20. She also has leukocytosis with elevated lactic acid.  Regarding her neurologic issues. 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 and has been evaluated by [facility] neurology in 2015. This was felt to be more consistent with frontotemporal dementia. Given the progressive nature of this disease palliation was advised. 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 The case was discussed with palliative care team at [facility] during her last admission in May 2017. Outpatient palliative care follow-up was recommended. However she remains full code.   SUBJECTIVE:  Critically ill intubated Remains Febrile  Poorly responsive    VITAL SIGNS: BP (!) 127/91   Pulse (!) 102   Temp 99.5 F (37.5 C)   Resp 16   Ht 5\' 5"  (1.651 m)   Wt 141 lb 3.2 oz (64 kg)   SpO2 97%   BMI 23.50 kg/m   HEMODYNAMICS:    VENTILATOR SETTINGS: Vent Mode: PRVC FiO2 (%):  [40 %] 40 % Set Rate:  [16 bmp] 16 bmp Vt Set:  [400 mL] 400 mL PEEP:  [5 cmH20] 5 cmH20 Plateau Pressure:  [18 cmH20-27 cmH20] 27  cmH20  INTAKE / OUTPUT: I/O last 3 completed shifts: In: 3335 [I.V.:285; WUJWJ:191; YN/WG:9562; IV Piggyback:400] Out: 2535 [Urine:2535]  PHYSICAL EXAMINATION: General:  Chr ill , intubated Neuro: eyes open, tracks,does not follow commands , no obvious pain HEENT:  No thyromegaly, JVD Cardiovascular:  RRR, no MRG Lungs:  Bl scattered crackles. No wheeze Abdomen:  Distended, + BS Musculoskeletal:  Contracted arms BL Skin:  Intact  LABS:  BMET  Recent Labs Lab 02/26/16 0440 02/27/16 0214 02/28/16 0400  NA 146* 148* 147*  K 3.2* 4.0 3.2*  CL 111 115* 112*  CO2 29 23 24   BUN 20 24* 25*  CREATININE 1.46* 1.51* 1.35*  GLUCOSE 161* 109* 102*    Electrolytes  Recent Labs Lab 02/23/16 1727  02/25/16 0430 02/26/16 0440 02/27/16 0214 02/28/16 0400  CALCIUM  --   < > 8.6* 8.7* 9.2 9.0  MG 2.1  --  1.8  --   --  2.1  PHOS 3.6  --  3.4  --   --  3.2  < > = values in this interval not displayed.  CBC  Recent Labs Lab 02/26/16 0440 02/27/16 0214 02/28/16 0400  WBC 17.0* 15.4* 16.4*  HGB 10.0* 10.4* 9.2*  HCT 31.7* 33.0* 29.6*  PLT 242 287 276    Coag's No results for input(s): APTT, INR in the last 168 hours.  Sepsis Markers  Recent Labs Lab 02/22/16 0602 02/22/16  0844 02/22/16 1539  LATICACIDVEN 1.93* 4.0* 2.9*    ABG  Recent Labs Lab 02/22/16 0639 02/23/16 0325  PHART 7.107* 7.545*  PCO2ART 88.8* 24.2*  PO2ART 103.0 134*    Liver Enzymes  Recent Labs Lab 02/22/16 0622  AST 36  ALT 40  ALKPHOS 120  BILITOT 0.5  ALBUMIN 3.5    Cardiac Enzymes  Recent Labs Lab 02/22/16 0840 02/22/16 1539 02/22/16 2139  TROPONINI 0.04* <0.03 <0.03    Glucose  Recent Labs Lab 02/27/16 0804 02/27/16 1554 02/27/16 2003 02/28/16 0005 02/28/16 0413 02/28/16 0737  GLUCAP 134* 121* 118* 130* 104* 130*    Imaging Dg Chest Port 1 View  Result Date: 02/28/2016 CLINICAL DATA:  Respiratory failure.  Shortness of breath. EXAM: PORTABLE  CHEST 1 VIEW COMPARISON:  02/27/2016 . FINDINGS: Endotracheal tube in stable position. Low lung volumes with mild bibasilar atelectasis. Interim improvement from prior exam. Left base atelectasis/ infiltrate has cleared. Interim near complete resolution of small bilateral pleural effusions. No pneumothorax . IMPRESSION: 1. Lines and tubes in stable position. 2. Low lung volumes with mild bibasilar subsegmental atelectasis. Interim significant improvement from prior exam. Interim near complete resolution of small bilateral pleural effusions . Electronically Signed   By: Maisie Fushomas  Register   On: 02/28/2016 06:54   STUDIES:  CT chest 12/21 > Complete right upper lobe consolidation and significant volume loss with occlusion of the right upper lobe bronchus by low-attenuation material, favoring mucous plugging. No discrete obstructing mass. 3. Moderate patchy consolidation and ground-glass attenuation throughout the right middle lobe, lingula and bilateral lower lobes, favoring multilobar pneumonia and/or aspiration 12/21 bscopy >> mucus plugs Rt  CULTURES: Bcx 12/21 >> coag neg staph 1/2 Sputum Cx 12/21 > pseudomonas panS, MRSA Stool norovirus + Stool c diff + RVP + Urine strep ag neg Urine legionella neg  ANTIBIOTICS: Vanco 12/21 >12/24 Zosyn 12/21 > Azithro 12/21 >12/23  SIGNIFICANT EVENTS: 12/21 > Admit with resp failure.   LINES/TUBES:  DISCUSSION: 45 year old with progressive frontotemporal dementia. Admitted with acute respiratory failure, multilobar  GNR pneumonia/ RUL atelectasis & diarrhea due to c diff / norovirus Waiting on family to decide - trach vs comfort care  ASSESSMENT / PLAN:  PULMONARY A: Acute hypercapnic, hypoxic respiratory failure Multilobar pneumonia/ RUL atx P:   Continue vent support, Attempt SBTs but notextubatable Ongoing palliative discussion -planned for 12/27  but sister POA leaning towards tstomy -not sure this is the best for her  CARDIOVASCULAR A:   Sinus Tachycardia. Likely from sepsis P:  Tele   RENAL A:   Hypokalemia/ hypophos/ hypomag hypernatremia P:   Monitor urine output and Cr Replete lytes as needed Added free water  GASTROINTESTINAL A:   Diarrhea- C diff & norovirus P:   Enteric precautions ct TFs  HEMATOLOGIC A:   Anemia , chronic illness P:  Follow CBC  INFECTIOUS A:   Multilobar PNA -pseudomonas, MRSA 20k only hence not treating C diff colitis Leucocytosis, fever P:   Continue PO vanco,dc zosyn. dc azithro  Cipro per pharmacy If persistent fever, restart   IV vanc for MRSA   ENDOCRINE A:   Elevated BS P:   SSI coverage  NEUROLOGIC A:   Frontotemporal dementia P:   Palliation recommended by [facility] given the progressive nature of the disease.   FAMILY  - Updates: sister 12/23  - needs more discussion - Inter-disciplinary family meet or Palliative Care meeting due by:  Plan for 12/27, consulted  Critical care time- 31 mins.  Cyril Mourningakesh Iantha Titsworth MD. Tonny BollmanFCCP. [facility] Pager [phone] If no response call [phone]   02/28/2016

## 2016-02-28 NOTE — Progress Notes (Signed)
CSW continues to follow for disposition and placement needs. Palliative Medicine Team to meet with sister on today at 2pm for Goals of Care meeting. CSW to facilitate return to SNF/possible vent SNF, should sister decide that she wants trach.     Enos FlingAshley Arcangel Minion, MSW, LCSW [facility] ED/63M Clinical Social Worker [phone]

## 2016-02-29 ENCOUNTER — Inpatient Hospital Stay (HOSPITAL_COMMUNITY): Payer: Medicare Other

## 2016-02-29 DIAGNOSIS — J151 Pneumonia due to Pseudomonas: Principal | ICD-10-CM

## 2016-02-29 LAB — GLUCOSE, CAPILLARY
GLUCOSE-CAPILLARY: 109 mg/dL — AB (ref 65–99)
GLUCOSE-CAPILLARY: 114 mg/dL — AB (ref 65–99)
GLUCOSE-CAPILLARY: 132 mg/dL — AB (ref 65–99)
Glucose-Capillary: 123 mg/dL — ABNORMAL HIGH (ref 65–99)
Glucose-Capillary: 101 mg/dL — ABNORMAL HIGH (ref 65–99)
Glucose-Capillary: 117 mg/dL — ABNORMAL HIGH (ref 65–99)

## 2016-02-29 LAB — BASIC METABOLIC PANEL
ANION GAP: 8 (ref 5–15)
BUN: 27 mg/dL — ABNORMAL HIGH (ref 6–20)
CALCIUM: 9 mg/dL (ref 8.9–10.3)
CO2: 26 mmol/L (ref 22–32)
Chloride: 110 mmol/L (ref 101–111)
Creatinine, Ser: 1.19 mg/dL — ABNORMAL HIGH (ref 0.44–1.00)
GFR, EST NON AFRICAN AMERICAN: 54 mL/min — AB (ref >60)
GLUCOSE: 111 mg/dL — AB (ref 65–99)
POTASSIUM: 4 mmol/L (ref 3.5–5.1)
Sodium: 144 mmol/L (ref 135–145)

## 2016-02-29 LAB — CBC
HEMATOCRIT: 30.5 % — AB (ref 36.0–46.0)
HEMOGLOBIN: 9.5 g/dL — AB (ref 12.0–15.0)
MCH: 26.2 pg (ref 26.0–34.0)
MCHC: 31.1 g/dL (ref 30.0–36.0)
MCV: 84 fL (ref 78.0–100.0)
Platelets: 305 10*3/uL (ref 150–400)
RBC: 3.63 MIL/uL — ABNORMAL LOW (ref 3.87–5.11)
RDW: 17.6 % — ABNORMAL HIGH (ref 11.5–15.5)
WBC: 18.3 10*3/uL — AB (ref 4.0–10.5)

## 2016-02-29 LAB — MAGNESIUM: MAGNESIUM: 2.1 mg/dL (ref 1.7–2.4)

## 2016-02-29 LAB — PHOSPHORUS: Phosphorus: 3.7 mg/dL (ref 2.5–4.6)

## 2016-02-29 NOTE — Progress Notes (Signed)
Nutrition Follow-up  DOCUMENTATION CODES:   Not applicable  INTERVENTION:    Continue Vital AF 1.2 via PEG at 55 ml/h (1320 ml per day) to provide 1584 kcals, 99 gm protein, 1071 ml free water daily.  NUTRITION DIAGNOSIS:   Inadequate oral intake related to inability to eat as evidenced by NPO status.  Ongoing  GOAL:   Patient will meet greater than or equal to 90% of their needs  Met with TF  MONITOR:   Vent status, Labs, TF tolerance, I & O's  REASON FOR ASSESSMENT:   Consult Enteral/tube feeding initiation and management  ASSESSMENT:   45 year old with past medical history of frontotemporal dementia, restless leg syndrome. She is a nursing home resident. She was brought to the ED with altered mental status and respiratory distress. Required intubation in the ED.  Patient remains intubated on ventilator support. MV: 9.2 L/min Temp (24hrs), Avg:100 F (37.8 C), Min:99.3 F (37.4 C), Max:100.8 F (38.2 C)  Vital AF 1.2 infusing via PEG at 55 ml/hr; regimen providing 1584 kcals, 99 gm protein, 1071 ml free water daily (12/26- MD added free water flush of 200 ml every 6 hours; total 1871 ml free water daily), which meets 95% of estimated kcal needs and 100% of estimated protein needs.   Palliative care service following; family meeting scheduled today at 1500 to discuss transition to comfort care vs trach with discharge to vent SNF.   Labs reviewed: CBGS: 114-123.   Diet Order:  Diet NPO time specified  Skin:  Wound (see comment) (unstageable to R ankle)  Last BM:  12/28- 100 ml per rectal tube  Height:   Ht Readings from Last 1 Encounters:  02/22/16 _0  (1.651 m)    Weight:   Wt Readings from Last 1 Encounters:  02/29/16 139 lb (63 kg)    Ideal Body Weight:  56.8 kg  BMI:  Body mass index is 23.13 kg/m.  Estimated Nutritional Needs:   Kcal:  9937  Protein:  90-105 gm  Fluid:  1.8 L  EDUCATION NEEDS:   No education needs identified at  this time  Roshaun Pound A. Jimmye Norman, RD, LDN, CDE Pager: [phone] After hours Pager: [phone]

## 2016-02-29 NOTE — Progress Notes (Signed)
PULMONARY / CRITICAL CARE MEDICINE   Name: [name] SchmidtChevella [name] MRN: [id number] DOB: 11/28/1970    ADMISSION DATE:  02/22/2016 CONSULTATION DATE:  02/22/16  REFERRING MD:  [name] HireKristen Ward MD  CHIEF COMPLAINT:  Acute respiratory failure, Multilobar PNA  HISTORY OF PRESENT ILLNESS:   [name] [name] is a 45 year old NHR with past medical history of frontotemporal dementia, restless leg syndrome. . Admitted w septic shock, acute respiratory failure and R multilobar PNA.   Regarding her neurologic issues. 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 and has been evaluated by [facility] in 2015. This was felt to be more consistent with frontotemporal dementia. Given the progressive nature of this disease palliation was advised. 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 The case was discussed with palliative care team at [facility] during her last admission in May 2017. Outpatient palliative care follow-up was recommended. However she remains full code.   SUBJECTIVE:     VITAL SIGNS: BP 111/79   Pulse (!) 102   Temp 99.9 F (37.7 C)   Resp 16   Ht 5\' 5"  (1.651 m)   Wt 63 kg (139 lb)   SpO2 96%   BMI 23.13 kg/m   HEMODYNAMICS:    VENTILATOR SETTINGS: Vent Mode: PRVC FiO2 (%):  [40 %] 40 % Set Rate:  [16 bmp] 16 bmp Vt Set:  [400 mL] 400 mL PEEP:  [5 cmH20] 5 cmH20 Plateau Pressure:  [17 cmH20-25 cmH20] 17 cmH20  INTAKE / OUTPUT: I/O last 3 completed shifts: In: 3270 [I.V.:345; NG/GT:2325; IV Piggyback:600] Out: 3055 [Urine:2955; Stool:100]  PHYSICAL EXAMINATION: General:  Chr ill , intubated Neuro: eyes open, tracks,does not follow commands , no obvious pain HEENT:  No thyromegaly, JVD Cardiovascular:  RRR, no MRG Lungs:  Bl scattered crackles. No wheeze Abdomen:  Distended, + BS Musculoskeletal:  Contracted arms BL Skin:  Intact  LABS:  BMET  Recent Labs Lab 02/27/16 0214 02/28/16 0400 02/29/16 0457  NA 148* 147* 144  K 4.0 3.2* 4.0  CL 115* 112*  110  CO2 23 24 26   BUN 24* 25* 27*  CREATININE 1.51* 1.35* 1.19*  GLUCOSE 109* 102* 111*    Electrolytes  Recent Labs Lab 02/25/16 0430  02/27/16 0214 02/28/16 0400 02/29/16 0457  CALCIUM 8.6*  < > 9.2 9.0 9.0  MG 1.8  --   --  2.1 2.1  PHOS 3.4  --   --  3.2 3.7  < > = values in this interval not displayed.  CBC  Recent Labs Lab 02/27/16 0214 02/28/16 0400 02/29/16 0457  WBC 15.4* 16.4* 18.3*  HGB 10.4* 9.2* 9.5*  HCT 33.0* 29.6* 30.5*  PLT 287 276 305    Coag's No results for input(s): APTT, INR in the last 168 hours.  Sepsis Markers  Recent Labs Lab 02/22/16 1539  LATICACIDVEN 2.9*    ABG  Recent Labs Lab 02/23/16 0325  PHART 7.545*  PCO2ART 24.2*  PO2ART 134*    Liver Enzymes No results for input(s): AST, ALT, ALKPHOS, BILITOT, ALBUMIN in the last 168 hours.  Cardiac Enzymes  Recent Labs Lab 02/22/16 1539 02/22/16 2139  TROPONINI <0.03 <0.03    Glucose  Recent Labs Lab 02/28/16 1135 02/28/16 1526 02/28/16 1945 02/28/16 2345 02/29/16 0345 02/29/16 0820  GLUCAP 104* 126* 112* 120* 114* 123*    Imaging Dg Chest Port 1 View  Result Date: 02/29/2016 CLINICAL DATA:  Hypoxia EXAM: PORTABLE CHEST 1 VIEW COMPARISON:  February 28, 2016 FINDINGS: Endotracheal tube  tip is 2.3 cm above the carina. Central catheter tip is at the cavoatrial junction. No pneumothorax. There is mild bibasilar atelectasis. Lungs elsewhere clear. Heart size and pulmonary vascularity are normal. No adenopathy. No bone lesions. IMPRESSION: Tube and catheter positions as described without pneumothorax. Bibasilar atelectasis. No frank edema or consolidation. Stable cardiac silhouette. Electronically Signed   By: Bretta BangWilliam  Woodruff III M.D.   On: 02/29/2016 07:07   STUDIES:  CT chest 12/21 > Complete right upper lobe consolidation and significant volume loss with occlusion of the right upper lobe bronchus by low-attenuation material, favoring mucous plugging. No  discrete obstructing mass. 3. Moderate patchy consolidation and ground-glass attenuation throughout the right middle lobe, lingula and bilateral lower lobes, favoring multilobar pneumonia and/or aspiration 12/21 bscopy >> mucus plugs Rt  CULTURES: Bcx 12/21 >> coag neg staph 1/2 Sputum Cx 12/21 > pseudomonas pan-S, MRSA Stool norovirus + Stool c diff Ag positive, toxin negative  RVP >> negative  Urine strep ag neg Urine legionella neg  ANTIBIOTICS: Vanco 12/21 >12/24 Zosyn 12/21 > Azithro 12/21 >12/23  SIGNIFICANT EVENTS: 12/21 > Admit with resp failure.   LINES/TUBES:  DISCUSSION: 45 year old with progressive frontotemporal dementia. Admitted with acute respiratory failure, multilobar GNR pneumonia/ RUL atelectasis & diarrhea due to norovirus Palliative Care discussions ongoing re: trach, goals for her care   ASSESSMENT / PLAN:  PULMONARY A: Acute hypercapnic, hypoxic respiratory failure Multilobar pneumonia/ RUL atx P:   Continue vent support, continue attempts at SBT, does not currently meet criteria for extubation.  Ongoing palliative discussion -planned for 12/27  but sister POA leaning towards trach placement  CARDIOVASCULAR A:  Sinus Tachycardia. Likely from sepsis P:  Telemetry   RENAL A:   Hypokalemia/ hypophos/ hypomag Hypernatremia Acute renal insufficiency, improving  P:   Monitor urine output and Cr Replete lytes as needed Continue free water  GASTROINTESTINAL A:   Diarrhea- norovirus; C diff positive, toxin negative  P:   Enteric precautions ct TFs  HEMATOLOGIC A:   Anemia , chronic illness P:  Follow CBC  INFECTIOUS A:   Multilobar PNA -pseudomonas, MRSA 20k only hence not treating C diff colitis, ? True pathogen given toxin negative. Have committed to treating 14 days.  Leucocytosis, fever P:   Continue PO vanco for 14 days total Cipro per pharmacy If persistent fever, restart   IV vanc for MRSA   ENDOCRINE A:   Elevated  BS P:   SSI coverage  NEUROLOGIC A:   Frontotemporal dementia P:   Palliation recommended by [facility] given the progressive nature of the disease. Appreciate palliative care input here. Discussions ongoing, but currently goals appear to be continued aggressive support including possible trach.    FAMILY  - Updates: sister 12/23  - needs more discussion - Inter-disciplinary family meet or Palliative Care meeting due by:  Done 12/27. Further discussions to be had to build on this.    Independent CC time 33 minutes  Levy Pupaobert Annalyce Lanpher, MD, PhD 02/29/2016, 11:05 AM [place] [facility] [phone] or if no answer [phone]

## 2016-02-29 NOTE — Progress Notes (Signed)
Daily Progress Note   Patient Name: [name] [name]       Date: 02/29/2016 DOB: [birth year]  Age: 45 y.o. MRN#: [id number] Attending Physician: Rigoberto Noel, MD Primary Care Physician: Garwin Brothers, MD Admit Date: 02/22/2016  Reason for Consultation/Follow-up: Establishing goals of care  Subctive: Patient with no changes. Spoke with Marcie Bal (legal guardian- papers requested) to followup with our conversation yesterday. Answered family questions re: vent SNF placement. There are no vent SNF's in  and if patient has a trach and is ventilator dependent she will need to be placed out of state. Family is still holding out "for a miracle" with strong faith that God will heal their sister and bring her back to how she was before. I very directly discussed patient's status and trajectory of her dementia. Family wants to move forward with trach placement and continue full aggressive care.  Review of Systems  Unable to perform ROS: Intubated    Length of Stay: 7  Current Medications: Scheduled Meds:  . chlorhexidine gluconate (MEDLINE KIT)  15 mL Mouth Rinse BID  . ciprofloxacin  400 mg Intravenous Q12H  . enoxaparin (LOVENOX) injection  40 mg Subcutaneous Q24H  . famotidine  20 mg Per Tube BID  . free water  200 mL Per Tube Q6H  . insulin aspart  2-6 Units Subcutaneous Q4H  . mouth rinse  15 mL Mouth Rinse QID  . vancomycin  125 mg Oral QID    Continuous Infusions: . feeding supplement (VITAL AF 1.2 CAL) 1,000 mL (02/29/16 1459)    PRN Meds: sodium chloride, acetaminophen, albuterol, fentaNYL (SUBLIMAZE) injection, ondansetron (ZOFRAN) IV  Physical Exam  Pulmonary/Chest:  intubated  Musculoskeletal:  bil contractures  Neurological:  Non responsive, does not track            Vital  Signs: BP 100/68   Pulse (!) 109   Temp 99.9 F (37.7 C)   Resp 16   Ht '5\' 5"'  (1.651 m)   Wt 63 kg (139 lb)   SpO2 98%   BMI 23.13 kg/m  SpO2: SpO2: 98 % O2 Device: O2 Device: Ventilator O2 Flow Rate:    Intake/output summary:  Intake/Output Summary (Last 24 hours) at 02/29/16 1528 Last data filed at 02/29/16 0800  Gross per 24 hour  Intake  1505 ml  Output             1405 ml  Net              100 ml   LBM: Last BM Date: 02/28/16 Baseline Weight: Weight: 65.8 kg (145 lb) Most recent weight: Weight: 63 kg (139 lb)       Palliative Assessment/Data: PPS: 10%     Patient Active Problem List   Diagnosis Date Noted  . Palliative care by specialist   . Goals of care, counseling/discussion   . Pressure injury of skin 02/23/2016  . PNA (pneumonia) 02/22/2016  . Acute respiratory failure with hypoxia and hypercapnia (HCC)   . Closed fracture of right tibia and fibula 07/28/2015  . Staphylococcus hominis sepsis ([place]) 01/24/2015  . UTI (urinary tract infection) due to Enterococcus 01/24/2015  . Severe sepsis ([place])   . Bacteremia 10/19/2013  . Wheezes 10/19/2013  . UTI (lower urinary tract infection) 10/17/2013  . Sinus tachycardia 10/17/2013  . Dysphagia 10/17/2013  . PEG tube malfunction ([place]) 10/17/2013  . HCAP (healthcare-associated pneumonia) 03/12/2013  . SIRS (systemic inflammatory response syndrome) ([place]) 03/12/2013  . Hyperkalemia 03/12/2013  . Healthcare associated bacterial pneumonia 03/12/2013  . Frontotemporal dementia 02/07/2013  . Dysphasia 02/07/2013  . Constipation 02/07/2013    Palliative Care Assessment & Plan   Patient Profile: 45 y.o. female  with past medical history of frontotemporal dementia, aphasia, ?CJD, depression, living at [facility] admitted on 02/22/2016 with altered mental status and respiratory distress requiring intubation. Workup reveals pneumonia with sepsis. She has been unable to wean from the ventilator and  PCCM is discussing tracheostomy. She has a PEG already in place.  Assessment/Recommendations/Plan   Proceed with trach placement  Again requested Marcie Bal to bring guardianship papers for patient's chart  Goals of Care and Additional Recommendations:  Limitations on Scope of Treatment: Full Scope Treatment  Code Status:  DNR  Prognosis:   Unable to determine  Discharge Planning:  To Be Determined  Care plan was discussed with patient's sister, legal guardian, Marcie Bal  Thank you for allowing the Palliative Medicine Team to assist in the care of this patient.   Time In: 1500 Time Out: 1535 Total Time 35 minutes Prolonged Time Billed No      Greater than 50%  of this time was spent counseling and coordinating care related to the above assessment and plan.  Mariana Kaufman, AGNP-C Palliative Medicine   Please contact Palliative Medicine Team phone at [phone] for questions and concerns.

## 2016-03-01 ENCOUNTER — Inpatient Hospital Stay (HOSPITAL_COMMUNITY): Payer: Medicare Other

## 2016-03-01 LAB — CBC
HCT: 31.7 % — ABNORMAL LOW (ref 36.0–46.0)
Hemoglobin: 9.8 g/dL — ABNORMAL LOW (ref 12.0–15.0)
MCH: 25.9 pg — AB (ref 26.0–34.0)
MCHC: 30.9 g/dL (ref 30.0–36.0)
MCV: 83.6 fL (ref 78.0–100.0)
PLATELETS: 335 10*3/uL (ref 150–400)
RBC: 3.79 MIL/uL — ABNORMAL LOW (ref 3.87–5.11)
RDW: 17.5 % — AB (ref 11.5–15.5)
WBC: 20.9 10*3/uL — AB (ref 4.0–10.5)

## 2016-03-01 LAB — GLUCOSE, CAPILLARY
GLUCOSE-CAPILLARY: 120 mg/dL — AB (ref 65–99)
Glucose-Capillary: 122 mg/dL — ABNORMAL HIGH (ref 65–99)
Glucose-Capillary: 119 mg/dL — ABNORMAL HIGH (ref 65–99)
Glucose-Capillary: 112 mg/dL — ABNORMAL HIGH (ref 65–99)
Glucose-Capillary: 131 mg/dL — ABNORMAL HIGH (ref 65–99)

## 2016-03-01 LAB — BASIC METABOLIC PANEL
ANION GAP: 8 (ref 5–15)
BUN: 28 mg/dL — AB (ref 6–20)
CALCIUM: 9 mg/dL (ref 8.9–10.3)
CO2: 27 mmol/L (ref 22–32)
CREATININE: 1.15 mg/dL — AB (ref 0.44–1.00)
Chloride: 110 mmol/L (ref 101–111)
GFR calc Af Amer: >60 mL/min (ref >60)
GFR, EST NON AFRICAN AMERICAN: 56 mL/min — AB (ref >60)
GLUCOSE: 123 mg/dL — AB (ref 65–99)
Potassium: 3.6 mmol/L (ref 3.5–5.1)
Sodium: 145 mmol/L (ref 135–145)

## 2016-03-01 LAB — MAGNESIUM: Magnesium: 2 mg/dL (ref 1.7–2.4)

## 2016-03-01 LAB — PHOSPHORUS: Phosphorus: 3.4 mg/dL (ref 2.5–4.6)

## 2016-03-01 NOTE — Progress Notes (Signed)
Pharmacy Antibiotic Note  [name] SchmidtChevella [name] is a 45 y.o. female admitted on 02/22/2016 with  possible pneumonia (also with Cdiff and Norovirus). Pharmacy consulted to dose ciprofloxacin. Tmax 100.6, WBC 20.9, renal function improving. Currently on day 9 of effective abx for pseudomonas   Plan: Continue ciprofloxacin IV 400 mg q12h Continue po vanc for total of 14 days Monitor renal function, clinical status, and culture results F/u LOT  Height: 5\' 5"  (165.1 cm) Weight: 140 lb 3.2 oz (63.6 kg) IBW/kg (Calculated) : 57  Temp (24hrs), Avg:99.8 F (37.7 C), Min:99 F (37.2 C), Max:100.6 F (38.1 C)   Recent Labs Lab 02/24/16 0930  02/26/16 0440 02/27/16 0214 02/28/16 0400 02/29/16 0457 03/01/16 0430  WBC  --   < > 17.0* 15.4* 16.4* 18.3* 20.9*  CREATININE  --   < > 1.46* 1.51* 1.35* 1.19* 1.15*  VANCOTROUGH 4*  --   --   --   --   --   --   < > = values in this interval not displayed.  Estimated Creatinine Clearance: 55 mL/min (by C-G formula based on SCr of 1.15 mg/dL (H)).    Allergies  Allergen Reactions  . Sulfa Antibiotics Other (See Comments)    Per MAR    Antimicrobials this admission: Vancomycin 12/21 >> 12/24 Zosyn 12/21 >> 12/24 Azithromycin 12/21 >> 12/23 PO Vanc 12/21 >> Cipro 12/25 >>  Dose adjustments this admission: 12/23 VT 4 (auto-diuresed) - Reload and increase MD  Microbiology results: 12/21 BCx: 1/2 GPC clusters (BCID- CONS) 12/21 UCx: multiple species, none predominant 12/21 BAL: >100K Pseudomonas (sensitive to Zosyn/Cipro); 20K 20k MRSA 12/21 C. Diff Ag positive, toxin negative, PCR positive 12/21: Legionella UAg: ip 12/21: Strep UAg: negative 12/21: RVP: negative 12/21 GI Panel: + Norovirus 12/21: MRSA PCR negative 12/21: Influenza PCR negative  Arlean Hoppingorey M. Newman PiesBall, PharmD, BCPS Clinical Pharmacist Pager [phone] 03/01/2016

## 2016-03-01 NOTE — Care Management Note (Signed)
Case Management Note  Patient Details  Name: [name] [name] MRN: [id number] Date of Birth: [birth year]  Subjective/Objective: Pt admitted on 02/22/16 with acute respiratory failure, multilobar PNA.  PTA, pt resided at [facility].  Pt's POA is her sister, Sashia Campas.                     Action/Plan: Pt remains on ventilator, and is not weaning.  Per MD, pt will not be able to wean from ventilator, and sister is considering tracheostomy.  Palliative Medicine Team to meet with sister on 02/28/16 at 2pm for Goals of Care meeting.  Will consult CSW to facilitate return to SNF/possible vent SNF, should sister decide that she wants trach.    Expected Discharge Date:                  Expected Discharge Plan:  Skilled Nursing Facility  In-House Referral:  Clinical Social Work  Discharge planning Services  CM Consult  Post Acute Care Choice:    Choice offered to:     DME Arranged:    DME Agency:     HH Arranged:    [place] Agency:     Status of Service:  In process, will continue to follow  If discussed at Long Length of Stay Meetings, dates discussed:    Additional Comments: 03/01/2016 [name], RN, BSN [phone] Palliative met with pt family; plan on moving forward with trach.  Family made aware that pt will likely be placed out of state at discharge.  CM will continue to follow for discharge needs  [name], RN 03/01/2016, 11:51 AM

## 2016-03-01 NOTE — Progress Notes (Signed)
PULMONARY / CRITICAL CARE MEDICINE   Name: [name] [name] MRN: [id number] DOB: 11/13/1970    ADMISSION DATE:  02/22/2016 CONSULTATION DATE:  02/22/16  REFERRING MD:  Baxter HireKristen Ward MD  CHIEF COMPLAINT:  Acute respiratory failure, Multilobar PNA  HISTORY OF PRESENT ILLNESS:   [name] [name] is a 45 year old NHR with past medical history of frontotemporal dementia, restless leg syndrome. . Admitted w septic shock, acute respiratory failure and R multilobar PNA.   Regarding her neurologic issues. 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 and has been evaluated by [facility] in 2015. This was felt to be more consistent with frontotemporal dementia. Given the progressive nature of this disease palliation was advised. 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 The case was discussed with palliative care team at [facility] during her last admission in May 2017. Outpatient palliative care follow-up was recommended. However she remains full code.   SUBJECTIVE:     VITAL SIGNS: BP 104/78   Pulse (!) 105   Temp 100 F (37.8 C)   Resp 16   Ht 5\' 5"  (1.651 m)   Wt 63.6 kg (140 lb 3.2 oz)   SpO2 98%   BMI 23.33 kg/m   HEMODYNAMICS:    VENTILATOR SETTINGS: Vent Mode: PRVC FiO2 (%):  [40 %] 40 % Set Rate:  [16 bmp] 16 bmp Vt Set:  [400 mL] 400 mL PEEP:  [5 cmH20] 5 cmH20 Pressure Support:  [15 cmH20] 15 cmH20 Plateau Pressure:  [22 cmH20-26 cmH20] 23 cmH20  INTAKE / OUTPUT: I/O last 3 completed shifts: In: 2940 [I.V.:160; NG/GT:2180; IV Piggyback:600] Out: 2475 [Urine:2325; Stool:150]  PHYSICAL EXAMINATION: General:  Chr ill , intubated Neuro: eyes open, tracks,does not follow commands , no obvious pain HEENT:  No thyromegaly, JVD Cardiovascular:  RRR, no MRG Lungs:  Bl scattered crackles. No wheeze Abdomen:  Distended, + BS Musculoskeletal:  Contracted arms BL Skin:  Intact  LABS:  BMET  Recent Labs Lab 02/28/16 0400 02/29/16 0457 03/01/16 0430  NA  147* 144 145  K 3.2* 4.0 3.6  CL 112* 110 110  CO2 24 26 27   BUN 25* 27* 28*  CREATININE 1.35* 1.19* 1.15*  GLUCOSE 102* 111* 123*    Electrolytes  Recent Labs Lab 02/28/16 0400 02/29/16 0457 03/01/16 0430  CALCIUM 9.0 9.0 9.0  MG 2.1 2.1 2.0  PHOS 3.2 3.7 3.4    CBC  Recent Labs Lab 02/28/16 0400 02/29/16 0457 03/01/16 0430  WBC 16.4* 18.3* 20.9*  HGB 9.2* 9.5* 9.8*  HCT 29.6* 30.5* 31.7*  PLT 276 305 335    Coag's No results for input(s): APTT, INR in the last 168 hours.  Sepsis Markers No results for input(s): LATICACIDVEN, PROCALCITON, O2SATVEN in the last 168 hours.  ABG No results for input(s): PHART, PCO2ART, PO2ART in the last 168 hours.  Liver Enzymes No results for input(s): AST, ALT, ALKPHOS, BILITOT, ALBUMIN in the last 168 hours.  Cardiac Enzymes No results for input(s): TROPONINI, PROBNP in the last 168 hours.  Glucose  Recent Labs Lab 02/29/16 1133 02/29/16 1618 02/29/16 2018 02/29/16 2348 03/01/16 0353 03/01/16 0807  GLUCAP 101* 132* 117* 109* 122* 119*    Imaging Dg Chest Port 1 View  Result Date: 03/01/2016 CLINICAL DATA:  Hypoxia EXAM: PORTABLE CHEST 1 VIEW COMPARISON:  February 29, 2016 FINDINGS: Endotracheal tube tip is 3.2 cm above the carina. Central catheter tip is at the cavoatrial junction. No pneumothorax. There is patchy consolidation in the left base  with small left pleural effusion. Right lung is clear. Heart is upper normal in size with pulmonary vascularity within normal limits. No adenopathy. No bone lesions. IMPRESSION: Tube and catheter positions as described without pneumothorax. Consolidation left base with small left pleural effusion. Right lung clear. Electronically Signed   By: Bretta BangWilliam  Woodruff III M.D.   On: 03/01/2016 07:50   STUDIES:  CT chest 12/21 > Complete right upper lobe consolidation and significant volume loss with occlusion of the right upper lobe bronchus by low-attenuation material, favoring  mucous plugging. No discrete obstructing mass. 3. Moderate patchy consolidation and ground-glass attenuation throughout the right middle lobe, lingula and bilateral lower lobes, favoring multilobar pneumonia and/or aspiration 12/21 bscopy >> mucus plugs Rt  CULTURES: Bcx 12/21 >> coag neg staph 1/2 Sputum Cx 12/21 > pseudomonas pan-S, MRSA Stool norovirus + Stool c diff Ag positive, toxin negative  RVP >> negative  Urine strep ag neg Urine legionella neg  ANTIBIOTICS: Vanco 12/21 >12/24 Zosyn 12/21 > 12/25 Azithro 12/21 >12/23 cipro 12/25 >>   SIGNIFICANT EVENTS: 12/21 > Admit with resp failure.   LINES/TUBES:  DISCUSSION: 45 year old with progressive frontotemporal dementia. Admitted with acute respiratory failure, multilobar GNR pneumonia/ RUL atelectasis & diarrhea due to norovirus Palliative Care discussions ongoing re: trach, goals for her care   ASSESSMENT / PLAN:  PULMONARY A: Acute hypercapnic, hypoxic respiratory failure Multilobar pneumonia/ RUL atx P:   Continue vent support, continue attempts at SBT, does not currently meet criteria for extubation.  Ongoing palliative discussion, see notes from 12/27 and 28. The family remains optimistic and would want trach / PEG, SNF placement  CARDIOVASCULAR A:  Sinus Tachycardia. Likely from sepsis P:  Telemetry   RENAL A:   Hypokalemia/ hypophos/ hypomag Hypernatremia Acute renal insufficiency, improving  P:   Monitor urine output and Cr Replete lytes as needed Continue free water  GASTROINTESTINAL A:   Diarrhea- norovirus; C diff positive, toxin negative  P:   Enteric precautions ct TFs  HEMATOLOGIC A:   Anemia , chronic illness P:  Follow CBC  INFECTIOUS A:   Multilobar PNA -pseudomonas, MRSA 20k only hence not treating C diff colitis, ? True pathogen given toxin negative. Have committed to treating 14 days.  Leucocytosis, fever P:   Continue PO vanco for 14 days total Cipro per pharmacy,  currently day 9 abx If persistent fever, restart   IV vanc for MRSA   ENDOCRINE A:   Elevated BS P:   SSI coverage  NEUROLOGIC A:   Frontotemporal dementia P:   Palliation recommended by [facility] given the progressive nature of the disease. Appreciate palliative care input here. Discussions ongoing, but currently goals appear to be continued aggressive support including possible trach.    FAMILY  - Updates: PC has led discussions with HCPOA 12/27 - 29. Current plan would be continued aggressive care, trach if indicated.  - Inter-disciplinary family meet or Palliative Care meeting due by:  As above   Independent CC time 31 minutes  Levy Pupaobert Hughey Rittenberry, MD, PhD 03/01/2016, 11:38 AM [place] [facility] [phone] or if no answer [phone]

## 2016-03-02 LAB — GLUCOSE, CAPILLARY
GLUCOSE-CAPILLARY: 105 mg/dL — AB (ref 65–99)
GLUCOSE-CAPILLARY: 108 mg/dL — AB (ref 65–99)
GLUCOSE-CAPILLARY: 120 mg/dL — AB (ref 65–99)
Glucose-Capillary: 110 mg/dL — ABNORMAL HIGH (ref 65–99)

## 2016-03-02 LAB — CBC
HEMATOCRIT: 30.4 % — AB (ref 36.0–46.0)
Hemoglobin: 9.5 g/dL — ABNORMAL LOW (ref 12.0–15.0)
MCH: 26 pg (ref 26.0–34.0)
MCHC: 31.3 g/dL (ref 30.0–36.0)
MCV: 83.3 fL (ref 78.0–100.0)
Platelets: 338 10*3/uL (ref 150–400)
RBC: 3.65 MIL/uL — AB (ref 3.87–5.11)
RDW: 17.4 % — AB (ref 11.5–15.5)
WBC: 19.9 10*3/uL — AB (ref 4.0–10.5)

## 2016-03-02 LAB — BASIC METABOLIC PANEL
ANION GAP: 11 (ref 5–15)
BUN: 27 mg/dL — AB (ref 6–20)
CALCIUM: 9 mg/dL (ref 8.9–10.3)
CO2: 25 mmol/L (ref 22–32)
Chloride: 107 mmol/L (ref 101–111)
Creatinine, Ser: 1.16 mg/dL — ABNORMAL HIGH (ref 0.44–1.00)
GFR calc Af Amer: >60 mL/min (ref >60)
GFR calc non Af Amer: 56 mL/min — ABNORMAL LOW (ref >60)
Glucose, Bld: 117 mg/dL — ABNORMAL HIGH (ref 65–99)
Potassium: 3.3 mmol/L — ABNORMAL LOW (ref 3.5–5.1)
Sodium: 143 mmol/L (ref 135–145)

## 2016-03-02 LAB — MAGNESIUM: Magnesium: 1.9 mg/dL (ref 1.7–2.4)

## 2016-03-02 LAB — PHOSPHORUS: Phosphorus: 3.4 mg/dL (ref 2.5–4.6)

## 2016-03-02 MED ORDER — POTASSIUM CHLORIDE 20 MEQ/15ML (10%) PO SOLN
20.0000 meq | ORAL | Status: AC
  Administered 2016-03-02 (×2): 20 meq
  Filled 2016-03-02 (×2): qty 15

## 2016-03-02 NOTE — Progress Notes (Signed)
PULMONARY / CRITICAL CARE MEDICINE   Name: [name] SchmidtChevella [name] MRN: [id number] DOB: 01/19/1971    ADMISSION DATE:  02/22/2016 CONSULTATION DATE:  02/22/16  REFERRING MD:  Baxter HireKristen Ward MD  CHIEF COMPLAINT:  Acute respiratory failure, Multilobar PNA  HISTORY OF PRESENT ILLNESS:   [name] [name] is a 45 year old NHR with past medical history of frontotemporal dementia, restless leg syndrome. . Admitted w septic shock, acute respiratory failure and R multilobar PNA.   Regarding her neurologic issues. 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 and has [facility]. This was felt to be more consistent with frontotemporal dementia. Given the progressive nature of this disease palliation was advised. 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 The case was discussed with palliative care team at [facility] during her last admission in May 2017. Outpatient palliative care follow-up was recommended. However she remains full code.   SUBJECTIVE:  Reported to have been apneic on SBT this am.  No sedating meds given   VITAL SIGNS: BP 93/65 (BP Location: Right Arm)   Pulse 96   Temp 100 F (37.8 C)   Resp 16   Ht 5\' 5"  (1.651 m)   Wt 63.7 kg (140 lb 6.9 oz)   SpO2 100%   BMI 23.37 kg/m   HEMODYNAMICS:    VENTILATOR SETTINGS: Vent Mode: PRVC FiO2 (%):  [40 %] 40 % Set Rate:  [16 bmp] 16 bmp Vt Set:  [400 mL] 400 mL PEEP:  [5 cmH20] 5 cmH20 Plateau Pressure:  [18 cmH20-28 cmH20] 18 cmH20  INTAKE / OUTPUT: I/O last 3 completed shifts: In: 3100 [I.V.:120; NG/GT:2380; IV Piggyback:600] Out: 1980 [Urine:1980]  PHYSICAL EXAMINATION: General:  Chr ill , intubated Neuro: eyes open, tracks,does not follow commands , no obvious pain HEENT:  No thyromegaly, JVD Cardiovascular:  RRR, no MRG Lungs:  Bl scattered crackles. No wheeze Abdomen:  Distended, + BS Musculoskeletal:  Contracted arms BL Skin:  Intact  LABS:  BMET  Recent Labs Lab  02/29/16 0457 03/01/16 0430 03/02/16 0441  NA 144 145 143  K 4.0 3.6 3.3*  CL 110 110 107  CO2 26 27 25   BUN 27* 28* 27*  CREATININE 1.19* 1.15* 1.16*  GLUCOSE 111* 123* 117*    Electrolytes  Recent Labs Lab 02/29/16 0457 03/01/16 0430 03/02/16 0441  CALCIUM 9.0 9.0 9.0  MG 2.1 2.0 1.9  PHOS 3.7 3.4 3.4    CBC  Recent Labs Lab 02/29/16 0457 03/01/16 0430 03/02/16 0441  WBC 18.3* 20.9* 19.9*  HGB 9.5* 9.8* 9.5*  HCT 30.5* 31.7* 30.4*  PLT 305 335 338    Coag's No results for input(s): APTT, INR in the last 168 hours.  Sepsis Markers No results for input(s): LATICACIDVEN, PROCALCITON, O2SATVEN in the last 168 hours.  ABG No results for input(s): PHART, PCO2ART, PO2ART in the last 168 hours.  Liver Enzymes No results for input(s): AST, ALT, ALKPHOS, BILITOT, ALBUMIN in the last 168 hours.  Cardiac Enzymes No results for input(s): TROPONINI, PROBNP in the last 168 hours.  Glucose  Recent Labs Lab 03/01/16 0353 03/01/16 0807 03/01/16 1137 03/01/16 1520 03/01/16 1956 03/02/16 0803  GLUCAP 122* 119* 120* 112* 131* 110*    Imaging No results found. STUDIES:  CT chest 12/21 > Complete right upper lobe consolidation and significant volume loss with occlusion of the right upper lobe bronchus by low-attenuation material, favoring mucous plugging. No discrete obstructing mass. 3. Moderate patchy consolidation and ground-glass attenuation  throughout the right middle lobe, lingula and bilateral lower lobes, favoring multilobar pneumonia and/or aspiration 12/21 bscopy >> mucus plugs Rt  CULTURES: Bcx 12/21 >> coag neg staph 1/2 Sputum Cx 12/21 > pseudomonas pan-S, MRSA Stool norovirus + Stool c diff Ag positive, toxin negative  RVP >> negative  Urine strep ag neg Urine legionella neg  ANTIBIOTICS: Vanco 12/21 >12/24 Zosyn 12/21 > 12/25 Azithro 12/21 >12/23 cipro 12/25 >> 12/30  SIGNIFICANT EVENTS: 12/21 > Admit with resp failure.    LINES/TUBES:  DISCUSSION: 45 year old with progressive frontotemporal dementia. Admitted with acute respiratory failure, multilobar GNR pneumonia/ RUL atelectasis & diarrhea due to norovirus. Failing SBT's  Palliative Care discussions ongoing: Family will want trach + PEG  ASSESSMENT / PLAN:  PULMONARY A: Acute hypercapnic, hypoxic respiratory failure Multilobar pneumonia/ RUL atx P:   Continue vent support, continue attempts at SBT, does not currently meet criteria for extubation. Apneic on trial thia am Ongoing palliative discussion, see notes from 12/27 and 28. The family remains optimistic and would want trach / PEG, SNF placement  CARDIOVASCULAR A:  Sinus Tachycardia. Likely from sepsis P:  Telemetry   RENAL A:   Hypokalemia/ hypophos/ hypomag Hypernatremia, improved Acute renal insufficiency, improved Note 8L positive 12/30 P:   Monitor urine output and Cr Replete lytes as needed Continue free water  GASTROINTESTINAL A:   Diarrhea- norovirus; C diff positive, toxin negative  P:   Enteric precautions ct TFs abx as below  HEMATOLOGIC A:   Anemia , chronic illness P:  Follow CBC  INFECTIOUS A:   Multilobar PNA -pseudomonas, MRSA 20k only hence not treated C diff colitis, ? True pathogen given toxin negative. Have committed to treating 14 days.  Leucocytosis, fever P:   Continue PO vanco for 14 days total, 12/30 = day 10/14 Cipro per pharmacy, currently day 10 abx > d/c on 12/30 If decompensates restart IV vanc for MRSA   ENDOCRINE A:   Elevated BS P:   SSI coverage  NEUROLOGIC A:   Frontotemporal dementia P:   Palliation recommended by [facility] given the progressive nature of the disease. Appreciate palliative care input here. Discussions ongoing, but currently goals appear to be continued aggressive support including possible trach.    FAMILY  - Updates: PC has led discussions with HCPOA 12/27 - 29. Current plan would be continued  aggressive care, trach if indicated.  - Inter-disciplinary family meet or Palliative Care meeting due by:  As above   Independent CC time 32 minutes  Levy Pupaobert Byrum, MD, PhD 03/02/2016, 9:41 AM [place] [facility] [phone] or if no answer [phone]

## 2016-03-02 NOTE — Progress Notes (Signed)
[facility] AD[facility]AC[facility]OTOCOL FOR AM LAB REPLACEMENT ONLY  The patient does [facility] based on the criteria listed below:   1. Is GFR >/= 40 ml/min? Yes.    Patient's GFR today is 56 2. Is urine output >/= 0.5 ml/kg/hr for the last 6 hours? Yes.   Patient's UOP is 0.8 ml/kg/hr 3. Is BUN < 60 mg/dL? Yes.    Patient's BUN today is 27 4. Abnormal electrolyte(s): k 3.3 5. Ordered repletion with: prptocol 6. If a panic level lab has been reported, has the CCM MD in charge been notified? No..   Physician:    Markus DaftWHELAN, Valerian Jewel A 03/02/2016 5:34 AM

## 2016-03-03 LAB — BASIC METABOLIC PANEL
Anion gap: 9 (ref 5–15)
BUN: 28 mg/dL — AB (ref 6–20)
CALCIUM: 8.9 mg/dL (ref 8.9–10.3)
CO2: 26 mmol/L (ref 22–32)
CREATININE: 1.13 mg/dL — AB (ref 0.44–1.00)
Chloride: 110 mmol/L (ref 101–111)
GFR calc Af Amer: >60 mL/min (ref >60)
GFR, EST NON AFRICAN AMERICAN: 57 mL/min — AB (ref >60)
GLUCOSE: 131 mg/dL — AB (ref 65–99)
POTASSIUM: 3.1 mmol/L — AB (ref 3.5–5.1)
SODIUM: 145 mmol/L (ref 135–145)

## 2016-03-03 LAB — GLUCOSE, CAPILLARY
GLUCOSE-CAPILLARY: 135 mg/dL — AB (ref 65–99)
Glucose-Capillary: 101 mg/dL — ABNORMAL HIGH (ref 65–99)
Glucose-Capillary: 110 mg/dL — ABNORMAL HIGH (ref 65–99)
Glucose-Capillary: 112 mg/dL — ABNORMAL HIGH (ref 65–99)
Glucose-Capillary: 114 mg/dL — ABNORMAL HIGH (ref 65–99)
Glucose-Capillary: 127 mg/dL — ABNORMAL HIGH (ref 65–99)

## 2016-03-03 MED ORDER — POTASSIUM CHLORIDE 20 MEQ/15ML (10%) PO SOLN
30.0000 meq | ORAL | Status: AC
  Administered 2016-03-03 (×2): 30 meq
  Filled 2016-03-03 (×2): qty 30

## 2016-03-03 MED ORDER — POTASSIUM CHLORIDE 20 MEQ/15ML (10%) PO SOLN
30.0000 meq | Freq: Two times a day (BID) | ORAL | Status: AC
  Administered 2016-03-03 (×2): 30 meq
  Filled 2016-03-03 (×2): qty 30

## 2016-03-03 MED ORDER — FUROSEMIDE 10 MG/ML IJ SOLN
40.0000 mg | Freq: Two times a day (BID) | INTRAMUSCULAR | Status: DC
  Administered 2016-03-03 – 2016-03-05 (×5): 40 mg via INTRAVENOUS
  Filled 2016-03-03 (×6): qty 4

## 2016-03-03 NOTE — Progress Notes (Signed)
PULMONARY / CRITICAL CARE MEDICINE   Name: [name] [name] MRN: [id number] DOB: 03/18/1970    ADMISSION DATE:  02/22/2016 CONSULTATION DATE:  02/22/16  REFERRING MD:  Baxter HireKristen Ward MD  CHIEF COMPLAINT:  Acute respiratory failure, Multilobar PNA  HISTORY OF PRESENT ILLNESS:   [name] is a 45 year old NHR with past medical history of frontotemporal dementia, restless leg syndrome. . Admitted w septic shock, acute respiratory failure and R multilobar PNA.   Regarding her neurologic issues. 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 and [facility]. This was felt to be more consistent with frontotemporal dementia. Given the progressive nature of this disease palliation was advised. 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 The case was discussed with palliative care team at [facility] during her last admission in May 2017. Outpatient palliative care follow-up was recommended. However she remains full code.   SUBJECTIVE:  Low Vt's on SBT this am, even on PSV 20 Frothy secretions reported  VITAL SIGNS: BP 115/73   Pulse (!) 110   Temp (!) 100.6 F (38.1 C)   Resp (!) 24   Ht 5\' 5"  (1.651 m)   Wt 63.2 kg (139 lb 5.3 oz)   SpO2 92%   BMI 23.19 kg/m   HEMODYNAMICS:    VENTILATOR SETTINGS: Vent Mode: PRVC FiO2 (%):  [40 %] 40 % Set Rate:  [16 bmp] 16 bmp Vt Set:  [400 mL] 400 mL PEEP:  [5 cmH20] 5 cmH20 Pressure Support:  [20 cmH20] 20 cmH20 Plateau Pressure:  [21 cmH20-28 cmH20] 27 cmH20  INTAKE / OUTPUT: I/O last 3 completed shifts: In: 3310 [I.V.:130; NG/GT:2780; IV Piggyback:400] Out: 1795 [Urine:1795]  PHYSICAL EXAMINATION: General:  Chr ill , intubated Neuro: eyes open, tracks,does not follow commands , no obvious pain HEENT:  No thyromegaly, JVD Cardiovascular:  RRR, no MRG Lungs:  Bl scattered crackles. No wheeze Abdomen:  Distended, + BS Musculoskeletal:  Contracted arms BL Skin:   Intact  LABS:  BMET  Recent Labs Lab 03/01/16 0430 03/02/16 0441 03/03/16 0415  NA 145 143 145  K 3.6 3.3* 3.1*  CL 110 107 110  CO2 27 25 26   BUN 28* 27* 28*  CREATININE 1.15* 1.16* 1.13*  GLUCOSE 123* 117* 131*    Electrolytes  Recent Labs Lab 02/29/16 0457 03/01/16 0430 03/02/16 0441 03/03/16 0415  CALCIUM 9.0 9.0 9.0 8.9  MG 2.1 2.0 1.9  --   PHOS 3.7 3.4 3.4  --     CBC  Recent Labs Lab 02/29/16 0457 03/01/16 0430 03/02/16 0441  WBC 18.3* 20.9* 19.9*  HGB 9.5* 9.8* 9.5*  HCT 30.5* 31.7* 30.4*  PLT 305 335 338    Coag's No results for input(s): APTT, INR in the last 168 hours.  Sepsis Markers No results for input(s): LATICACIDVEN, PROCALCITON, O2SATVEN in the last 168 hours.  ABG No results for input(s): PHART, PCO2ART, PO2ART in the last 168 hours.  Liver Enzymes No results for input(s): AST, ALT, ALKPHOS, BILITOT, ALBUMIN in the last 168 hours.  Cardiac Enzymes No results for input(s): TROPONINI, PROBNP in the last 168 hours.  Glucose  Recent Labs Lab 03/02/16 1252 03/02/16 1618 03/02/16 2012 03/03/16 0010 03/03/16 0411 03/03/16 0818  GLUCAP 105* 108* 120* 112* 110* 101*    Imaging No results found. STUDIES:  CT chest 12/21 > Complete right upper lobe consolidation and significant volume loss with occlusion of the right upper lobe bronchus by low-attenuation material, favoring  mucous plugging. No discrete obstructing mass. 3. Moderate patchy consolidation and ground-glass attenuation throughout the right middle lobe, lingula and bilateral lower lobes, favoring multilobar pneumonia and/or aspiration 12/21 bscopy >> mucus plugs Rt  CULTURES: Bcx 12/21 >> coag neg staph 1/2 Sputum Cx 12/21 > pseudomonas pan-S, MRSA Stool norovirus + Stool c diff Ag positive, toxin negative  RVP >> negative  Urine strep ag neg Urine legionella neg  ANTIBIOTICS: Vanco 12/21 >12/24 Zosyn 12/21 > 12/25 Azithro 12/21 >12/23 cipro 12/25 >>  12/30  SIGNIFICANT EVENTS: 12/21 > Admit with resp failure.   LINES/TUBES:  DISCUSSION: 45 year old with progressive frontotemporal dementia. Admitted with acute respiratory failure, multilobar GNR pneumonia/ RUL atelectasis & diarrhea due to norovirus. Failing SBT's  Palliative Care discussions ongoing: Family will want trach + PEG  ASSESSMENT / PLAN:  PULMONARY A: Acute hypercapnic, hypoxic respiratory failure Multilobar pneumonia/ RUL atx P:   Continue vent support, continue attempts at SBT, does not currently meet criteria for extubation. Not tolerating high PS due to low Vt Ongoing palliative discussion, see notes from 12/27 and 28. The family remains optimistic and would want trach / PEG, SNF placement  CARDIOVASCULAR A:  Sinus Tachycardia. Likely from sepsis P:  Telemetry   RENAL A:   Hypokalemia/ hypophos/ hypomag Hypernatremia, improved Acute renal insufficiency, improved Note 8L positive 12/30 P:   Monitor urine output and Cr Replete lytes as needed Continue free water Start diuresis 12/31  GASTROINTESTINAL A:   Diarrhea- norovirus; C diff positive, toxin negative  P:   Enteric precautions ct TFs abx as below  HEMATOLOGIC A:   Anemia , chronic illness P:  Follow CBC  INFECTIOUS A:   Multilobar PNA -pseudomonas, MRSA 20k only hence not treated C diff colitis, ? True pathogen given toxin negative. Have committed to treating 14 days.  Leucocytosis, fever P:   Continue PO vanco for 14 days total, 12/31 = day 11/14 Cipro per pharmacy, completed on 12/30 If decompensates consider restart IV vanc for MRSA   ENDOCRINE A:   Elevated BS P:   SSI coverage  NEUROLOGIC A:   Frontotemporal dementia P:   Palliation recommended by [facility] given the progressive nature of the disease. Appreciate palliative care input here. Discussions ongoing, but currently goals appear to be continued aggressive support including possible trach.    FAMILY  -  Updates: PC has led discussions with HCPOA 12/27 - 29. Current plan would be continued aggressive care, trach if indicated.  - Inter-disciplinary family meet or Palliative Care meeting due by:  As above   Independent CC time 31 minutes  Levy Pupaobert Arine Foley, MD, PhD 03/03/2016, 9:28 AM [place] [facility] [phone] or if no answer [phone]

## 2016-03-03 NOTE — Progress Notes (Signed)
[facility]OC[facility] FOR AM LAB REPLACEMENT ONLY  The patient does apply for the [facility] Electrolyte Replacment Protocol based on the criteria listed below:   1. Is GFR >/= 40 ml/min? Yes.    Patient's GFR today is 57 2. Is urine output >/= 0.5 ml/kg/hr for the last 6 hours? Yes.   Patient's UOP is 0.6 ml/kg/hr 3. Is BUN < 60 mg/dL? Yes.    Patient's BUN today is 28 4. Abnormal electrolyte(s): K 3.1 5. Ordered repletion with: protocol 6. If a panic level lab has been reported, has the CCM MD in charge been notified? No..   Physician:    Markus DaftWHELAN, [name] Johann A 03/03/2016 5:27 AM

## 2016-03-04 ENCOUNTER — Inpatient Hospital Stay (HOSPITAL_COMMUNITY): Payer: Medicare Other

## 2016-03-04 LAB — GLUCOSE, CAPILLARY
GLUCOSE-CAPILLARY: 119 mg/dL — AB (ref 65–99)
GLUCOSE-CAPILLARY: 128 mg/dL — AB (ref 65–99)
GLUCOSE-CAPILLARY: 134 mg/dL — AB (ref 65–99)
GLUCOSE-CAPILLARY: 141 mg/dL — AB (ref 65–99)
Glucose-Capillary: 117 mg/dL — ABNORMAL HIGH (ref 65–99)
Glucose-Capillary: 129 mg/dL — ABNORMAL HIGH (ref 65–99)
Glucose-Capillary: 127 mg/dL — ABNORMAL HIGH (ref 65–99)

## 2016-03-04 LAB — BASIC METABOLIC PANEL
ANION GAP: 12 (ref 5–15)
BUN: 34 mg/dL — ABNORMAL HIGH (ref 6–20)
CHLORIDE: 107 mmol/L (ref 101–111)
CO2: 27 mmol/L (ref 22–32)
Calcium: 9.7 mg/dL (ref 8.9–10.3)
Creatinine, Ser: 1.21 mg/dL — ABNORMAL HIGH (ref 0.44–1.00)
GFR calc non Af Amer: 53 mL/min — ABNORMAL LOW (ref >60)
Glucose, Bld: 122 mg/dL — ABNORMAL HIGH (ref 65–99)
POTASSIUM: 4.3 mmol/L (ref 3.5–5.1)
Sodium: 146 mmol/L — ABNORMAL HIGH (ref 135–145)

## 2016-03-04 NOTE — Progress Notes (Signed)
PULMONARY / CRITICAL CARE MEDICINE   Name: [name] [name] MRN: [id number] DOB: 1970-10-26    ADMISSION DATE:  02/22/2016 CONSULTATION DATE:  02/22/16  REFERRING MD:  Baxter Hire Ward MD  CHIEF COMPLAINT:  Acute respiratory failure, Multilobar PNA  HISTORY OF PRESENT ILLNESS:   [name] [name] is a 46 year old NHR with past medical history of frontotemporal dementia, restless leg syndrome. . Admitted w septic shock, acute respiratory failure and R multilobar PNA.   Regarding her neurologic issues. 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 and has been evaluated by [facility] neurology in 2015. This was felt to be more consistent with frontotemporal dementia. Given the progressive nature of this disease palliation was advised. 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 The case was discussed with palliative care team at [facility] during her last admission in May 2017. Outpatient palliative care follow-up was recommended. However she remains full code.   SUBJECTIVE:  Lots of secretions Intermittently tolerating some high PSV  VITAL SIGNS: BP 106/76   Pulse (!) 109   Temp 99.7 F (37.6 C)   Resp 13   Ht 5\' 5"  (1.651 m)   Wt 60.1 kg (132 lb 7.9 oz)   SpO2 99%   BMI 22.05 kg/m   HEMODYNAMICS:    VENTILATOR SETTINGS: Vent Mode: PRVC FiO2 (%):  [40 %] 40 % Set Rate:  [16 bmp] 16 bmp Vt Set:  [400 mL] 400 mL PEEP:  [5 cmH20] 5 cmH20 Plateau Pressure:  [22 cmH20-27 cmH20] 27 cmH20  INTAKE / OUTPUT: I/O last 3 completed shifts: In: 3870 [I.V.:330; Other:160; NG/GT:3380] Out: 3025 [Urine:3025]  PHYSICAL EXAMINATION: General:  Chr ill , intubated Neuro: eyes open, tracks,does not follow commands , no obvious pain HEENT:  No thyromegaly, JVD Cardiovascular:  RRR, no MRG Lungs:  Bl scattered crackles. No wheeze Abdomen:  Distended, + BS Musculoskeletal:  Contracted arms BL Skin:  Intact  LABS:  BMET  Recent Labs Lab 03/02/16 0441 03/03/16 0415 03/04/16 0500  NA  143 145 146*  K 3.3* 3.1* 4.3  CL 107 110 107  CO2 25 26 27   BUN 27* 28* 34*  CREATININE 1.16* 1.13* 1.21*  GLUCOSE 117* 131* 122*    Electrolytes  Recent Labs Lab 02/29/16 0457 03/01/16 0430 03/02/16 0441 03/03/16 0415 03/04/16 0500  CALCIUM 9.0 9.0 9.0 8.9 9.7  MG 2.1 2.0 1.9  --   --   PHOS 3.7 3.4 3.4  --   --     CBC  Recent Labs Lab 02/29/16 0457 03/01/16 0430 03/02/16 0441  WBC 18.3* 20.9* 19.9*  HGB 9.5* 9.8* 9.5*  HCT 30.5* 31.7* 30.4*  PLT 305 335 338    Coag's No results for input(s): APTT, INR in the last 168 hours.  Sepsis Markers No results for input(s): LATICACIDVEN, PROCALCITON, O2SATVEN in the last 168 hours.  ABG No results for input(s): PHART, PCO2ART, PO2ART in the last 168 hours.  Liver Enzymes No results for input(s): AST, ALT, ALKPHOS, BILITOT, ALBUMIN in the last 168 hours.  Cardiac Enzymes No results for input(s): TROPONINI, PROBNP in the last 168 hours.  Glucose  Recent Labs Lab 03/03/16 1132 03/03/16 1606 03/03/16 2004 03/04/16 0009 03/04/16 0422 03/04/16 0809  GLUCAP 135* 114* 127* 141* 128* 119*    Imaging Dg Chest Port 1 View  Result Date: 03/04/2016 CLINICAL DATA:  Acute respiratory failure. EXAM: PORTABLE CHEST 1 VIEW COMPARISON:  03/01/2016 FINDINGS: Endotracheal tube is in place with tip 4.1 cm above the carina.  Left IJ central line tip to the superior vena cava. There is elevation of left hemidiaphragm. There is increasing opacity within the left lower lobe. No pulmonary edema. Right lung remains clear. IMPRESSION: Increasing left lower lobe infiltrate. Stable elevation of left hemidiaphragm. Electronically Signed   By: Norva PavlovElizabeth  Brown M.D.   On: 03/04/2016 07:25   STUDIES:  CT chest 12/21 > Complete right upper lobe consolidation and significant volume loss with occlusion of the right upper lobe bronchus by low-attenuation material, favoring mucous plugging. No discrete obstructing mass. 3. Moderate patchy  consolidation and ground-glass attenuation throughout the right middle lobe, lingula and bilateral lower lobes, favoring multilobar pneumonia and/or aspiration 12/21 bscopy >> mucus plugs Rt  CULTURES: Bcx 12/21 >> coag neg staph 1/2 Sputum Cx 12/21 > pseudomonas pan-S, MRSA Stool norovirus + Stool c diff Ag positive, toxin negative  RVP >> negative  Urine strep ag neg Urine legionella neg  ANTIBIOTICS: Vanco 12/21 >12/24 Zosyn 12/21 > 12/25 Azithro 12/21 >12/23 cipro 12/25 >> 12/30  SIGNIFICANT EVENTS: 12/21 > Admit with resp failure.   LINES/TUBES:  DISCUSSION: 46 year old with progressive frontotemporal dementia. Admitted with acute respiratory failure, multilobar GNR pneumonia/ RUL atelectasis & diarrhea due to norovirus. Failing SBT's  Palliative Care discussions ongoing: Family will want trach + PEG  ASSESSMENT / PLAN:  PULMONARY A: Acute hypercapnic, hypoxic respiratory failure Multilobar pneumonia/ RUL atx P:   Continue vent support, continue attempts at SBT, has not and does not meet extubation criteria due to high pressure needs and secretions.  Ongoing palliative discussion, see notes from 12/27 and 28. The family remains optimistic and would want trach / PEG, SNF placement. Will ned to arrange this week  CARDIOVASCULAR A:  Sinus Tachycardia. Likely from sepsis P:  Telemetry   RENAL A:   Hypokalemia/ hypophos/ hypomag Hypernatremia, improved Acute renal insufficiency, improved Total body volume overload P:   Monitor urine output and Cr Replete lytes as needed Continue free water Started diuresis 12/31  GASTROINTESTINAL A:   Diarrhea- norovirus; C diff positive, toxin negative  P:   Enteric precautions ct TFs abx as below  HEMATOLOGIC A:   Anemia , chronic illness P:  Follow CBC  INFECTIOUS A:   Multilobar PNA -pseudomonas, MRSA 20k only hence not treated C diff colitis, ? True pathogen given toxin negative. Have committed to treating  14 days.  Leucocytosis, fever P:   Continue PO vanco for 14 days total, 1/1 = day 12/14 Cipro per pharmacy, completed on 12/30 If decompensates consider restart IV vanc for MRSA  ENDOCRINE A:   Elevated BS P:   SSI coverage  NEUROLOGIC A:   Frontotemporal dementia P:   Palliation recommended by [facility] given the progressive nature of the disease. Appreciate palliative care input here. Discussions ongoing, but currently goals appear to be continued aggressive support including possible trach.    FAMILY  - Updates: PC has led discussions with HCPOA 12/27 - 29. Current plan would be continued aggressive care, move to trach this week - Inter-disciplinary family meet or Palliative Care meeting due by:  As above   Independent CC time 31 minutes  Levy Pupaobert Dwaine Pringle, MD, PhD 03/04/2016, 10:29 AM [place] [facility] [phone] or if no answer [phone]

## 2016-03-05 LAB — GLUCOSE, CAPILLARY
GLUCOSE-CAPILLARY: 134 mg/dL — AB (ref 65–99)
GLUCOSE-CAPILLARY: 126 mg/dL — AB (ref 65–99)
GLUCOSE-CAPILLARY: 123 mg/dL — AB (ref 65–99)
Glucose-Capillary: 123 mg/dL — ABNORMAL HIGH (ref 65–99)
Glucose-Capillary: 123 mg/dL — ABNORMAL HIGH (ref 65–99)

## 2016-03-05 LAB — URINALYSIS, ROUTINE W REFLEX MICROSCOPIC
Bilirubin Urine: NEGATIVE
Glucose, UA: NEGATIVE mg/dL
Ketones, ur: NEGATIVE mg/dL
Nitrite: NEGATIVE
Protein, ur: 100 mg/dL — AB
SPECIFIC GRAVITY, URINE: 1.019 (ref 1.005–1.030)
pH: 5 (ref 5.0–8.0)

## 2016-03-05 LAB — BASIC METABOLIC PANEL
Anion gap: 14 (ref 5–15)
BUN: 40 mg/dL — AB (ref 6–20)
CHLORIDE: 103 mmol/L (ref 101–111)
CO2: 26 mmol/L (ref 22–32)
CREATININE: 1.36 mg/dL — AB (ref 0.44–1.00)
Calcium: 9.2 mg/dL (ref 8.9–10.3)
GFR calc Af Amer: 53 mL/min — ABNORMAL LOW (ref >60)
GFR calc non Af Amer: 46 mL/min — ABNORMAL LOW (ref >60)
GLUCOSE: 137 mg/dL — AB (ref 65–99)
Potassium: 3.3 mmol/L — ABNORMAL LOW (ref 3.5–5.1)
Sodium: 143 mmol/L (ref 135–145)

## 2016-03-05 MED ORDER — POTASSIUM CHLORIDE 20 MEQ/15ML (10%) PO SOLN
40.0000 meq | Freq: Once | ORAL | Status: AC
  Administered 2016-03-05: 40 meq
  Filled 2016-03-05: qty 30

## 2016-03-05 MED ORDER — FUROSEMIDE 10 MG/ML IJ SOLN
40.0000 mg | Freq: Every day | INTRAMUSCULAR | Status: DC
  Administered 2016-03-06 – 2016-03-11 (×5): 40 mg via INTRAVENOUS
  Filled 2016-03-05 (×6): qty 4

## 2016-03-05 NOTE — Care Management Note (Addendum)
Case Management Note  Patient Details  Name: [name] [name] MRN: [id number] Date of Birth: 12/30/1970  Subjective/Objective: Pt admitted on 02/22/16 with acute respiratory failure, multilobar PNA.  PTA, pt resided at [facility].  Pt's POA is her sister, [name] [name].                     Action/Plan: Pt remains on ventilator, and is not weaning.  Per MD, pt will not be able to wean from ventilator, and sister is considering tracheostomy.  Palliative Medicine Team to meet with sister on 02/28/16 at 2pm for Goals of Care meeting.  Will consult CSW to facilitate return to SNF/possible vent SNF, should sister decide that she wants trach.    Expected Discharge Date:                  Expected Discharge Plan:  Skilled Nursing Facility  In-House Referral:  Clinical Social Work  Discharge planning Services  CM Consult  Post Acute Care Choice:    Choice offered to:     DME Arranged:    DME Agency:     HH Arranged:    [place] Agency:     Status of Service:  In process, will continue to follow  If discussed at Long Length of Stay Meetings, dates discussed:    Additional Comments: 03/05/2016  Discussed in LOS 03/05/16 - pt remains appropriate for continued stay  (continous mechanical ventilation) Palliative eval completed - family wishes to move forward with trach and peg with the understanding that SNF [place] the state.   03/01/16 [name], RN, BSN [phone] Palliative met with pt family; plan on moving forward with trach.  Family made aware that pt will likely be [place] out of state at discharge.  CM will continue to follow for discharge needs  [name], RN 03/05/2016, 2:26 PM

## 2016-03-05 NOTE — Progress Notes (Signed)
UTA pt severely contracted and unable to follow commands

## 2016-03-05 NOTE — Progress Notes (Signed)
PULMONARY / CRITICAL CARE MEDICINE   Name: [name] [name] MRN: [id number] DOB: 06/16/1970    ADMISSION DATE:  02/22/2016 CONSULTATION DATE:  02/22/16  REFERRING MD:  Baxter HireKristen Ward MD  CHIEF COMPLAINT:  Acute respiratory failure, Multilobar PNA  HISTORY OF PRESENT ILLNESS:   [name] [name] is a 46 year old NHR with past medical history of frontotemporal dementia, restless leg syndrome. . Admitted w septic shock, acute respiratory failure and R multilobar PNA.   Regarding her neurologic issues. 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 and has been [facility]. This was felt to be more consistent with frontotemporal dementia. Given the progressive nature of this disease palliation was advised. 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 The case was discussed with palliative care team at [facility] during her last admission in May 2017. Outpatient palliative care follow-up was recommended. However she remains full code.   SUBJECTIVE:  No clinical changes   VITAL SIGNS: BP 103/79 (BP Location: Left Arm)   Pulse (!) 120   Temp (!) 101.1 F (38.4 C)   Resp 16   Ht 5\' 5"  (1.651 m)   Wt 60.7 kg (133 lb 13.1 oz)   SpO2 100%   BMI 22.27 kg/m   HEMODYNAMICS:    VENTILATOR SETTINGS: Vent Mode: PRVC FiO2 (%):  [40 %] 40 % Set Rate:  [16 bmp] 16 bmp Vt Set:  [400 mL] 400 mL PEEP:  [5 cmH20] 5 cmH20 Plateau Pressure:  [10 cmH20-22 cmH20] 17 cmH20  INTAKE / OUTPUT: I/O last 3 completed shifts: In: 3970 [I.V.:370; Other:220; NG/GT:3380] Out: 2870 [Urine:2820; Stool:50]  PHYSICAL EXAMINATION: General:  Chr ill , intubated Neuro: eyes open, tracks,does not follow commands , no obvious pain HEENT:  No thyromegaly, JVD Cardiovascular:  RRR, no MRG Lungs:  Bl scattered crackles. No wheeze Abdomen:  Distended, + BS Musculoskeletal:  Contracted arms BL Skin:  Intact  LABS:  BMET  Recent Labs Lab 03/03/16 0415 03/04/16 0500 03/05/16 0430   NA 145 146* 143  K 3.1* 4.3 3.3*  CL 110 107 103  CO2 26 27 26   BUN 28* 34* 40*  CREATININE 1.13* 1.21* 1.36*  GLUCOSE 131* 122* 137*    Electrolytes  Recent Labs Lab 02/29/16 0457 03/01/16 0430 03/02/16 0441 03/03/16 0415 03/04/16 0500 03/05/16 0430  CALCIUM 9.0 9.0 9.0 8.9 9.7 9.2  MG 2.1 2.0 1.9  --   --   --   PHOS 3.7 3.4 3.4  --   --   --     CBC  Recent Labs Lab 02/29/16 0457 03/01/16 0430 03/02/16 0441  WBC 18.3* 20.9* 19.9*  HGB 9.5* 9.8* 9.5*  HCT 30.5* 31.7* 30.4*  PLT 305 335 338    Coag's No results for input(s): APTT, INR in the last 168 hours.  Sepsis Markers No results for input(s): LATICACIDVEN, PROCALCITON, O2SATVEN in the last 168 hours.  ABG No results for input(s): PHART, PCO2ART, PO2ART in the last 168 hours.  Liver Enzymes No results for input(s): AST, ALT, ALKPHOS, BILITOT, ALBUMIN in the last 168 hours.  Cardiac Enzymes No results for input(s): TROPONINI, PROBNP in the last 168 hours.  Glucose  Recent Labs Lab 03/04/16 1136 03/04/16 1540 03/04/16 2022 03/05/16 0348 03/05/16 0756 03/05/16 1137  GLUCAP 129* 134* 127* 123* 123* 134*    Imaging No results found. STUDIES:  CT chest 12/21 > Complete right upper lobe consolidation and significant volume loss with occlusion of the right upper lobe  bronchus by low-attenuation material, favoring mucous plugging. No discrete obstructing mass. 3. Moderate patchy consolidation and ground-glass attenuation throughout the right middle lobe, lingula and bilateral lower lobes, favoring multilobar pneumonia and/or aspiration 12/21 bscopy >> mucus plugs Rt  CULTURES: Bcx 12/21 >> coag neg staph 1/2 Sputum Cx 12/21 > pseudomonas pan-S, MRSA Stool norovirus + Stool c diff Ag positive, toxin negative  RVP >> negative  Urine strep ag neg Urine legionella neg  ANTIBIOTICS: Vanco 12/21 >12/24 Zosyn 12/21 > 12/25 Azithro 12/21 >12/23 cipro 12/25 >> 12/30  SIGNIFICANT  EVENTS: 12/21 > Admit with resp failure.   LINES/TUBES:  DISCUSSION: 46 year old with progressive frontotemporal dementia. Admitted with acute respiratory failure, multilobar GNR pneumonia/ RUL atelectasis & diarrhea due to norovirus. Failing SBT's  Palliative Care discussions ongoing: Family will want trach + PEG  ASSESSMENT / PLAN:  PULMONARY A: Acute hypercapnic, hypoxic respiratory failure Multilobar pneumonia/ RUL atx P:   Continue vent support, continue attempts at SBT, has not and does not meet extubation criteria due to high pressure needs and secretions.  Ongoing palliative discussion, see notes from 12/27 and 28. The family remains optimistic and would want trach / PEG, SNF placement. Will ned to arrange this week  CARDIOVASCULAR A:  Sinus Tachycardia. Likely from sepsis P:  Telemetry   RENAL A:   Hypokalemia/ hypophos/ hypomag Hypernatremia, improved Acute renal insufficiency, improved Total body volume overload P:   Monitor urine output and Cr Replete lytes as needed Continue free water Started diuresis 12/31, decrease on 1/2  GASTROINTESTINAL A:   Diarrhea- norovirus; C diff positive, toxin negative  P:   Enteric precautions ct TFs abx as below  HEMATOLOGIC A:   Anemia , chronic illness P:  Follow CBC  INFECTIOUS A:   Multilobar PNA -pseudomonas, MRSA 20k only hence not treated C diff colitis, ? True pathogen given toxin negative. Have committed to treating 14 days.  Leucocytosis, fever P:   Continue PO vanco for 14 days total, 1/2 = day 13/14 Cipro per pharmacy, completed on 12/30 If decompensates consider restart IV vanc for MRSA  ENDOCRINE A:   Elevated BS P:   SSI coverage  NEUROLOGIC A:   Frontotemporal dementia P:   Palliation recommended by [facility] given the progressive nature of the disease. Appreciate palliative care input here. Discussions ongoing, but currently goals appear to be continued aggressive support including  possible trach.    FAMILY  - Updates: PC has led discussions with HCPOA 12/27 - 29. Current plan would be continued aggressive care, move to trach this week - Inter-disciplinary family meet or Palliative Care meeting due by:  As above   Independent CC time 31 minutes  Levy Pupa, MD, PhD 03/05/2016, 1:56 PM [facility] [phone] or if no answer [phone]

## 2016-03-05 NOTE — Progress Notes (Signed)
eLink Physician-Brief Progress Note Patient Name: [name] SchmidtChevella Ode DOB: 05/28/1970 MRN: [id number]   Date of Service  03/05/2016  HPI/Events of Note  fever  eICU Interventions  tylenol is ordered for fever Then follow mild tachy related  bc send UA      Intervention Category Intermediate Interventions: Infection - evaluation and management  Nelda BucksFEINSTEIN,Sharita Bienaime J. 03/05/2016, 4:43 PM

## 2016-03-05 NOTE — Progress Notes (Signed)
eLink Physician-Brief Progress Note Patient Name: [name] SchmidtChevella [name] DOB: 01/01/1971 MRN: [id number]   Date of Service  03/05/2016  HPI/Events of Note  K+ = 3.3 and Creatinine = 1.36.  eICU Interventions  Will replace K+.     Intervention Category Intermediate Interventions: Electrolyte abnormality - evaluation and management  Sommer,Steven Eugene 03/05/2016, 5:37 AM

## 2016-03-06 DIAGNOSIS — F0390 Unspecified dementia without behavioral disturbance: Secondary | ICD-10-CM

## 2016-03-06 DIAGNOSIS — F0281 Dementia in other diseases classified elsewhere with behavioral disturbance: Secondary | ICD-10-CM

## 2016-03-06 DIAGNOSIS — G3109 Other frontotemporal dementia: Secondary | ICD-10-CM

## 2016-03-06 LAB — CBC
HEMATOCRIT: 33 % — AB (ref 36.0–46.0)
HEMOGLOBIN: 10.6 g/dL — AB (ref 12.0–15.0)
MCH: 26.6 pg (ref 26.0–34.0)
MCHC: 32.1 g/dL (ref 30.0–36.0)
MCV: 82.9 fL (ref 78.0–100.0)
Platelets: 435 10*3/uL — ABNORMAL HIGH (ref 150–400)
RBC: 3.98 MIL/uL (ref 3.87–5.11)
RDW: 16.8 % — ABNORMAL HIGH (ref 11.5–15.5)
WBC: 22.9 10*3/uL — AB (ref 4.0–10.5)

## 2016-03-06 LAB — BASIC METABOLIC PANEL
ANION GAP: 12 (ref 5–15)
BUN: 48 mg/dL — ABNORMAL HIGH (ref 6–20)
CHLORIDE: 108 mmol/L (ref 101–111)
CO2: 25 mmol/L (ref 22–32)
Calcium: 9.3 mg/dL (ref 8.9–10.3)
Creatinine, Ser: 1.26 mg/dL — ABNORMAL HIGH (ref 0.44–1.00)
GFR, EST AFRICAN AMERICAN: 58 mL/min — AB (ref >60)
GFR, EST NON AFRICAN AMERICAN: 50 mL/min — AB (ref >60)
Glucose, Bld: 132 mg/dL — ABNORMAL HIGH (ref 65–99)
POTASSIUM: 3.4 mmol/L — AB (ref 3.5–5.1)
SODIUM: 145 mmol/L (ref 135–145)

## 2016-03-06 LAB — GLUCOSE, CAPILLARY
GLUCOSE-CAPILLARY: 115 mg/dL — AB (ref 65–99)
GLUCOSE-CAPILLARY: 117 mg/dL — AB (ref 65–99)
GLUCOSE-CAPILLARY: 121 mg/dL — AB (ref 65–99)
GLUCOSE-CAPILLARY: 134 mg/dL — AB (ref 65–99)
Glucose-Capillary: 126 mg/dL — ABNORMAL HIGH (ref 65–99)
Glucose-Capillary: 131 mg/dL — ABNORMAL HIGH (ref 65–99)
Glucose-Capillary: 138 mg/dL — ABNORMAL HIGH (ref 65–99)

## 2016-03-06 LAB — PROTIME-INR
INR: 1.13
Prothrombin Time: 14.6 seconds (ref 11.4–15.2)

## 2016-03-06 LAB — APTT: APTT: 31 s (ref 24–36)

## 2016-03-06 MED ORDER — CEFTRIAXONE SODIUM 1 G IJ SOLR
1.0000 g | INTRAMUSCULAR | Status: DC
  Administered 2016-03-06 – 2016-03-07 (×2): 1 g via INTRAVENOUS
  Filled 2016-03-06 (×3): qty 10

## 2016-03-06 MED ORDER — POTASSIUM CHLORIDE 20 MEQ/15ML (10%) PO SOLN
40.0000 meq | Freq: Every day | ORAL | Status: DC
  Administered 2016-03-06 – 2016-03-08 (×2): 40 meq
  Filled 2016-03-06 (×3): qty 30

## 2016-03-06 MED ORDER — ETOMIDATE 2 MG/ML IV SOLN
40.0000 mg | Freq: Once | INTRAVENOUS | Status: AC
  Administered 2016-03-07: 35 mg via INTRAVENOUS
  Filled 2016-03-06: qty 20

## 2016-03-06 MED ORDER — MIDAZOLAM HCL 2 MG/2ML IJ SOLN
4.0000 mg | Freq: Once | INTRAMUSCULAR | Status: AC
  Administered 2016-03-07: 2 mg via INTRAVENOUS
  Filled 2016-03-06: qty 4

## 2016-03-06 MED ORDER — PROPOFOL 500 MG/50ML IV EMUL: 5.0000 ug/kg/min | Freq: Once | INTRAVENOUS | Status: DC

## 2016-03-06 MED ORDER — FENTANYL CITRATE (PF) 100 MCG/2ML IJ SOLN
200.0000 ug | Freq: Once | INTRAMUSCULAR | Status: AC
  Administered 2016-03-07: 200 ug via INTRAVENOUS
  Filled 2016-03-06: qty 4

## 2016-03-06 MED ORDER — POTASSIUM CHLORIDE 20 MEQ/15ML (10%) PO SOLN
40.0000 meq | Freq: Once | ORAL | Status: AC
  Administered 2016-03-06: 40 meq
  Filled 2016-03-06: qty 30

## 2016-03-06 MED ORDER — VECURONIUM BROMIDE 10 MG IV SOLR
10.0000 mg | Freq: Once | INTRAVENOUS | Status: AC
  Administered 2016-03-07: 10 mg via INTRAVENOUS

## 2016-03-06 NOTE — Progress Notes (Signed)
eLink Physician-Brief Progress Note Patient Name: [name] SchmidtChevella [name] DOB: 06/28/1970 MRN: [id number]   Date of Service  03/06/2016  HPI/Events of Note  Spoke tyo [name] Sister Med poa All risks dicussed for trach infection, death , bleeding, PTX accepted risk Attempted to again review quality of life and utility - wanted to proceed Told her at any point in time can consider comfort care  eICU Interventions       Intervention Category Intermediate Interventions: Respiratory distress - evaluation and management;Communication with other healthcare providers and/or family  Nelda BucksFEINSTEIN,DANIEL J. 03/06/2016, 7:54 PM

## 2016-03-06 NOTE — Progress Notes (Signed)
PULMONARY / CRITICAL CARE MEDICINE   Name: [name] [name] MRN: [id number] DOB: November 30, 1970    ADMISSION DATE:  02/22/2016 CONSULTATION DATE:  02/22/16  REFERRING MD:  Baxter Hire Ward MD  CHIEF COMPLAINT:  Acute respiratory failure, Multilobar PNA  HISTORY OF PRESENT ILLNESS:   [name] [name] is a 46 year old with PMHx of frontotemporal dementia, restless leg syndrome who was admitted with septic shock, acute respiratory failure and R multilobar PNA who remains intubated as she continues to fail SBTs.   Regarding her neurologic issues. 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 and has been evaluated by [facility] in 2015. This was felt to be more consistent with frontotemporal dementia. Given the progressive nature of this disease palliation was advised. 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 The case was discussed with palliative care team at [facility] during her last admission in May 2017. Outpatient palliative care follow-up was recommended. However she remains full code.  SUBJECTIVE:  Overnight, patient continued to be febrile up to 101.1. BCx and UA were collected. Patient continues to fail SBT trials given long periods of apnea around 30 seconds followed by tachypnea.   VITAL SIGNS: BP 111/81   Pulse (!) 110   Temp 99.9 F (37.7 C)   Resp 19   Ht 5\' 5"  (1.651 m)   Wt 135 lb 2.3 oz (61.3 kg)   SpO2 99%   BMI 22.49 kg/m   HEMODYNAMICS:    VENTILATOR SETTINGS: Vent Mode: PRVC FiO2 (%):  [40 %] 40 % Set Rate:  [16 bmp] 16 bmp Vt Set:  [400 mL] 400 mL PEEP:  [5 cmH20] 5 cmH20 Plateau Pressure:  [16 cmH20-23 cmH20] 21 cmH20  INTAKE / OUTPUT: I/O last 3 completed shifts: In: 3403 [P.O.:3; I.V.:360; Other:60; NG/GT:2980] Out: 2230 [Urine:2230]  PHYSICAL EXAMINATION:  General:  Chronically ill appearing , intubated Neuro: Does not follow command, appears comfortable HEENT: PEERL Cardiovascular:  Tachycardic, regular rhythm, no MRG Lungs: Mild  inspiratory crackles bilaterally, no wheezing or rhonchi.  Abdomen:  NTND, + BS Musculoskeletal:  Contracted upper extremities bilaterally  LABS:  BMET  Recent Labs Lab 03/04/16 0500 03/05/16 0430 03/06/16 0400  NA 146* 143 145  K 4.3 3.3* 3.4*  CL 107 103 108  CO2 27 26 25   BUN 34* 40* 48*  CREATININE 1.21* 1.36* 1.26*  GLUCOSE 122* 137* 132*    Electrolytes  Recent Labs Lab 02/29/16 0457 03/01/16 0430 03/02/16 0441  03/04/16 0500 03/05/16 0430 03/06/16 0400  CALCIUM 9.0 9.0 9.0  < > 9.7 9.2 9.3  MG 2.1 2.0 1.9  --   --   --   --   PHOS 3.7 3.4 3.4  --   --   --   --   < > = values in this interval not displayed.  CBC  Recent Labs Lab 03/01/16 0430 03/02/16 0441 03/06/16 0400  WBC 20.9* 19.9* 22.9*  HGB 9.8* 9.5* 10.6*  HCT 31.7* 30.4* 33.0*  PLT 335 338 435*    Coag's  Recent Labs Lab 03/06/16 0400  APTT 31  INR 1.13    Sepsis Markers No results for input(s): LATICACIDVEN, PROCALCITON, O2SATVEN in the last 168 hours.  ABG No results for input(s): PHART, PCO2ART, PO2ART in the last 168 hours.  Liver Enzymes No results for input(s): AST, ALT, ALKPHOS, BILITOT, ALBUMIN in the last 168 hours.  Cardiac Enzymes No results for input(s): TROPONINI, PROBNP in the last 168 hours.  Glucose  Recent Labs Lab 03/05/16  1137 03/05/16 1528 03/05/16 1952 03/06/16 0006 03/06/16 0357 03/06/16 0739  GLUCAP 134* 126* 123* 121* 115* 138*    Imaging No results found.   STUDIES:  CT chest 12/21 > Complete right upper lobe consolidation and significant volume loss with occlusion of the right upper lobe bronchus by low-attenuation material, favoring mucous plugging. No discrete obstructing mass. 3. Moderate patchy consolidation and ground-glass attenuation throughout the right middle lobe, lingula and bilateral lower lobes, favoring multilobar pneumonia and/or aspiration 12/21 bronchoscopy >> mucus plugs Rt  CULTURES: Bcx 12/21 >> coag neg staph  1/2 Sputum Cx 12/21 > pseudomonas pan-S, MRSA Stool norovirus + Stool c diff Ag positive, toxin negative  RVP >> negative  Urine strep ag neg Urine legionella neg BCx 1/2 >> pending  ANTIBIOTICS: Zosyn 12/21 >> 12/25 Azithro 12/21 >> 12/23 Cipro 12/25 >> 12/30 Vanco 12/21 >>  SIGNIFICANT EVENTS: 12/21 > Admitted with respiratory failure  LINES/TUBES: ETT 12/21>>  DISCUSSION: [name] [name] is a 46 year old NHR with PMHx of frontotemporal dementia, restless leg syndrome who was admitted with septic shock, acute respiratory failure and R multilobar PNA who remains intubated as she continues to fail SBTs. Palliative Care discussions ongoing: Family will want trach + PEG.  ASSESSMENT / PLAN:  PULMONARY A: Acute hypercapnic, hypoxic respiratory failure Multilobar pneumonia/ RUL atx P:   -Continue vent support, continue attempts at SBT, has not and does not meet extubation criteria due to high pressure needs and secretions, long periods of apnea followed by tachycardia and tachypnea -Ongoing palliative discussion, see notes from 12/27 and 12/28. The family remains optimistic and would want trach / PEG, SNF placement. This planned for 1/4 -Albuterol Q2H prn   CARDIOVASCULAR A:  Sinus Tachycardia P:  Telemetry   RENAL A:   Hypokalemia/ hypophos/ hypomag Hypernatremia, improved to 145 AKI Total body volume overload P:   -Monitor urine output (out 1,500 mL of urine yesterday) and Cr (1.26 today) -Replete lytes as needed -Continue free water 200 mL Q6H -Lasix 40 mg QD  GASTROINTESTINAL A:   Diarrhea- norovirus; C diff positive, toxin negative  P:   -Enteric precautions -Continue TF -Vancomycin 125 mg QID for 14 days for C Diff Diarrhea (12/21 >>) -Pepcid 20 mg BID -Zofran 4 mg Q6H prn nausea  HEMATOLOGIC A:   Chronic Anemia P:  -Follow CBC -Lovenox 40 mg SQ QD  INFECTIOUS A:   Multilobar PNA -pseudomonas, MRSA 20k only hence not treated C diff colitis, ? True  pathogen given toxin negative. Have committed to treating 14 days.  Leukocytosis, continued fever- increased to 22 today and febrile to 101.1 UA concerning for infection P:   -Continue PO vanco for 14 days total -Cipro per pharmacy, completed on 12/30 -If decompensates consider restart IV vanc for MRSA -Tylenol prn -BCx 1/2 >> pending -UCx 1/3 >> pending  ENDOCRINE A:   Elevated Glucose P:   -SSI coverage  NEUROLOGIC A:   Frontotemporal dementia P:   -Palliation recommended by [facility] given the progressive nature of the disease. -Appreciate palliative care input here. Discussions ongoing, but currently goals appear to be continued aggressive support including possible trach.   DVT/PE ppx: Lovenox SQ QD FEN: TF, Free water 200 mL Q6H  FAMILY  - Updates: PC has led discussions with HCPOA 12/27 - 29. Current plan would be continued aggressive care, move to trach this week - Inter-disciplinary family meet or Palliative Care meeting due by:  As above  Karlene Lineman, DO PGY-3 Internal Medicine Resident Pager #  [phone] 03/06/2016 8:29 AM  Attending Note:  I have examined patient, reviewed labs, studies and notes. I have discussed the case with Dr Lawerance BachBurns, and I agree with the data and plans as amended above. 46 yo chronically debilitated woman with temperofrontal dementia, unfortunately intubated for multifocal PNA and septic shock. Course has been c/b C diff colitis (treating) and failure to progress with vent weaning. On my eval she is profoundly weak, will track and follow some commands. Lungs are distant with rhonchi. She is unable to tolerate PSV, even high PS. Goals of care have been discussed with her sister. Family favors continued aggressive care including trach. Dr Tyson AliasFeinstein planning to perform tomorrow. She has low grade fever and leukocytosis. We will treat (again) for UTI, plan to change foley 1/4 after 24h abx.  She will [place]. Independent critical  care time is 32 minutes.   Levy Pupaobert Hilman Kissling, MD, PhD 03/06/2016, 11:55 AM  Pulmonary and Critical Care [phone] or if no answer [phone]

## 2016-03-06 NOTE — Progress Notes (Signed)
Again attempted to contact sister at both numbers and was unsuccessful.  Karlene LinemanAlexa Burns, DO PGY-3 Internal Medicine Resident Pager # [phone] 03/06/2016 4:03 PM

## 2016-03-06 NOTE — Progress Notes (Signed)
Palliative Medicine Progress Note  Noted patient continues with intubation. Failing SBT's with periods of apnea. Evaluated patient at bedside. She remains nonresponsive. Did not follow commands. She is not blinking. She appears to track for a matter of seconds then returns to an obtunded state. There are no meaningful movements. Noted she has been febrile with suspected UTI, cultures pending. Tracheostomy planned for 1/4 per chart review. Labs and VS reviewed.  I attempted to contact sister, [name] who is also legal guardian per her report (awaiting legal documents) to update on patient status. There was no answer at either of her numbers.   Palliative medicine will continue to shadow chart for needs. Please contact in the interim if further needs arise.   Greater than 50%  of this time was spent counseling and coordinating care related to the above assessment and plan.  Total time: 25 minutes  Ocie BobKasie Mahan, AGNP-C Palliative Medicine  Please call Palliative Medicine team phone with any questions [phone]. For individual providers please see AMION.

## 2016-03-06 NOTE — Progress Notes (Signed)
Attempted to call patient's sister at home and at work x 2 with no answer.  [name] LinemanAlexa Burns, DO PGY-3 Internal Medicine Resident Pager # [phone] 03/06/2016 1:11 PM

## 2016-03-07 ENCOUNTER — Inpatient Hospital Stay (HOSPITAL_COMMUNITY): Payer: Medicare Other

## 2016-03-07 ENCOUNTER — Encounter (HOSPITAL_COMMUNITY): Payer: Self-pay | Admitting: *Deleted

## 2016-03-07 DIAGNOSIS — Z978 Presence of other specified devices: Secondary | ICD-10-CM

## 2016-03-07 DIAGNOSIS — A419 Sepsis, unspecified organism: Secondary | ICD-10-CM

## 2016-03-07 LAB — BASIC METABOLIC PANEL
ANION GAP: 11 (ref 5–15)
BUN: 47 mg/dL — AB (ref 6–20)
CHLORIDE: 109 mmol/L (ref 101–111)
CO2: 26 mmol/L (ref 22–32)
Calcium: 9.4 mg/dL (ref 8.9–10.3)
Creatinine, Ser: 1.16 mg/dL — ABNORMAL HIGH (ref 0.44–1.00)
GFR calc Af Amer: >60 mL/min (ref >60)
GFR calc non Af Amer: 56 mL/min — ABNORMAL LOW (ref >60)
Glucose, Bld: 121 mg/dL — ABNORMAL HIGH (ref 65–99)
Potassium: 3.8 mmol/L (ref 3.5–5.1)
SODIUM: 146 mmol/L — AB (ref 135–145)

## 2016-03-07 LAB — GLUCOSE, CAPILLARY
GLUCOSE-CAPILLARY: 111 mg/dL — AB (ref 65–99)
GLUCOSE-CAPILLARY: 114 mg/dL — AB (ref 65–99)
GLUCOSE-CAPILLARY: 118 mg/dL — AB (ref 65–99)
GLUCOSE-CAPILLARY: 124 mg/dL — AB (ref 65–99)
GLUCOSE-CAPILLARY: 144 mg/dL — AB (ref 65–99)
Glucose-Capillary: 122 mg/dL — ABNORMAL HIGH (ref 65–99)

## 2016-03-07 LAB — CBC WITH DIFFERENTIAL/PLATELET
BASOS ABS: 0.1 10*3/uL (ref 0.0–0.1)
Basophils Relative: 0 %
EOS ABS: 0.2 10*3/uL (ref 0.0–0.7)
Eosinophils Relative: 1 %
HCT: 34.2 % — ABNORMAL LOW (ref 36.0–46.0)
HEMOGLOBIN: 10.8 g/dL — AB (ref 12.0–15.0)
LYMPHS ABS: 5 10*3/uL — AB (ref 0.7–4.0)
LYMPHS PCT: 22 %
MCH: 26.3 pg (ref 26.0–34.0)
MCHC: 31.6 g/dL (ref 30.0–36.0)
MCV: 83.2 fL (ref 78.0–100.0)
Monocytes Absolute: 1 10*3/uL (ref 0.1–1.0)
Monocytes Relative: 4 %
NEUTROS PCT: 73 %
Neutro Abs: 16.4 10*3/uL — ABNORMAL HIGH (ref 1.7–7.7)
Platelets: 475 10*3/uL — ABNORMAL HIGH (ref 150–400)
RBC: 4.11 MIL/uL (ref 3.87–5.11)
RDW: 17.1 % — ABNORMAL HIGH (ref 11.5–15.5)
WBC: 22.6 10*3/uL — AB (ref 4.0–10.5)

## 2016-03-07 MED ORDER — FENTANYL CITRATE (PF) 100 MCG/2ML IJ SOLN: 25.0000 ug | INTRAMUSCULAR | Status: AC | PRN

## 2016-03-07 MED ORDER — VANCOMYCIN 50 MG/ML ORAL SOLUTION
125.0000 mg | Freq: Four times a day (QID) | ORAL | Status: DC
  Administered 2016-03-07 – 2016-03-09 (×8): 125 mg via ORAL
  Filled 2016-03-07 (×10): qty 2.5

## 2016-03-07 MED ORDER — SODIUM CHLORIDE 0.9 % IV BOLUS (SEPSIS)
500.0000 mL | Freq: Once | INTRAVENOUS | Status: AC
  Administered 2016-03-07: 500 mL via INTRAVENOUS

## 2016-03-07 NOTE — Progress Notes (Signed)
PULMONARY / CRITICAL CARE MEDICINE   Name: [name] [name] MRN: [id number] DOB: 23-Oct-1970    ADMISSION DATE:  02/22/2016 CONSULTATION DATE:  02/22/16  REFERRING MD:  Baxter Hire Ward MD  CHIEF COMPLAINT:  Acute respiratory failure, Multilobar PNA  HISTORY OF PRESENT ILLNESS:   [name] [name] is a 46 year old with PMHx of frontotemporal dementia, restless leg syndrome who was admitted on 12/21 with septic shock, acute respiratory failure and R multilobar PNA who remains intubated as she continues to fail SBTs.   Regarding her neurologic issues. 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 and has been evaluated by [facility] neurology in 2015. This was felt to be more consistent with frontotemporal dementia. Given the progressive nature of this disease palliation was advised. 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 The case was discussed with palliative care team at [facility] during her last admission in May 2017. Outpatient palliative care follow-up was recommended. However she remains full code.  SUBJECTIVE:  No acute events overnight. I talked with patient's sister yesterday regarding updates and prognosis. Patient's sister continues to desire Trach placement for the patient. She understands this may require her to go to an LTAC out of state.   VITAL SIGNS: BP 92/72   Pulse (!) 113   Temp 99.3 F (37.4 C)   Resp 17   Ht 5\' 5"  (1.651 m)   Wt 130 lb 4.7 oz (59.1 kg)   SpO2 98%   BMI 21.68 kg/m   HEMODYNAMICS:    VENTILATOR SETTINGS: Vent Mode: PRVC FiO2 (%):  [40 %] 40 % Set Rate:  [16 bmp] 16 bmp Vt Set:  [400 mL] 400 mL PEEP:  [5 cmH20] 5 cmH20 Plateau Pressure:  [17 cmH20-21 cmH20] 20 cmH20  INTAKE / OUTPUT: I/O last 3 completed shifts: In: 3818 [P.O.:3; I.V.:350; Other:90; WU/JW:1191; IV Piggyback:50] Out: 2055 [Urine:1605; Stool:450]  PHYSICAL EXAMINATION:  General:  Chronically ill appearing , intubated Neuro: Does not follow commands, appears  comfortable HEENT: PEERL, does not track Cardiovascular:  Tachycardic, regular rhythm, no MRG Lungs: Mild inspiratory crackles bilaterally, no wheezing or rhonchi.  Abdomen:  NTND, + BS Musculoskeletal:  Contracted upper extremities bilaterally  LABS:  BMET  Recent Labs Lab 03/05/16 0430 03/06/16 0400 03/07/16 0345  NA 143 145 146*  K 3.3* 3.4* 3.8  CL 103 108 109  CO2 26 25 26   BUN 40* 48* 47*  CREATININE 1.36* 1.26* 1.16*  GLUCOSE 137* 132* 121*    Electrolytes  Recent Labs Lab 03/01/16 0430 03/02/16 0441  03/05/16 0430 03/06/16 0400 03/07/16 0345  CALCIUM 9.0 9.0  < > 9.2 9.3 9.4  MG 2.0 1.9  --   --   --   --   PHOS 3.4 3.4  --   --   --   --   < > = values in this interval not displayed.  CBC  Recent Labs Lab 03/02/16 0441 03/06/16 0400 03/07/16 0345  WBC 19.9* 22.9* 22.6*  HGB 9.5* 10.6* 10.8*  HCT 30.4* 33.0* 34.2*  PLT 338 435* 475*    Coag's  Recent Labs Lab 03/06/16 0400  APTT 31  INR 1.13    Sepsis Markers No results for input(s): LATICACIDVEN, PROCALCITON, O2SATVEN in the last 168 hours.  ABG No results for input(s): PHART, PCO2ART, PO2ART in the last 168 hours.  Liver Enzymes No results for input(s): AST, ALT, ALKPHOS, BILITOT, ALBUMIN in the last 168 hours.  Cardiac Enzymes No results for input(s): TROPONINI, PROBNP in the  last 168 hours.  Glucose  Recent Labs Lab 03/06/16 0739 03/06/16 1124 03/06/16 1542 03/06/16 1953 03/06/16 2336 03/07/16 0349  GLUCAP 138* 126* 117* 134* 131* 111*    Imaging Dg Chest Port 1 View  Result Date: 03/07/2016 CLINICAL DATA:  Intubated patient, acute respiratory failure, pneumonia EXAM: PORTABLE CHEST 1 VIEW COMPARISON:  Portable chest x-ray of March 04, 2016 FINDINGS: The lungs are borderline hypoinflated. The retrocardiac region on the left remains dense with obscuration of the mildly elevated hemidiaphragm. The heart is not enlarged. The pulmonary vascularity is not engorged. The  endotracheal tube tip lies 5.6 cm above the carina. The left internal jugular venous catheter tip projects over the midportion of the SVC. IMPRESSION: Persistent left lower lobe atelectasis or pneumonia somewhat improved since the previous study. Probable small left pleural effusion. The support devices are in stable position. Electronically Signed   By: David  [place] M.D.   On: 03/07/2016 06:49     STUDIES:  CT chest 12/21 > Complete right upper lobe consolidation and significant volume loss with occlusion of the right upper lobe bronchus by low-attenuation material, favoring mucous plugging. No discrete obstructing mass. 3. Moderate patchy consolidation and ground-glass attenuation throughout the right middle lobe, lingula and bilateral lower lobes, favoring multilobar pneumonia and/or aspiration 12/21 bronchoscopy >> mucus plugs Rt CXR 1/4 >> Persistent left lower lobe atelectasis or pneumonia somewhat improved since the previous study. Probable small left pleural effusion. The support devices are in stable position.  CULTURES: Bcx 12/21 >> coag neg staph 1/2 Sputum Cx 12/21 > pseudomonas pan-S, MRSA Stool norovirus + Stool c diff Ag positive, toxin negative  RVP >> negative  Urine strep ag neg Urine legionella neg BCx 1/2 >> NGTD  UCx >> pending  ANTIBIOTICS: Zosyn 12/21 >> 12/25 Azithro 12/21 >> 12/23 Cipro 12/25 >> 12/30 Vanco 12/21 >> to complete today Ceftriaxone 1/3 >>  SIGNIFICANT EVENTS: 12/21 > Admitted with respiratory failure  LINES/TUBES: ETT 12/21>> Left IJ >>>  DISCUSSION: [name] [name] is a 46 year old NHR with PMHx of frontotemporal dementia, restless leg syndrome who was admitted with septic shock, acute respiratory failure and R multilobar PNA who remains intubated as she continues to fail SBTs. Palliative Care discussions ongoing: Family will want trach + PEG.  ASSESSMENT / PLAN:  PULMONARY A: Acute hypercapnic, hypoxic respiratory failure Multilobar  pneumonia/ RUL atx P:   -Continue vent support, has not and does not meet extubation criteria due to high pressure needs and secretions, long periods of apnea followed by tachycardia and tachypnea -Family continues to desire trach / PEG, LTAC placement with understanding she may have to go out of state. -Albuterol Q2H prn  -weaning attempts, PS increase  CARDIOVASCULAR A:  Sinus Tachycardia P:  Telemetry   RENAL A:   Hypokalemia/ hypophos/ hypomag- improved Hypernatremia, 146 this morning AKI P:   -Monitor urine output (out 1,020 mL of urine yesterday) and Cr (1.16 today) -Continue free water 200 mL Q6H -Lasix 40 mg QD  GASTROINTESTINAL A:   Diarrhea- norovirus; C diff positive, toxin negative  P:   -Enteric precautions -Continue TF through peg after trach -Vancomycin 125 mg QID for 14 days for C Diff Diarrhea, extend as new abx started -Pepcid 20 mg BID -Zofran 4 mg Q6H prn nausea  HEMATOLOGIC A:   Chronic Anemia P:  -Follow CBC -Lovenox 40 mg SQ QD  INFECTIOUS A:   Multilobar PNA -pseudomonas, MRSA 20k only hence not treated C diff colitis, ? True pathogen given toxin  negative. Have committed to treating 14 days.  UTI: Leukocytosis stagnant at 22, fever curve trending down P:   -Continue PO vanco for 14 days total -Cipro completed on 12/30 -If decompensates consider restart IV vanc for MRSA -Tylenol prn -BCx 1/2 >> NGTD -UCx 1/3 >> pending  ENDOCRINE A:   Elevated Glucose P:   -SSI coverage  NEUROLOGIC A:   Frontotemporal dementia P:   -Palliation recommended by [facility] given the progressive nature of the disease. -Appreciate palliative care input here. Discussions ongoing, but currently goals appear to be continued aggressive support including possible trach.  -Family is requesting reimaging of brain for progression  DVT/PE ppx: Lovenox SQ QD FEN: TF, Free water 200 mL Q6H  FAMILY  - Updates: Discussed with patient's daughter on 1/3- continues to  want aggressive care, move to trach this week  Karlene LinemanAlexa Burns, DO PGY-3 Internal Medicine Resident Pager # [phone] 03/07/2016 7:21 AM  STAFF NOTE: Cindi CarbonI, Lucciano Vitali, MD FACP have personally reviewed patient's available data, including medical history, events of note, physical examination and test results as part of my evaluation. I have discussed with resident/NP and other care providers such as pharmacist, RN and RRT. In addition, I personally evaluated patient and elicited key findings of: no changes in  Neurostatus, remains poor outcome quality of life, weaning attempts today planned ps high if needed, trach planned, peg in place, TF after trach, even to slight pos balance goals, NA noted, lasix , new abx for uti started, will extend cdiff oral vanc 10 days after last day ceftriaxone, this appears to be futile and medically ineffective to have aggressive care, cpr, shock pressors etc, coags wnl The patient is critically ill with multiple organ systems failure and requires high complexity decision making for assessment and support, frequent evaluation and titration of therapies, application of advanced monitoring technologies and extensive interpretation of multiple databases.   Critical Care Time devoted to patient care services described in this note is 30 Minutes. This time reflects time of care of this signee: Rory Percyaniel Weston Kallman, MD FACP. This critical care time does not reflect procedure time, or teaching time or supervisory time of PA/NP/Med student/Med Resident etc but could involve care discussion time. Rest per NP/medical resident whose note is outlined above and that I agree with   Mcarthur Rossettianiel J. Tyson AliasFeinstein, MD, FACP Pgr: [phone] [facility] 03/07/2016 10:41 AM

## 2016-03-07 NOTE — Care Management Note (Signed)
Case Management Note  Patient Details  Name: [name] [name] MRN: [id number] Date of Birth: [birth year]  Subjective/Objective: Pt admitted on 02/22/16 with acute respiratory failure, multilobar PNA.  PTA, pt resided at [facility].  Pt's POA is her sister, Neta Upadhyay.                     Action/Plan: Pt remains on ventilator, and is not weaning.  Per MD, pt will not be able to wean from ventilator, and sister is considering tracheostomy.  Palliative Medicine Team to meet with sister on 02/28/16 at 2pm for Goals of Care meeting.  Will consult CSW to facilitate return to SNF/possible vent SNF, should sister decide that she wants trach.    Expected Discharge Date:                  Expected Discharge Plan:  Skilled Nursing Facility  In-House Referral:  Clinical Social Work  Discharge planning Services  CM Consult  Post Acute Care Choice:    Choice offered to:     DME Arranged:    DME Agency:     HH Arranged:    [place] Agency:     Status of Service:  In process, will continue to follow  If discussed at Long Length of Stay Meetings, dates discussed:    Additional Comments: 03/07/2016  CM spoke in depth with pts sister 03/06/16 regarding pt will most likely be placed in a SNF out of state at discharge due to pt not being appropriate for LTAC per physician advisor.  Sister relayed that she interested in sister discharging to vent SNF in [place] following up with sister regarding viable SNF options.  CM acknowledged request for facility in [place] but did reiterate to sister that the chances of pt discharging instate with vent would be unlikely - sister wishes to continue to move forward with trach.  Discussed in LOS 03/07/16 - pt remains appropriate for continued stay.  03/05/16 Discussed in LOS 03/05/16 - pt remains appropriate for continued stay  (continous mechanical ventilation) Palliative eval completed - family wishes to move forward with trach and peg  with the understanding that SNF placement will be out of the state.   03/01/16 [name], RN, BSN [phone] Palliative met with pt family; plan on moving forward with trach.  Family made aware that pt will likely be placed out of state at discharge.  CM will continue to follow for discharge needs  [name], RN 03/07/2016, 11:47 AM

## 2016-03-07 NOTE — Procedures (Signed)
Name:  [name] SchmidtChevella [name] MRN:  [id number] DOB:  01/14/1971  OPERATIVE NOTE  Procedure:  Percutaneous tracheostomy.  Indications:  Ventilator-dependent respiratory failure.  Consent:  Procedure, alternatives, risks and benefits discussed with medical POA.  Questions answered.  Consent obtained.  Anesthesia:  Etomidate, vec, versed, fent  Procedure summary:  Appropriate equipment was assembled.  The patient was identified as [name] and safety timeout was performed. The patient was placed in supine position with a towel roll behind shoulder blades and neck extended.  Sterile technique was used. The patient's neck and upper chest were prepped using chlorhexidine / alcohol scrub and the field was draped in usual sterile fashion with full body drape. After the adequate sedation / anesthesia was achieved, attention was directed at the midline trachea, where the cricothyroid membrane was palpated. Approximately two fingerbreadths above the sternal notch, a verticle incision was created with a scalpel after local infiltration with 0.2% Lidocaine. Then, using Seldinger technique and a percutaneous tracheostomy set, the trachea was entered with a 14 gauge needle with an overlying sheath. This was all confirmed under direct visualization of a fiberoptic flexible bronchoscope. Entrance into the trachea was identified through the third tracheal ring interspace. Following this, a guidewire was inserted. The needle was removed, leaving the sheath and the guidewire intact. Next, the sheath was removed and a small dilator was inserted. The tracheal rings were then dilated. A #6 Shiley was then opened. The balloon was checked. It was placed over a tracheal dilator, which was then advanced over the guidewire and through the previously dilated tract. The Shiley tracheostomy tube was noted to pass in the trachea with little resistance. The guidewire and dilator tubes were removed from the trachea. An inner cannula was  placed through the tracheostomy tube. The tracheostomy was then secured at the anterior neck with 4 monofilament sutures. The oral endotracheal tube was removed and the ventilator was attached to the newly placed tracheostomy tube. Adequate tidal volumes were noted. The cuff was inflated and no evidence of air leak was noted. No evidence of bleeding was noted. At this point, the procedure was concluded. Post-procedure chest x-ray was ordered.  Complications:  No immediate complications were noted.  Hemodynamic parameters and oxygenation remained stable throughout the procedure.  Estimated blood loss:  Less then 5 mL.  Nelda BucksFEINSTEIN,DANIEL J., MD Pulmonary and Critical Care Medicine [facility] Pager: [phone]) [phone]  03/07/2016, 2:46 PM   Should follow up in [place] trach clinic [phone]

## 2016-03-07 NOTE — Progress Notes (Signed)
CSW engaged with Patient's sister via T/C. Patient's sister reports that she would like Patient placed [facility] [place]. CSW will certainly make the referral, however, CSW cannot guarantee that facility will have a bed available at which time, CSW will have to move forward with other referrals including out-of-state Vent/Trach SNF placement. Patient's sister expressed understanding. CSW faxed list of Vent SNF's to Patient's sister at sister's request. Patient's sister agreeable to referrals to [place] at this time but is still hopeful to receive a bed [facility].   Patient received Jan[facility] Mayorach and Bronch on this afternoon. CSW continues to follow.    Enos FlingAshley Amiria Orrison, MSW, LCSW [facility] ED/66M Clinical Social Worker [phone]

## 2016-03-07 NOTE — Procedures (Signed)
Bronchoscopy Procedure Note Hisae Decoursey [id number] December 17, 1970  Procedure: Bronchoscopy Indications: Diagnostic evaluation of the airways, Obtain specimens for culture and/or other diagnostic studies and Remove secretions, tracheostomy placement.  Procedure Details Consent: Risks of procedure as well as the alternatives and risks of each were explained to the (patient/caregiver).  Consent for procedure obtained. Time Out: Verified patient identification, verified procedure, site/side was marked, verified correct patient position, special equipment/implants available, medications/allergies/relevent history reviewed, required imaging and test results available.  Performed  In preparation for procedure, patient was given 100% FiO2 and bronchoscope lubricated. Sedation: Benzodiazepines and Etomidate  Airway entered and the following bronchi were examined: RUL and RML.   Procedures performed: Washings performed from RML Bronchoscope removed.  , Patient placed back on 100% FiO2 at conclusion of procedure.    Evaluation Hemodynamic Status: Transient hypotension treated with fluid; O2 sats: stable throughout Patient's Current Condition: stable Specimens:  Sent purulent fluid Complications: No apparent complications Patient did tolerate procedure well.   Montey Hora, [facility] Pager: [phone]  or [phone] 03/07/2016, 2:53 PM   I supervised this procedure No post wall injury BAL RML, , diffuse mucous obctruction lavaged Trach wnl No bleeding  Lavon Paganini. Titus Mould, MD, [place] Pgr: [facility]

## 2016-03-07 NOTE — Progress Notes (Signed)
RT called to room, patients sats 66%. Patient suctioned with no change. RT bagged patient until sats 100%. Reconnected to vent and suctioned a copious amount of secretions. Sats remained in the upper 90's. Will continue to monitor.

## 2016-03-07 NOTE — Progress Notes (Signed)
CSW attempted Patient's sister, Kathryne ErikssonJanet Yasin, via T/C. Voice message left requesting return phone call. Per RN Case Manager, Patient's sister reports that she would like Patient [facility] [place]. CSW will certainly make the referral, however, CSW cannot guarantee that facility will have a bed available at which time, CSW will have to move forward with other referrals including out-of-state Vent/Trach SNF placement. CSW continues to follow.    Enos FlingAshley Zack Crager, MSW, LCSW [facility]/11M Clinical Social Worker [phone]

## 2016-03-07 NOTE — Progress Notes (Signed)
Nutrition Follow-up  DOCUMENTATION CODES:   Not applicable  INTERVENTION:    Continue Vital AF 1.2 at 55 ml/h (1320 ml per day)  NUTRITION DIAGNOSIS:   Inadequate oral intake related to inability to eat as evidenced by NPO status.  Ongoing  GOAL:   Patient will meet greater than or equal to 90% of their needs  Met  MONITOR:   Vent status, Labs, TF tolerance, I & O's  ASSESSMENT:   46 year old with past medical history of frontotemporal dementia, restless leg syndrome. She is a nursing home resident. She was brought to the ED with altered mental status and respiratory distress. Required intubation in the ED.  Discussed patient in ICU rounds and with RN today. S/P bronchoscopy and trach this afternoon. To remain on vent support. PEG in place, to resume TF after trach today: Vital AF 1.2 at 55 ml/h (1320 ml per day) to provide 1584kcals, 99gm protein, 1058m free water daily Free water 200 ml QID Labs and medications reviewed.  Diet Order:  Diet NPO time specified  Skin:  Wound (see comment) (unstageable to R ankle)  Last BM:  1/4 (450 ml via rectal tube)  Height:   Ht Readings from Last 1 Encounters:  02/22/16 _0  (1.651 m)    Weight:   Wt Readings from Last 1 Encounters:  03/07/16 130 lb 4.7 oz (59.1 kg)    Ideal Body Weight:  56.8 kg  BMI:  Body mass index is 21.68 kg/m.  Estimated Nutritional Needs:   Kcal:  18309 Protein:  90-105 gm  Fluid:  1.8 L  EDUCATION NEEDS:   No education needs identified at this time  KMolli Barrows RBurchinal LBridge City CZwinglePager [phone] Hours Pager [phone]

## 2016-03-07 NOTE — Procedures (Signed)
Bedside Tracheostomy Insertion Procedure Note   Patient Details:   Name: [name] SchmidtChevella [name] DOB: 06/08/1970 MRN: [id number]  Procedure: Tracheostomy  Pre Procedure Assessment: ET Tube Size: 7.5 ET Tube secured at lip (cm): 22 Bite block in place: Yes Breath Sounds: Rhonch  Post Procedure Assessment: BP 104/81 (BP Location: Left Arm)   Pulse (!) 107   Temp (!) 100.4 F (38 C) (Core (Comment)) Comment (Src): foley temp   Resp 16   Ht 5\' 5"  (1.651 m)   Wt 130 lb 4.7 oz (59.1 kg)   SpO2 100%   BMI 21.68 kg/m  O2 sats: stable throughout Complications: No apparent complications Patient did tolerate procedure well Tracheostomy Brand:Shiley Tracheostomy Style:Cuffed Tracheostomy Size: 6 Tracheostomy Secured QMV:HQIONGEvia:Sutures, velcro Tracheostomy Placement Confirmation:Trach cuff visualized and in place and Chest X ray ordered for placement    [name] [name], [name] [name] 03/07/2016, 3:04 PM

## 2016-03-08 DIAGNOSIS — F0150 Vascular dementia without behavioral disturbance: Secondary | ICD-10-CM

## 2016-03-08 DIAGNOSIS — J9600 Acute respiratory failure, unspecified whether with hypoxia or hypercapnia: Secondary | ICD-10-CM

## 2016-03-08 LAB — BASIC METABOLIC PANEL
Anion gap: 12 (ref 5–15)
BUN: 39 mg/dL — ABNORMAL HIGH (ref 6–20)
CO2: 23 mmol/L (ref 22–32)
Calcium: 9.4 mg/dL (ref 8.9–10.3)
Chloride: 112 mmol/L — ABNORMAL HIGH (ref 101–111)
Creatinine, Ser: 1.09 mg/dL — ABNORMAL HIGH (ref 0.44–1.00)
GFR calc Af Amer: >60 mL/min (ref >60)
GFR, EST NON AFRICAN AMERICAN: 60 mL/min — AB (ref >60)
Glucose, Bld: 105 mg/dL — ABNORMAL HIGH (ref 65–99)
POTASSIUM: 4.2 mmol/L (ref 3.5–5.1)
SODIUM: 147 mmol/L — AB (ref 135–145)

## 2016-03-08 LAB — GLUCOSE, CAPILLARY
GLUCOSE-CAPILLARY: 124 mg/dL — AB (ref 65–99)
GLUCOSE-CAPILLARY: 132 mg/dL — AB (ref 65–99)
GLUCOSE-CAPILLARY: 124 mg/dL — AB (ref 65–99)
Glucose-Capillary: 111 mg/dL — ABNORMAL HIGH (ref 65–99)
Glucose-Capillary: 116 mg/dL — ABNORMAL HIGH (ref 65–99)
Glucose-Capillary: 129 mg/dL — ABNORMAL HIGH (ref 65–99)

## 2016-03-08 LAB — BLOOD GAS, ARTERIAL
Acid-Base Excess: 0.5 mmol/L (ref 0.0–2.0)
BICARBONATE: 23.3 mmol/L (ref 20.0–28.0)
DRAWN BY: 40517
FIO2: 40
LHR: 20 {breaths}/min
MECHVT: 400 mL
O2 Saturation: 98.5 %
PEEP: 5 cmH2O
PO2 ART: 119 mmHg — AB (ref 83.0–108.0)
Patient temperature: 98.6
pCO2 arterial: 29.3 mmHg — ABNORMAL LOW (ref 32.0–48.0)
pH, Arterial: 7.511 — ABNORMAL HIGH (ref 7.350–7.450)

## 2016-03-08 MED ORDER — DEXTROSE 5 % IV SOLN
1.0000 g | Freq: Two times a day (BID) | INTRAVENOUS | Status: DC
  Administered 2016-03-08 – 2016-03-09 (×3): 1 g via INTRAVENOUS
  Filled 2016-03-08 (×4): qty 1

## 2016-03-08 NOTE — Clinical Social Work Note (Signed)
Clinical Social Work Assessment  Patient Details  Name: [name] [name] MRN: [id number] Date of Birth: 07/01/1970  Date of referral:  03/08/16               Reason for consult:  Facility Placement                Permission sought to share information with:  Family Supports, Magazine features editoracility Contact Representative Permission granted to share information::  No (Patient nonresponsive and intubated)  Name::     [name] ErikssonJanet [name] (Sister) [phone]  Agency::  [facility]  Relationship::     Contact Information:     Housing/Transportation Living arrangements for the past 2 months:  Skilled Building surveyorursing Facility Source of Information:  Development worker, communityMedical Team, Power of [place], Palliative Care Team, Other (Comment Required) [name] [name]([name] Elwood (Sister) ) Patient Interpreter Needed:  None Criminal Activity/Legal Involvement Pertinent to Current Situation/Hospitalization:  No - Comment as needed Significant Relationships:  Other Family Members, Siblings, Adult Children Lives with:  Facility Resident Do you feel safe going back to the place where you live?  No Need for family participation in patient care:  Yes (Comment) (Patient nonresponsive; Unable to Assess)  Care giving concerns:  Patient will require long-term Vent SNF placement.   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 CSW engaged with Patient's sister, [name] ErikssonJanet Spath (reports she is Patient's legal guardian however, no paperwork has been produced as of yet) via T/C. Per [name] LundJanet, Patient [facility] prior to admission. Patient unable to talk, walk, and is completely dependent for her ADLs. [name] LundJanet reports that Patient has a son who has visited his mother a few times in the nursing facility but has otherwise not been involved in or shown interest in participating in Patient's care and decision making.   [name] LundJanet is firm in her desire to  continue all life prolonging measures even if this does not improve patient's quality of life. She has strong religiosity and is firm in her belief that she must continue all measures and "God will taker her when it's time no matter what we do". CSW explained to [name] LundJanet that because Patient is vent dependent, she may be placed out of state due to limited bed availability in [place]. Patient's sister reports that she would like [facility] at [facility] [place]. CSW will certainly make the referral, however, CSW cannot guarantee that facility will have a bed available at which time, CSW will have to move forward with other referrals including out-of-state Vent/Trach SNF placement. Patient's sister expressed understanding. CSW faxed list of Vent SNF's to Patient's sister at sister's request. Patient's sister agreeable to referrals to [facility] at this time but is still hopeful to receive a [facility].  CSW provided Patient's sister with emotional support and brief supportive counseling. CSW to refer Patient to Vent SNFs and follow for disposition.    Employment status:  Disabled (Comment on whether or not currently receiving Disability) Insurance information:  Managed Medicare PT Recommendations:  Not assessed at this time Information / Referral to community resources:  Other (Comment Required) (Vent SNFs)  Patient/Family's Response to care:  Patient's sister appreciative of care received at this time but is adamant that all life prolonging measures be continued even if this does not improve patient's quality of life.   Patient/Family's Understanding of and Emotional Response to Diagnosis, Current Treatment, and  Prognosis: [name] [name] able to verbalize diagnosis and current treatment however, despite poor prognosis, [name] [name] is firm in her desire to continue all life prolonging measures even if this does not improve patient's quality of life. She has strong religiosity and is firm in her belief that she  must continue all measures and "God will taker her when it's time no matter what we do".  Emotional Assessment Appearance:  Appears stated age, Appears older than stated age Attitude/Demeanor/Rapport:   (Patient nonresponsive; Unable to Assess) Affect (typically observed):   (Patient nonresponsive; Unable to Assess) Orientation:   (Patient nonresponsive; Unable to Assess) Alcohol / Substance use:  Not Applicable Psych involvement (Current and /or in the community):  No (Comment)  Discharge Needs  Concerns to be addressed:  Discharge Planning Concerns, Care Coordination Readmission within the last 30 days:  No Current discharge risk:  Chronically ill, Terminally ill Barriers to Discharge:  Vent Bed not available   [name], LCSW 03/08/2016, 10:01 AM

## 2016-03-08 NOTE — Progress Notes (Signed)
CSW has made the following VENT SNF referrals:  [facility]([place]) [facility]([place], [place]  [facility]([place]) [facility] ([place]  [facility] ([place]  [facility] [address] [facility] ([place])   CSW awaiting bed offers at this time.    Enos FlingAshley Djuan Talton, MSW, LCSW [facility] Clinical Social Worker [phone]

## 2016-03-08 NOTE — [place] FL2 (Signed)
[place] MEDICAID FL2 LEVEL OF CARE SCREENING TOOL     IDENTIFICATION  Patient Name: [name] [name] Birthdate: 07-Sep-1970 Sex: female Admission Date (Current Location): 02/22/2016  [facility] and [place] Number:  Herbalist and Address:  [facility]. [facility], [place] [address] [place], [place] [address]      Provider Number: [id number]  Attending Physician Name and Address:  Rigoberto Noel, MD  Relative Name and Phone Number:  Gertrue Willette [facility]) [phone]    Current Level of Care: Hospital Recommended Level of Care: Vent SNF Prior Approval Number:    Date Approved/Denied:   PASRR Number: [id number]  Discharge Plan: SNF ([facility] SNF)    Current Diagnoses: Patient Active Problem List   Diagnosis Date Noted  . Endotracheally intubated   . Dementia   . Palliative care by specialist   . Goals of care, counseling/discussion   . Pressure injury of skin 02/23/2016  . PNA (pneumonia) 02/22/2016  . Acute respiratory failure with hypoxia and hypercapnia (HCC)   . Closed fracture of right tibia and fibula 07/28/2015  . Staphylococcus hominis sepsis ([place]) 01/24/2015  . UTI (urinary tract infection) due to Enterococcus 01/24/2015  . Sepsis ([place])   . Bacteremia 10/19/2013  . Wheezes 10/19/2013  . UTI (lower urinary tract infection) 10/17/2013  . Sinus tachycardia 10/17/2013  . Dysphagia 10/17/2013  . PEG tube malfunction ([place]) 10/17/2013  . HCAP (healthcare-associated pneumonia) 03/12/2013  . SIRS (systemic inflammatory response syndrome) ([place]) 03/12/2013  . Hyperkalemia 03/12/2013  . Healthcare associated bacterial pneumonia 03/12/2013  . Frontotemporal dementia 02/07/2013  . Dysphasia 02/07/2013  . Constipation 02/07/2013    Orientation RESPIRATION BLADDER Height & Weight      (Nonresponsive/Unable to Assess)  Vent, Tracheostomy(96m Shiley cuffed) 100% vent; FiO2 40% Continent, Indwelling catheter (Chronic Urethral Catheter) Weight: 134 lb 11.2  oz (61.1 kg) Height:  '5\' 5"'  (165.1 cm)  BEHAVIORAL SYMPTOMS/MOOD NEUROLOGICAL BOWEL NUTRITION STATUS   (NONE)  (NONE) Incontinent (Rectal Tube w/ Balloon ) Feeding tube (PEG- Percutaneous Endoscopic) Vital AF 1.2 Cal Continuous  AMBULATORY STATUS COMMUNICATION OF NEEDS Skin   Total Care Does not communicate PU Stage and Appropriate Care, Other (Comment) (Pressure Injury unstageable right ankle scare foam dressing PRN ) PU Stage 1 Dressing:  (Pressure Ulcer Stage 1 buttocks foam dressing PRN )                     Personal Care Assistance Level of Assistance  Total care       Total Care Assistance: Maximum assistance   Functional Limitations Info  Sight, Hearing, Speech Sight Info: Impaired (Nonresponsive/ Unable to Assess) Hearing Info: Impaired (Nonresponsive/ Unable to Assess) Speech Info: Impaired (Intubated/Tracheostomy)    SPECIAL CARE FACTORS FREQUENCY                       Contractures Contractures Info: Present (RUE, LUE, RLE, LLE)    Additional Factors Info  Code Status, Allergies, Isolation Precautions, Suctioning Needs, Insulin Sliding Scale Code Status Info: FULL Allergies Info: Sulfa Antibiotics   Insulin Sliding Scale Info: novoLOG injection 2-6 units every 4 hrs Isolation Precautions Info: Enteric Precautions (UV disinfection) Suctioning Needs: every 2-3 hours; small thin secretions   Current Medications (03/08/2016):  This is the current hospital active medication list Current Facility-Administered Medications  Medication Dose Route Frequency Provider Last Rate Last Dose  . 0.9 %  sodium chloride infusion  250 mL Intravenous PRN BDonita Brooks NP  10 mL/hr at 03/08/16 0700 250 mL at 03/08/16 0700  . acetaminophen (TYLENOL) tablet 650 mg  650 mg Oral Q4H PRN Donita Brooks, NP   650 mg at 03/06/16 1741  . albuterol (PROVENTIL) (2.5 MG/3ML) 0.083% nebulizer solution 2.5 mg  2.5 mg Nebulization Q2H PRN Donita Brooks, NP      . cefTRIAXone (ROCEPHIN) 1  g in dextrose 5 % 50 mL IVPB  1 g Intravenous Q24H Jake Church Masters, RPH   1 g at 03/07/16 1304  . chlorhexidine gluconate (MEDLINE KIT) (PERIDEX) 0.12 % solution 15 mL  15 mL Mouth Rinse BID Praveen Mannam, MD   15 mL at 03/08/16 0858  . enoxaparin (LOVENOX) injection 40 mg  40 mg Subcutaneous Q24H Donita Brooks, NP   40 mg at 03/06/16 1220  . famotidine (PEPCID) 40 MG/5ML suspension 20 mg  20 mg Per Tube BID Donita Brooks, NP   20 mg at 03/08/16 0900  . feeding supplement (VITAL AF 1.2 CAL) liquid 1,000 mL  1,000 mL Per Tube Continuous Rigoberto Noel, MD 55 mL/hr at 03/08/16 0700 1,000 mL at 03/08/16 0700  . fentaNYL (SUBLIMAZE) injection 25-50 mcg  25-50 mcg Intravenous Q2H PRN Raylene Miyamoto, MD      . free water 200 mL  200 mL Per Tube Q6H Rigoberto Noel, MD   200 mL at 03/08/16 0730  . furosemide (LASIX) injection 40 mg  40 mg Intravenous Daily Collene Gobble, MD   40 mg at 03/08/16 0900  . insulin aspart (novoLOG) injection 2-6 Units  2-6 Units Subcutaneous Q4H Donita Brooks, NP   2 Units at 03/08/16 0858  . MEDLINE mouth rinse  15 mL Mouth Rinse QID Praveen Mannam, MD   15 mL at 03/08/16 0430  . ondansetron (ZOFRAN) injection 4 mg  4 mg Intravenous Q6H PRN Donita Brooks, NP      . potassium chloride 20 MEQ/15ML (10%) solution 40 mEq  40 mEq Per Tube Daily Alexa Angela Burke, MD   40 mEq at 03/08/16 0900  . propofol (DIPRIVAN) 500 MG/50ML infusion  5-80 mcg/kg/min Intravenous Once Raylene Miyamoto, MD      . vancomycin (VANCOCIN) 50 mg/mL oral solution 125 mg  125 mg Oral QID Jake Church Masters, RPH   125 mg at 03/08/16 0900     Discharge Medications: Please see discharge summary for a list of discharge medications.  Relevant Imaging Results:  Relevant Lab Results:   Additional Information SS# [id number]  Lind Covert, LCSW

## 2016-03-08 NOTE — Progress Notes (Addendum)
PULMONARY / CRITICAL CARE MEDICINE   Name: [name] [name] MRN: [id number] DOB: [birth year]    ADMISSION DATE:  02/22/2016 CONSULTATION DATE:  02/22/16  REFERRING MD:  Baxter Hire Ward MD  CHIEF COMPLAINT:  Acute respiratory failure, Multilobar PNA  HISTORY OF PRESENT ILLNESS:   [name] [name] is a 46 year old with PMHx of frontotemporal dementia, restless leg syndrome who was admitted on 12/21 with septic shock, acute respiratory failure and R multilobar PNA who remains intubated as she continues to fail SBTs.   Regarding her neurologic issues. 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 and has been evaluated by [facility] neurology in 2015. This was felt to be more consistent with frontotemporal dementia. Given the progressive nature of this disease palliation was advised. 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 The case was discussed with palliative care team at [facility] during her last admission in May 2017. Outpatient palliative care follow-up was recommended. However she remains full code.  SUBJECTIVE:  No acute events overnight. Patient underwent successful tracheostomy placement yesterday 1/4 with bronchoscopy. Patient was weaning this morning on 15/5 but with inconsistent respiratory rate going from 12/min to 6 breaths per minute.   VITAL SIGNS: BP 103/75   Pulse (!) 101   Temp 99.3 F (37.4 C) (Oral)   Resp 20   Ht 5\' 5"  (1.651 m)   Wt 134 lb 11.2 oz (61.1 kg)   SpO2 100%   BMI 22.42 kg/m   HEMODYNAMICS:    VENTILATOR SETTINGS: Vent Mode: PRVC FiO2 (%):  [40 %] 40 % Set Rate:  [16 bmp-20 bmp] 20 bmp Vt Set:  [400 mL] 400 mL PEEP:  [5 cmH20] 5 cmH20 Plateau Pressure:  [17 cmH20-23 cmH20] 18 cmH20  INTAKE / OUTPUT: I/O last 3 completed shifts: In: 2381.5 [I.V.:330; Other:210; NG/GT:1791.5; IV Piggyback:50] Out: 1270 [Urine:1170; Stool:100]  PHYSICAL EXAMINATION:  General:  Chronically ill appearing, NAD Neuro: Does not follow commands, appears  comfortable HEENT: PEERL, does not track, trach in place Cardiovascular:  Tachycardic, regular rhythm, no MRG Lungs: Mild inspiratory crackles bilaterally, no wheezing or rhonchi.  Abdomen:  NTND, + BS Musculoskeletal:  Contracted upper extremities bilaterally  LABS:  BMET  Recent Labs Lab 03/05/16 0430 03/06/16 0400 03/07/16 0345  NA 143 145 146*  K 3.3* 3.4* 3.8  CL 103 108 109  CO2 26 25 26   BUN 40* 48* 47*  CREATININE 1.36* 1.26* 1.16*  GLUCOSE 137* 132* 121*    Electrolytes  Recent Labs Lab 03/02/16 0441  03/05/16 0430 03/06/16 0400 03/07/16 0345  CALCIUM 9.0  < > 9.2 9.3 9.4  MG 1.9  --   --   --   --   PHOS 3.4  --   --   --   --   < > = values in this interval not displayed.  CBC  Recent Labs Lab 03/02/16 0441 03/06/16 0400 03/07/16 0345  WBC 19.9* 22.9* 22.6*  HGB 9.5* 10.6* 10.8*  HCT 30.4* 33.0* 34.2*  PLT 338 435* 475*    Coag's  Recent Labs Lab 03/06/16 0400  APTT 31  INR 1.13    Sepsis Markers No results for input(s): LATICACIDVEN, PROCALCITON, O2SATVEN in the last 168 hours.  ABG No results for input(s): PHART, PCO2ART, PO2ART in the last 168 hours.  Liver Enzymes No results for input(s): AST, ALT, ALKPHOS, BILITOT, ALBUMIN in the last 168 hours.  Cardiac Enzymes No results for input(s): TROPONINI, PROBNP in the last 168 hours.  Glucose  Recent  Labs Lab 03/07/16 1153 03/07/16 1550 03/07/16 2008 03/07/16 2342 03/08/16 0356 03/08/16 0729  GLUCAP 122* 114* 124* 118* 124* 132*    Imaging Dg Chest Port 1 View  Result Date: 03/07/2016 CLINICAL DATA:  Respiratory failure. EXAM: PORTABLE CHEST 1 VIEW COMPARISON:  03/04/2016. FINDINGS: Interim placement of tracheostomy tube. Left IJ line in stable position. Heart size stable. Low lung volumes with mild bibasilar infiltrates. Small left pleural effusion cannot be excluded. Elevation left hemidiaphragm again noted. IMPRESSION: 1. Tracheostomy tube in good anatomic position.  Left IJ line in good anatomic position. 2. Low lung volumes. Persistent left base infiltrate. No change from prior exam. Electronically Signed   By: Maisie Fus  Register   On: 03/07/2016 16:09     STUDIES:  CT chest 12/21 > Complete right upper lobe consolidation and significant volume loss with occlusion of the right upper lobe bronchus by low-attenuation material, favoring mucous plugging. No discrete obstructing mass. 3. Moderate patchy consolidation and ground-glass attenuation throughout the right middle lobe, lingula and bilateral lower lobes, favoring multilobar pneumonia and/or aspiration 12/21 bronchoscopy >> mucus plugs Rt CXR 1/4 >> Persistent left lower lobe atelectasis or pneumonia somewhat improved since the previous study. Probable small left pleural effusion. The support devices are in stable position. CXR 1/4 >> Tracheostomy tube in good anatomic position. Left IJ line in good anatomic position. Low lung volumes. Persistent left base infiltrate. No change from prior exam.  CULTURES: Bcx 12/21 >> coag neg staph 1/2 Sputum Cx 12/21 > pseudomonas pan-S, MRSA Stool norovirus + Stool c diff Ag positive, toxin negative  RVP >> negative  Urine strep ag neg Urine legionella neg BCx 1/2 >> NGTD x 2 days UCx >> Gram Negative Rods >100,000 colonies  ANTIBIOTICS: Zosyn 12/21 >> 12/25 Azithro 12/21 >> 12/23 Cipro 12/25 >> 12/30 Vanco 12/21 >> to complete today Ceftriaxone 1/3 >>  SIGNIFICANT EVENTS: 12/21 > Admitted with respiratory failure  LINES/TUBES: ETT 12/21>>1/4 Trach 1/4 (df)>> Left IJ 12/22 >> Foley >> PEG >>  DISCUSSION: [name] [name] is a 46 year old NHR with PMHx of frontotemporal dementia, restless leg syndrome who was admitted with septic shock, acute respiratory failure and R multilobar PNA who remains intubated as she continues to fail SBTs. Janina Mayo was placed on 1/4.   ASSESSMENT / PLAN:  PULMONARY A: Acute Hypercapnic, Hypoxic Respiratory Failure s/p Trach  Placement 1/4 Multilobar Pneumonia/ RUL ATX P:   -Continue vent support 400/20/40/5 -Weaning attempts, PS increase > Patient was weaning this morning on 15/5 but with inconsistent respiratory rate going from 12/min to 6 breaths per minute.  -Trach placed 1/4 -Awaiting LTAC placement -Transfer to SDU and TRH today -Albuterol Q2H prn   CARDIOVASCULAR A:  Sinus Tachycardia P:  Telemetry   RENAL A:   Mild Hypernatremia AKI P:   -Monitor urine output (out -725 of urine yesterday, net +386) -Continue free water 200 mL Q6H -Lasix 40 mg QD  GASTROINTESTINAL A:   Diarrhea- norovirus; C diff positive, toxin negative  P:   -Enteric precautions -Continue TF through PEG -Vancomycin 125 mg QID, continue 10 days past completion of Ceftriaxone (stop date: 03/20/16) -Pepcid 20 mg BID -Zofran 4 mg Q6H prn nausea  HEMATOLOGIC A:   Chronic Anemia P:  -Follow CBC -Lovenox 40 mg SQ QD  INFECTIOUS A:   Multilobar PNA C Diff Colitis UTI: UCx growing gram-negative rods P:   -Continue PO vanco 10 days past completion of Ceftriaxone (stop date: 03/20/16) -Ceftriaxone for UTI (stop date: 03/10/16) -Tylenol prn -BCx  1/2 >> NGTD -UCx 1/3 >> Gram negative rods  ENDOCRINE A:   Elevated Glucose P:   -SSI coverage  NEUROLOGIC A:   Frontotemporal Dementia P:   -Palliation recommended by [facility] given the progressive nature of the disease. -Family continues to desire aggressive care   DVT/PE ppx: Lovenox SQ QD FEN: TF, Free water 200 mL Q6H  FAMILY  - Updates: Discussed with patient's daughter on 1/3- continues to want aggressive care  Karlene LinemanAlexa Burns, DO PGY-3 Internal Medicine Resident Pager # [phone] 03/08/2016 8:04 AM   STAFF NOTE: Cindi CarbonI, Waverley Krempasky, MD FACP have personally reviewed patient's available data, including medical history, events of note, physical examination and test results as part of my evaluation. I have discussed with resident/NP and other care providers such as  pharmacist, RN and RRT. In addition, I personally evaluated patient and elicited key findings of: trach clean, eyes open, coarse BS, UTI has gram neg rods, foley changed, with prior pseudomonas and nosocomial exposure would change to ceftaz dc ceftriaxone ( fever 1/4), wean attempting requires higher PS 15, but has low MV which is likley all related to her neuro injury, will ensure apnea is not related to alkalosis, obtain abg, feeding through peg, aggressive measures, acls, check, pressors and heroics would be medically ineffective and allow her to suffer with no benefits, I am questioning family judgement, would want then to visit and assess this, bmet today on lasix, dc k supp, to triad, vent bed, likley she is NOT wean able and will be vent dependent, extending oral vanc 10 days post stop date ceftaz/ ceftriaxone The patient is critically ill with multiple organ systems failure and requires high complexity decision making for assessment and support, frequent evaluation and titration of therapies, application of advanced monitoring technologies and extensive interpretation of multiple databases.   Critical Care Time devoted to patient care services described in this note is 30 Minutes. This time reflects time of care of this signee: Rory Percyaniel Elaya Droege, MD FACP. This critical care time does not reflect procedure time, or teaching time or supervisory time of PA/NP/Med student/Med Resident etc but could involve care discussion time. Rest per NP/medical resident whose note is outlined above and that I agree with   Mcarthur Rossettianiel J. Tyson AliasFeinstein, MD, FACP Pgr: [phone] [facility] 03/08/2016 9:43 AM

## 2016-03-09 LAB — GLUCOSE, CAPILLARY
GLUCOSE-CAPILLARY: 112 mg/dL — AB (ref 65–99)
GLUCOSE-CAPILLARY: 118 mg/dL — AB (ref 65–99)
Glucose-Capillary: 120 mg/dL — ABNORMAL HIGH (ref 65–99)
Glucose-Capillary: 127 mg/dL — ABNORMAL HIGH (ref 65–99)
Glucose-Capillary: 123 mg/dL — ABNORMAL HIGH (ref 65–99)

## 2016-03-09 LAB — URINE CULTURE: Culture: >=100000 — AB

## 2016-03-09 MED ORDER — VANCOMYCIN HCL 500 MG IV SOLR
500.0000 mg | Freq: Two times a day (BID) | INTRAVENOUS | Status: DC
  Administered 2016-03-10 – 2016-03-14 (×9): 500 mg via INTRAVENOUS
  Filled 2016-03-09 (×9): qty 500

## 2016-03-09 MED ORDER — VANCOMYCIN HCL IN DEXTROSE 1-5 GM/200ML-% IV SOLN
1000.0000 mg | Freq: Once | INTRAVENOUS | Status: AC
  Administered 2016-03-09: 1000 mg via INTRAVENOUS
  Filled 2016-03-09: qty 200

## 2016-03-09 MED ORDER — SODIUM CHLORIDE 0.9 % IV SOLN
1.0000 g | Freq: Three times a day (TID) | INTRAVENOUS | Status: DC
  Administered 2016-03-09 – 2016-03-23 (×41): 1 g via INTRAVENOUS
  Filled 2016-03-09 (×44): qty 1

## 2016-03-09 NOTE — Progress Notes (Signed)
PROGRESS NOTE    [name] [name]  TGY:[id number] DOB: Dec 13, 1970 DOA: 02/22/2016 PCP: Garwin Brothers, MD     Brief Narrative:  [name] [name] is a 46 year old female with past medical history of frontotemporal dementia (?Crutzfeld Edison Nasuti, but eval in [facility] and thought more consistent with frontotemporal dementia), restless leg syndrome who was admitted on 12/21 secondary to AMS and respiratory distress. She was diagnosed with septic shock, acute respiratory failure, right-sided multilobar pneumonia. She was intubated on 12/21 and failed to wean. She underwent tracheostomy 1/4 and will require vent support at discharge. She remains non-verbal and non-interactive.   Assessment & Plan:   Active Problems:   PNA (pneumonia)   Acute respiratory failure with hypoxia and hypercapnia (HCC)   Pressure injury of skin   Palliative care by specialist   Goals of care, counseling/discussion   Dementia   Endotracheally intubated  Acute hypoxemic respiratory failure -Intubated and unable to wean, tracheostomy placed 1/4 -Will need vent-SNF vs LTAC at discharge   Septic shock secondary to multilobar pneumonia, C Diff colitis, ESBL UTI  -Shock resolved, but continues to be septic  -Blood cultures negative to date  -BAL with MRSA, pseudomonas --> vanco IV and merrem -Urine culture with proteus mirabilis, ESBL --> merrem  -C Diff --> Vanco PO completed over 14 day course. Monitor stool output. If continues to have large amount of stool, will give vanco PO as well as IV flagyl   Hypernatremia -Free water thru PEG  Frontotemporal dementia -Evaluated by [facility] in 2015, questionable dx Crutzfeld Edison Nasuti in chart -Palliative care has been involved in the past, family continues to wish for aggressive medical care    DVT prophylaxis: lovenox Code Status: Full Family Communication:  No family at bedside Disposition Plan: LTAC vs SNF    Consultants:   PCCM   Procedures:  ETT 12/21>>1/4 Trach 1/4 >> Left  IJ 12/22 >> Foley >> PEG >> Rectal tube >>  Antimicrobials:  Zosyn 12/21 - 12/25 Azithro 12/21 - 12/23 Vanco PO 12/21 - 1/6  Cipro 12/25 - 12/30 Ceftriaxone 1/3 - 1/5 Ceftazidime 1/5 - 1/6 Merrem 1/6 >>>  Vanco IV 1/6 >>>  Subjective: Remains nonverbal, noninteractive.   Objective: Vitals:   03/09/16 0804 03/09/16 0810 03/09/16 1107 03/09/16 1158  BP:      Pulse:      Resp:      Temp: 98.8 F (37.1 C)   99 F (37.2 C)  TempSrc: Oral   Oral  SpO2:  97% 100%   Weight:      Height:        Intake/Output Summary (Last 24 hours) at 03/09/16 1521 Last data filed at 03/09/16 1200  Gross per 24 hour  Intake             2005 ml  Output             1275 ml  Net              730 ml   Filed Weights   03/07/16 0300 03/08/16 0434 03/09/16 0600  Weight: 59.1 kg (130 lb 4.7 oz) 61.1 kg (134 lb 11.2 oz) 60.5 kg (133 lb 6.1 oz)    Examination:  General exam: Obtunded, does not awake to voice, diaphoretic  Respiratory system: Vent-trach. Course breath sounds  Cardiovascular system: S1 & S2 heard, tachycardic, regular. No JVD, murmurs, rubs, gallops or clicks.  Gastrointestinal system: Abdomen is nondistended, soft and nontender. Central nervous system: does not awake to voice  Extremities: Symmetric Skin: No rashes, lesions or ulcers on exposed skin   Data Reviewed: I have personally reviewed following labs and imaging studies  CBC:  Recent Labs Lab 03/06/16 0400 03/07/16 0345  WBC 22.9* 22.6*  NEUTROABS  --  16.4*  HGB 10.6* 10.8*  HCT 33.0* 34.2*  MCV 82.9 83.2  PLT 435* 384*   Basic Metabolic Panel:  Recent Labs Lab 03/04/16 0500 03/05/16 0430 03/06/16 0400 03/07/16 0345 03/08/16 1206  NA 146* 143 145 146* 147*  K 4.3 3.3* 3.4* 3.8 4.2  CL 107 103 108 109 112*  CO2 _0 GLUCOSE 122* 137* 132* 121* 105*  BUN 34* 40* 48* 47* 39*  CREATININE 1.21* 1.36* 1.26* 1.16* 1.09*  CALCIUM 9.7 9.2 9.3 9.4 9.4   GFR: Estimated Creatinine Clearance:  58 mL/min (by C-G formula based on SCr of 1.09 mg/dL (H)). Liver Function Tests: No results for input(s): AST, ALT, ALKPHOS, BILITOT, PROT, ALBUMIN in the last 168 hours. No results for input(s): LIPASE, AMYLASE in the last 168 hours. No results for input(s): AMMONIA in the last 168 hours. Coagulation Profile:  Recent Labs Lab 03/06/16 0400  INR 1.13   Cardiac Enzymes: No results for input(s): CKTOTAL, CKMB, CKMBINDEX, TROPONINI in the last 168 hours. BNP (last 3 results) No results for input(s): PROBNP in the last 8760 hours. HbA1C: No results for input(s): HGBA1C in the last 72 hours. CBG:  Recent Labs Lab 03/08/16 1941 03/08/16 2352 03/09/16 0340 03/09/16 0807 03/09/16 1200  GLUCAP 129* 116* 112* 118* 120*   Lipid Profile: No results for input(s): CHOL, HDL, LDLCALC, TRIG, CHOLHDL, LDLDIRECT in the last 72 hours. Thyroid Function Tests: No results for input(s): TSH, T4TOTAL, FREET4, T3FREE, THYROIDAB in the last 72 hours. Anemia Panel: No results for input(s): VITAMINB12, FOLATE, FERRITIN, TIBC, IRON, RETICCTPCT in the last 72 hours. Sepsis Labs: No results for input(s): PROCALCITON, LATICACIDVEN in the last 168 hours.  Recent Results (from the past 240 hour(s))  Culture, blood (Routine X 2) w Reflex to ID Panel     Status: None (Preliminary result)   Collection Time: 03/05/16  7:07 PM  Result Value Ref Range Status   Specimen Description BLOOD LEFT HAND  Final   Special Requests BOTTLES DRAWN AEROBIC AND ANAEROBIC 5CC EACH  Final   Culture NO GROWTH 4 DAYS  Final   Report Status PENDING  Incomplete  Culture, blood (Routine X 2) w Reflex to ID Panel     Status: None (Preliminary result)   Collection Time: 03/05/16  7:08 PM  Result Value Ref Range Status   Specimen Description BLOOD RIGHT HAND  Final   Special Requests BOTTLES DRAWN AEROBIC AND ANAEROBIC 5CC EACH  Final   Culture NO GROWTH 4 DAYS  Final   Report Status PENDING  Incomplete  Culture, Urine      Status: Abnormal   Collection Time: 03/06/16 11:30 AM  Result Value Ref Range Status   Specimen Description URINE, RANDOM  Final   Special Requests NONE  Final   Culture (A)  Final    >=100,000 COLONIES/mL PROTEUS MIRABILIS >=100,000 COLONIES/mL ESCHERICHIA COLI Confirmed Extended Spectrum Beta-Lactamase Producer (ESBL)    Report Status 03/09/2016 FINAL  Final   Organism ID, Bacteria PROTEUS MIRABILIS (A)  Final   Organism ID, Bacteria ESCHERICHIA COLI (A)  Final      Susceptibility   Escherichia coli - MIC*    AMPICILLIN >=32 RESISTANT Resistant     CEFAZOLIN >=64 RESISTANT  Resistant     CEFTRIAXONE >=64 RESISTANT Resistant     CIPROFLOXACIN >=4 RESISTANT Resistant     GENTAMICIN <=1 SENSITIVE Sensitive     IMIPENEM <=0.25 SENSITIVE Sensitive     NITROFURANTOIN <=16 SENSITIVE Sensitive     TRIMETH/SULFA <=20 SENSITIVE Sensitive     AMPICILLIN/SULBACTAM 16 INTERMEDIATE Intermediate     PIP/TAZO <=4 SENSITIVE Sensitive     Extended ESBL POSITIVE Resistant     * >=100,000 COLONIES/mL ESCHERICHIA COLI   Proteus mirabilis - MIC*    AMPICILLIN >=32 RESISTANT Resistant     CEFAZOLIN <=4 SENSITIVE Sensitive     CEFTRIAXONE <=1 SENSITIVE Sensitive     CIPROFLOXACIN >=4 RESISTANT Resistant     GENTAMICIN <=1 SENSITIVE Sensitive     IMIPENEM 4 SENSITIVE Sensitive     NITROFURANTOIN 128 RESISTANT Resistant     TRIMETH/SULFA 160 RESISTANT Resistant     AMPICILLIN/SULBACTAM 4 SENSITIVE Sensitive     PIP/TAZO <=4 SENSITIVE Sensitive     * >=100,000 COLONIES/mL PROTEUS MIRABILIS  Culture, bal-quantitative     Status: Abnormal (Preliminary result)   Collection Time: 03/07/16  4:08 PM  Result Value Ref Range Status   Specimen Description BRONCHIAL ALVEOLAR LAVAGE  Final   Special Requests NONE  Final   Gram Stain   Final    ABUNDANT WBC PRESENT, PREDOMINANTLY PMN RARE GRAM NEGATIVE RODS RARE GRAM POSITIVE COCCI IN CLUSTERS    Culture (A)  Final    70,000 COLONIES/mL STAPHYLOCOCCUS  AUREUS 50,000 COLONIES/mL PSEUDOMONAS AERUGINOSA    Report Status PENDING  Incomplete       Radiology Studies: Dg Chest Port 1 View  Result Date: 03/07/2016 CLINICAL DATA:  Respiratory failure. EXAM: PORTABLE CHEST 1 VIEW COMPARISON:  03/04/2016. FINDINGS: Interim placement of tracheostomy tube. Left IJ line in stable position. Heart size stable. Low lung volumes with mild bibasilar infiltrates. Small left pleural effusion cannot be excluded. Elevation left hemidiaphragm again noted. IMPRESSION: 1. Tracheostomy tube in good anatomic position. Left IJ line in good anatomic position. 2. Low lung volumes. Persistent left base infiltrate. No change from prior exam. Electronically Signed   By: Marcello Moores  Register   On: 03/07/2016 16:09      Scheduled Meds: . chlorhexidine gluconate (MEDLINE KIT)  15 mL Mouth Rinse BID  . enoxaparin (LOVENOX) injection  40 mg Subcutaneous Q24H  . famotidine  20 mg Per Tube BID  . free water  200 mL Per Tube Q6H  . furosemide  40 mg Intravenous Daily  . insulin aspart  2-6 Units Subcutaneous Q4H  . mouth rinse  15 mL Mouth Rinse QID   Continuous Infusions: . feeding supplement (VITAL AF 1.2 CAL) 1,000 mL (03/09/16 0850)     LOS: 16 days    Time spent: 40 minutes   Dessa Phi, DO [facility] [email or web address] Password [email or web address] 03/09/2016, 3:21 PM

## 2016-03-09 NOTE — Progress Notes (Signed)
Pharmacy Antibiotic Note  [name] [name] is a 46 y.o. female admitted on 02/22/2016 with pneumonia and UTI.  Pharmacy has been consulted for vancomycin and meropenem dosing. Pt is afebrile but WBC is elevated at 22.6. SCr is above baseline at 1.09.   Plan: Vancomycin 1gm IV x 1 then 528m IV Q12H Meropenem 1gm IV Q8H F/u renal fxn, C&S, clinical status and trough at SS  Height: _0  (165.1 cm) Weight: 133 lb 6.1 oz (60.5 kg) IBW/kg (Calculated) : 57  Temp (24hrs), Avg:98.6 F (37 C), Min:98.1 F (36.7 C), Max:99 F (37.2 C)   Recent Labs Lab 03/04/16 0500 03/05/16 0430 03/06/16 0400 03/07/16 0345 03/08/16 1206  WBC  --   --  22.9* 22.6*  --   CREATININE 1.21* 1.36* 1.26* 1.16* 1.09*    Estimated Creatinine Clearance: 58 mL/min (by C-G formula based on SCr of 1.09 mg/dL (H)).    Allergies  Allergen Reactions  . Sulfa Antibiotics Other (See Comments)    Per MAR    Antimicrobials this admission: Vancomycin 12/21>>12/24; 1/6>> Zosyn 12/21>>12/24 Azithromycin 12/21 >> 12/23 PO Vanc 12/21 >>  Cipro 12/25 >>12/30 Ceftriaxone 1/3 >> 1/5 Ceftazidime 1/5 >>1/6 Meropenem 1/6>>  Dose adjustments this admission: N/A  Microbiology results: 12/21BCx: 1/2 GPC clusters (BCID- CONS) 12/21UCx: multiple species, none predominant 12/21 BAL: >100K Pseudomonas; 20K MRSA 12/21 C. Diff Ag positive, toxin negative, PCR positive 12/21 Legionella UAg: ip 12/21 Strep UAg: negative 12/21 RVP: negative 12/21 GI Panel: + Norovirus 12/21 MRSA PCR negative 12/21 Influenza PCR negative 1/3 urine cx: Proteus, E.coli 1/2 blood cx: ngtd 1/4 BAL: 70K staph aureus, 50k pseudomonas  Thank you for allowing pharmacy to be a part of this patient's care.  [name] [name], RRande Lawman1/08/2016 3:25 PM

## 2016-03-10 LAB — CBC WITH DIFFERENTIAL/PLATELET
Basophils Absolute: 0.1 10*3/uL (ref 0.0–0.1)
Basophils Relative: 0 %
EOS PCT: 4 %
Eosinophils Absolute: 0.7 10*3/uL (ref 0.0–0.7)
HCT: 33.4 % — ABNORMAL LOW (ref 36.0–46.0)
Hemoglobin: 10.4 g/dL — ABNORMAL LOW (ref 12.0–15.0)
LYMPHS ABS: 4.5 10*3/uL — AB (ref 0.7–4.0)
LYMPHS PCT: 25 %
MCH: 25.9 pg — AB (ref 26.0–34.0)
MCHC: 31.1 g/dL (ref 30.0–36.0)
MCV: 83.3 fL (ref 78.0–100.0)
MONO ABS: 0.6 10*3/uL (ref 0.1–1.0)
MONOS PCT: 3 %
Neutro Abs: 12.4 10*3/uL — ABNORMAL HIGH (ref 1.7–7.7)
Neutrophils Relative %: 68 %
PLATELETS: 437 10*3/uL — AB (ref 150–400)
RBC: 4.01 MIL/uL (ref 3.87–5.11)
RDW: 16.7 % — AB (ref 11.5–15.5)
WBC: 18.3 10*3/uL — ABNORMAL HIGH (ref 4.0–10.5)

## 2016-03-10 LAB — CULTURE, BLOOD (ROUTINE X 2)
Culture: NO GROWTH
Culture: NO GROWTH

## 2016-03-10 LAB — CULTURE, BAL-QUANTITATIVE

## 2016-03-10 LAB — CULTURE, BAL-QUANTITATIVE W GRAM STAIN: Culture: 70000 — AB

## 2016-03-10 LAB — GLUCOSE, CAPILLARY
GLUCOSE-CAPILLARY: 118 mg/dL — AB (ref 65–99)
Glucose-Capillary: 107 mg/dL — ABNORMAL HIGH (ref 65–99)
Glucose-Capillary: 112 mg/dL — ABNORMAL HIGH (ref 65–99)
Glucose-Capillary: 116 mg/dL — ABNORMAL HIGH (ref 65–99)
Glucose-Capillary: 123 mg/dL — ABNORMAL HIGH (ref 65–99)
Glucose-Capillary: 126 mg/dL — ABNORMAL HIGH (ref 65–99)
Glucose-Capillary: 139 mg/dL — ABNORMAL HIGH (ref 65–99)

## 2016-03-10 LAB — BASIC METABOLIC PANEL
Anion gap: 11 (ref 5–15)
BUN: 35 mg/dL — AB (ref 6–20)
CO2: 25 mmol/L (ref 22–32)
CREATININE: 0.82 mg/dL (ref 0.44–1.00)
Calcium: 9 mg/dL (ref 8.9–10.3)
Chloride: 106 mmol/L (ref 101–111)
GFR calc Af Amer: >60 mL/min (ref >60)
GLUCOSE: 124 mg/dL — AB (ref 65–99)
POTASSIUM: 3.3 mmol/L — AB (ref 3.5–5.1)
Sodium: 142 mmol/L (ref 135–145)

## 2016-03-10 MED ORDER — POTASSIUM CHLORIDE 20 MEQ/15ML (10%) PO SOLN
20.0000 meq | ORAL | Status: AC
  Administered 2016-03-10 (×2): 20 meq
  Filled 2016-03-10 (×4): qty 15

## 2016-03-10 MED ORDER — VANCOMYCIN 50 MG/ML ORAL SOLUTION
125.0000 mg | Freq: Four times a day (QID) | ORAL | Status: DC
  Administered 2016-03-10 – 2016-03-24 (×55): 125 mg via ORAL
  Filled 2016-03-10 (×60): qty 2.5

## 2016-03-10 NOTE — Progress Notes (Signed)
[facility] REPLACEMENT ONLY  The patient does [facility] based on the criteria listed below:   1. Is GFR >/= 40 ml/min? Yes.    Patient's GFR today is >60 2. Is urine output >/= 0.5 ml/kg/hr for the last 6 hours? Yes.   Patient's UOP is 1.04 ml/kg/hr 3. Is BUN < 60 mg/dL? Yes.    Patient's BUN today is 35 4. Abnormal electrolyte  K 3.3 5. Ordered repletion with: per protocol 6. If a panic level lab has been reported, has the CCM MD in charge been notified? Yes.  .   Physician:  Christene Slatese Dios  Hayden RasmussenSuits, Kinzy Weyers [facility] LLCMcEachran 03/10/2016 6:46 AM

## 2016-03-10 NOTE — Progress Notes (Signed)
RT note- placed back to full support for increased RR >35.

## 2016-03-10 NOTE — Progress Notes (Signed)
PROGRESS NOTE    [name] [name]  TMH:[id number] DOB: 1971-01-18 DOA: 02/22/2016 PCP: Garwin Brothers, MD     Brief Narrative:  [name] [name] is a 46 year old female with past medical history of frontotemporal dementia (?[place] Edison Nasuti, but eval in [facility] and thought more consistent with frontotemporal dementia), restless leg syndrome who was admitted on 12/21 secondary to AMS and respiratory distress. She was diagnosed with septic shock, acute respiratory failure, right-sided multilobar pneumonia. She was intubated on 12/21 and failed to wean. She underwent tracheostomy 1/4 and will require vent support at discharge. She remains non-verbal and non-interactive.   Assessment & Plan:   Active Problems:   PNA (pneumonia)   Acute respiratory failure with hypoxia and hypercapnia (HCC)   Pressure injury of skin   Palliative care by specialist   Goals of care, counseling/discussion   Dementia   Endotracheally intubated  Acute hypoxemic respiratory failure -Intubated and unable to wean, tracheostomy placed 1/4 -Will need vent-SNF vs LTAC at discharge   Septic shock secondary to multilobar pneumonia, C Diff colitis, ESBL UTI  -Shock resolved, but continues to be septic. WBC improving. Afebrile.   -Blood cultures negative to date  -BAL with MRSA, pseudomonas --> vanco IV and merrem -Urine culture with proteus mirabilis, ESBL --> merrem  -C Diff --> Vanco PO for 10 days past completion of other antibiotics   Hypernatremia -Free water thru PEG  Frontotemporal dementia -Evaluated by [facility] in 2015, questionable dx Crutzfeld Jacob in chart -Palliative care has been involved in the past, family continues to wish for aggressive medical care    DVT prophylaxis: lovenox Code Status: Full Family Communication:  No family at bedside Disposition Plan: LTAC vs SNF    Consultants:   PCCM   Procedures:  ETT 12/21>>1/4 Trach 1/4 >> Left IJ 12/22 >> Foley >> PEG >> Rectal tube  >>  Antimicrobials:  Zosyn 12/21 - 12/25 Azithro 12/21 - 12/23 Cipro 12/25 - 12/30 Ceftriaxone 1/3 - 1/5 Ceftazidime 1/5 - 1/6 Vanco PO 12/21 >>>  Merrem 1/6 >>>  Vanco IV 1/6 >>>  Subjective: Remains nonverbal, noninteractive. No acute events.   Objective: Vitals:   03/10/16 0600 03/10/16 0700 03/10/16 1130 03/10/16 1216  BP: 125/84 98/77    Pulse: 97 89    Resp: (!) 23 15    Temp:   98 F (36.7 C)   TempSrc:   Oral   SpO2: 100% 100%  100%  Weight:      Height:        Intake/Output Summary (Last 24 hours) at 03/10/16 1228 Last data filed at 03/10/16 1000  Gross per 24 hour  Intake             2220 ml  Output             1925 ml  Net              295 ml   Filed Weights   03/08/16 0434 03/09/16 0600 03/10/16 0309  Weight: 61.1 kg (134 lb 11.2 oz) 60.5 kg (133 lb 6.1 oz) 61.4 kg (135 lb 5.8 oz)    Examination:  General exam: Lethargic, does not respond to voice Respiratory system: Vent-trach. Course breath sounds  Cardiovascular system: S1 & S2 heard, tachycardic, regular. No JVD, murmurs, rubs, gallops or clicks.  Gastrointestinal system: Abdomen is nondistended, soft and nontender. Central nervous system: does not respond to voice  Extremities: Symmetric, contracted  Skin: No rashes, lesions or ulcers on exposed skin  Data Reviewed: I have personally reviewed following labs and imaging studies  CBC:  Recent Labs Lab 03/06/16 0400 03/07/16 0345 03/10/16 0417  WBC 22.9* 22.6* 18.3*  NEUTROABS  --  16.4* 12.4*  HGB 10.6* 10.8* 10.4*  HCT 33.0* 34.2* 33.4*  MCV 82.9 83.2 83.3  PLT 435* 475* 270*   Basic Metabolic Panel:  Recent Labs Lab 03/05/16 0430 03/06/16 0400 03/07/16 0345 03/08/16 1206 03/10/16 0417  NA 143 145 146* 147* 142  K 3.3* 3.4* 3.8 4.2 3.3*  CL 103 108 109 112* 106  CO2 _0 GLUCOSE 137* 132* 121* 105* 124*  BUN 40* 48* 47* 39* 35*  CREATININE 1.36* 1.26* 1.16* 1.09* 0.82  CALCIUM 9.2 9.3 9.4 9.4 9.0    GFR: Estimated Creatinine Clearance: 77.1 mL/min (by C-G formula based on SCr of 0.82 mg/dL). Liver Function Tests: No results for input(s): AST, ALT, ALKPHOS, BILITOT, PROT, ALBUMIN in the last 168 hours. No results for input(s): LIPASE, AMYLASE in the last 168 hours. No results for input(s): AMMONIA in the last 168 hours. Coagulation Profile:  Recent Labs Lab 03/06/16 0400  INR 1.13   Cardiac Enzymes: No results for input(s): CKTOTAL, CKMB, CKMBINDEX, TROPONINI in the last 168 hours. BNP (last 3 results) No results for input(s): PROBNP in the last 8760 hours. HbA1C: No results for input(s): HGBA1C in the last 72 hours. CBG:  Recent Labs Lab 03/09/16 2032 03/10/16 0039 03/10/16 0418 03/10/16 0818 03/10/16 1128  GLUCAP 123* 112* 139* 107* 118*   Lipid Profile: No results for input(s): CHOL, HDL, LDLCALC, TRIG, CHOLHDL, LDLDIRECT in the last 72 hours. Thyroid Function Tests: No results for input(s): TSH, T4TOTAL, FREET4, T3FREE, THYROIDAB in the last 72 hours. Anemia Panel: No results for input(s): VITAMINB12, FOLATE, FERRITIN, TIBC, IRON, RETICCTPCT in the last 72 hours. Sepsis Labs: No results for input(s): PROCALCITON, LATICACIDVEN in the last 168 hours.  Recent Results (from the past 240 hour(s))  Culture, blood (Routine X 2) w Reflex to ID Panel     Status: None (Preliminary result)   Collection Time: 03/05/16  7:07 PM  Result Value Ref Range Status   Specimen Description BLOOD LEFT HAND  Final   Special Requests BOTTLES DRAWN AEROBIC AND ANAEROBIC 5CC EACH  Final   Culture NO GROWTH 4 DAYS  Final   Report Status PENDING  Incomplete  Culture, blood (Routine X 2) w Reflex to ID Panel     Status: None (Preliminary result)   Collection Time: 03/05/16  7:08 PM  Result Value Ref Range Status   Specimen Description BLOOD RIGHT HAND  Final   Special Requests BOTTLES DRAWN AEROBIC AND ANAEROBIC 5CC EACH  Final   Culture NO GROWTH 4 DAYS  Final   Report Status  PENDING  Incomplete  Culture, Urine     Status: Abnormal   Collection Time: 03/06/16 11:30 AM  Result Value Ref Range Status   Specimen Description URINE, RANDOM  Final   Special Requests NONE  Final   Culture (A)  Final    >=100,000 COLONIES/mL PROTEUS MIRABILIS >=100,000 COLONIES/mL ESCHERICHIA COLI Confirmed Extended Spectrum Beta-Lactamase Producer (ESBL)    Report Status 03/09/2016 FINAL  Final   Organism ID, Bacteria PROTEUS MIRABILIS (A)  Final   Organism ID, Bacteria ESCHERICHIA COLI (A)  Final      Susceptibility   Escherichia coli - MIC*    AMPICILLIN >=32 RESISTANT Resistant     CEFAZOLIN >=64 RESISTANT Resistant     CEFTRIAXONE >=  64 RESISTANT Resistant     CIPROFLOXACIN >=4 RESISTANT Resistant     GENTAMICIN <=1 SENSITIVE Sensitive     IMIPENEM <=0.25 SENSITIVE Sensitive     NITROFURANTOIN <=16 SENSITIVE Sensitive     TRIMETH/SULFA <=20 SENSITIVE Sensitive     AMPICILLIN/SULBACTAM 16 INTERMEDIATE Intermediate     PIP/TAZO <=4 SENSITIVE Sensitive     Extended ESBL POSITIVE Resistant     * >=100,000 COLONIES/mL ESCHERICHIA COLI   Proteus mirabilis - MIC*    AMPICILLIN >=32 RESISTANT Resistant     CEFAZOLIN <=4 SENSITIVE Sensitive     CEFTRIAXONE <=1 SENSITIVE Sensitive     CIPROFLOXACIN >=4 RESISTANT Resistant     GENTAMICIN <=1 SENSITIVE Sensitive     IMIPENEM 4 SENSITIVE Sensitive     NITROFURANTOIN 128 RESISTANT Resistant     TRIMETH/SULFA 160 RESISTANT Resistant     AMPICILLIN/SULBACTAM 4 SENSITIVE Sensitive     PIP/TAZO <=4 SENSITIVE Sensitive     * >=100,000 COLONIES/mL PROTEUS MIRABILIS  Culture, bal-quantitative     Status: Abnormal   Collection Time: 03/07/16  4:08 PM  Result Value Ref Range Status   Specimen Description BRONCHIAL ALVEOLAR LAVAGE  Final   Special Requests NONE  Final   Gram Stain   Final    ABUNDANT WBC PRESENT, PREDOMINANTLY PMN RARE GRAM NEGATIVE RODS RARE GRAM POSITIVE COCCI IN CLUSTERS    Culture (A)  Final    70,000  COLONIES/mL METHICILLIN RESISTANT STAPHYLOCOCCUS AUREUS 50,000 COLONIES/mL PSEUDOMONAS AERUGINOSA    Report Status 03/10/2016 FINAL  Final   Organism ID, Bacteria PSEUDOMONAS AERUGINOSA (A)  Final   Organism ID, Bacteria METHICILLIN RESISTANT STAPHYLOCOCCUS AUREUS (A)  Final      Susceptibility   Methicillin resistant staphylococcus aureus - MIC*    CIPROFLOXACIN >=8 RESISTANT Resistant     ERYTHROMYCIN >=8 RESISTANT Resistant     GENTAMICIN <=0.5 SENSITIVE Sensitive     OXACILLIN >=4 RESISTANT Resistant     TETRACYCLINE <=1 SENSITIVE Sensitive     VANCOMYCIN <=0.5 SENSITIVE Sensitive     TRIMETH/SULFA <=10 SENSITIVE Sensitive     CLINDAMYCIN >=8 RESISTANT Resistant     RIFAMPIN <=0.5 SENSITIVE Sensitive     Inducible Clindamycin NEGATIVE Sensitive     * 70,000 COLONIES/mL METHICILLIN RESISTANT STAPHYLOCOCCUS AUREUS   Pseudomonas aeruginosa - MIC*    CEFTAZIDIME 4 SENSITIVE Sensitive     CIPROFLOXACIN <=0.25 SENSITIVE Sensitive     GENTAMICIN <=1 SENSITIVE Sensitive     IMIPENEM 1 SENSITIVE Sensitive     PIP/TAZO 8 SENSITIVE Sensitive     CEFEPIME 2 SENSITIVE Sensitive     * 50,000 COLONIES/mL PSEUDOMONAS AERUGINOSA       Radiology Studies: No results found.    Scheduled Meds: . chlorhexidine gluconate (MEDLINE KIT)  15 mL Mouth Rinse BID  . enoxaparin (LOVENOX) injection  40 mg Subcutaneous Q24H  . famotidine  20 mg Per Tube BID  . free water  200 mL Per Tube Q6H  . furosemide  40 mg Intravenous Daily  . insulin aspart  2-6 Units Subcutaneous Q4H  . mouth rinse  15 mL Mouth Rinse QID  . meropenem (MERREM) IV  1 g Intravenous Q8H  . potassium chloride  20 mEq Per Tube Q4H  . vancomycin  500 mg Intravenous Q12H   Continuous Infusions: . feeding supplement (VITAL AF 1.2 CAL) 1,000 mL (03/10/16 0700)     LOS: 17 days    Time spent: 30 minutes   Dessa Phi, DO [facility] [email or web address]  Password [email or web address] 03/10/2016, 12:28 PM

## 2016-03-11 LAB — GLUCOSE, CAPILLARY
GLUCOSE-CAPILLARY: 108 mg/dL — AB (ref 65–99)
GLUCOSE-CAPILLARY: 109 mg/dL — AB (ref 65–99)
GLUCOSE-CAPILLARY: 111 mg/dL — AB (ref 65–99)
GLUCOSE-CAPILLARY: 122 mg/dL — AB (ref 65–99)
Glucose-Capillary: 99 mg/dL (ref 65–99)
Glucose-Capillary: 116 mg/dL — ABNORMAL HIGH (ref 65–99)

## 2016-03-11 LAB — CBC WITH DIFFERENTIAL/PLATELET
Basophils Absolute: 0 10*3/uL (ref 0.0–0.1)
Basophils Relative: 0 %
EOS ABS: 0.7 10*3/uL (ref 0.0–0.7)
Eosinophils Relative: 4 %
HCT: 30.9 % — ABNORMAL LOW (ref 36.0–46.0)
HEMOGLOBIN: 9.6 g/dL — AB (ref 12.0–15.0)
LYMPHS PCT: 25 %
Lymphs Abs: 4.1 10*3/uL — ABNORMAL HIGH (ref 0.7–4.0)
MCH: 26.3 pg (ref 26.0–34.0)
MCHC: 31.1 g/dL (ref 30.0–36.0)
MCV: 84.7 fL (ref 78.0–100.0)
Monocytes Absolute: 0.8 10*3/uL (ref 0.1–1.0)
Monocytes Relative: 5 %
NEUTROS PCT: 66 %
Neutro Abs: 10.7 10*3/uL — ABNORMAL HIGH (ref 1.7–7.7)
Platelets: ADEQUATE 10*3/uL (ref 150–400)
RBC: 3.65 MIL/uL — AB (ref 3.87–5.11)
RDW: 17.3 % — ABNORMAL HIGH (ref 11.5–15.5)
WBC: 16.3 10*3/uL — AB (ref 4.0–10.5)

## 2016-03-11 LAB — BASIC METABOLIC PANEL
ANION GAP: 10 (ref 5–15)
BUN: 34 mg/dL — AB (ref 6–20)
CHLORIDE: 104 mmol/L (ref 101–111)
CO2: 29 mmol/L (ref 22–32)
Calcium: 8.9 mg/dL (ref 8.9–10.3)
Creatinine, Ser: 0.79 mg/dL (ref 0.44–1.00)
Glucose, Bld: 107 mg/dL — ABNORMAL HIGH (ref 65–99)
POTASSIUM: 3.9 mmol/L (ref 3.5–5.1)
SODIUM: 143 mmol/L (ref 135–145)

## 2016-03-11 MED ORDER — ALTEPLASE 2 MG IJ SOLR
2.0000 mg | Freq: Once | INTRAMUSCULAR | Status: AC
  Administered 2016-03-11: 2 mg
  Filled 2016-03-11 (×2): qty 2

## 2016-03-11 MED ORDER — FUROSEMIDE 10 MG/ML IJ SOLN
20.0000 mg | Freq: Every day | INTRAMUSCULAR | Status: DC
  Administered 2016-03-12 – 2016-03-24 (×13): 20 mg via INTRAVENOUS
  Filled 2016-03-11 (×13): qty 2

## 2016-03-11 MED ORDER — WHITE PETROLATUM GEL
Status: AC
  Administered 2016-03-11: 10:00:00
  Filled 2016-03-11: qty 1

## 2016-03-11 MED ORDER — ALTEPLASE 2 MG IJ SOLR
2.0000 mg | Freq: Once | INTRAMUSCULAR | Status: AC
  Administered 2016-03-11: 2 mg
  Filled 2016-03-11: qty 2

## 2016-03-11 NOTE — Progress Notes (Signed)
PROGRESS NOTE    [name] [name]  IAX:[id number] DOB: March 02, 1971 DOA: 02/22/2016 PCP: Garwin Brothers, MD     Brief Narrative:  [name] [name] is a 46 year old female with past medical history of frontotemporal dementia (?Crutzfeld Edison Nasuti, but eval in [facility] and thought more consistent with frontotemporal dementia), restless leg syndrome who was admitted on 12/21 secondary to AMS and respiratory distress. She was diagnosed with septic shock, acute respiratory failure, right-sided multilobar pneumonia. She was intubated on 12/21 and failed to wean. She underwent tracheostomy 1/4 and will require vent support at discharge. She remains non-verbal and non-interactive.   Assessment & Plan:   Active Problems:   PNA (pneumonia)   Acute respiratory failure with hypoxia and hypercapnia (HCC)   Pressure injury of skin   Palliative care by specialist   Goals of care, counseling/discussion   Dementia   Endotracheally intubated  Acute hypoxemic respiratory failure -Intubated and unable to wean, tracheostomy placed 1/4 -Will need vent-SNF vs LTAC at discharge, Appreciate SW assistance   Septic shock secondary to multilobar pneumonia, C Diff colitis, ESBL UTI  -Shock resolved, but continues to be septic. WBC improving. Afebrile.   -Blood cultures negative  -BAL with MRSA, pseudomonas --> vanco IV and merrem -Urine culture with proteus mirabilis, ESBL --> merrem  -C Diff --> Vanco PO for 10 days past completion of other antibiotics   Hypernatremia -Free water thru PEG  Frontotemporal dementia -Evaluated by [facility] in 2015, questionable dx Crutzfeld Jacob in chart -Palliative care has been involved in the past, family continues to wish for aggressive medical care    DVT prophylaxis: lovenox Code Status: Full Family Communication:  No family at bedside Disposition Plan: LTAC vs SNF    Consultants:   PCCM   Procedures:  ETT 12/21>>1/4 Trach 1/4 >> Left IJ 12/22 >> Foley >> PEG >> Rectal  tube >>  Antimicrobials:  Zosyn 12/21 - 12/25 Azithro 12/21 - 12/23 Cipro 12/25 - 12/30 Ceftriaxone 1/3 - 1/5 Ceftazidime 1/5 - 1/6 Vanco PO 12/21 >>>  Merrem 1/6 >>>  Vanco IV 1/6 >>>  Subjective: Remains nonverbal, noninteractive. No acute events. BP on the low side   Objective: Vitals:   03/11/16 0500 03/11/16 0600 03/11/16 0731 03/11/16 0748  BP:  100/65 (!) 88/57   Pulse: 90 93 91   Resp: (!) _0 Temp:    99.1 F (37.3 C)  TempSrc:    Oral  SpO2: 100% 100% 100%   Weight:      Height:        Intake/Output Summary (Last 24 hours) at 03/11/16 1038 Last data filed at 03/11/16 0600  Gross per 24 hour  Intake             1670 ml  Output             1605 ml  Net               65 ml   Filed Weights   03/09/16 0600 03/10/16 0309 03/11/16 0438  Weight: 60.5 kg (133 lb 6.1 oz) 61.4 kg (135 lb 5.8 oz) 61.9 kg (136 lb 7.4 oz)    Examination:  General exam: Lethargic, does not respond to voice Respiratory system: Vent-trach. Course breath sounds  Cardiovascular system: S1 & S2 heard, tachycardic, regular. No JVD, murmurs, rubs, gallops or clicks.  Gastrointestinal system: Abdomen is nondistended, soft and nontender. Central nervous system: does not respond to voice  Extremities: Symmetric, contracted  Skin: No  rashes, lesions or ulcers on exposed skin   Data Reviewed: I have personally reviewed following labs and imaging studies  CBC:  Recent Labs Lab 03/06/16 0400 03/07/16 0345 03/10/16 0417 03/11/16 0655  WBC 22.9* 22.6* 18.3* 16.3*  NEUTROABS  --  16.4* 12.4* 10.7*  HGB 10.6* 10.8* 10.4* 9.6*  HCT 33.0* 34.2* 33.4* 30.9*  MCV 82.9 83.2 83.3 84.7  PLT 435* 475* 437* PLATELET CLUMPS NOTED ON SMEAR, COUNT APPEARS ADEQUATE   Basic Metabolic Panel:  Recent Labs Lab 03/06/16 0400 03/07/16 0345 03/08/16 1206 03/10/16 0417 03/11/16 0655  NA 145 146* 147* 142 143  K 3.4* 3.8 4.2 3.3* 3.9  CL 108 109 112* 106 104  CO2 _0 GLUCOSE 132*  121* 105* 124* 107*  BUN 48* 47* 39* 35* 34*  CREATININE 1.26* 1.16* 1.09* 0.82 0.79  CALCIUM 9.3 9.4 9.4 9.0 8.9   GFR: Estimated Creatinine Clearance: 79.1 mL/min (by C-G formula based on SCr of 0.79 mg/dL). Liver Function Tests: No results for input(s): AST, ALT, ALKPHOS, BILITOT, PROT, ALBUMIN in the last 168 hours. No results for input(s): LIPASE, AMYLASE in the last 168 hours. No results for input(s): AMMONIA in the last 168 hours. Coagulation Profile:  Recent Labs Lab 03/06/16 0400  INR 1.13   Cardiac Enzymes: No results for input(s): CKTOTAL, CKMB, CKMBINDEX, TROPONINI in the last 168 hours. BNP (last 3 results) No results for input(s): PROBNP in the last 8760 hours. HbA1C: No results for input(s): HGBA1C in the last 72 hours. CBG:  Recent Labs Lab 03/10/16 1603 03/10/16 1954 03/10/16 2354 03/11/16 0406 03/11/16 0745  GLUCAP 116* 126* 123* 111* 122*   Lipid Profile: No results for input(s): CHOL, HDL, LDLCALC, TRIG, CHOLHDL, LDLDIRECT in the last 72 hours. Thyroid Function Tests: No results for input(s): TSH, T4TOTAL, FREET4, T3FREE, THYROIDAB in the last 72 hours. Anemia Panel: No results for input(s): VITAMINB12, FOLATE, FERRITIN, TIBC, IRON, RETICCTPCT in the last 72 hours. Sepsis Labs: No results for input(s): PROCALCITON, LATICACIDVEN in the last 168 hours.  Recent Results (from the past 240 hour(s))  Culture, blood (Routine X 2) w Reflex to ID Panel     Status: None   Collection Time: 03/05/16  7:07 PM  Result Value Ref Range Status   Specimen Description BLOOD LEFT HAND  Final   Special Requests BOTTLES DRAWN AEROBIC AND ANAEROBIC 5CC EACH  Final   Culture NO GROWTH 5 DAYS  Final   Report Status 03/10/2016 FINAL  Final  Culture, blood (Routine X 2) w Reflex to ID Panel     Status: None   Collection Time: 03/05/16  7:08 PM  Result Value Ref Range Status   Specimen Description BLOOD RIGHT HAND  Final   Special Requests BOTTLES DRAWN AEROBIC AND  ANAEROBIC 5CC EACH  Final   Culture NO GROWTH 5 DAYS  Final   Report Status 03/10/2016 FINAL  Final  Culture, Urine     Status: Abnormal   Collection Time: 03/06/16 11:30 AM  Result Value Ref Range Status   Specimen Description URINE, RANDOM  Final   Special Requests NONE  Final   Culture (A)  Final    >=100,000 COLONIES/mL PROTEUS MIRABILIS >=100,000 COLONIES/mL ESCHERICHIA COLI Confirmed Extended Spectrum Beta-Lactamase Producer (ESBL)    Report Status 03/09/2016 FINAL  Final   Organism ID, Bacteria PROTEUS MIRABILIS (A)  Final   Organism ID, Bacteria ESCHERICHIA COLI (A)  Final      Susceptibility   Escherichia coli -  MIC*    AMPICILLIN >=32 RESISTANT Resistant     CEFAZOLIN >=64 RESISTANT Resistant     CEFTRIAXONE >=64 RESISTANT Resistant     CIPROFLOXACIN >=4 RESISTANT Resistant     GENTAMICIN <=1 SENSITIVE Sensitive     IMIPENEM <=0.25 SENSITIVE Sensitive     NITROFURANTOIN <=16 SENSITIVE Sensitive     TRIMETH/SULFA <=20 SENSITIVE Sensitive     AMPICILLIN/SULBACTAM 16 INTERMEDIATE Intermediate     PIP/TAZO <=4 SENSITIVE Sensitive     Extended ESBL POSITIVE Resistant     * >=100,000 COLONIES/mL ESCHERICHIA COLI   Proteus mirabilis - MIC*    AMPICILLIN >=32 RESISTANT Resistant     CEFAZOLIN <=4 SENSITIVE Sensitive     CEFTRIAXONE <=1 SENSITIVE Sensitive     CIPROFLOXACIN >=4 RESISTANT Resistant     GENTAMICIN <=1 SENSITIVE Sensitive     IMIPENEM 4 SENSITIVE Sensitive     NITROFURANTOIN 128 RESISTANT Resistant     TRIMETH/SULFA 160 RESISTANT Resistant     AMPICILLIN/SULBACTAM 4 SENSITIVE Sensitive     PIP/TAZO <=4 SENSITIVE Sensitive     * >=100,000 COLONIES/mL PROTEUS MIRABILIS  Culture, bal-quantitative     Status: Abnormal   Collection Time: 03/07/16  4:08 PM  Result Value Ref Range Status   Specimen Description BRONCHIAL ALVEOLAR LAVAGE  Final   Special Requests NONE  Final   Gram Stain   Final    ABUNDANT WBC PRESENT, PREDOMINANTLY PMN RARE GRAM NEGATIVE  RODS RARE GRAM POSITIVE COCCI IN CLUSTERS    Culture (A)  Final    70,000 COLONIES/mL METHICILLIN RESISTANT STAPHYLOCOCCUS AUREUS 50,000 COLONIES/mL PSEUDOMONAS AERUGINOSA    Report Status 03/10/2016 FINAL  Final   Organism ID, Bacteria PSEUDOMONAS AERUGINOSA (A)  Final   Organism ID, Bacteria METHICILLIN RESISTANT STAPHYLOCOCCUS AUREUS (A)  Final      Susceptibility   Methicillin resistant staphylococcus aureus - MIC*    CIPROFLOXACIN >=8 RESISTANT Resistant     ERYTHROMYCIN >=8 RESISTANT Resistant     GENTAMICIN <=0.5 SENSITIVE Sensitive     OXACILLIN >=4 RESISTANT Resistant     TETRACYCLINE <=1 SENSITIVE Sensitive     VANCOMYCIN <=0.5 SENSITIVE Sensitive     TRIMETH/SULFA <=10 SENSITIVE Sensitive     CLINDAMYCIN >=8 RESISTANT Resistant     RIFAMPIN <=0.5 SENSITIVE Sensitive     Inducible Clindamycin NEGATIVE Sensitive     * 70,000 COLONIES/mL METHICILLIN RESISTANT STAPHYLOCOCCUS AUREUS   Pseudomonas aeruginosa - MIC*    CEFTAZIDIME 4 SENSITIVE Sensitive     CIPROFLOXACIN <=0.25 SENSITIVE Sensitive     GENTAMICIN <=1 SENSITIVE Sensitive     IMIPENEM 1 SENSITIVE Sensitive     PIP/TAZO 8 SENSITIVE Sensitive     CEFEPIME 2 SENSITIVE Sensitive     * 50,000 COLONIES/mL PSEUDOMONAS AERUGINOSA       Radiology Studies: No results found.    Scheduled Meds: . chlorhexidine gluconate (MEDLINE KIT)  15 mL Mouth Rinse BID  . enoxaparin (LOVENOX) injection  40 mg Subcutaneous Q24H  . famotidine  20 mg Per Tube BID  . free water  200 mL Per Tube Q6H  . [START ON 03/12/2016] furosemide  20 mg Intravenous Daily  . insulin aspart  2-6 Units Subcutaneous Q4H  . mouth rinse  15 mL Mouth Rinse QID  . meropenem (MERREM) IV  1 g Intravenous Q8H  . vancomycin  125 mg Oral QID  . vancomycin  500 mg Intravenous Q12H   Continuous Infusions: . feeding supplement (VITAL AF 1.2 CAL) 1,000 mL (03/11/16 0107)  LOS: 18 days    Time spent: 20 minutes   Dessa Phi, DO [facility] [email or web address] Password [email or web address] 03/11/2016, 10:38 AM

## 2016-03-11 NOTE — Progress Notes (Signed)
Contacted lab and requested morning labs be drawn by lab.  Unable to access blood by safety set or by flushing lines.  Will continue to monitor.

## 2016-03-11 NOTE — Progress Notes (Signed)
CSW followed up on [facility] SNF referrals[facility]  [facility]  [facility]  [facility]- PER JENNIFER IN ADMISSIONS, REFERRAL RECEIVED ON Friday AND HAS BEEN SENT OFF FOR REVIEW THIS MORNING. SHE WILL FOLLOW UP ONCE REVIEWED.   [facility] PER KAREN IN ADMISSIONS, RECEIVED REFERRAL ON Friday AND IS CURRENTLY UNDER CLINICAL REVIEW  [facility]- PER ANGELA IN ADMISSIONS, VENT UNIT HAS NO BEDS AVAILABLE. PATIENT PLACED ON WAITLIST.

## 2016-03-12 ENCOUNTER — Inpatient Hospital Stay (HOSPITAL_COMMUNITY): Payer: Medicare Other

## 2016-03-12 LAB — GLUCOSE, CAPILLARY
Glucose-Capillary: 102 mg/dL — ABNORMAL HIGH (ref 65–99)
Glucose-Capillary: 106 mg/dL — ABNORMAL HIGH (ref 65–99)
Glucose-Capillary: 111 mg/dL — ABNORMAL HIGH (ref 65–99)
Glucose-Capillary: 112 mg/dL — ABNORMAL HIGH (ref 65–99)
Glucose-Capillary: 113 mg/dL — ABNORMAL HIGH (ref 65–99)
Glucose-Capillary: 117 mg/dL — ABNORMAL HIGH (ref 65–99)

## 2016-03-12 LAB — CBC WITH DIFFERENTIAL/PLATELET
BASOS PCT: 0 %
Basophils Absolute: 0 10*3/uL (ref 0.0–0.1)
Eosinophils Absolute: 0.6 10*3/uL (ref 0.0–0.7)
Eosinophils Relative: 4 %
HEMATOCRIT: 30.2 % — AB (ref 36.0–46.0)
HEMOGLOBIN: 9.3 g/dL — AB (ref 12.0–15.0)
LYMPHS ABS: 3.5 10*3/uL (ref 0.7–4.0)
LYMPHS PCT: 24 %
MCH: 25.8 pg — AB (ref 26.0–34.0)
MCHC: 30.8 g/dL (ref 30.0–36.0)
MCV: 83.9 fL (ref 78.0–100.0)
MONOS PCT: 5 %
Monocytes Absolute: 0.8 10*3/uL (ref 0.1–1.0)
NEUTROS ABS: 9.8 10*3/uL — AB (ref 1.7–7.7)
NEUTROS PCT: 67 %
Platelets: 429 10*3/uL — ABNORMAL HIGH (ref 150–400)
RBC: 3.6 MIL/uL — ABNORMAL LOW (ref 3.87–5.11)
RDW: 16.6 % — ABNORMAL HIGH (ref 11.5–15.5)
WBC: 14.7 10*3/uL — ABNORMAL HIGH (ref 4.0–10.5)

## 2016-03-12 LAB — BASIC METABOLIC PANEL
Anion gap: 9 (ref 5–15)
BUN: 31 mg/dL — ABNORMAL HIGH (ref 6–20)
CHLORIDE: 103 mmol/L (ref 101–111)
CO2: 30 mmol/L (ref 22–32)
CREATININE: 0.79 mg/dL (ref 0.44–1.00)
Calcium: 9 mg/dL (ref 8.9–10.3)
GFR calc non Af Amer: >60 mL/min (ref >60)
Glucose, Bld: 115 mg/dL — ABNORMAL HIGH (ref 65–99)
Potassium: 3.4 mmol/L — ABNORMAL LOW (ref 3.5–5.1)
Sodium: 142 mmol/L (ref 135–145)

## 2016-03-12 MED ORDER — POTASSIUM CHLORIDE 20 MEQ PO PACK
40.0000 meq | PACK | Freq: Once | ORAL | Status: AC
  Administered 2016-03-12: 40 meq
  Filled 2016-03-12: qty 2

## 2016-03-12 NOTE — Progress Notes (Signed)
Pharmacy Antibiotic Note  Macarena Langseth is a 46 y.o. female admitted on 02/22/2016 with pneumonia and UTI.  Pharmacy has been consulted for vancomycin and meropenem dosing. Plan: Vancomycin 588m IV Q12H Meropenem 1gm IV Q8H F/u renal fxn, C&S, clinical status and trough at SS  Height: _0  (165.1 cm) Weight: 142 lb 13.7 oz (64.8 kg) IBW/kg (Calculated) : 57  Temp (24hrs), Avg:99 F (37.2 C), Min:98 F (36.7 C), Max:99.9 F (37.7 C)   Recent Labs Lab 03/06/16 0400 03/07/16 0345 03/08/16 1206 03/10/16 0417 03/11/16 0655 03/12/16 0429  WBC 22.9* 22.6*  --  18.3* 16.3* 14.7*  CREATININE 1.26* 1.16* 1.09* 0.82 0.79 0.79    Estimated Creatinine Clearance: 79.1 mL/min (by C-G formula based on SCr of 0.79 mg/dL).    Allergies  Allergen Reactions  . Sulfa Antibiotics Other (See Comments)    Per MAR    Antimicrobials this admission: Vancomycin 12/21>>12/24; 1/6>> Zosyn 12/21>>12/24 Azithromycin 12/21 >> 12/23 PO Vanc 12/21 >>  Cipro 12/25 >>12/30 Ceftriaxone 1/3 >> 1/5 Ceftazidime 1/5 >>1/6 Meropenem 1/6>>  Dose adjustments this admission: N/A  Microbiology results: 12/21BCx: 1/2 GPC clusters (BCID- CONS) 12/21UCx: multiple species, none predominant 12/21 BAL: >100K Pseudomonas; 20K MRSA 12/21 C. Diff Ag positive, toxin negative, PCR positive 12/21 Legionella UAg: ip 12/21 Strep UAg: negative 12/21 RVP: negative 12/21 GI Panel: + Norovirus 12/21 MRSA PCR negative 12/21 Influenza PCR negative 1/3 urine cx: Proteus, E.coli 1/2 blood cx: ngtd 1/4 BAL: 70K staph aureus, 50k pseudomonas  MLevester Fresh PharmD, BCPS, BCCCP Clinical Pharmacist Clinical phone for 03/12/2016 from 7a-3:30p: [phone] after 3:30p, please call main pharmacy at: [phone] 03/12/2016 12:37 PM

## 2016-03-12 NOTE — Progress Notes (Signed)
PROGRESS NOTE    [name] [name]  GXQ:[id number] DOB: 06/10/1970 DOA: 02/22/2016 PCP: Garwin Brothers, MD     Brief Narrative:  [name] [name] is a 46 year old female with past medical history of frontotemporal dementia (?Crutzfeld Edison Nasuti, but evaluated in [facility] and thought more consistent with frontotemporal dementia), restless leg syndrome who was admitted on 12/21 secondary to AMS and respiratory distress. She was diagnosed with septic shock, acute respiratory failure, right-sided multilobar pneumonia. She was intubated on 12/21 and failed to wean. She underwent tracheostomy 1/4 and will require vent support at discharge. She remains non-verbal and non-interactive.   Assessment & Plan:   Active Problems:   PNA (pneumonia)   Acute respiratory failure with hypoxia and hypercapnia (HCC)   Pressure injury of skin   Palliative care by specialist   Goals of care, counseling/discussion   Dementia   Endotracheally intubated  Acute hypoxemic respiratory failure -Intubated and unable to wean, tracheostomy placed 1/4 -Will need vent-SNF at discharge. [facility] a bed [facility] on 03/21/2016 -Trach care   Septic shock secondary to multilobar pneumonia, C Diff colitis, ESBL UTI  -Shock resolved. WBC improving. Afebrile.   -Blood cultures negative  -BAL with MRSA, pseudomonas --> vanco IV and merrem -Urine culture with proteus mirabilis, ESBL --> merrem  -C Diff --> Vanco PO for 10 days past completion of other antibiotics   Frontotemporal dementia -Evaluated by [facility] in 2015, questionable dx Crutzfeld Jacob in chart -Palliative care has been involved in the past, family continues to wish for aggressive medical care    DVT prophylaxis: lovenox Code Status: Full Family Communication:  No family at bedside Disposition Plan: [facility] 03[facility]2016   Consultants:   PCCM   Procedures:  ETT 12/21>>1/4 Trach 1/4 >> Left IJ 12/22 >> Foley >> PEG >> Rectal tube  >>  Antimicrobials:  Zosyn 12/21 - 12/25 Azithro 12/21 - 12/23 Cipro 12/25 - 12/30 Ceftriaxone 1/3 - 1/5 Ceftazidime 1/5 - 1/6 Vanco PO 12/21 >>>  Merrem 1/6 >>>  Vanco IV 1/6 >>>  Subjective: Remains nonverbal, noninteractive. No acute events.   Objective: Vitals:   03/12/16 0800 03/12/16 0803 03/12/16 0900 03/12/16 1000  BP: 112/80  96/68 101/69  Pulse: 87  94 92  Resp: _0 Temp:  98 F (36.7 C)    TempSrc:  Oral    SpO2: 100%  100% 100%  Weight:      Height:        Intake/Output Summary (Last 24 hours) at 03/12/16 1105 Last data filed at 03/12/16 1000  Gross per 24 hour  Intake             2040 ml  Output             2220 ml  Net             -180 ml   Filed Weights   03/10/16 0309 03/11/16 0438 03/12/16 0421  Weight: 61.4 kg (135 lb 5.8 oz) 61.9 kg (136 lb 7.4 oz) 64.8 kg (142 lb 13.7 oz)    Examination:  General exam: Alert but non interactive, does not answer questions  Respiratory system: Vent-trach. Course breath sounds  Cardiovascular system: S1 & S2 heard, RRR. No JVD, murmurs, rubs, gallops or clicks.  Gastrointestinal system: Abdomen is nondistended, soft and nontender. Central nervous system: does not respond to voice  Extremities: Symmetric, contracted  Skin: No rashes, lesions or ulcers on exposed skin   Data Reviewed:  I have personally reviewed following labs and imaging studies  CBC:  Recent Labs Lab 03/06/16 0400 03/07/16 0345 03/10/16 0417 03/11/16 0655 03/12/16 0429  WBC 22.9* 22.6* 18.3* 16.3* 14.7*  NEUTROABS  --  16.4* 12.4* 10.7* 9.8*  HGB 10.6* 10.8* 10.4* 9.6* 9.3*  HCT 33.0* 34.2* 33.4* 30.9* 30.2*  MCV 82.9 83.2 83.3 84.7 83.9  PLT 435* 475* 437* PLATELET CLUMPS NOTED ON SMEAR, COUNT APPEARS ADEQUATE 253*   Basic Metabolic Panel:  Recent Labs Lab 03/07/16 0345 03/08/16 1206 03/10/16 0417 03/11/16 0655 03/12/16 0429  NA 146* 147* 142 143 142  K 3.8 4.2 3.3* 3.9 3.4*  CL 109 112* 106 104 103  CO2 _0 GLUCOSE 121* 105* 124* 107* 115*  BUN 47* 39* 35* 34* 31*  CREATININE 1.16* 1.09* 0.82 0.79 0.79  CALCIUM 9.4 9.4 9.0 8.9 9.0   GFR: Estimated Creatinine Clearance: 79.1 mL/min (by C-G formula based on SCr of 0.79 mg/dL). Liver Function Tests: No results for input(s): AST, ALT, ALKPHOS, BILITOT, PROT, ALBUMIN in the last 168 hours. No results for input(s): LIPASE, AMYLASE in the last 168 hours. No results for input(s): AMMONIA in the last 168 hours. Coagulation Profile:  Recent Labs Lab 03/06/16 0400  INR 1.13   Cardiac Enzymes: No results for input(s): CKTOTAL, CKMB, CKMBINDEX, TROPONINI in the last 168 hours. BNP (last 3 results) No results for input(s): PROBNP in the last 8760 hours. HbA1C: No results for input(s): HGBA1C in the last 72 hours. CBG:  Recent Labs Lab 03/11/16 1532 03/11/16 1953 03/11/16 2331 03/12/16 0337 03/12/16 0758  GLUCAP 116* 108* 109* 117* 111*   Lipid Profile: No results for input(s): CHOL, HDL, LDLCALC, TRIG, CHOLHDL, LDLDIRECT in the last 72 hours. Thyroid Function Tests: No results for input(s): TSH, T4TOTAL, FREET4, T3FREE, THYROIDAB in the last 72 hours. Anemia Panel: No results for input(s): VITAMINB12, FOLATE, FERRITIN, TIBC, IRON, RETICCTPCT in the last 72 hours. Sepsis Labs: No results for input(s): PROCALCITON, LATICACIDVEN in the last 168 hours.  Recent Results (from the past 240 hour(s))  Culture, blood (Routine X 2) w Reflex to ID Panel     Status: None   Collection Time: 03/05/16  7:07 PM  Result Value Ref Range Status   Specimen Description BLOOD LEFT HAND  Final   Special Requests BOTTLES DRAWN AEROBIC AND ANAEROBIC 5CC EACH  Final   Culture NO GROWTH 5 DAYS  Final   Report Status 03/10/2016 FINAL  Final  Culture, blood (Routine X 2) w Reflex to ID Panel     Status: None   Collection Time: 03/05/16  7:08 PM  Result Value Ref Range Status   Specimen Description BLOOD RIGHT HAND  Final   Special Requests BOTTLES  DRAWN AEROBIC AND ANAEROBIC 5CC EACH  Final   Culture NO GROWTH 5 DAYS  Final   Report Status 03/10/2016 FINAL  Final  Culture, Urine     Status: Abnormal   Collection Time: 03/06/16 11:30 AM  Result Value Ref Range Status   Specimen Description URINE, RANDOM  Final   Special Requests NONE  Final   Culture (A)  Final    >=100,000 COLONIES/mL PROTEUS MIRABILIS >=100,000 COLONIES/mL ESCHERICHIA COLI Confirmed Extended Spectrum Beta-Lactamase Producer (ESBL)    Report Status 03/09/2016 FINAL  Final   Organism ID, Bacteria PROTEUS MIRABILIS (A)  Final   Organism ID, Bacteria ESCHERICHIA COLI (A)  Final      Susceptibility   Escherichia coli - MIC*  AMPICILLIN >=32 RESISTANT Resistant     CEFAZOLIN >=64 RESISTANT Resistant     CEFTRIAXONE >=64 RESISTANT Resistant     CIPROFLOXACIN >=4 RESISTANT Resistant     GENTAMICIN <=1 SENSITIVE Sensitive     IMIPENEM <=0.25 SENSITIVE Sensitive     NITROFURANTOIN <=16 SENSITIVE Sensitive     TRIMETH/SULFA <=20 SENSITIVE Sensitive     AMPICILLIN/SULBACTAM 16 INTERMEDIATE Intermediate     PIP/TAZO <=4 SENSITIVE Sensitive     Extended ESBL POSITIVE Resistant     * >=100,000 COLONIES/mL ESCHERICHIA COLI   Proteus mirabilis - MIC*    AMPICILLIN >=32 RESISTANT Resistant     CEFAZOLIN <=4 SENSITIVE Sensitive     CEFTRIAXONE <=1 SENSITIVE Sensitive     CIPROFLOXACIN >=4 RESISTANT Resistant     GENTAMICIN <=1 SENSITIVE Sensitive     IMIPENEM 4 SENSITIVE Sensitive     NITROFURANTOIN 128 RESISTANT Resistant     TRIMETH/SULFA 160 RESISTANT Resistant     AMPICILLIN/SULBACTAM 4 SENSITIVE Sensitive     PIP/TAZO <=4 SENSITIVE Sensitive     * >=100,000 COLONIES/mL PROTEUS MIRABILIS  Culture, bal-quantitative     Status: Abnormal   Collection Time: 03/07/16  4:08 PM  Result Value Ref Range Status   Specimen Description BRONCHIAL ALVEOLAR LAVAGE  Final   Special Requests NONE  Final   Gram Stain   Final    ABUNDANT WBC PRESENT, PREDOMINANTLY PMN RARE  GRAM NEGATIVE RODS RARE GRAM POSITIVE COCCI IN CLUSTERS    Culture (A)  Final    70,000 COLONIES/mL METHICILLIN RESISTANT STAPHYLOCOCCUS AUREUS 50,000 COLONIES/mL PSEUDOMONAS AERUGINOSA    Report Status 03/10/2016 FINAL  Final   Organism ID, Bacteria PSEUDOMONAS AERUGINOSA (A)  Final   Organism ID, Bacteria METHICILLIN RESISTANT STAPHYLOCOCCUS AUREUS (A)  Final      Susceptibility   Methicillin resistant staphylococcus aureus - MIC*    CIPROFLOXACIN >=8 RESISTANT Resistant     ERYTHROMYCIN >=8 RESISTANT Resistant     GENTAMICIN <=0.5 SENSITIVE Sensitive     OXACILLIN >=4 RESISTANT Resistant     TETRACYCLINE <=1 SENSITIVE Sensitive     VANCOMYCIN <=0.5 SENSITIVE Sensitive     TRIMETH/SULFA <=10 SENSITIVE Sensitive     CLINDAMYCIN >=8 RESISTANT Resistant     RIFAMPIN <=0.5 SENSITIVE Sensitive     Inducible Clindamycin NEGATIVE Sensitive     * 70,000 COLONIES/mL METHICILLIN RESISTANT STAPHYLOCOCCUS AUREUS   Pseudomonas aeruginosa - MIC*    CEFTAZIDIME 4 SENSITIVE Sensitive     CIPROFLOXACIN <=0.25 SENSITIVE Sensitive     GENTAMICIN <=1 SENSITIVE Sensitive     IMIPENEM 1 SENSITIVE Sensitive     PIP/TAZO 8 SENSITIVE Sensitive     CEFEPIME 2 SENSITIVE Sensitive     * 50,000 COLONIES/mL PSEUDOMONAS AERUGINOSA       Radiology Studies: No results found.    Scheduled Meds: . chlorhexidine gluconate (MEDLINE KIT)  15 mL Mouth Rinse BID  . enoxaparin (LOVENOX) injection  40 mg Subcutaneous Q24H  . famotidine  20 mg Per Tube BID  . free water  200 mL Per Tube Q6H  . furosemide  20 mg Intravenous Daily  . insulin aspart  2-6 Units Subcutaneous Q4H  . mouth rinse  15 mL Mouth Rinse QID  . meropenem (MERREM) IV  1 g Intravenous Q8H  . vancomycin  125 mg Oral QID  . vancomycin  500 mg Intravenous Q12H   Continuous Infusions: . feeding supplement (VITAL AF 1.2 CAL) 1,000 mL (03/11/16 2206)     LOS: 19 days  Time spent: 20 minutes   Dessa Phi, DO [facility] [email or web address] Password [email or web address] 03/12/2016, 11:05 AM

## 2016-03-12 NOTE — Care Management Note (Signed)
Case Management Note  Patient Details  Name: [name] [name] MRN: [id number] Date of Birth: 1970/03/10  Subjective/Objective: Pt admitted on 02/22/16 with acute respiratory failure, multilobar PNA.  PTA, pt resided at [facility].  Pt's POA is her sister, [name] [name].                     Action/Plan: Pt remains on ventilator, and is not weaning.  Per MD, pt will not be able to wean from ventilator, and sister is considering tracheostomy.  Palliative Medicine Team to meet with sister on 02/28/16 at 2pm for Goals of Care meeting.  Will consult CSW to facilitate return to SNF/possible vent SNF, should sister decide that she wants trach.    Expected Discharge Date:                  Expected Discharge Plan:  Skilled Nursing Facility  In-House Referral:  Clinical Social Work  Discharge planning Services  CM Consult  Post Acute Care Choice:    Choice offered to:     DME Arranged:    DME Agency:     HH Arranged:    [place] Agency:     Status of Service:  In process, will continue to follow  If discussed at Long Length of Stay Meetings, dates discussed:    Additional Comments: 03/12/2016  Discussed in LOS 03/12/16 - remains appropriate for continued stay.  Discharge plan continues to be SNF - CSW following  03/07/16 CM spoke in depth with pts sister 03/06/16 regarding pt will most likely be placed in a SNF out of state at discharge due to pt not being appropriate for LTAC per physician advisor.  Sister relayed that she interested in sister discharging to vent SNF in [place] following up with sister regarding viable SNF options.  CM acknowledged request for facility in [place] but did reiterate to sister that the chances of pt discharging instate with vent would be unlikely - sister wishes to continue to move forward with trach.  Discussed in LOS 03/07/16 - pt remains appropriate for continued stay.  03/05/16 Discussed in LOS 03/05/16 - pt remains appropriate for  continued stay  (continous mechanical ventilation) Palliative eval completed - family wishes to move forward with trach and peg with the understanding that SNF placement will be out of the state.   03/01/16 [name], RN, BSN [phone] Palliative met with pt family; plan on moving forward with trach.  Family made aware that pt will likely be placed out of state at discharge.  CM will continue to follow for discharge needs  [name], RN 03/12/2016, 3:50 PM

## 2016-03-12 NOTE — Progress Notes (Signed)
PULMONARY / CRITICAL CARE MEDICINE   Name: [name] SchmidtChevella Kirks MRN: [id number] DOB: 02/13/1971    ADMISSION DATE:  02/22/2016 CONSULTATION DATE:  02/22/16  REFERRING MD:  Baxter HireKristen Ward MD  CHIEF COMPLAINT:  Acute respiratory failure, Multilobar PNA  HISTORY OF PRESENT ILLNESS:   [name] [name] is a 46 year old with PMHx of frontotemporal dementia, restless leg syndrome who was admitted on 12/21 with septic shock, acute respiratory failure and R multilobar PNA who underwent tracheostomy on 1/4 and has remained vent dependent since then.   SUBJECTIVE:  No acute events overnight.  Remains on full vent support.   VITAL SIGNS: BP 108/77   Pulse (!) 103   Temp 98 F (36.7 C) (Oral)   Resp 13   Ht 5\' 5"  (1.651 m)   Wt 64.8 kg (142 lb 13.7 oz)   SpO2 100%   BMI 23.77 kg/m   HEMODYNAMICS:    VENTILATOR SETTINGS: Vent Mode: PRVC FiO2 (%):  [40 %] 40 % Set Rate:  [12 bmp] 12 bmp Vt Set:  [400 mL] 400 mL PEEP:  [5 cmH20] 5 cmH20 Plateau Pressure:  [17 cmH20-21 cmH20] 17 cmH20  INTAKE / OUTPUT: I/O last 3 completed shifts: In: 2890 [I.V.:340; NG/GT:1850; IV Piggyback:700] Out: 2585 [Urine:2535; Stool:50]  PHYSICAL EXAMINATION:  General:  Chronically ill appearing, NAD Neuro: mouths words intermittently but doesn't follow commands, eyes open HEENT: PERRL, Trach clean Cardiovascular:  RRR, no mgr Lungs: Crackles bases bilaterally, vent supported breaths Abdomen: Non tender, non distended, + BS Musculoskeletal:  Contracted upper extremities bilaterally  LABS:  BMET  Recent Labs Lab 03/10/16 0417 03/11/16 0655 03/12/16 0429  NA 142 143 142  K 3.3* 3.9 3.4*  CL 106 104 103  CO2 25 29 30   BUN 35* 34* 31*  CREATININE 0.82 0.79 0.79  GLUCOSE 124* 107* 115*    Electrolytes  Recent Labs Lab 03/10/16 0417 03/11/16 0655 03/12/16 0429  CALCIUM 9.0 8.9 9.0    CBC  Recent Labs Lab 03/10/16 0417 03/11/16 0655 03/12/16 0429  WBC 18.3* 16.3* 14.7*  HGB 10.4* 9.6*  9.3*  HCT 33.4* 30.9* 30.2*  PLT 437* PLATELET CLUMPS NOTED ON SMEAR, COUNT APPEARS ADEQUATE 429*    Coag's  Recent Labs Lab 03/06/16 0400  APTT 31  INR 1.13    Sepsis Markers No results for input(s): LATICACIDVEN, PROCALCITON, O2SATVEN in the last 168 hours.  ABG  Recent Labs Lab 03/08/16 1000  PHART 7.511*  PCO2ART 29.3*  PO2ART 119*    Liver Enzymes No results for input(s): AST, ALT, ALKPHOS, BILITOT, ALBUMIN in the last 168 hours.  Cardiac Enzymes No results for input(s): TROPONINI, PROBNP in the last 168 hours.  Glucose  Recent Labs Lab 03/11/16 1142 03/11/16 1532 03/11/16 1953 03/11/16 2331 03/12/16 0337 03/12/16 0758  GLUCAP 99 116* 108* 109* 117* 111*    Imaging No results found.   STUDIES:  CT chest 12/21 > Complete right upper lobe consolidation and significant volume loss with occlusion of the right upper lobe bronchus by low-attenuation material, favoring mucous plugging. No discrete obstructing mass. 3. Moderate patchy consolidation and ground-glass attenuation throughout the right middle lobe, lingula and bilateral lower lobes, favoring multilobar pneumonia and/or aspiration 12/21 bronchoscopy >> mucus plugs Rt CXR 1/4 >> Persistent left lower lobe atelectasis or pneumonia somewhat improved since the previous study. Probable small left pleural effusion. The support devices are in stable position. CXR 1/4 >> Tracheostomy tube in good anatomic position. Left IJ line in good anatomic position. Low lung volumes.  Persistent left base infiltrate. No change from prior exam.  CULTURES: Bcx 12/21 >> coag neg staph 1/2 Sputum Cx 12/21 > pseudomonas pan-S, MRSA Stool norovirus + Stool c diff Ag positive, toxin negative  RVP >> negative  Urine strep ag neg Urine legionella neg BCx 1/2 >> NGTD x 2 days UCx >> Gram Negative Rods >proteus, e coli, ESBL resp culture 1/4: Pseudamonas, MRSA  ANTIBIOTICS: Zosyn 12/21 >> 12/25 Azithro 12/21 >>  12/23 Cipro 12/25 >> 12/30 Vanco 12/21 >> to complete today Ceftriaxone 1/3 >>  SIGNIFICANT EVENTS: 12/21 > Admitted with respiratory failure  LINES/TUBES: ETT 12/21>>1/4 Trach 1/4 (df)>> Left IJ 12/22 >> Foley >> PEG >>  DISCUSSION: [name] is a 46 year old NHR with PMHx of frontotemporal dementia, restless leg syndrome who was admitted with septic shock, acute respiratory failure and R multilobar PNA who remains intubated as she continues to fail SBTs. Janina Mayo was placed on 1/4.  Continues to have respiratory failure in setting of ongoing HCAP from MRSA and pseudomonas.  ASSESSMENT / PLAN:  PULMONARY A: Acute Hypercapnic, Hypoxic Respiratory Failure s/p Trach Placement 1/4 > persistent respiratory failure, not weaning from vent MRSA pneumonia Pseudomonas pneumonia Tracheostomy status P:   Daily Pressure support trials, full vent support at night Trach care per routine CXR now> given persistent respiratory failure Will need long term vent placement per family wishes Continue antibiotics as you are doing   Heber Tower, MD  PCCM Pager: [phone] Cell: [phone] After 3pm or if no response, call [phone]  03/12/2016 11:25 AM

## 2016-03-12 NOTE — Progress Notes (Signed)
[facility] per Clydie BraunKaren in admissions [phone]). Patient can arrive on next Thursday 03/21/2016. Prior to Patient arrival, CSW to send facility last 24 hrs of vent/respiratory settings/data. Facility requests both physical hard copies of scripts as well as faxed copy of scripts as well. CSW to continue to follow for disposition.     Enos FlingAshley Addley Ballinger, MSW, LCSW [facility] Clinical Social Worker [phone]

## 2016-03-13 DIAGNOSIS — Z7189 Other specified counseling: Secondary | ICD-10-CM

## 2016-03-13 LAB — GLUCOSE, CAPILLARY
GLUCOSE-CAPILLARY: 112 mg/dL — AB (ref 65–99)
GLUCOSE-CAPILLARY: 116 mg/dL — AB (ref 65–99)
GLUCOSE-CAPILLARY: 117 mg/dL — AB (ref 65–99)
GLUCOSE-CAPILLARY: 117 mg/dL — AB (ref 65–99)
Glucose-Capillary: 129 mg/dL — ABNORMAL HIGH (ref 65–99)
Glucose-Capillary: 102 mg/dL — ABNORMAL HIGH (ref 65–99)

## 2016-03-13 LAB — CBC WITH DIFFERENTIAL/PLATELET
BASOS ABS: 0 10*3/uL (ref 0.0–0.1)
BASOS PCT: 0 %
EOS PCT: 5 %
Eosinophils Absolute: 0.7 10*3/uL (ref 0.0–0.7)
HCT: 29.7 % — ABNORMAL LOW (ref 36.0–46.0)
Hemoglobin: 9.2 g/dL — ABNORMAL LOW (ref 12.0–15.0)
LYMPHS PCT: 27 %
Lymphs Abs: 4 10*3/uL (ref 0.7–4.0)
MCH: 26.4 pg (ref 26.0–34.0)
MCHC: 31 g/dL (ref 30.0–36.0)
MCV: 85.1 fL (ref 78.0–100.0)
MONO ABS: 0.8 10*3/uL (ref 0.1–1.0)
Monocytes Relative: 6 %
Neutro Abs: 9.2 10*3/uL — ABNORMAL HIGH (ref 1.7–7.7)
Neutrophils Relative %: 62 %
PLATELETS: 399 10*3/uL (ref 150–400)
RBC: 3.49 MIL/uL — ABNORMAL LOW (ref 3.87–5.11)
RDW: 17 % — AB (ref 11.5–15.5)
WBC: 14.7 10*3/uL — ABNORMAL HIGH (ref 4.0–10.5)

## 2016-03-13 LAB — BASIC METABOLIC PANEL
ANION GAP: 7 (ref 5–15)
BUN: 27 mg/dL — ABNORMAL HIGH (ref 6–20)
CO2: 31 mmol/L (ref 22–32)
CREATININE: 0.71 mg/dL (ref 0.44–1.00)
Calcium: 9.1 mg/dL (ref 8.9–10.3)
Chloride: 105 mmol/L (ref 101–111)
GFR calc Af Amer: >60 mL/min (ref >60)
GLUCOSE: 113 mg/dL — AB (ref 65–99)
Potassium: 3.6 mmol/L (ref 3.5–5.1)
Sodium: 143 mmol/L (ref 135–145)

## 2016-03-13 NOTE — Progress Notes (Signed)
CSW followed up on Vent SNF referrals:   [facility]  [facility]- Per Debroah LoopKaren Ferguson in admissions, no vent beds available at this time.   [facility]  [facility]- Per [place] in admissions, Patient declined due to new trach  [facility]- [facility] per Clydie BraunKaren in admissions [phone]). Patient can arrive on next Thursday 03/21/2016.  [facility]- Per Marylene LandAngela in admissions, no vent beds at this time. Patient remains on waitlist. Marylene Landngela to call CSW back this afternoon for further update.    Enos FlingAshley Doniel Maiello, MSW, LCSW [facility] Clinical Social Worker [phone]

## 2016-03-13 NOTE — Progress Notes (Signed)
PROGRESS NOTE        PATIENT DETAILS Name: [name] [name] Age: 46 y.o. Sex: female Date of Birth: [birth year] Admit Date: 02/22/2016 Admitting Physician Marshell Garfinkel, MD XIP:JASN, SAAD, MD  Brief Narrative: Patient is a 46 y.o. female with past medical history of frontotemporal dementia of an resident of a skilled nursing facility-apparently contracted and aphasic at baseline-brought to the hospital on 02/22/16 with acute respiratory failure from multilobar pneumonia. She was intubated on admission, and was difficult to wean and subsequently required a tracheostomy on 1/4. She is still ventilator dependent. Hospital course was complicated by development of multiple electrolyte abnormalities, and possible C. difficile colitis. See below for further details  Subjective: Aphasic-does not follow commands.  Assessment/Plan: Acute hypoxemic respiratory failure: Secondary to pneumonia.Intubated and unable to wean, tracheostomy placed 1/. [facility] M following for vent/trach care. Spoke with social work-needs vent-SNF at discharge, social work looking for numerous facilities, but as of now has a bed at [facility] on 03/21/2016  Septic shock secondary to multilobar pneumonia, C Diff colitis, ESBL UTI: Sepsis pathophysiology has resolved. Leukocytosis is now downtrending, she is afebrile. Her blood cultures on 1/2 are negative, BAL culture on 1/4 positive for Pseudomonas and MRSA, urine culture positive for ESBL Escherichia coli and Proteus. Remains on empiric vancomycin and meropenem. Furthermore, hospital course was complicated by diarrhea-thought to be due to a combination of normal virus and C. difficile colitis and remains on vancomycin through the PEG tube.  Anemia: Likely secondary to acute illness, follow.  Chronic dysphagia: Has a PEG tube in place.  History of frontotemporal dementia: Reviewed H&P-contracted, not verbal and bedbound at discharge. Resident of  SNF.  Palliative care/goals of care: Very poor prognoses-seen by palliative care during this hospital stay, family still desires full aggressive care in spite of poor overall prognoses.  DVT Prophylaxis: Prophylactic Lovenox   Code Status: Full code   Family Communication: None at bedside-we will reach out to  her to family over the next few days  Disposition Plan: Remain inpatient-SNF on discharge-when bed available. Per SW-not a LTACH candidate  Antimicrobial agents: Anti-infectives    Start     Dose/Rate Route Frequency Ordered Stop   03/10/16 1400  vancomycin (VANCOCIN) 50 mg/mL oral solution 125 mg     125 mg Oral 4 times daily 03/10/16 1230     03/10/16 0400  vancomycin (VANCOCIN) 500 mg in sodium chloride 0.9 % 100 mL IVPB     500 mg 100 mL/hr over 60 Minutes Intravenous Every 12 hours 03/09/16 1524     03/09/16 1600  meropenem (MERREM) 1 g in sodium chloride 0.9 % 100 mL IVPB     1 g 200 mL/hr over 30 Minutes Intravenous Every 8 hours 03/09/16 1524     03/09/16 1600  vancomycin (VANCOCIN) IVPB 1000 mg/200 mL premix     1,000 mg 200 mL/hr over 60 Minutes Intravenous  Once 03/09/16 1524 03/09/16 1748   03/08/16 1100  cefTAZidime (FORTAZ) 1 g in dextrose 5 % 50 mL IVPB  Status:  Discontinued     1 g 100 mL/hr over 30 Minutes Intravenous Every 12 hours 03/08/16 0945 03/09/16 1511   03/07/16 1400  vancomycin (VANCOCIN) 50 mg/mL oral solution 125 mg  Status:  Discontinued     125 mg Oral 4 times daily 03/07/16 1043 03/09/16 1518  03/06/16 1300  cefTRIAXone (ROCEPHIN) 1 g in dextrose 5 % 50 mL IVPB  Status:  Discontinued     1 g 100 mL/hr over 30 Minutes Intravenous Every 24 hours 03/06/16 1158 03/08/16 0945   02/26/16 1700  vancomycin (VANCOCIN) IVPB 1000 mg/200 mL premix  Status:  Discontinued     1,000 mg 200 mL/hr over 60 Minutes Intravenous Every 8 hours 02/26/16 1026 02/26/16 1028   02/26/16 1030  ciprofloxacin (CIPRO) IVPB 400 mg  Status:  Discontinued     400  mg 200 mL/hr over 60 Minutes Intravenous Every 12 hours 02/26/16 1026 03/02/16 0949   02/25/16 1930  vancomycin (VANCOCIN) IVPB 1000 mg/200 mL premix  Status:  Discontinued     1,000 mg 200 mL/hr over 60 Minutes Intravenous Every 8 hours 02/25/16 1858 02/26/16 1017   02/24/16 1915  vancomycin (VANCOCIN) IVPB 1000 mg/200 mL premix  Status:  Discontinued     1,000 mg 200 mL/hr over 60 Minutes Intravenous Every 8 hours 02/24/16 1106 02/25/16 0927   02/24/16 1115  vancomycin (VANCOCIN) 1,250 mg in sodium chloride 0.9 % 250 mL IVPB     1,250 mg 166.7 mL/hr over 90 Minutes Intravenous  Once 02/24/16 1106 02/24/16 1430   02/22/16 1800  vancomycin (VANCOCIN) IVPB 750 mg/150 ml premix  Status:  Discontinued     750 mg 150 mL/hr over 60 Minutes Intravenous Every 8 hours 02/22/16 0949 02/24/16 1106   02/22/16 1430  vancomycin (VANCOCIN) 50 mg/mL oral solution 125 mg     125 mg Oral 4 times daily 02/22/16 1420 03/07/16 1359   02/22/16 1400  piperacillin-tazobactam (ZOSYN) IVPB 3.375 g  Status:  Discontinued     3.375 g 12.5 mL/hr over 240 Minutes Intravenous Every 8 hours 02/22/16 0949 02/26/16 0955   02/22/16 1030  vancomycin (VANCOCIN) 500 mg in sodium chloride 0.9 % 100 mL IVPB     500 mg 100 mL/hr over 60 Minutes Intravenous  Once 02/22/16 0949 02/22/16 1212   02/22/16 0900  azithromycin (ZITHROMAX) 500 mg in dextrose 5 % 250 mL IVPB  Status:  Discontinued     500 mg 250 mL/hr over 60 Minutes Intravenous Every 24 hours 02/22/16 0846 02/24/16 0859   02/22/16 0615  piperacillin-tazobactam (ZOSYN) IVPB 3.375 g     3.375 g 100 mL/hr over 30 Minutes Intravenous  Once 02/22/16 0601 02/22/16 0708   02/22/16 0615  vancomycin (VANCOCIN) IVPB 1000 mg/200 mL premix     1,000 mg 200 mL/hr over 60 Minutes Intravenous  Once 02/22/16 0601 02/22/16 0748      Procedures: ETT 12/21>>1/4 Trach 1/4 >> Left IJ 12/22 >> Foley >> PEG >> Rectal tube >>  CONSULTS: PCCM  Palliative Care  Time spent: 25-  minutes-Greater than 50% of this time was spent in counseling, explanation of diagnosis, planning of further management, and coordination of care.  MEDICATIONS: Scheduled Meds: . chlorhexidine gluconate (MEDLINE KIT)  15 mL Mouth Rinse BID  . enoxaparin (LOVENOX) injection  40 mg Subcutaneous Q24H  . famotidine  20 mg Per Tube BID  . free water  200 mL Per Tube Q6H  . furosemide  20 mg Intravenous Daily  . insulin aspart  2-6 Units Subcutaneous Q4H  . mouth rinse  15 mL Mouth Rinse QID  . meropenem (MERREM) IV  1 g Intravenous Q8H  . vancomycin  125 mg Oral QID  . vancomycin  500 mg Intravenous Q12H   Continuous Infusions: . feeding supplement (VITAL AF 1.2 CAL)  1,000 mL (03/12/16 1943)   PRN Meds:.sodium chloride, acetaminophen, albuterol, ondansetron (ZOFRAN) IV   PHYSICAL EXAM: Vital signs: Vitals:   03/13/16 0700 03/13/16 0800 03/13/16 0817 03/13/16 0906  BP: (!) 84/64 (!) 86/65  (!) 88/59  Pulse: 89 88  88  Resp: _0 Temp:   98.7 F (37.1 C)   TempSrc:   Oral   SpO2: 100% 100%  100%  Weight:      Height:       Filed Weights   03/11/16 0438 03/12/16 0421 03/13/16 0431  Weight: 61.9 kg (136 lb 7.4 oz) 64.8 kg (142 lb 13.7 oz) 64.8 kg (142 lb 13.7 oz)   Body mass index is 23.77 kg/m.   General exam: Lethargic, does not respond to voice. Not following any commands Respiratory system: Vent-trach. Course breath sounds  Cardiovascular system: S1 & S2 heard, tachycardic, regular. No JVD, murmurs, rubs, gallops or clicks.  Gastrointestinal system: Abdomen is nondistended, soft and nontender.Peg tube in pace Central nervous system: does not respond to voice  Extremities: Symmetric, contracted at knees, b/l wrist drop Skin: No rashes, lesions or ulcers on exposed skin    I have personally reviewed following labs and imaging studies  LABORATORY DATA: CBC:  Recent Labs Lab 03/07/16 0345 03/10/16 0417 03/11/16 0655 03/12/16 0429 03/13/16 0438  WBC 22.6*  18.3* 16.3* 14.7* 14.7*  NEUTROABS 16.4* 12.4* 10.7* 9.8* 9.2*  HGB 10.8* 10.4* 9.6* 9.3* 9.2*  HCT 34.2* 33.4* 30.9* 30.2* 29.7*  MCV 83.2 83.3 84.7 83.9 85.1  PLT 475* 437* PLATELET CLUMPS NOTED ON SMEAR, COUNT APPEARS ADEQUATE 429* 341    Basic Metabolic Panel:  Recent Labs Lab 03/08/16 1206 03/10/16 0417 03/11/16 0655 03/12/16 0429 03/13/16 0438  NA 147* 142 143 142 143  K 4.2 3.3* 3.9 3.4* 3.6  CL 112* 106 104 103 105  CO2 _1 GLUCOSE 105* 124* 107* 115* 113*  BUN 39* 35* 34* 31* 27*  CREATININE 1.09* 0.82 0.79 0.79 0.71  CALCIUM 9.4 9.0 8.9 9.0 9.1    GFR: Estimated Creatinine Clearance: 79.1 mL/min (by C-G formula based on SCr of 0.71 mg/dL).  Liver Function Tests: No results for input(s): AST, ALT, ALKPHOS, BILITOT, PROT, ALBUMIN in the last 168 hours. No results for input(s): LIPASE, AMYLASE in the last 168 hours. No results for input(s): AMMONIA in the last 168 hours.  Coagulation Profile: No results for input(s): INR, PROTIME in the last 168 hours.  Cardiac Enzymes: No results for input(s): CKTOTAL, CKMB, CKMBINDEX, TROPONINI in the last 168 hours.  BNP (last 3 results) No results for input(s): PROBNP in the last 8760 hours.  HbA1C: No results for input(s): HGBA1C in the last 72 hours.  CBG:  Recent Labs Lab 03/12/16 1547 03/12/16 2009 03/12/16 2356 03/13/16 0400 03/13/16 0815  GLUCAP 113* 102* 106* 112* 117*    Lipid Profile: No results for input(s): CHOL, HDL, LDLCALC, TRIG, CHOLHDL, LDLDIRECT in the last 72 hours.  Thyroid Function Tests: No results for input(s): TSH, T4TOTAL, FREET4, T3FREE, THYROIDAB in the last 72 hours.  Anemia Panel: No results for input(s): VITAMINB12, FOLATE, FERRITIN, TIBC, IRON, RETICCTPCT in the last 72 hours.  Urine analysis:    Component Value Date/Time   COLORURINE YELLOW 03/05/2016 1725   APPEARANCEUR HAZY (A) 03/05/2016 1725   LABSPEC 1.019 03/05/2016 1725   PHURINE 5.0 03/05/2016 1725     GLUCOSEU NEGATIVE 03/05/2016 1725   HGBUR SMALL (A) 03/05/2016 1725  BILIRUBINUR NEGATIVE 03/05/2016 [place] 03/05/2016 1725   PROTEINUR 100 (A) 03/05/2016 1725   UROBILINOGEN 1.0 10/17/2013 1957   NITRITE NEGATIVE 03/05/2016 1725   LEUKOCYTESUR LARGE (A) 03/05/2016 1725    Sepsis Labs: Lactic Acid, Venous    Component Value Date/Time   LATICACIDVEN 2.9 (HH) 02/22/2016 1539    MICROBIOLOGY: Recent Results (from the past 240 hour(s))  Culture, blood (Routine X 2) w Reflex to ID Panel     Status: None   Collection Time: 03/05/16  7:07 PM  Result Value Ref Range Status   Specimen Description BLOOD LEFT HAND  Final   Special Requests BOTTLES DRAWN AEROBIC AND ANAEROBIC 5CC EACH  Final   Culture NO GROWTH 5 DAYS  Final   Report Status 03/10/2016 FINAL  Final  Culture, blood (Routine X 2) w Reflex to ID Panel     Status: None   Collection Time: 03/05/16  7:08 PM  Result Value Ref Range Status   Specimen Description BLOOD RIGHT HAND  Final   Special Requests BOTTLES DRAWN AEROBIC AND ANAEROBIC 5CC EACH  Final   Culture NO GROWTH 5 DAYS  Final   Report Status 03/10/2016 FINAL  Final  Culture, Urine     Status: Abnormal   Collection Time: 03/06/16 11:30 AM  Result Value Ref Range Status   Specimen Description URINE, RANDOM  Final   Special Requests NONE  Final   Culture (A)  Final    >=100,000 COLONIES/mL PROTEUS MIRABILIS >=100,000 COLONIES/mL ESCHERICHIA COLI Confirmed Extended Spectrum Beta-Lactamase Producer (ESBL)    Report Status 03/09/2016 FINAL  Final   Organism ID, Bacteria PROTEUS MIRABILIS (A)  Final   Organism ID, Bacteria ESCHERICHIA COLI (A)  Final      Susceptibility   Escherichia coli - MIC*    AMPICILLIN >=32 RESISTANT Resistant     CEFAZOLIN >=64 RESISTANT Resistant     CEFTRIAXONE >=64 RESISTANT Resistant     CIPROFLOXACIN >=4 RESISTANT Resistant     GENTAMICIN <=1 SENSITIVE Sensitive     IMIPENEM <=0.25 SENSITIVE Sensitive      NITROFURANTOIN <=16 SENSITIVE Sensitive     TRIMETH/SULFA <=20 SENSITIVE Sensitive     AMPICILLIN/SULBACTAM 16 INTERMEDIATE Intermediate     PIP/TAZO <=4 SENSITIVE Sensitive     Extended ESBL POSITIVE Resistant     * >=100,000 COLONIES/mL ESCHERICHIA COLI   Proteus mirabilis - MIC*    AMPICILLIN >=32 RESISTANT Resistant     CEFAZOLIN <=4 SENSITIVE Sensitive     CEFTRIAXONE <=1 SENSITIVE Sensitive     CIPROFLOXACIN >=4 RESISTANT Resistant     GENTAMICIN <=1 SENSITIVE Sensitive     IMIPENEM 4 SENSITIVE Sensitive     NITROFURANTOIN 128 RESISTANT Resistant     TRIMETH/SULFA 160 RESISTANT Resistant     AMPICILLIN/SULBACTAM 4 SENSITIVE Sensitive     PIP/TAZO <=4 SENSITIVE Sensitive     * >=100,000 COLONIES/mL PROTEUS MIRABILIS  Culture, bal-quantitative     Status: Abnormal   Collection Time: 03/07/16  4:08 PM  Result Value Ref Range Status   Specimen Description BRONCHIAL ALVEOLAR LAVAGE  Final   Special Requests NONE  Final   Gram Stain   Final    ABUNDANT WBC PRESENT, PREDOMINANTLY PMN RARE GRAM NEGATIVE RODS RARE GRAM POSITIVE COCCI IN CLUSTERS    Culture (A)  Final    70,000 COLONIES/mL METHICILLIN RESISTANT STAPHYLOCOCCUS AUREUS 50,000 COLONIES/mL PSEUDOMONAS AERUGINOSA    Report Status 03/10/2016 FINAL  Final   Organism ID, Bacteria PSEUDOMONAS AERUGINOSA (  A)  Final   Organism ID, Bacteria METHICILLIN RESISTANT STAPHYLOCOCCUS AUREUS (A)  Final      Susceptibility   Methicillin resistant staphylococcus aureus - MIC*    CIPROFLOXACIN >=8 RESISTANT Resistant     ERYTHROMYCIN >=8 RESISTANT Resistant     GENTAMICIN <=0.5 SENSITIVE Sensitive     OXACILLIN >=4 RESISTANT Resistant     TETRACYCLINE <=1 SENSITIVE Sensitive     VANCOMYCIN <=0.5 SENSITIVE Sensitive     TRIMETH/SULFA <=10 SENSITIVE Sensitive     CLINDAMYCIN >=8 RESISTANT Resistant     RIFAMPIN <=0.5 SENSITIVE Sensitive     Inducible Clindamycin NEGATIVE Sensitive     * 70,000 COLONIES/mL METHICILLIN RESISTANT  STAPHYLOCOCCUS AUREUS   Pseudomonas aeruginosa - MIC*    CEFTAZIDIME 4 SENSITIVE Sensitive     CIPROFLOXACIN <=0.25 SENSITIVE Sensitive     GENTAMICIN <=1 SENSITIVE Sensitive     IMIPENEM 1 SENSITIVE Sensitive     PIP/TAZO 8 SENSITIVE Sensitive     CEFEPIME 2 SENSITIVE Sensitive     * 50,000 COLONIES/mL PSEUDOMONAS AERUGINOSA    RADIOLOGY STUDIES/RESULTS: Ct Chest W Contrast  Result Date: 02/22/2016 CLINICAL DATA:  46 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 Code sepsis. EXAM: CT CHEST WITH CONTRAST TECHNIQUE: Multidetector CT imaging of the chest was performed during intravenous contrast administration. CONTRAST:  75 cc ISOVUE-300 IOPAMIDOL (ISOVUE-300) INJECTION 61% COMPARISON:  Chest radiograph from earlier today. FINDINGS: Cardiovascular: Normal heart size. No significant pericardial fluid/thickening. Great vessels are normal in course and caliber. No central pulmonary emboli. Mediastinum/Nodes: No discrete thyroid nodules. Unremarkable esophagus. No pathologically enlarged axillary, mediastinal or hilar lymph nodes. Lungs/Pleura: No pneumothorax. No pleural effusion. Endotracheal tube tip is 2.8 cm above the carina. There is complete consolidation and severe volume loss in the right upper lobe. The right upper lobe bronchus is occluded with low attenuation material favoring mucous plugging. No discrete obstructing mass. There is moderate patchy consolidation and ground-glass attenuation throughout the right middle lobe, lingula and bilateral lower lobes (right greater than left), with associated mild volume loss in the lower lobes. No significant pulmonary nodules in the limited aerated portions of the lungs. Upper abdomen: Enteric tube terminates in the gastric fundus. Musculoskeletal:  No aggressive appearing focal osseous lesions. IMPRESSION: 1. Well-positioned endotracheal and enteric tubes. 2. Complete right  upper lobe consolidation and significant volume loss with occlusion of the right upper lobe bronchus by low-attenuation material, favoring mucous plugging. No discrete obstructing mass. 3. Moderate patchy consolidation and ground-glass attenuation throughout the right middle lobe, lingula and bilateral lower lobes, favoring multilobar pneumonia and/or aspiration. Electronically Signed   By: Ilona Sorrel M.D.   On: 02/22/2016 10:41   Ir Fluoro Guide Cv Line Left  Result Date: 02/23/2016 INDICATION: 46 year old female with multi lobar pneumonia. EXAM: IR LEFT FLOURO GUIDE CV LINE; IR ULTRASOUND GUIDANCE VASC ACCESS LEFT MEDICATIONS: None ANESTHESIA/SEDATION: None FLUOROSCOPY TIME:  Fluoroscopy Time: 0 minutes 6 seconds (1 mGy). COMPLICATIONS: None PROCEDURE: Informed written consent was obtained from the patient's family after a thorough discussion of the procedural risks, benefits and alternatives. All questions were addressed. Maximal Sterile Barrier Technique was utilized including caps, mask, sterile gowns, sterile gloves, sterile drape, hand hygiene and skin antiseptic. A timeout was performed prior to the initiation of the procedure. Ultrasound survey of the left jugular region was performed with images stored and sent to PACs. The patient is prepped and draped in the usual sterile fashion. Skin and subcutaneous  tissues were generously infiltrated 1% lidocaine for local anesthesia. Using ultrasound guidance, micropuncture set was used access the left internal jugular vein. Micro wire was passed through the needle into the right heart. Small incision was made in the needle was removed. Peel-away sheath was then placed, and a length of 19 cm was estimated. Triple-lumen 5 French catheter was then ligated at 19 cm and passed through the peel-away sheath. Peel-away sheath was removed and the catheter was sutured in position and flushed. Final image was stored. Patient tolerated the procedure well and remained  hemodynamically stable throughout. No complications were encountered and no significant blood loss. IMPRESSION: Status post left IJ central catheter placement. Catheter ready for use. Signed, Dulcy Fanny. Earleen Newport, DO [facility] Electronically Signed   By: Corrie Mckusick D.O.   On: 02/23/2016 17:03   Ir US Guide Vasc Access Left  Result Date: 02/23/2016 INDICATION: 46 year old female with multi lobar pneumonia. EXAM: IR LEFT FLOURO GUIDE CV LINE; IR ULTRASOUND GUIDANCE VASC ACCESS LEFT MEDICATIONS: None ANESTHESIA/SEDATION: None FLUOROSCOPY TIME:  Fluoroscopy Time: 0 minutes 6 seconds (1 mGy). COMPLICATIONS: None PROCEDURE: Informed written consent was obtained from the patient's family after a thorough discussion of the procedural risks, benefits and alternatives. All questions were addressed. Maximal Sterile Barrier Technique was utilized including caps, mask, sterile gowns, sterile gloves, sterile drape, hand hygiene and skin antiseptic. A timeout was performed prior to the initiation of the procedure. Ultrasound survey of the left jugular region was performed with images stored and sent to PACs. The patient is prepped and draped in the usual sterile fashion. Skin and subcutaneous tissues were generously infiltrated 1% lidocaine for local anesthesia. Using ultrasound guidance, micropuncture set was used access the left internal jugular vein. Micro wire was passed through the needle into the right heart. Small incision was made in the needle was removed. Peel-away sheath was then placed, and a length of 19 cm was estimated. Triple-lumen 5 French catheter was then ligated at 19 cm and passed through the peel-away sheath. Peel-away sheath was removed and the catheter was sutured in position and flushed. Final image was stored. Patient tolerated the procedure well and remained hemodynamically stable throughout. No complications were encountered and no  significant blood loss. IMPRESSION: Status post left IJ central catheter placement. Catheter ready for use. Signed, Dulcy Fanny. Earleen Newport, DO [facility] Radiology Electronically Signed   By: Corrie Mckusick D.O.   On: 02/23/2016 17:03   Dg Chest Port 1 View  Result Date: 03/12/2016 CLINICAL DATA:  Acute respiratory failure with hypoxia. Tracheostomy patient. EXAM: PORTABLE CHEST 1 VIEW COMPARISON:  Portable chest x-ray of June 05, 2016 and March 04, 2016. FINDINGS: The lungs remain mildly hypoinflated. Patchy density at the left lung base persists but there has been marked improvement since a study of March 04, 2016. The left hemidiaphragm is mildly elevated. The heart and pulmonary vascularity are normal. The tracheostomy appliance tip projects at the superior margin of the clavicular heads. The left internal jugular venous catheter tip projects over the midportion of the SVC. The bony thorax exhibits no acute abnormality. IMPRESSION: Further interval improvement in the appearance of the left lower lobe pneumonia. Electronically Signed   By: David  Martinique M.D.   On: 03/12/2016 12:46   Dg Chest Port 1 View  Result Date: 03/07/2016 CLINICAL DATA:  Respiratory failure. EXAM: PORTABLE CHEST 1 VIEW COMPARISON:  03/04/2016. FINDINGS: Interim placement of tracheostomy tube. Left IJ line  in stable position. Heart size stable. Low lung volumes with mild bibasilar infiltrates. Small left pleural effusion cannot be excluded. Elevation left hemidiaphragm again noted. IMPRESSION: 1. Tracheostomy tube in good anatomic position. Left IJ line in good anatomic position. 2. Low lung volumes. Persistent left base infiltrate. No change from prior exam. Electronically Signed   By: Marcello Moores  Register   On: 03/07/2016 16:09   Dg Chest Port 1 View  Result Date: 03/07/2016 CLINICAL DATA:  Intubated patient, acute respiratory failure, pneumonia EXAM: PORTABLE CHEST 1 VIEW COMPARISON:   Portable chest x-ray of March 04, 2016 FINDINGS: The lungs are borderline hypoinflated. The retrocardiac region on the left remains dense with obscuration of the mildly elevated hemidiaphragm. The heart is not enlarged. The pulmonary vascularity is not engorged. The endotracheal tube tip lies 5.6 cm above the carina. The left internal jugular venous catheter tip projects over the midportion of the SVC. IMPRESSION: Persistent left lower lobe atelectasis or pneumonia somewhat improved since the previous study. Probable small left pleural effusion. The support devices are in stable position. Electronically Signed   By: David  Martinique M.D.   On: 03/07/2016 06:49   Dg Chest Port 1 View  Result Date: 03/04/2016 CLINICAL DATA:  Acute respiratory failure. EXAM: PORTABLE CHEST 1 VIEW COMPARISON:  03/01/2016 FINDINGS: Endotracheal tube is in place with tip 4.1 cm above the carina. Left IJ central line tip to the superior vena cava. There is elevation of left hemidiaphragm. There is increasing opacity within the left lower lobe. No pulmonary edema. Right lung remains clear. IMPRESSION: Increasing left lower lobe infiltrate. Stable elevation of left hemidiaphragm. Electronically Signed   By: Nolon Nations M.D.   On: 03/04/2016 07:25   Dg Chest Port 1 View  Result Date: 03/01/2016 CLINICAL DATA:  Hypoxia EXAM: PORTABLE CHEST 1 VIEW COMPARISON:  February 29, 2016 FINDINGS: Endotracheal tube tip is 3.2 cm above the carina. Central catheter tip is at the cavoatrial junction. No pneumothorax. There is patchy consolidation in the left base with small left pleural effusion. Right lung is clear. Heart is upper normal in size with pulmonary vascularity within normal limits. No adenopathy. No bone lesions. IMPRESSION: Tube and catheter positions as described without pneumothorax. Consolidation left base with small left pleural effusion. Right lung clear. Electronically Signed   By: Lowella Grip III M.D.   On: 03/01/2016  07:50   Dg Chest Port 1 View  Result Date: 02/29/2016 CLINICAL DATA:  Hypoxia EXAM: PORTABLE CHEST 1 VIEW COMPARISON:  February 28, 2016 FINDINGS: Endotracheal tube tip is 2.3 cm above the carina. Central catheter tip is at the cavoatrial junction. No pneumothorax. There is mild bibasilar atelectasis. Lungs elsewhere clear. Heart size and pulmonary vascularity are normal. No adenopathy. No bone lesions. IMPRESSION: Tube and catheter positions as described without pneumothorax. Bibasilar atelectasis. No frank edema or consolidation. Stable cardiac silhouette. Electronically Signed   By: Lowella Grip III M.D.   On: 02/29/2016 07:07   Dg Chest Port 1 View  Result Date: 02/28/2016 CLINICAL DATA:  Respiratory failure.  Shortness of breath. EXAM: PORTABLE CHEST 1 VIEW COMPARISON:  02/27/2016 . FINDINGS: Endotracheal tube in stable position. Low lung volumes with mild bibasilar atelectasis. Interim improvement from prior exam. Left base atelectasis/ infiltrate has cleared. Interim near complete resolution of small bilateral pleural effusions. No pneumothorax . IMPRESSION: 1. Lines and tubes in stable position. 2. Low lung volumes with mild bibasilar subsegmental atelectasis. Interim significant improvement from prior exam. Interim near complete resolution of small  bilateral pleural effusions . Electronically Signed   By: Marcello Moores  Register   On: 02/28/2016 06:54   Dg Chest Port 1 View  Result Date: 02/27/2016 CLINICAL DATA:  Respiratory failure. EXAM: PORTABLE CHEST 1 VIEW COMPARISON:  02/26/2016. FINDINGS: Endotracheal tube, left IJ line in stable position. Heart size normal. Low lung volumes with basilar atelectasis. Developing left base infiltrate. Tiny pleural effusions cannot be excluded. No pneumothorax . IMPRESSION: 1. Lines and tubes in stable position. 2. Low lung volumes with basilar atelectasis. Developing left lower lobe infiltrate. Small bilateral pleural effusions cannot be excluded.  Electronically Signed   By: Marcello Moores  Register   On: 02/27/2016 07:13   Dg Chest Port 1 View  Result Date: 02/26/2016 CLINICAL DATA:  Acute respiratory failure. EXAM: PORTABLE CHEST 1 VIEW COMPARISON:  02/25/2016 FINDINGS: Endotracheal tube has tip 3.6 cm above the carina. Left IJ central venous catheters unchanged with tip over the right atrium. Lungs are hypoinflated with linear atelectasis over the right midlung. Mild patchy opacification over the medial right upper lobe. Mild stable cardiomegaly. Remainder of the exam is unchanged. IMPRESSION: Mild opacification over the medial right upper lobe as cannot exclude residual infection. Minimal linear atelectasis right midlung. Tubes and lines as described. Electronically Signed   By: Marin Olp M.D.   On: 02/26/2016 07:28   Dg Chest Port 1 View  Result Date: 02/25/2016 CLINICAL DATA:  Acute respiratory failure.  Endotracheal tube. EXAM: PORTABLE CHEST 1 VIEW COMPARISON:  02/24/2016 FINDINGS: Endotracheal tube and left IJ central venous catheter are unchanged. Lungs are hypoinflated with minimal right base opacification unchanged likely atelectasis. Subtle prominence of the perihilar markings. Cardiomediastinal silhouette and remainder of the exam is unchanged. IMPRESSION: Stable mild right base opacification likely atelectasis. Suggestion of minimal vascular congestion. Tubes and lines unchanged. Electronically Signed   By: Marin Olp M.D.   On: 02/25/2016 08:01   Dg Chest Port 1 View  Result Date: 02/24/2016 CLINICAL DATA:  Acute respiratory failure. EXAM: PORTABLE CHEST 1 VIEW COMPARISON:  02/23/2016 and chest CT 02/22/2016 FINDINGS: Endotracheal tube unchanged. Interval placement of left IJ central venous catheter which has tip at the cavoatrial junction. Patient slightly rotated to the right. Lungs are hypoinflated with mild stable bibasilar opacification which may be due to atelectasis or infection. Increased density in the right hilar region  slightly worse. No pneumothorax. Cardiomediastinal silhouette and remainder of the exam is unchanged. IMPRESSION: Persistent bibasilar opacification with slight worsening right hilar opacification suggesting atelectasis or infection. Tubes and lines as described. Electronically Signed   By: Marin Olp M.D.   On: 02/24/2016 08:44   Dg Chest Port 1 View  Result Date: 02/23/2016 CLINICAL DATA:  Initial evaluation for acute respiratory failure. EXAM: PORTABLE CHEST 1 VIEW COMPARISON:  Prior radiograph from 02/22/2016. FINDINGS: Patient remains intubated with the tip of an endotracheal tube positioned 2.9 cm above the carina. Cardiac and mediastinal silhouettes are stable, and remain within normal limits. Lungs are hypoinflated. Patchy bibasilar opacities, right greater than left, again suspicious for possible multi lobar pneumonia. Overall, these are slightly more dense as compared to the prior exam. Underlying pulmonary interstitial edema is improved. Asymmetric prominence of the pulmonary vasculature within the right lung favored to be related to patient rotation on this exam. No pneumothorax. No acute osseous abnormality. IMPRESSION: 1. Tip of the endotracheal tube 2.9 cm above the carina. 2. Shallow lung inflation with persistent right greater than left bibasilar opacities, compatible with multi lobar pneumonia. Overall, these are slightly more  dense in appearance as compared to previous exam. 3. Improved/resolved underlying pulmonary interstitial edema. Electronically Signed   By: Jeannine Boga M.D.   On: 02/23/2016 06:30   Dg Chest Port 1 View  Result Date: 02/22/2016 CLINICAL DATA:  Respiratory failure. EXAM: PORTABLE CHEST 1 VIEW COMPARISON:  CT chest 02/22/2016 FINDINGS: Endotracheal tube with the tip 4.5 cm above the carina. Patchy areas of airspace disease in the right upper and lower lobes. Patchy airspace disease in the left lower lobe. No pleural effusion or pneumothorax. Stable  cardiomediastinal silhouette. IMPRESSION: 1. Endotracheal tube with the tip 4.5 cm above the carina. 2. Airspace disease in the right upper, right lower and left lower lobes most consistent with multi lobar pneumonia. Electronically Signed   By: Kathreen Devoid   On: 02/22/2016 16:03   Dg Chest Portable 1 View  Result Date: 02/22/2016 CLINICAL DATA:  46 year old female status post ET tube placement. Patient's septic. EXAM: PORTABLE CHEST 1 VIEW COMPARISON:  Chest radiograph dated 07/28/2015 FINDINGS: An endotracheal tube is seen with tip approximately 3 cm above the carina. An enteric tube extends into the left upper abdomen with tip and side-port likely in the proximal stomach. There is near complete consolidative changes of the right upper lobe with collapse. There is near complete consolidative changes of the right middle lobe. The lucent area within the right hemithorax likely represents and aerated portion of the lung. A pneumothorax is less likely as there appears to be lung markings, but not entirely excluded. Further evaluation with CT is recommended. The left lung is clear.  The osseous structures are intact. IMPRESSION: Endotracheal tube above the carina. Large consolidative areas primarily in the right upper lobe and right middle lobe. A centrally obstructing mass or endobronchial lesion is not excluded. CT is recommended for further evaluation. Probable aerated portion of the right lower lobe versus less likely a pneumothorax. These results were called by telephone at the time of interpretation on 02/22/2016 at 6:45 am to Dr. Pryor Curia , who verbally acknowledged these results. Electronically Signed   By: Anner Crete M.D.   On: 02/22/2016 06:47     LOS: 37 days   Oren Binet, MD  [facility] Pager:[phone]  If 7PM-7AM, please contact night-coverage [email or web address] Password [email or web address] 03/13/2016, 10:30 AM

## 2016-03-13 NOTE — Consult Note (Signed)
WOC Nurse wound consult note Reason for Consult: buttock pressure injury Also noted in chart to have right ankle pressure injury Wound type: no open wounds found today at the time of my assessment  Bedside nurse as well looked at right ankle and left ankle-no open wounds (skin changes, evidence of healing with skin color different) but not open No Stage 1 Pressure injury or other skin alterations noted on the buttocks or sacrum  Pressure Injury POA: No   Continue Sport mattress for pressure redistribution, patient high risk with Braden score 12.  Turn and reposition every 2 hours.  Patient has flexiseal and FC for bowel and bladder incontinence.    Discussed POC with patient and bedside nurse.  Re consult if needed, will not follow at this time. Thanks  Karletta Millay M.[employer] MSN, RN,CWOCN, CNS [phone])

## 2016-03-14 DIAGNOSIS — Z978 Presence of other specified devices: Secondary | ICD-10-CM

## 2016-03-14 DIAGNOSIS — J9611 Chronic respiratory failure with hypoxia: Secondary | ICD-10-CM

## 2016-03-14 LAB — BASIC METABOLIC PANEL
ANION GAP: 8 (ref 5–15)
BUN: 26 mg/dL — ABNORMAL HIGH (ref 6–20)
CO2: 31 mmol/L (ref 22–32)
Calcium: 8.9 mg/dL (ref 8.9–10.3)
Chloride: 103 mmol/L (ref 101–111)
Creatinine, Ser: 0.67 mg/dL (ref 0.44–1.00)
GFR calc non Af Amer: >60 mL/min (ref >60)
Glucose, Bld: 114 mg/dL — ABNORMAL HIGH (ref 65–99)
POTASSIUM: 3.8 mmol/L (ref 3.5–5.1)
Sodium: 142 mmol/L (ref 135–145)

## 2016-03-14 LAB — GLUCOSE, CAPILLARY
GLUCOSE-CAPILLARY: 111 mg/dL — AB (ref 65–99)
GLUCOSE-CAPILLARY: 106 mg/dL — AB (ref 65–99)
Glucose-Capillary: 103 mg/dL — ABNORMAL HIGH (ref 65–99)
Glucose-Capillary: 112 mg/dL — ABNORMAL HIGH (ref 65–99)
Glucose-Capillary: 104 mg/dL — ABNORMAL HIGH (ref 65–99)
Glucose-Capillary: 105 mg/dL — ABNORMAL HIGH (ref 65–99)

## 2016-03-14 LAB — CBC WITH DIFFERENTIAL/PLATELET
BASOS ABS: 0 10*3/uL (ref 0.0–0.1)
BASOS PCT: 0 %
Eosinophils Absolute: 0.8 10*3/uL — ABNORMAL HIGH (ref 0.0–0.7)
Eosinophils Relative: 6 %
HEMATOCRIT: 28.9 % — AB (ref 36.0–46.0)
HEMOGLOBIN: 8.8 g/dL — AB (ref 12.0–15.0)
Lymphocytes Relative: 25 %
Lymphs Abs: 3.6 10*3/uL (ref 0.7–4.0)
MCH: 25.9 pg — ABNORMAL LOW (ref 26.0–34.0)
MCHC: 30.4 g/dL (ref 30.0–36.0)
MCV: 85 fL (ref 78.0–100.0)
MONOS PCT: 6 %
Monocytes Absolute: 0.8 10*3/uL (ref 0.1–1.0)
NEUTROS ABS: 9.3 10*3/uL — AB (ref 1.7–7.7)
NEUTROS PCT: 63 %
Platelets: 381 10*3/uL (ref 150–400)
RBC: 3.4 MIL/uL — AB (ref 3.87–5.11)
RDW: 17.3 % — ABNORMAL HIGH (ref 11.5–15.5)
WBC: 14.6 10*3/uL — AB (ref 4.0–10.5)

## 2016-03-14 LAB — VANCOMYCIN, TROUGH: Vancomycin Tr: 14 ug/mL — ABNORMAL LOW (ref 15–20)

## 2016-03-14 MED ORDER — VANCOMYCIN HCL IN DEXTROSE 750-5 MG/150ML-% IV SOLN
750.0000 mg | Freq: Two times a day (BID) | INTRAVENOUS | Status: DC
  Administered 2016-03-14 – 2016-03-17 (×6): 750 mg via INTRAVENOUS
  Filled 2016-03-14 (×7): qty 150

## 2016-03-14 NOTE — Progress Notes (Signed)
PULMONARY / CRITICAL CARE MEDICINE   Name: [name] SchmidtChevella Harston MRN: [id number] DOB: 08/15/1970    ADMISSION DATE:  02/22/2016 CONSULTATION DATE:  02/22/16  REFERRING MD:  Baxter HireKristen Ward MD  CHIEF COMPLAINT:  Acute respiratory failure, Multilobar PNA  HISTORY OF PRESENT ILLNESS:   [name] is a 46 year old with PMHx of frontotemporal dementia, restless leg syndrome who was admitted on 12/21 with septic shock, acute respiratory failure and R multilobar PNA who underwent tracheostomy on 1/4 and has remained vent dependent since then.   SUBJECTIVE:  No acute events.  Attempted wean yesterday 01/10 but did not tolerate (had tachypnea with RR in 40's).   VITAL SIGNS: BP 102/76   Pulse 94   Temp 98 F (36.7 C) (Oral)   Resp 13   Ht 5\' 5"  (1.651 m)   Wt 137 lb 2 oz (62.2 kg)   SpO2 100%   BMI 22.82 kg/m   HEMODYNAMICS:    VENTILATOR SETTINGS: Vent Mode: PRVC FiO2 (%):  [40 %] 40 % Set Rate:  [12 bmp] 12 bmp Vt Set:  [400 mL] 400 mL PEEP:  [5 cmH20] 5 cmH20 Plateau Pressure:  [14 cmH20-20 cmH20] 18 cmH20  INTAKE / OUTPUT: I/O last 3 completed shifts: In: 2980 [I.V.:300; NG/GT:1980; IV Piggyback:700] Out: 2205 [Urine:2205]  PHYSICAL EXAMINATION:  General:  Chronically ill appearing, NAD Neuro: Eyes open, does not follow commands HEENT: PERRL, Trach clean Cardiovascular:  RRR, no mgr Lungs: Crackles bases bilaterally, vent supported breaths Abdomen: Non tender, non distended, + BS Musculoskeletal:  Contracted upper extremities bilaterally  LABS:  BMET  Recent Labs Lab 03/12/16 0429 03/13/16 0438 03/14/16 0400  NA 142 143 142  K 3.4* 3.6 3.8  CL 103 105 103  CO2 30 31 31   BUN 31* 27* 26*  CREATININE 0.79 0.71 0.67  GLUCOSE 115* 113* 114*    Electrolytes  Recent Labs Lab 03/12/16 0429 03/13/16 0438 03/14/16 0400  CALCIUM 9.0 9.1 8.9    CBC  Recent Labs Lab 03/12/16 0429 03/13/16 0438 03/14/16 0400  WBC 14.7* 14.7* 14.6*  HGB 9.3* 9.2* 8.8*   HCT 30.2* 29.7* 28.9*  PLT 429* 399 381    Coag's No results for input(s): APTT, INR in the last 168 hours.  Sepsis Markers No results for input(s): LATICACIDVEN, PROCALCITON, O2SATVEN in the last 168 hours.  ABG  Recent Labs Lab 03/08/16 1000  PHART 7.511*  PCO2ART 29.3*  PO2ART 119*    Liver Enzymes No results for input(s): AST, ALT, ALKPHOS, BILITOT, ALBUMIN in the last 168 hours.  Cardiac Enzymes No results for input(s): TROPONINI, PROBNP in the last 168 hours.  Glucose  Recent Labs Lab 03/13/16 1124 03/13/16 1537 03/13/16 1948 03/13/16 2332 03/14/16 0345 03/14/16 0802  GLUCAP 129* 102* 117* 116* 103* 111*    Imaging No results found.   STUDIES:  CT chest 12/21 > Complete right upper lobe consolidation and significant volume loss with occlusion of the right upper lobe bronchus by low-attenuation material, favoring mucous plugging. No discrete obstructing mass. 3. Moderate patchy consolidation and ground-glass attenuation throughout the right middle lobe, lingula and bilateral lower lobes, favoring multilobar pneumonia and/or aspiration 12/21 bronchoscopy >> mucus plugs Rt CXR 1/4 >> Persistent left lower lobe atelectasis or pneumonia somewhat improved since the previous study. Probable small left pleural effusion. The support devices are in stable position. CXR 1/4 >> Tracheostomy tube in good anatomic position. Left IJ line in good anatomic position. Low lung volumes. Persistent left base infiltrate. No change from  prior exam.  CULTURES: Bcx 12/21 >> coag neg staph 1/2 Sputum Cx 12/21 > pseudomonas pan-S, MRSA Stool norovirus + Stool c diff Ag positive, toxin negative  RVP >> negative  Urine strep ag neg Urine legionella neg BCx 1/2 >> NGTD x 2 days UCx >> Gram Negative Rods >proteus, e coli, ESBL resp culture 1/4: Pseudamonas, MRSA  ANTIBIOTICS: Zosyn 12/21 >> 12/25 Azithro 12/21 >> 12/23 Cipro 12/25 >> 12/30 Vanco 12/21 >> 01/27 Ceftriaxone  1/3 >> 01/05 Meropenem 01/06 > (stop date 01/20)   SIGNIFICANT EVENTS: 12/21 > Admitted with respiratory failure  LINES/TUBES: ETT 12/21>>1/4 Trach 1/4 (df)>> Left IJ 12/22 >> Foley >> PEG >>  DISCUSSION: [name] is a 46 year old NHR with PMHx of frontotemporal dementia, restless leg syndrome who was admitted with septic shock, acute respiratory failure and R multilobar PNA who remains intubated as she continues to fail SBTs. Janina Mayo was placed on 1/4.  Continues to have respiratory failure in setting of ongoing HCAP from MRSA and pseudomonas.  ASSESSMENT / PLAN:  PULMONARY A: Acute Hypercapnic, Hypoxic Respiratory Failure s/p Trach Placement 1/4 - persistent respiratory failure and has not been able to wean from vent (had tachypnea 01/10) MRSA and pseudomonas pneumonia Tracheostomy status P:   Continue daily Pressure support trials, full vent support at night Trach care per routine Will need long term vent placement per family wishes Continue antibiotics as you are doing   Rutherford Guys, PA - [facility] Pager: [phone]  or [phone] 03/14/2016, 10:48 AM

## 2016-03-14 NOTE — Progress Notes (Signed)
Nutrition Follow-up  DOCUMENTATION CODES:   Not applicable  INTERVENTION:    Continue Vital AF 1.2 at 55 ml/h (1320 ml per day)  NUTRITION DIAGNOSIS:   Inadequate oral intake related to inability to eat as evidenced by NPO status.  Ongoing  GOAL:   Patient will meet greater than or equal to 90% of their needs  Met  MONITOR:   Vent status, Labs, TF tolerance, I & O's  ASSESSMENT:   46 year old with past medical history of frontotemporal dementia, restless leg syndrome. She is a nursing home resident. She was brought to the ED with altered mental status and respiratory distress. Required intubation in the ED.  Discussed patient in ICU rounds and with RN today. S/P trach 1/4, currently on ventilator support. PEG in place, receiving Vital AF 1.2 at 55 ml/h (1320 ml per day) to provide 1584kcals, 99gm protein, 1030m free water daily Free water 200 ml QID Labs and medications reviewed.  Diet Order:  Diet NPO time specified  Skin:  Wound (see comment) (stage I to buttocks)  Last BM:  1/11  Height:   Ht Readings from Last 1 Encounters:  02/22/16 _0  (1.651 m)    Weight:   Wt Readings from Last 1 Encounters:  03/14/16 137 lb 2 oz (62.2 kg)    Ideal Body Weight:  56.8 kg  BMI:  Body mass index is 22.82 kg/m.  Estimated Nutritional Needs:   Kcal:  16808 Protein:  90-105 gm  Fluid:  1.8 L  EDUCATION NEEDS:   No education needs identified at this time  KMolli Barrows RPontotoc LStrang CLake TapawingoPager [phone] Hours Pager [phone]

## 2016-03-14 NOTE — Progress Notes (Signed)
Pharmacy Antibiotic Note  [name] [name] is a 46 y.o. female admitted on 02/22/2016 with pneumonia and UTI.  Pharmacy has been consulted for vancomycin and meropenem dosing. Patient noted with MRSA and pseudomonal PNA and ESBL E coli and Proteus mirabilis UTI.  -vancomycin trough= 14 at 0400 (last dose given an 0403) -SCr= 0.67, CrCl ~ 80, WBC= 14,6, afebrile  Plan:  Change vancomycin to 742m IV q12h Meropenem 1gm IV Q8H F/u renal fxn, C&S, clinical status and trough at SS  Height: _0  (165.1 cm) Weight: 137 lb 2 oz (62.2 kg) IBW/kg (Calculated) : 57  Temp (24hrs), Avg:98.6 F (37 C), Min:97.8 F (36.6 C), Max:99.6 F (37.6 C)   Recent Labs Lab 03/10/16 0417 03/11/16 0655 03/12/16 0429 03/13/16 0438 03/14/16 0400  WBC 18.3* 16.3* 14.7* 14.7* 14.6*  CREATININE 0.82 0.79 0.79 0.71 0.67  VANCOTROUGH  --   --   --   --  14*    Estimated Creatinine Clearance: 79.1 mL/min (by C-G formula based on SCr of 0.67 mg/dL).    Allergies  Allergen Reactions  . Sulfa Antibiotics Other (See Comments)    Per MAR    Antimicrobials this admission: Vancomycin 12/21>>12/24; 1/6>> Zosyn 12/21>>12/24 Azithromycin 12/21 >> 12/23 PO Vanc 12/21 >>  Cipro 12/25 >>12/30 Ceftriaxone 1/3 >> 1/5 Ceftazidime 1/5 >>1/6 Meropenem 1/6>>  Dose adjustments this admission: N/A  Microbiology results: 12/21BCx: 1/2 GPC clusters (BCID- CONS) 12/21UCx: multiple species, none predominant 12/21 BAL: >100K Pseudomonas; 20K MRSA 12/21 C. Diff Ag positive, toxin negative, PCR positive 12/21 Legionella UAg: ip 12/21 Strep UAg: negative 12/21 RVP: negative 12/21 GI Panel: + Norovirus 12/21 MRSA PCR negative 12/21 Influenza PCR negative 1/3 urine cx: Proteus, E.coli 1/2 blood cx: ngtd 1/4 BAL: 70K staph aureus, 50k pseudomonas  Thank you for asking pharmacy to be involved in the care of this patient.  AHildred Laser Pharm D 03/14/2016 5:21 AM

## 2016-03-14 NOTE — Progress Notes (Signed)
PROGRESS NOTE        PATIENT DETAILS Name: [name] [name] Age: 46 y.o. Sex: female Date of Birth: 26-Feb-1971 Admit Date: 02/22/2016 Admitting Physician Marshell Garfinkel, MD YYQ:MGNO, SAAD, MD  Brief Narrative: Patient is a 46 y.o. female with past medical history of frontotemporal dementia of an resident of a skilled nursing facility-apparently contracted and aphasic at baseline-brought to the hospital on 02/22/16 with acute respiratory failure from multilobar pneumonia. She was intubated on admission, and was difficult to wean and subsequently required a tracheostomy on 1/4. She is still ventilator dependent. Hospital course was complicated by development of multiple electrolyte abnormalities, and possible C. difficile colitis. See below for further details  Subjective: Unchanged-not responding-aphasic. Not following commands. .  Assessment/Plan: Acute hypoxemic respiratory failure: Secondary to pneumonia.Intubated and unable to wean, tracheostomy placed 1/. [facility] following for vent/trach care. Spoke with social work-needs vent-SNF at discharge, social work looking for numerous facilities, but as of now has a bed at [facility] on 03/21/2016  Septic shock secondary to multilobar pneumonia, C Diff colitis, ESBL UTI: Sepsis pathophysiology has resolved. Leukocytosis is now downtrending, she is afebrile. Her blood cultures on 1/2 are negative, BAL culture on 1/4 positive for Pseudomonas and MRSA, urine culture positive for ESBL Escherichia coli and Proteus. Remains on empiric vancomycin and meropenem with stop date of 1/20. Furthermore, hospital course was complicated by diarrhea-thought to be due to a combination of normal virus and C. difficile colitis and remains on vancomycin (stop date 1/27-with treatment extended for 1 week following completion of tx for MRSA/Pseudomonas) through the PEG tube.  Anemia: Likely secondary to acute illness, follow.  Chronic  dysphagia: Has a PEG tube in place.  History of frontotemporal dementia: Reviewed H&P-contracted, not verbal and bedbound at discharge. Resident of SNF.  Palliative care/goals of care: Very poor prognoses-seen by palliative care during this hospital stay, family still desires full aggressive care in spite of poor overall prognoses.  DVT Prophylaxis: Prophylactic Lovenox   Code Status: Full code   Family Communication: None at bedside-we will reach out to  her to family over the next few days  Disposition Plan: Remain inpatient-SNF on discharge-when bed available. Per SW-not a LTACH candidate  Antimicrobial agents: Anti-infectives    Start     Dose/Rate Route Frequency Ordered Stop   03/14/16 1600  vancomycin (VANCOCIN) IVPB 750 mg/150 ml premix     750 mg 150 mL/hr over 60 Minutes Intravenous Every 12 hours 03/14/16 0526 03/23/16 2359   03/10/16 1400  vancomycin (VANCOCIN) 50 mg/mL oral solution 125 mg     125 mg Oral 4 times daily 03/10/16 1230 03/30/16 2359   03/10/16 0400  vancomycin (VANCOCIN) 500 mg in sodium chloride 0.9 % 100 mL IVPB  Status:  Discontinued     500 mg 100 mL/hr over 60 Minutes Intravenous Every 12 hours 03/09/16 1524 03/14/16 0526   03/09/16 1600  meropenem (MERREM) 1 g in sodium chloride 0.9 % 100 mL IVPB     1 g 200 mL/hr over 30 Minutes Intravenous Every 8 hours 03/09/16 1524 03/23/16 2359   03/09/16 1600  vancomycin (VANCOCIN) IVPB 1000 mg/200 mL premix     1,000 mg 200 mL/hr over 60 Minutes Intravenous  Once 03/09/16 1524 03/09/16 1748   03/08/16 1100  cefTAZidime (FORTAZ) 1 g in dextrose 5 % 50 mL IVPB  Status:  Discontinued     1 g 100 mL/hr over 30 Minutes Intravenous Every 12 hours 03/08/16 0945 03/09/16 1511   03/07/16 1400  vancomycin (VANCOCIN) 50 mg/mL oral solution 125 mg  Status:  Discontinued     125 mg Oral 4 times daily 03/07/16 1043 03/09/16 1518   03/06/16 1300  cefTRIAXone (ROCEPHIN) 1 g in dextrose 5 % 50 mL IVPB  Status:   Discontinued     1 g 100 mL/hr over 30 Minutes Intravenous Every 24 hours 03/06/16 1158 03/08/16 0945   02/26/16 1700  vancomycin (VANCOCIN) IVPB 1000 mg/200 mL premix  Status:  Discontinued     1,000 mg 200 mL/hr over 60 Minutes Intravenous Every 8 hours 02/26/16 1026 02/26/16 1028   02/26/16 1030  ciprofloxacin (CIPRO) IVPB 400 mg  Status:  Discontinued     400 mg 200 mL/hr over 60 Minutes Intravenous Every 12 hours 02/26/16 1026 03/02/16 0949   02/25/16 1930  vancomycin (VANCOCIN) IVPB 1000 mg/200 mL premix  Status:  Discontinued     1,000 mg 200 mL/hr over 60 Minutes Intravenous Every 8 hours 02/25/16 1858 02/26/16 1017   02/24/16 1915  vancomycin (VANCOCIN) IVPB 1000 mg/200 mL premix  Status:  Discontinued     1,000 mg 200 mL/hr over 60 Minutes Intravenous Every 8 hours 02/24/16 1106 02/25/16 0927   02/24/16 1115  vancomycin (VANCOCIN) 1,250 mg in sodium chloride 0.9 % 250 mL IVPB     1,250 mg 166.7 mL/hr over 90 Minutes Intravenous  Once 02/24/16 1106 02/24/16 1430   02/22/16 1800  vancomycin (VANCOCIN) IVPB 750 mg/150 ml premix  Status:  Discontinued     750 mg 150 mL/hr over 60 Minutes Intravenous Every 8 hours 02/22/16 0949 02/24/16 1106   02/22/16 1430  vancomycin (VANCOCIN) 50 mg/mL oral solution 125 mg     125 mg Oral 4 times daily 02/22/16 1420 03/07/16 1359   02/22/16 1400  piperacillin-tazobactam (ZOSYN) IVPB 3.375 g  Status:  Discontinued     3.375 g 12.5 mL/hr over 240 Minutes Intravenous Every 8 hours 02/22/16 0949 02/26/16 0955   02/22/16 1030  vancomycin (VANCOCIN) 500 mg in sodium chloride 0.9 % 100 mL IVPB     500 mg 100 mL/hr over 60 Minutes Intravenous  Once 02/22/16 0949 02/22/16 1212   02/22/16 0900  azithromycin (ZITHROMAX) 500 mg in dextrose 5 % 250 mL IVPB  Status:  Discontinued     500 mg 250 mL/hr over 60 Minutes Intravenous Every 24 hours 02/22/16 0846 02/24/16 0859   02/22/16 0615  piperacillin-tazobactam (ZOSYN) IVPB 3.375 g     3.375 g 100 mL/hr  over 30 Minutes Intravenous  Once 02/22/16 0601 02/22/16 0708   02/22/16 0615  vancomycin (VANCOCIN) IVPB 1000 mg/200 mL premix     1,000 mg 200 mL/hr over 60 Minutes Intravenous  Once 02/22/16 0601 02/22/16 0748      Procedures: ETT 12/21>>1/4 Trach 1/4 >> Left IJ 12/22 >> Foley >> PEG >> Rectal tube >>  CONSULTS: PCCM  Palliative Care  Time spent: 25- minutes-Greater than 50% of this time was spent in counseling, explanation of diagnosis, planning of further management, and coordination of care.  MEDICATIONS: Scheduled Meds: . chlorhexidine gluconate (MEDLINE KIT)  15 mL Mouth Rinse BID  . enoxaparin (LOVENOX) injection  40 mg Subcutaneous Q24H  . famotidine  20 mg Per Tube BID  . free water  200 mL Per Tube Q6H  . furosemide  20 mg Intravenous Daily  .  insulin aspart  2-6 Units Subcutaneous Q4H  . mouth rinse  15 mL Mouth Rinse QID  . meropenem (MERREM) IV  1 g Intravenous Q8H  . vancomycin  125 mg Oral QID  . vancomycin  750 mg Intravenous Q12H   Continuous Infusions: . feeding supplement (VITAL AF 1.2 CAL) 1,000 mL (03/14/16 0912)   PRN Meds:.sodium chloride, acetaminophen, albuterol, ondansetron (ZOFRAN) IV   PHYSICAL EXAM: Vital signs: Vitals:   03/14/16 0839 03/14/16 0900 03/14/16 1000 03/14/16 1100  BP: 96/65 103/77 102/76   Pulse: 93 91 94 99  Resp: _0 Temp:      TempSrc:      SpO2: 100% 100% 100% 100%  Weight:      Height:       Filed Weights   03/12/16 0421 03/13/16 0431 03/14/16 0406  Weight: 64.8 kg (142 lb 13.7 oz) 64.8 kg (142 lb 13.7 oz) 62.2 kg (137 lb 2 oz)   Body mass index is 22.82 kg/m.   General exam: Lethargic, does not respond to voice. Not following any commands Respiratory system: Vent-trach. Course breath sounds  Cardiovascular system: S1 & S2 heard, tachycardic, regular. No JVD, murmurs, rubs, gallops or clicks.  Gastrointestinal system: Abdomen is nondistended, soft and nontender.Peg tube in pace Central nervous  system: does not respond to voice  Extremities: Symmetric, contracted at knees, b/l wrist drop Skin: No rashes, lesions or ulcers on exposed skin    I have personally reviewed following labs and imaging studies  LABORATORY DATA: CBC:  Recent Labs Lab 03/10/16 0417 03/11/16 0655 03/12/16 0429 03/13/16 0438 03/14/16 0400  WBC 18.3* 16.3* 14.7* 14.7* 14.6*  NEUTROABS 12.4* 10.7* 9.8* 9.2* 9.3*  HGB 10.4* 9.6* 9.3* 9.2* 8.8*  HCT 33.4* 30.9* 30.2* 29.7* 28.9*  MCV 83.3 84.7 83.9 85.1 85.0  PLT 437* PLATELET CLUMPS NOTED ON SMEAR, COUNT APPEARS ADEQUATE 429* 399 401    Basic Metabolic Panel:  Recent Labs Lab 03/10/16 0417 03/11/16 0655 03/12/16 0429 03/13/16 0438 03/14/16 0400  NA 142 143 142 143 142  K 3.3* 3.9 3.4* 3.6 3.8  CL 106 104 103 105 103  CO2 _1 GLUCOSE 124* 107* 115* 113* 114*  BUN 35* 34* 31* 27* 26*  CREATININE 0.82 0.79 0.79 0.71 0.67  CALCIUM 9.0 8.9 9.0 9.1 8.9    GFR: Estimated Creatinine Clearance: 79.1 mL/min (by C-G formula based on SCr of 0.67 mg/dL).  Liver Function Tests: No results for input(s): AST, ALT, ALKPHOS, BILITOT, PROT, ALBUMIN in the last 168 hours. No results for input(s): LIPASE, AMYLASE in the last 168 hours. No results for input(s): AMMONIA in the last 168 hours.  Coagulation Profile: No results for input(s): INR, PROTIME in the last 168 hours.  Cardiac Enzymes: No results for input(s): CKTOTAL, CKMB, CKMBINDEX, TROPONINI in the last 168 hours.  BNP (last 3 results) No results for input(s): PROBNP in the last 8760 hours.  HbA1C: No results for input(s): HGBA1C in the last 72 hours.  CBG:  Recent Labs Lab 03/13/16 1537 03/13/16 1948 03/13/16 2332 03/14/16 0345 03/14/16 0802  GLUCAP 102* 117* 116* 103* 111*    Lipid Profile: No results for input(s): CHOL, HDL, LDLCALC, TRIG, CHOLHDL, LDLDIRECT in the last 72 hours.  Thyroid Function Tests: No results for input(s): TSH, T4TOTAL, FREET4,  T3FREE, THYROIDAB in the last 72 hours.  Anemia Panel: No results for input(s): VITAMINB12, FOLATE, FERRITIN, TIBC, IRON, RETICCTPCT in the last 72 hours.  Urine  analysis:    Component Value Date/Time   COLORURINE YELLOW 03/05/2016 1725   APPEARANCEUR HAZY (A) 03/05/2016 1725   LABSPEC 1.019 03/05/2016 1725   PHURINE 5.0 03/05/2016 1725   GLUCOSEU NEGATIVE 03/05/2016 1725   HGBUR SMALL (A) 03/05/2016 1725   BILIRUBINUR NEGATIVE 03/05/2016 1725   KETONESUR NEGATIVE 03/05/2016 1725   PROTEINUR 100 (A) 03/05/2016 1725   UROBILINOGEN 1.0 10/17/2013 1957   NITRITE NEGATIVE 03/05/2016 1725   LEUKOCYTESUR LARGE (A) 03/05/2016 1725    Sepsis Labs: Lactic Acid, Venous    Component Value Date/Time   LATICACIDVEN 2.9 (HH) 02/22/2016 1539    MICROBIOLOGY: Recent Results (from the past 240 hour(s))  Culture, blood (Routine X 2) w Reflex to ID Panel     Status: None   Collection Time: 03/05/16  7:07 PM  Result Value Ref Range Status   Specimen Description BLOOD LEFT HAND  Final   Special Requests BOTTLES DRAWN AEROBIC AND ANAEROBIC 5CC EACH  Final   Culture NO GROWTH 5 DAYS  Final   Report Status 03/10/2016 FINAL  Final  Culture, blood (Routine X 2) w Reflex to ID Panel     Status: None   Collection Time: 03/05/16  7:08 PM  Result Value Ref Range Status   Specimen Description BLOOD RIGHT HAND  Final   Special Requests BOTTLES DRAWN AEROBIC AND ANAEROBIC 5CC EACH  Final   Culture NO GROWTH 5 DAYS  Final   Report Status 03/10/2016 FINAL  Final  Culture, Urine     Status: Abnormal   Collection Time: 03/06/16 11:30 AM  Result Value Ref Range Status   Specimen Description URINE, RANDOM  Final   Special Requests NONE  Final   Culture (A)  Final    >=100,000 COLONIES/mL PROTEUS MIRABILIS >=100,000 COLONIES/mL ESCHERICHIA COLI Confirmed Extended Spectrum Beta-Lactamase Producer (ESBL)    Report Status 03/09/2016 FINAL  Final   Organism ID, Bacteria PROTEUS MIRABILIS (A)  Final    Organism ID, Bacteria ESCHERICHIA COLI (A)  Final      Susceptibility   Escherichia coli - MIC*    AMPICILLIN >=32 RESISTANT Resistant     CEFAZOLIN >=64 RESISTANT Resistant     CEFTRIAXONE >=64 RESISTANT Resistant     CIPROFLOXACIN >=4 RESISTANT Resistant     GENTAMICIN <=1 SENSITIVE Sensitive     IMIPENEM <=0.25 SENSITIVE Sensitive     NITROFURANTOIN <=16 SENSITIVE Sensitive     TRIMETH/SULFA <=20 SENSITIVE Sensitive     AMPICILLIN/SULBACTAM 16 INTERMEDIATE Intermediate     PIP/TAZO <=4 SENSITIVE Sensitive     Extended ESBL POSITIVE Resistant     * >=100,000 COLONIES/mL ESCHERICHIA COLI   Proteus mirabilis - MIC*    AMPICILLIN >=32 RESISTANT Resistant     CEFAZOLIN <=4 SENSITIVE Sensitive     CEFTRIAXONE <=1 SENSITIVE Sensitive     CIPROFLOXACIN >=4 RESISTANT Resistant     GENTAMICIN <=1 SENSITIVE Sensitive     IMIPENEM 4 SENSITIVE Sensitive     NITROFURANTOIN 128 RESISTANT Resistant     TRIMETH/SULFA 160 RESISTANT Resistant     AMPICILLIN/SULBACTAM 4 SENSITIVE Sensitive     PIP/TAZO <=4 SENSITIVE Sensitive     * >=100,000 COLONIES/mL PROTEUS MIRABILIS  Culture, bal-quantitative     Status: Abnormal   Collection Time: 03/07/16  4:08 PM  Result Value Ref Range Status   Specimen Description BRONCHIAL ALVEOLAR LAVAGE  Final   Special Requests NONE  Final   Gram Stain   Final    ABUNDANT WBC PRESENT, PREDOMINANTLY  PMN RARE GRAM NEGATIVE RODS RARE GRAM POSITIVE COCCI IN CLUSTERS    Culture (A)  Final    70,000 COLONIES/mL METHICILLIN RESISTANT STAPHYLOCOCCUS AUREUS 50,000 COLONIES/mL PSEUDOMONAS AERUGINOSA    Report Status 03/10/2016 FINAL  Final   Organism ID, Bacteria PSEUDOMONAS AERUGINOSA (A)  Final   Organism ID, Bacteria METHICILLIN RESISTANT STAPHYLOCOCCUS AUREUS (A)  Final      Susceptibility   Methicillin resistant staphylococcus aureus - MIC*    CIPROFLOXACIN >=8 RESISTANT Resistant     ERYTHROMYCIN >=8 RESISTANT Resistant     GENTAMICIN <=0.5 SENSITIVE  Sensitive     OXACILLIN >=4 RESISTANT Resistant     TETRACYCLINE <=1 SENSITIVE Sensitive     VANCOMYCIN <=0.5 SENSITIVE Sensitive     TRIMETH/SULFA <=10 SENSITIVE Sensitive     CLINDAMYCIN >=8 RESISTANT Resistant     RIFAMPIN <=0.5 SENSITIVE Sensitive     Inducible Clindamycin NEGATIVE Sensitive     * 70,000 COLONIES/mL METHICILLIN RESISTANT STAPHYLOCOCCUS AUREUS   Pseudomonas aeruginosa - MIC*    CEFTAZIDIME 4 SENSITIVE Sensitive     CIPROFLOXACIN <=0.25 SENSITIVE Sensitive     GENTAMICIN <=1 SENSITIVE Sensitive     IMIPENEM 1 SENSITIVE Sensitive     PIP/TAZO 8 SENSITIVE Sensitive     CEFEPIME 2 SENSITIVE Sensitive     * 50,000 COLONIES/mL PSEUDOMONAS AERUGINOSA    RADIOLOGY STUDIES/RESULTS: Ct Chest W Contrast  Result Date: 02/22/2016 CLINICAL DATA:  45 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 Code sepsis. EXAM: CT CHEST WITH CONTRAST TECHNIQUE: Multidetector CT imaging of the chest was performed during intravenous contrast administration. CONTRAST:  75 cc ISOVUE-300 IOPAMIDOL (ISOVUE-300) INJECTION 61% COMPARISON:  Chest radiograph from earlier today. FINDINGS: Cardiovascular: Normal heart size. No significant pericardial fluid/thickening. Great vessels are normal in course and caliber. No central pulmonary emboli. Mediastinum/Nodes: No discrete thyroid nodules. Unremarkable esophagus. No pathologically enlarged axillary, mediastinal or hilar lymph nodes. Lungs/Pleura: No pneumothorax. No pleural effusion. Endotracheal tube tip is 2.8 cm above the carina. There is complete consolidation and severe volume loss in the right upper lobe. The right upper lobe bronchus is occluded with low attenuation material favoring mucous plugging. No discrete obstructing mass. There is moderate patchy consolidation and ground-glass attenuation throughout the right middle lobe, lingula and bilateral lower lobes (right greater than  left), with associated mild volume loss in the lower lobes. No significant pulmonary nodules in the limited aerated portions of the lungs. Upper abdomen: Enteric tube terminates in the gastric fundus. Musculoskeletal:  No aggressive appearing focal osseous lesions. IMPRESSION: 1. Well-positioned endotracheal and enteric tubes. 2. Complete right upper lobe consolidation and significant volume loss with occlusion of the right upper lobe bronchus by low-attenuation material, favoring mucous plugging. No discrete obstructing mass. 3. Moderate patchy consolidation and ground-glass attenuation throughout the right middle lobe, lingula and bilateral lower lobes, favoring multilobar pneumonia and/or aspiration. Electronically Signed   By: Ilona Sorrel M.D.   On: 02/22/2016 10:41   Ir Fluoro Guide Cv Line Left  Result Date: 02/23/2016 INDICATION: 46 year old female with multi lobar pneumonia. EXAM: IR LEFT FLOURO GUIDE CV LINE; IR ULTRASOUND GUIDANCE VASC ACCESS LEFT MEDICATIONS: None ANESTHESIA/SEDATION: None FLUOROSCOPY TIME:  Fluoroscopy Time: 0 minutes 6 seconds (1 mGy). COMPLICATIONS: None PROCEDURE: Informed written consent was obtained from the patient's family after a thorough discussion of the procedural risks, benefits and alternatives. All questions were addressed. Maximal Sterile Barrier Technique was utilized including caps, mask, sterile gowns, sterile gloves, sterile  drape, hand hygiene and skin antiseptic. A timeout was performed prior to the initiation of the procedure. Ultrasound survey of the left jugular region was performed with images stored and sent to PACs. The patient is prepped and draped in the usual sterile fashion. Skin and subcutaneous tissues were generously infiltrated 1% lidocaine for local anesthesia. Using ultrasound guidance, micropuncture set was used access the left internal jugular vein. Micro wire was passed through the needle into the right heart. Small incision was made in the  needle was removed. Peel-away sheath was then placed, and a length of 19 cm was estimated. Triple-lumen 5 French catheter was then ligated at 19 cm and passed through the peel-away sheath. Peel-away sheath was removed and the catheter was sutured in position and flushed. Final image was stored. Patient tolerated the procedure well and remained hemodynamically stable throughout. No complications were encountered and no significant blood loss. IMPRESSION: Status post left IJ central catheter placement. Catheter ready for use. Signed, Dulcy Fanny. Earleen Newport, DO [facility] Electronically Signed   By: Corrie Mckusick D.O.   On: 02/23/2016 17:03   Ir US Guide Vasc Access Left  Result Date: 02/23/2016 INDICATION: 46 year old female with multi lobar pneumonia. EXAM: IR LEFT FLOURO GUIDE CV LINE; IR ULTRASOUND GUIDANCE VASC ACCESS LEFT MEDICATIONS: None ANESTHESIA/SEDATION: None FLUOROSCOPY TIME:  Fluoroscopy Time: 0 minutes 6 seconds (1 mGy). COMPLICATIONS: None PROCEDURE: Informed written consent was obtained from the patient's family after a thorough discussion of the procedural risks, benefits and alternatives. All questions were addressed. Maximal Sterile Barrier Technique was utilized including caps, mask, sterile gowns, sterile gloves, sterile drape, hand hygiene and skin antiseptic. A timeout was performed prior to the initiation of the procedure. Ultrasound survey of the left jugular region was performed with images stored and sent to PACs. The patient is prepped and draped in the usual sterile fashion. Skin and subcutaneous tissues were generously infiltrated 1% lidocaine for local anesthesia. Using ultrasound guidance, micropuncture set was used access the left internal jugular vein. Micro wire was passed through the needle into the right heart. Small incision was made in the needle was removed. Peel-away sheath was then placed, and a length of 19 cm was  estimated. Triple-lumen 5 French catheter was then ligated at 19 cm and passed through the peel-away sheath. Peel-away sheath was removed and the catheter was sutured in position and flushed. Final image was stored. Patient tolerated the procedure well and remained hemodynamically stable throughout. No complications were encountered and no significant blood loss. IMPRESSION: Status post left IJ central catheter placement. Catheter ready for use. Signed, Dulcy Fanny. Earleen Newport, DO [facility] Electronically Signed   By: Corrie Mckusick D.O.   On: 02/23/2016 17:03   Dg Chest Port 1 View  Result Date: 03/12/2016 CLINICAL DATA:  Acute respiratory failure with hypoxia. Tracheostomy patient. EXAM: PORTABLE CHEST 1 VIEW COMPARISON:  Portable chest x-ray of June 05, 2016 and March 04, 2016. FINDINGS: The lungs remain mildly hypoinflated. Patchy density at the left lung base persists but there has been marked improvement since a study of March 04, 2016. The left hemidiaphragm is mildly elevated. The heart and pulmonary vascularity are normal. The tracheostomy appliance tip projects at the superior margin of the clavicular heads. The left internal jugular venous catheter tip projects over the midportion of the SVC. The bony thorax exhibits no acute abnormality. IMPRESSION: Further interval improvement in the appearance of the left lower lobe pneumonia.  Electronically Signed   By: David  Martinique M.D.   On: 03/12/2016 12:46   Dg Chest Port 1 View  Result Date: 03/07/2016 CLINICAL DATA:  Respiratory failure. EXAM: PORTABLE CHEST 1 VIEW COMPARISON:  03/04/2016. FINDINGS: Interim placement of tracheostomy tube. Left IJ line in stable position. Heart size stable. Low lung volumes with mild bibasilar infiltrates. Small left pleural effusion cannot be excluded. Elevation left hemidiaphragm again noted. IMPRESSION: 1. Tracheostomy tube in good anatomic position. Left IJ line in  good anatomic position. 2. Low lung volumes. Persistent left base infiltrate. No change from prior exam. Electronically Signed   By: Marcello Moores  Register   On: 03/07/2016 16:09   Dg Chest Port 1 View  Result Date: 03/07/2016 CLINICAL DATA:  Intubated patient, acute respiratory failure, pneumonia EXAM: PORTABLE CHEST 1 VIEW COMPARISON:  Portable chest x-ray of March 04, 2016 FINDINGS: The lungs are borderline hypoinflated. The retrocardiac region on the left remains dense with obscuration of the mildly elevated hemidiaphragm. The heart is not enlarged. The pulmonary vascularity is not engorged. The endotracheal tube tip lies 5.6 cm above the carina. The left internal jugular venous catheter tip projects over the midportion of the SVC. IMPRESSION: Persistent left lower lobe atelectasis or pneumonia somewhat improved since the previous study. Probable small left pleural effusion. The support devices are in stable position. Electronically Signed   By: David  Martinique M.D.   On: 03/07/2016 06:49   Dg Chest Port 1 View  Result Date: 03/04/2016 CLINICAL DATA:  Acute respiratory failure. EXAM: PORTABLE CHEST 1 VIEW COMPARISON:  03/01/2016 FINDINGS: Endotracheal tube is in place with tip 4.1 cm above the carina. Left IJ central line tip to the superior vena cava. There is elevation of left hemidiaphragm. There is increasing opacity within the left lower lobe. No pulmonary edema. Right lung remains clear. IMPRESSION: Increasing left lower lobe infiltrate. Stable elevation of left hemidiaphragm. Electronically Signed   By: Nolon Nations M.D.   On: 03/04/2016 07:25   Dg Chest Port 1 View  Result Date: 03/01/2016 CLINICAL DATA:  Hypoxia EXAM: PORTABLE CHEST 1 VIEW COMPARISON:  February 29, 2016 FINDINGS: Endotracheal tube tip is 3.2 cm above the carina. Central catheter tip is at the cavoatrial junction. No pneumothorax. There is patchy consolidation in the left base with small left pleural effusion. Right lung is  clear. Heart is upper normal in size with pulmonary vascularity within normal limits. No adenopathy. No bone lesions. IMPRESSION: Tube and catheter positions as described without pneumothorax. Consolidation left base with small left pleural effusion. Right lung clear. Electronically Signed   By: Lowella Grip III M.D.   On: 03/01/2016 07:50   Dg Chest Port 1 View  Result Date: 02/29/2016 CLINICAL DATA:  Hypoxia EXAM: PORTABLE CHEST 1 VIEW COMPARISON:  February 28, 2016 FINDINGS: Endotracheal tube tip is 2.3 cm above the carina. Central catheter tip is at the cavoatrial junction. No pneumothorax. There is mild bibasilar atelectasis. Lungs elsewhere clear. Heart size and pulmonary vascularity are normal. No adenopathy. No bone lesions. IMPRESSION: Tube and catheter positions as described without pneumothorax. Bibasilar atelectasis. No frank edema or consolidation. Stable cardiac silhouette. Electronically Signed   By: Lowella Grip III M.D.   On: 02/29/2016 07:07   Dg Chest Port 1 View  Result Date: 02/28/2016 CLINICAL DATA:  Respiratory failure.  Shortness of breath. EXAM: PORTABLE CHEST 1 VIEW COMPARISON:  02/27/2016 . FINDINGS: Endotracheal tube in stable position. Low lung volumes with mild bibasilar atelectasis. Interim improvement from prior exam.  Left base atelectasis/ infiltrate has cleared. Interim near complete resolution of small bilateral pleural effusions. No pneumothorax . IMPRESSION: 1. Lines and tubes in stable position. 2. Low lung volumes with mild bibasilar subsegmental atelectasis. Interim significant improvement from prior exam. Interim near complete resolution of small bilateral pleural effusions . Electronically Signed   By: Marcello Moores  Register   On: 02/28/2016 06:54   Dg Chest Port 1 View  Result Date: 02/27/2016 CLINICAL DATA:  Respiratory failure. EXAM: PORTABLE CHEST 1 VIEW COMPARISON:  02/26/2016. FINDINGS: Endotracheal tube, left IJ line in stable position. Heart size  normal. Low lung volumes with basilar atelectasis. Developing left base infiltrate. Tiny pleural effusions cannot be excluded. No pneumothorax . IMPRESSION: 1. Lines and tubes in stable position. 2. Low lung volumes with basilar atelectasis. Developing left lower lobe infiltrate. Small bilateral pleural effusions cannot be excluded. Electronically Signed   By: Marcello Moores  Register   On: 02/27/2016 07:13   Dg Chest Port 1 View  Result Date: 02/26/2016 CLINICAL DATA:  Acute respiratory failure. EXAM: PORTABLE CHEST 1 VIEW COMPARISON:  02/25/2016 FINDINGS: Endotracheal tube has tip 3.6 cm above the carina. Left IJ central venous catheters unchanged with tip over the right atrium. Lungs are hypoinflated with linear atelectasis over the right midlung. Mild patchy opacification over the medial right upper lobe. Mild stable cardiomegaly. Remainder of the exam is unchanged. IMPRESSION: Mild opacification over the medial right upper lobe as cannot exclude residual infection. Minimal linear atelectasis right midlung. Tubes and lines as described. Electronically Signed   By: Marin Olp M.D.   On: 02/26/2016 07:28   Dg Chest Port 1 View  Result Date: 02/25/2016 CLINICAL DATA:  Acute respiratory failure.  Endotracheal tube. EXAM: PORTABLE CHEST 1 VIEW COMPARISON:  02/24/2016 FINDINGS: Endotracheal tube and left IJ central venous catheter are unchanged. Lungs are hypoinflated with minimal right base opacification unchanged likely atelectasis. Subtle prominence of the perihilar markings. Cardiomediastinal silhouette and remainder of the exam is unchanged. IMPRESSION: Stable mild right base opacification likely atelectasis. Suggestion of minimal vascular congestion. Tubes and lines unchanged. Electronically Signed   By: Marin Olp M.D.   On: 02/25/2016 08:01   Dg Chest Port 1 View  Result Date: 02/24/2016 CLINICAL DATA:  Acute respiratory failure. EXAM: PORTABLE CHEST 1 VIEW COMPARISON:  02/23/2016 and chest CT  02/22/2016 FINDINGS: Endotracheal tube unchanged. Interval placement of left IJ central venous catheter which has tip at the cavoatrial junction. Patient slightly rotated to the right. Lungs are hypoinflated with mild stable bibasilar opacification which may be due to atelectasis or infection. Increased density in the right hilar region slightly worse. No pneumothorax. Cardiomediastinal silhouette and remainder of the exam is unchanged. IMPRESSION: Persistent bibasilar opacification with slight worsening right hilar opacification suggesting atelectasis or infection. Tubes and lines as described. Electronically Signed   By: Marin Olp M.D.   On: 02/24/2016 08:44   Dg Chest Port 1 View  Result Date: 02/23/2016 CLINICAL DATA:  Initial evaluation for acute respiratory failure. EXAM: PORTABLE CHEST 1 VIEW COMPARISON:  Prior radiograph from 02/22/2016. FINDINGS: Patient remains intubated with the tip of an endotracheal tube positioned 2.9 cm above the carina. Cardiac and mediastinal silhouettes are stable, and remain within normal limits. Lungs are hypoinflated. Patchy bibasilar opacities, right greater than left, again suspicious for possible multi lobar pneumonia. Overall, these are slightly more dense as compared to the prior exam. Underlying pulmonary interstitial edema is improved. Asymmetric prominence of the pulmonary vasculature within the right lung favored to be  related to patient rotation on this exam. No pneumothorax. No acute osseous abnormality. IMPRESSION: 1. Tip of the endotracheal tube 2.9 cm above the carina. 2. Shallow lung inflation with persistent right greater than left bibasilar opacities, compatible with multi lobar pneumonia. Overall, these are slightly more dense in appearance as compared to previous exam. 3. Improved/resolved underlying pulmonary interstitial edema. Electronically Signed   By: Jeannine Boga M.D.   On: 02/23/2016 06:30   Dg Chest Port 1 View  Result Date:  02/22/2016 CLINICAL DATA:  Respiratory failure. EXAM: PORTABLE CHEST 1 VIEW COMPARISON:  CT chest 02/22/2016 FINDINGS: Endotracheal tube with the tip 4.5 cm above the carina. Patchy areas of airspace disease in the right upper and lower lobes. Patchy airspace disease in the left lower lobe. No pleural effusion or pneumothorax. Stable cardiomediastinal silhouette. IMPRESSION: 1. Endotracheal tube with the tip 4.5 cm above the carina. 2. Airspace disease in the right upper, right lower and left lower lobes most consistent with multi lobar pneumonia. Electronically Signed   By: Kathreen Devoid   On: 02/22/2016 16:03   Dg Chest Portable 1 View  Result Date: 02/22/2016 CLINICAL DATA:  46 year old female status post ET tube placement. Patient's septic. EXAM: PORTABLE CHEST 1 VIEW COMPARISON:  Chest radiograph dated 07/28/2015 FINDINGS: An endotracheal tube is seen with tip approximately 3 cm above the carina. An enteric tube extends into the left upper abdomen with tip and side-port likely in the proximal stomach. There is near complete consolidative changes of the right upper lobe with collapse. There is near complete consolidative changes of the right middle lobe. The lucent area within the right hemithorax likely represents and aerated portion of the lung. A pneumothorax is less likely as there appears to be lung markings, but not entirely excluded. Further evaluation with CT is recommended. The left lung is clear.  The osseous structures are intact. IMPRESSION: Endotracheal tube above the carina. Large consolidative areas primarily in the right upper lobe and right middle lobe. A centrally obstructing mass or endobronchial lesion is not excluded. CT is recommended for further evaluation. Probable aerated portion of the right lower lobe versus less likely a pneumothorax. These results were called by telephone at the time of interpretation on 02/22/2016 at 6:45 am to Dr. Pryor Curia , who verbally acknowledged these  results. Electronically Signed   By: Anner Crete M.D.   On: 02/22/2016 06:47     LOS: 21 days   Oren Binet, MD  [facility] Pager:[phone]  If 7PM-7AM, please contact night-coverage [email or web address] Password [email or web address] 03/14/2016, 11:22 AM

## 2016-03-15 ENCOUNTER — Encounter (HOSPITAL_COMMUNITY): Payer: Self-pay | Admitting: *Deleted

## 2016-03-15 DIAGNOSIS — N3000 Acute cystitis without hematuria: Secondary | ICD-10-CM

## 2016-03-15 LAB — GLUCOSE, CAPILLARY
GLUCOSE-CAPILLARY: 108 mg/dL — AB (ref 65–99)
GLUCOSE-CAPILLARY: 138 mg/dL — AB (ref 65–99)
GLUCOSE-CAPILLARY: 101 mg/dL — AB (ref 65–99)
GLUCOSE-CAPILLARY: 105 mg/dL — AB (ref 65–99)
GLUCOSE-CAPILLARY: 100 mg/dL — AB (ref 65–99)
Glucose-Capillary: 110 mg/dL — ABNORMAL HIGH (ref 65–99)
Glucose-Capillary: 111 mg/dL — ABNORMAL HIGH (ref 65–99)

## 2016-03-15 NOTE — Progress Notes (Signed)
PROGRESS NOTE        PATIENT DETAILS Name: [name] [name] Age: 46 y.o. Sex: female Date of Birth: 10-11-1970 Admit Date: 02/22/2016 Admitting Physician Marshell Garfinkel, MD WYB:RKVT, SAAD, MD  Brief Narrative: Patient is a 46 y.o. female with past medical history of frontotemporal dementia of an resident of a skilled nursing facility-apparently contracted and aphasic at baseline-brought to the hospital on 02/22/16 with acute respiratory failure from multilobar pneumonia. She was intubated on admission, and was difficult to wean and subsequently required a tracheostomy on 1/4. She is still ventilator dependent. Hospital course was complicated by development of multiple electrolyte abnormalities, and possible C. difficile colitis. See below for further details  Subjective: Unchanged-not responding-aphasic. Not following commands. Not in acute distres  Assessment/Plan: Acute hypoxemic respiratory failure: Secondary to pneumonia.Intubated and unable to wean, tracheostomy placed 1/. [facility] M following for vent/trach care. Spoke with social work-needs vent-SNF at discharge, social work looking for numerous facilities, but as of now has a bed at [facility] on 03/21/2016  Septic shock secondary to multilobar pneumonia, C Diff colitis, ESBL UTI: Sepsis pathophysiology has resolved. Leukocytosis is now downtrending, she is afebrile. Her blood cultures on 1/2 are negative, BAL culture on 1/4 positive for Pseudomonas and MRSA, urine culture positive for ESBL Escherichia coli and Proteus. Remains on empiric vancomycin and meropenem with stop date of 1/20. Furthermore, hospital course was complicated by diarrhea-thought to be due to a combination of normal virus and C. difficile colitis and remains on vancomycin (stop date 1/27-with treatment extended for 1 week following completion of tx for MRSA/Pseudomonas) through the PEG tube.  Anemia: Likely secondary to acute illness,  follow.  Chronic dysphagia: Has a PEG tube in place.  History of frontotemporal dementia: Reviewed H&P-contracted, not verbal and bedbound at discharge. Resident of SNF.  Palliative care/goals of care: Very poor prognoses-seen by palliative care during this hospital stay, family still desires full aggressive care in spite of poor overall prognoses.  DVT Prophylaxis: Prophylactic Lovenox   Code Status: Full code   Family Communication: None at bedside  Disposition Plan: Remain inpatient-SNF on discharge-when bed available. Per SW-not a LTACH candidate  Antimicrobial agents: Anti-infectives    Start     Dose/Rate Route Frequency Ordered Stop   03/14/16 1600  vancomycin (VANCOCIN) IVPB 750 mg/150 ml premix     750 mg 150 mL/hr over 60 Minutes Intravenous Every 12 hours 03/14/16 0526 03/23/16 2359   03/10/16 1400  vancomycin (VANCOCIN) 50 mg/mL oral solution 125 mg     125 mg Oral 4 times daily 03/10/16 1230 03/30/16 2359   03/10/16 0400  vancomycin (VANCOCIN) 500 mg in sodium chloride 0.9 % 100 mL IVPB  Status:  Discontinued     500 mg 100 mL/hr over 60 Minutes Intravenous Every 12 hours 03/09/16 1524 03/14/16 0526   03/09/16 1600  meropenem (MERREM) 1 g in sodium chloride 0.9 % 100 mL IVPB     1 g 200 mL/hr over 30 Minutes Intravenous Every 8 hours 03/09/16 1524 03/23/16 2359   03/09/16 1600  vancomycin (VANCOCIN) IVPB 1000 mg/200 mL premix     1,000 mg 200 mL/hr over 60 Minutes Intravenous  Once 03/09/16 1524 03/09/16 1748   03/08/16 1100  cefTAZidime (FORTAZ) 1 g in dextrose 5 % 50 mL IVPB  Status:  Discontinued     1 g  100 mL/hr over 30 Minutes Intravenous Every 12 hours 03/08/16 0945 03/09/16 1511   03/07/16 1400  vancomycin (VANCOCIN) 50 mg/mL oral solution 125 mg  Status:  Discontinued     125 mg Oral 4 times daily 03/07/16 1043 03/09/16 1518   03/06/16 1300  cefTRIAXone (ROCEPHIN) 1 g in dextrose 5 % 50 mL IVPB  Status:  Discontinued     1 g 100 mL/hr over 30 Minutes  Intravenous Every 24 hours 03/06/16 1158 03/08/16 0945   02/26/16 1700  vancomycin (VANCOCIN) IVPB 1000 mg/200 mL premix  Status:  Discontinued     1,000 mg 200 mL/hr over 60 Minutes Intravenous Every 8 hours 02/26/16 1026 02/26/16 1028   02/26/16 1030  ciprofloxacin (CIPRO) IVPB 400 mg  Status:  Discontinued     400 mg 200 mL/hr over 60 Minutes Intravenous Every 12 hours 02/26/16 1026 03/02/16 0949   02/25/16 1930  vancomycin (VANCOCIN) IVPB 1000 mg/200 mL premix  Status:  Discontinued     1,000 mg 200 mL/hr over 60 Minutes Intravenous Every 8 hours 02/25/16 1858 02/26/16 1017   02/24/16 1915  vancomycin (VANCOCIN) IVPB 1000 mg/200 mL premix  Status:  Discontinued     1,000 mg 200 mL/hr over 60 Minutes Intravenous Every 8 hours 02/24/16 1106 02/25/16 0927   02/24/16 1115  vancomycin (VANCOCIN) 1,250 mg in sodium chloride 0.9 % 250 mL IVPB     1,250 mg 166.7 mL/hr over 90 Minutes Intravenous  Once 02/24/16 1106 02/24/16 1430   02/22/16 1800  vancomycin (VANCOCIN) IVPB 750 mg/150 ml premix  Status:  Discontinued     750 mg 150 mL/hr over 60 Minutes Intravenous Every 8 hours 02/22/16 0949 02/24/16 1106   02/22/16 1430  vancomycin (VANCOCIN) 50 mg/mL oral solution 125 mg     125 mg Oral 4 times daily 02/22/16 1420 03/07/16 1359   02/22/16 1400  piperacillin-tazobactam (ZOSYN) IVPB 3.375 g  Status:  Discontinued     3.375 g 12.5 mL/hr over 240 Minutes Intravenous Every 8 hours 02/22/16 0949 02/26/16 0955   02/22/16 1030  vancomycin (VANCOCIN) 500 mg in sodium chloride 0.9 % 100 mL IVPB     500 mg 100 mL/hr over 60 Minutes Intravenous  Once 02/22/16 0949 02/22/16 1212   02/22/16 0900  azithromycin (ZITHROMAX) 500 mg in dextrose 5 % 250 mL IVPB  Status:  Discontinued     500 mg 250 mL/hr over 60 Minutes Intravenous Every 24 hours 02/22/16 0846 02/24/16 0859   02/22/16 0615  piperacillin-tazobactam (ZOSYN) IVPB 3.375 g     3.375 g 100 mL/hr over 30 Minutes Intravenous  Once 02/22/16 0601  02/22/16 0708   02/22/16 0615  vancomycin (VANCOCIN) IVPB 1000 mg/200 mL premix     1,000 mg 200 mL/hr over 60 Minutes Intravenous  Once 02/22/16 0601 02/22/16 0748      Procedures: ETT 12/21>>1/4 Trach 1/4 >> Left IJ 12/22 >> Foley >> PEG >> Rectal tube >>  CONSULTS: PCCM  Palliative Care  Time spent: 25- minutes-Greater than 50% of this time was spent in counseling, explanation of diagnosis, planning of further management, and coordination of care.  MEDICATIONS: Scheduled Meds: . chlorhexidine gluconate (MEDLINE KIT)  15 mL Mouth Rinse BID  . enoxaparin (LOVENOX) injection  40 mg Subcutaneous Q24H  . famotidine  20 mg Per Tube BID  . free water  200 mL Per Tube Q6H  . furosemide  20 mg Intravenous Daily  . insulin aspart  2-6 Units Subcutaneous Q4H  .  mouth rinse  15 mL Mouth Rinse QID  . meropenem (MERREM) IV  1 g Intravenous Q8H  . vancomycin  125 mg Oral QID  . vancomycin  750 mg Intravenous Q12H   Continuous Infusions: . feeding supplement (VITAL AF 1.2 CAL) 1,000 mL (03/15/16 0302)   PRN Meds:.sodium chloride, acetaminophen, albuterol, ondansetron (ZOFRAN) IV   PHYSICAL EXAM: Vital signs: Vitals:   03/15/16 0500 03/15/16 0600 03/15/16 0804 03/15/16 0815  BP: (!) 84/58 (!) 87/58 90/69   Pulse: 83 85 87   Resp: _0 Temp:    98.9 F (37.2 C)  TempSrc:    Oral  SpO2: 100% 100% 100%   Weight:      Height:       Filed Weights   03/13/16 0431 03/14/16 0406 03/15/16 0403  Weight: 64.8 kg (142 lb 13.7 oz) 62.2 kg (137 lb 2 oz) 63.9 kg (140 lb 14 oz)   Body mass index is 23.44 kg/m.   General exam: Lethargic, does not respond to voice. Not following any commands Respiratory system: Vent-trach. Course breath sounds  Cardiovascular system: S1 & S2 heard, tachycardic, regular. No JVD, murmurs, rubs, gallops or clicks.  Gastrointestinal system: Abdomen is nondistended, soft and nontender.Peg tube in pace Central nervous system: does not respond to  voice  Extremities: Symmetric, contracted at knees, b/l wrist drop Skin: No rashes, lesions or ulcers on exposed skin    I have personally reviewed following labs and imaging studies  LABORATORY DATA: CBC:  Recent Labs Lab 03/10/16 0417 03/11/16 0655 03/12/16 0429 03/13/16 0438 03/14/16 0400  WBC 18.3* 16.3* 14.7* 14.7* 14.6*  NEUTROABS 12.4* 10.7* 9.8* 9.2* 9.3*  HGB 10.4* 9.6* 9.3* 9.2* 8.8*  HCT 33.4* 30.9* 30.2* 29.7* 28.9*  MCV 83.3 84.7 83.9 85.1 85.0  PLT 437* PLATELET CLUMPS NOTED ON SMEAR, COUNT APPEARS ADEQUATE 429* 399 762    Basic Metabolic Panel:  Recent Labs Lab 03/10/16 0417 03/11/16 0655 03/12/16 0429 03/13/16 0438 03/14/16 0400  NA 142 143 142 143 142  K 3.3* 3.9 3.4* 3.6 3.8  CL 106 104 103 105 103  CO2 _1 GLUCOSE 124* 107* 115* 113* 114*  BUN 35* 34* 31* 27* 26*  CREATININE 0.82 0.79 0.79 0.71 0.67  CALCIUM 9.0 8.9 9.0 9.1 8.9    GFR: Estimated Creatinine Clearance: 79.1 mL/min (by C-G formula based on SCr of 0.67 mg/dL).  Liver Function Tests: No results for input(s): AST, ALT, ALKPHOS, BILITOT, PROT, ALBUMIN in the last 168 hours. No results for input(s): LIPASE, AMYLASE in the last 168 hours. No results for input(s): AMMONIA in the last 168 hours.  Coagulation Profile: No results for input(s): INR, PROTIME in the last 168 hours.  Cardiac Enzymes: No results for input(s): CKTOTAL, CKMB, CKMBINDEX, TROPONINI in the last 168 hours.  BNP (last 3 results) No results for input(s): PROBNP in the last 8760 hours.  HbA1C: No results for input(s): HGBA1C in the last 72 hours.  CBG:  Recent Labs Lab 03/14/16 1540 03/14/16 1950 03/14/16 2326 03/15/16 0341 03/15/16 0809  GLUCAP 105* 106* 108* 138* 101*    Lipid Profile: No results for input(s): CHOL, HDL, LDLCALC, TRIG, CHOLHDL, LDLDIRECT in the last 72 hours.  Thyroid Function Tests: No results for input(s): TSH, T4TOTAL, FREET4, T3FREE, THYROIDAB in the last 72  hours.  Anemia Panel: No results for input(s): VITAMINB12, FOLATE, FERRITIN, TIBC, IRON, RETICCTPCT in the last 72 hours.  Urine analysis:  Component Value Date/Time   COLORURINE YELLOW 03/05/2016 1725   APPEARANCEUR HAZY (A) 03/05/2016 1725   LABSPEC 1.019 03/05/2016 1725   PHURINE 5.0 03/05/2016 1725   GLUCOSEU NEGATIVE 03/05/2016 1725   HGBUR SMALL (A) 03/05/2016 1725   BILIRUBINUR NEGATIVE 03/05/2016 1725   KETONESUR NEGATIVE 03/05/2016 1725   PROTEINUR 100 (A) 03/05/2016 1725   UROBILINOGEN 1.0 10/17/2013 1957   NITRITE NEGATIVE 03/05/2016 1725   LEUKOCYTESUR LARGE (A) 03/05/2016 1725    Sepsis Labs: Lactic Acid, Venous    Component Value Date/Time   LATICACIDVEN 2.9 (HH) 02/22/2016 1539    MICROBIOLOGY: Recent Results (from the past 240 hour(s))  Culture, blood (Routine X 2) w Reflex to ID Panel     Status: None   Collection Time: 03/05/16  7:07 PM  Result Value Ref Range Status   Specimen Description BLOOD LEFT HAND  Final   Special Requests BOTTLES DRAWN AEROBIC AND ANAEROBIC 5CC EACH  Final   Culture NO GROWTH 5 DAYS  Final   Report Status 03/10/2016 FINAL  Final  Culture, blood (Routine X 2) w Reflex to ID Panel     Status: None   Collection Time: 03/05/16  7:08 PM  Result Value Ref Range Status   Specimen Description BLOOD RIGHT HAND  Final   Special Requests BOTTLES DRAWN AEROBIC AND ANAEROBIC 5CC EACH  Final   Culture NO GROWTH 5 DAYS  Final   Report Status 03/10/2016 FINAL  Final  Culture, Urine     Status: Abnormal   Collection Time: 03/06/16 11:30 AM  Result Value Ref Range Status   Specimen Description URINE, RANDOM  Final   Special Requests NONE  Final   Culture (A)  Final    >=100,000 COLONIES/mL PROTEUS MIRABILIS >=100,000 COLONIES/mL ESCHERICHIA COLI Confirmed Extended Spectrum Beta-Lactamase Producer (ESBL)    Report Status 03/09/2016 FINAL  Final   Organism ID, Bacteria PROTEUS MIRABILIS (A)  Final   Organism ID, Bacteria ESCHERICHIA  COLI (A)  Final      Susceptibility   Escherichia coli - MIC*    AMPICILLIN >=32 RESISTANT Resistant     CEFAZOLIN >=64 RESISTANT Resistant     CEFTRIAXONE >=64 RESISTANT Resistant     CIPROFLOXACIN >=4 RESISTANT Resistant     GENTAMICIN <=1 SENSITIVE Sensitive     IMIPENEM <=0.25 SENSITIVE Sensitive     NITROFURANTOIN <=16 SENSITIVE Sensitive     TRIMETH/SULFA <=20 SENSITIVE Sensitive     AMPICILLIN/SULBACTAM 16 INTERMEDIATE Intermediate     PIP/TAZO <=4 SENSITIVE Sensitive     Extended ESBL POSITIVE Resistant     * >=100,000 COLONIES/mL ESCHERICHIA COLI   Proteus mirabilis - MIC*    AMPICILLIN >=32 RESISTANT Resistant     CEFAZOLIN <=4 SENSITIVE Sensitive     CEFTRIAXONE <=1 SENSITIVE Sensitive     CIPROFLOXACIN >=4 RESISTANT Resistant     GENTAMICIN <=1 SENSITIVE Sensitive     IMIPENEM 4 SENSITIVE Sensitive     NITROFURANTOIN 128 RESISTANT Resistant     TRIMETH/SULFA 160 RESISTANT Resistant     AMPICILLIN/SULBACTAM 4 SENSITIVE Sensitive     PIP/TAZO <=4 SENSITIVE Sensitive     * >=100,000 COLONIES/mL PROTEUS MIRABILIS  Culture, bal-quantitative     Status: Abnormal   Collection Time: 03/07/16  4:08 PM  Result Value Ref Range Status   Specimen Description BRONCHIAL ALVEOLAR LAVAGE  Final   Special Requests NONE  Final   Gram Stain   Final    ABUNDANT WBC PRESENT, PREDOMINANTLY PMN RARE GRAM NEGATIVE  RODS RARE GRAM POSITIVE COCCI IN CLUSTERS    Culture (A)  Final    70,000 COLONIES/mL METHICILLIN RESISTANT STAPHYLOCOCCUS AUREUS 50,000 COLONIES/mL PSEUDOMONAS AERUGINOSA    Report Status 03/10/2016 FINAL  Final   Organism ID, Bacteria PSEUDOMONAS AERUGINOSA (A)  Final   Organism ID, Bacteria METHICILLIN RESISTANT STAPHYLOCOCCUS AUREUS (A)  Final      Susceptibility   Methicillin resistant staphylococcus aureus - MIC*    CIPROFLOXACIN >=8 RESISTANT Resistant     ERYTHROMYCIN >=8 RESISTANT Resistant     GENTAMICIN <=0.5 SENSITIVE Sensitive     OXACILLIN >=4 RESISTANT  Resistant     TETRACYCLINE <=1 SENSITIVE Sensitive     VANCOMYCIN <=0.5 SENSITIVE Sensitive     TRIMETH/SULFA <=10 SENSITIVE Sensitive     CLINDAMYCIN >=8 RESISTANT Resistant     RIFAMPIN <=0.5 SENSITIVE Sensitive     Inducible Clindamycin NEGATIVE Sensitive     * 70,000 COLONIES/mL METHICILLIN RESISTANT STAPHYLOCOCCUS AUREUS   Pseudomonas aeruginosa - MIC*    CEFTAZIDIME 4 SENSITIVE Sensitive     CIPROFLOXACIN <=0.25 SENSITIVE Sensitive     GENTAMICIN <=1 SENSITIVE Sensitive     IMIPENEM 1 SENSITIVE Sensitive     PIP/TAZO 8 SENSITIVE Sensitive     CEFEPIME 2 SENSITIVE Sensitive     * 50,000 COLONIES/mL PSEUDOMONAS AERUGINOSA    RADIOLOGY STUDIES/RESULTS: Ct Chest W Contrast  Result Date: 02/22/2016 CLINICAL DATA:  46 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 Code sepsis. EXAM: CT CHEST WITH CONTRAST TECHNIQUE: Multidetector CT imaging of the chest was performed during intravenous contrast administration. CONTRAST:  75 cc ISOVUE-300 IOPAMIDOL (ISOVUE-300) INJECTION 61% COMPARISON:  Chest radiograph from earlier today. FINDINGS: Cardiovascular: Normal heart size. No significant pericardial fluid/thickening. Great vessels are normal in course and caliber. No central pulmonary emboli. Mediastinum/Nodes: No discrete thyroid nodules. Unremarkable esophagus. No pathologically enlarged axillary, mediastinal or hilar lymph nodes. Lungs/Pleura: No pneumothorax. No pleural effusion. Endotracheal tube tip is 2.8 cm above the carina. There is complete consolidation and severe volume loss in the right upper lobe. The right upper lobe bronchus is occluded with low attenuation material favoring mucous plugging. No discrete obstructing mass. There is moderate patchy consolidation and ground-glass attenuation throughout the right middle lobe, lingula and bilateral lower lobes (right greater than left), with associated mild volume  loss in the lower lobes. No significant pulmonary nodules in the limited aerated portions of the lungs. Upper abdomen: Enteric tube terminates in the gastric fundus. Musculoskeletal:  No aggressive appearing focal osseous lesions. IMPRESSION: 1. Well-positioned endotracheal and enteric tubes. 2. Complete right upper lobe consolidation and significant volume loss with occlusion of the right upper lobe bronchus by low-attenuation material, favoring mucous plugging. No discrete obstructing mass. 3. Moderate patchy consolidation and ground-glass attenuation throughout the right middle lobe, lingula and bilateral lower lobes, favoring multilobar pneumonia and/or aspiration. Electronically Signed   By: Ilona Sorrel M.D.   On: 02/22/2016 10:41   Ir Fluoro Guide Cv Line Left  Result Date: 02/23/2016 INDICATION: 46 year old female with multi lobar pneumonia. EXAM: IR LEFT FLOURO GUIDE CV LINE; IR ULTRASOUND GUIDANCE VASC ACCESS LEFT MEDICATIONS: None ANESTHESIA/SEDATION: None FLUOROSCOPY TIME:  Fluoroscopy Time: 0 minutes 6 seconds (1 mGy). COMPLICATIONS: None PROCEDURE: Informed written consent was obtained from the patient's family after a thorough discussion of the procedural risks, benefits and alternatives. All questions were addressed. Maximal Sterile Barrier Technique was utilized including caps, mask, sterile gowns, sterile gloves, sterile drape, hand hygiene and  skin antiseptic. A timeout was performed prior to the initiation of the procedure. Ultrasound survey of the left jugular region was performed with images stored and sent to PACs. The patient is prepped and draped in the usual sterile fashion. Skin and subcutaneous tissues were generously infiltrated 1% lidocaine for local anesthesia. Using ultrasound guidance, micropuncture set was used access the left internal jugular vein. Micro wire was passed through the needle into the right heart. Small incision was made in the needle was removed. Peel-away sheath  was then placed, and a length of 19 cm was estimated. Triple-lumen 5 French catheter was then ligated at 19 cm and passed through the peel-away sheath. Peel-away sheath was removed and the catheter was sutured in position and flushed. Final image was stored. Patient tolerated the procedure well and remained hemodynamically stable throughout. No complications were encountered and no significant blood loss. IMPRESSION: Status post left IJ central catheter placement. Catheter ready for use. Signed, Dulcy Fanny. Earleen Newport, DO [facility] Radiology Electronically Signed   By: Corrie Mckusick D.O.   On: 02/23/2016 17:03   Ir US Guide Vasc Access Left  Result Date: 02/23/2016 INDICATION: 46 year old female with multi lobar pneumonia. EXAM: IR LEFT FLOURO GUIDE CV LINE; IR ULTRASOUND GUIDANCE VASC ACCESS LEFT MEDICATIONS: None ANESTHESIA/SEDATION: None FLUOROSCOPY TIME:  Fluoroscopy Time: 0 minutes 6 seconds (1 mGy). COMPLICATIONS: None PROCEDURE: Informed written consent was obtained from the patient's family after a thorough discussion of the procedural risks, benefits and alternatives. All questions were addressed. Maximal Sterile Barrier Technique was utilized including caps, mask, sterile gowns, sterile gloves, sterile drape, hand hygiene and skin antiseptic. A timeout was performed prior to the initiation of the procedure. Ultrasound survey of the left jugular region was performed with images stored and sent to PACs. The patient is prepped and draped in the usual sterile fashion. Skin and subcutaneous tissues were generously infiltrated 1% lidocaine for local anesthesia. Using ultrasound guidance, micropuncture set was used access the left internal jugular vein. Micro wire was passed through the needle into the right heart. Small incision was made in the needle was removed. Peel-away sheath was then placed, and a length of 19 cm was estimated. Triple-lumen 5 French catheter  was then ligated at 19 cm and passed through the peel-away sheath. Peel-away sheath was removed and the catheter was sutured in position and flushed. Final image was stored. Patient tolerated the procedure well and remained hemodynamically stable throughout. No complications were encountered and no significant blood loss. IMPRESSION: Status post left IJ central catheter placement. Catheter ready for use. Signed, Dulcy Fanny. Earleen Newport, DO [facility] Radiology Electronically Signed   By: Corrie Mckusick D.O.   On: 02/23/2016 17:03   Dg Chest Port 1 View  Result Date: 03/12/2016 CLINICAL DATA:  Acute respiratory failure with hypoxia. Tracheostomy patient. EXAM: PORTABLE CHEST 1 VIEW COMPARISON:  Portable chest x-ray of June 05, 2016 and March 04, 2016. FINDINGS: The lungs remain mildly hypoinflated. Patchy density at the left lung base persists but there has been marked improvement since a study of March 04, 2016. The left hemidiaphragm is mildly elevated. The heart and pulmonary vascularity are normal. The tracheostomy appliance tip projects at the superior margin of the clavicular heads. The left internal jugular venous catheter tip projects over the midportion of the SVC. The bony thorax exhibits no acute abnormality. IMPRESSION: Further interval improvement in the appearance of the left lower lobe pneumonia. Electronically Signed  By: David  Martinique M.D.   On: 03/12/2016 12:46   Dg Chest Port 1 View  Result Date: 03/07/2016 CLINICAL DATA:  Respiratory failure. EXAM: PORTABLE CHEST 1 VIEW COMPARISON:  03/04/2016. FINDINGS: Interim placement of tracheostomy tube. Left IJ line in stable position. Heart size stable. Low lung volumes with mild bibasilar infiltrates. Small left pleural effusion cannot be excluded. Elevation left hemidiaphragm again noted. IMPRESSION: 1. Tracheostomy tube in good anatomic position. Left IJ line in good anatomic position. 2. Low lung  volumes. Persistent left base infiltrate. No change from prior exam. Electronically Signed   By: Marcello Moores  Register   On: 03/07/2016 16:09   Dg Chest Port 1 View  Result Date: 03/07/2016 CLINICAL DATA:  Intubated patient, acute respiratory failure, pneumonia EXAM: PORTABLE CHEST 1 VIEW COMPARISON:  Portable chest x-ray of March 04, 2016 FINDINGS: The lungs are borderline hypoinflated. The retrocardiac region on the left remains dense with obscuration of the mildly elevated hemidiaphragm. The heart is not enlarged. The pulmonary vascularity is not engorged. The endotracheal tube tip lies 5.6 cm above the carina. The left internal jugular venous catheter tip projects over the midportion of the SVC. IMPRESSION: Persistent left lower lobe atelectasis or pneumonia somewhat improved since the previous study. Probable small left pleural effusion. The support devices are in stable position. Electronically Signed   By: David  Martinique M.D.   On: 03/07/2016 06:49   Dg Chest Port 1 View  Result Date: 03/04/2016 CLINICAL DATA:  Acute respiratory failure. EXAM: PORTABLE CHEST 1 VIEW COMPARISON:  03/01/2016 FINDINGS: Endotracheal tube is in place with tip 4.1 cm above the carina. Left IJ central line tip to the superior vena cava. There is elevation of left hemidiaphragm. There is increasing opacity within the left lower lobe. No pulmonary edema. Right lung remains clear. IMPRESSION: Increasing left lower lobe infiltrate. Stable elevation of left hemidiaphragm. Electronically Signed   By: Nolon Nations M.D.   On: 03/04/2016 07:25   Dg Chest Port 1 View  Result Date: 03/01/2016 CLINICAL DATA:  Hypoxia EXAM: PORTABLE CHEST 1 VIEW COMPARISON:  February 29, 2016 FINDINGS: Endotracheal tube tip is 3.2 cm above the carina. Central catheter tip is at the cavoatrial junction. No pneumothorax. There is patchy consolidation in the left base with small left pleural effusion. Right lung is clear. Heart is upper normal in size with  pulmonary vascularity within normal limits. No adenopathy. No bone lesions. IMPRESSION: Tube and catheter positions as described without pneumothorax. Consolidation left base with small left pleural effusion. Right lung clear. Electronically Signed   By: Lowella Grip III M.D.   On: 03/01/2016 07:50   Dg Chest Port 1 View  Result Date: 02/29/2016 CLINICAL DATA:  Hypoxia EXAM: PORTABLE CHEST 1 VIEW COMPARISON:  February 28, 2016 FINDINGS: Endotracheal tube tip is 2.3 cm above the carina. Central catheter tip is at the cavoatrial junction. No pneumothorax. There is mild bibasilar atelectasis. Lungs elsewhere clear. Heart size and pulmonary vascularity are normal. No adenopathy. No bone lesions. IMPRESSION: Tube and catheter positions as described without pneumothorax. Bibasilar atelectasis. No frank edema or consolidation. Stable cardiac silhouette. Electronically Signed   By: Lowella Grip III M.D.   On: 02/29/2016 07:07   Dg Chest Port 1 View  Result Date: 02/28/2016 CLINICAL DATA:  Respiratory failure.  Shortness of breath. EXAM: PORTABLE CHEST 1 VIEW COMPARISON:  02/27/2016 . FINDINGS: Endotracheal tube in stable position. Low lung volumes with mild bibasilar atelectasis. Interim improvement from prior exam. Left base atelectasis/ infiltrate  has cleared. Interim near complete resolution of small bilateral pleural effusions. No pneumothorax . IMPRESSION: 1. Lines and tubes in stable position. 2. Low lung volumes with mild bibasilar subsegmental atelectasis. Interim significant improvement from prior exam. Interim near complete resolution of small bilateral pleural effusions . Electronically Signed   By: Marcello Moores  Register   On: 02/28/2016 06:54   Dg Chest Port 1 View  Result Date: 02/27/2016 CLINICAL DATA:  Respiratory failure. EXAM: PORTABLE CHEST 1 VIEW COMPARISON:  02/26/2016. FINDINGS: Endotracheal tube, left IJ line in stable position. Heart size normal. Low lung volumes with basilar  atelectasis. Developing left base infiltrate. Tiny pleural effusions cannot be excluded. No pneumothorax . IMPRESSION: 1. Lines and tubes in stable position. 2. Low lung volumes with basilar atelectasis. Developing left lower lobe infiltrate. Small bilateral pleural effusions cannot be excluded. Electronically Signed   By: Marcello Moores  Register   On: 02/27/2016 07:13   Dg Chest Port 1 View  Result Date: 02/26/2016 CLINICAL DATA:  Acute respiratory failure. EXAM: PORTABLE CHEST 1 VIEW COMPARISON:  02/25/2016 FINDINGS: Endotracheal tube has tip 3.6 cm above the carina. Left IJ central venous catheters unchanged with tip over the right atrium. Lungs are hypoinflated with linear atelectasis over the right midlung. Mild patchy opacification over the medial right upper lobe. Mild stable cardiomegaly. Remainder of the exam is unchanged. IMPRESSION: Mild opacification over the medial right upper lobe as cannot exclude residual infection. Minimal linear atelectasis right midlung. Tubes and lines as described. Electronically Signed   By: Marin Olp M.D.   On: 02/26/2016 07:28   Dg Chest Port 1 View  Result Date: 02/25/2016 CLINICAL DATA:  Acute respiratory failure.  Endotracheal tube. EXAM: PORTABLE CHEST 1 VIEW COMPARISON:  02/24/2016 FINDINGS: Endotracheal tube and left IJ central venous catheter are unchanged. Lungs are hypoinflated with minimal right base opacification unchanged likely atelectasis. Subtle prominence of the perihilar markings. Cardiomediastinal silhouette and remainder of the exam is unchanged. IMPRESSION: Stable mild right base opacification likely atelectasis. Suggestion of minimal vascular congestion. Tubes and lines unchanged. Electronically Signed   By: Marin Olp M.D.   On: 02/25/2016 08:01   Dg Chest Port 1 View  Result Date: 02/24/2016 CLINICAL DATA:  Acute respiratory failure. EXAM: PORTABLE CHEST 1 VIEW COMPARISON:  02/23/2016 and chest CT 02/22/2016 FINDINGS: Endotracheal tube  unchanged. Interval placement of left IJ central venous catheter which has tip at the cavoatrial junction. Patient slightly rotated to the right. Lungs are hypoinflated with mild stable bibasilar opacification which may be due to atelectasis or infection. Increased density in the right hilar region slightly worse. No pneumothorax. Cardiomediastinal silhouette and remainder of the exam is unchanged. IMPRESSION: Persistent bibasilar opacification with slight worsening right hilar opacification suggesting atelectasis or infection. Tubes and lines as described. Electronically Signed   By: Marin Olp M.D.   On: 02/24/2016 08:44   Dg Chest Port 1 View  Result Date: 02/23/2016 CLINICAL DATA:  Initial evaluation for acute respiratory failure. EXAM: PORTABLE CHEST 1 VIEW COMPARISON:  Prior radiograph from 02/22/2016. FINDINGS: Patient remains intubated with the tip of an endotracheal tube positioned 2.9 cm above the carina. Cardiac and mediastinal silhouettes are stable, and remain within normal limits. Lungs are hypoinflated. Patchy bibasilar opacities, right greater than left, again suspicious for possible multi lobar pneumonia. Overall, these are slightly more dense as compared to the prior exam. Underlying pulmonary interstitial edema is improved. Asymmetric prominence of the pulmonary vasculature within the right lung favored to be related to patient rotation  on this exam. No pneumothorax. No acute osseous abnormality. IMPRESSION: 1. Tip of the endotracheal tube 2.9 cm above the carina. 2. Shallow lung inflation with persistent right greater than left bibasilar opacities, compatible with multi lobar pneumonia. Overall, these are slightly more dense in appearance as compared to previous exam. 3. Improved/resolved underlying pulmonary interstitial edema. Electronically Signed   By: Jeannine Boga M.D.   On: 02/23/2016 06:30   Dg Chest Port 1 View  Result Date: 02/22/2016 CLINICAL DATA:  Respiratory  failure. EXAM: PORTABLE CHEST 1 VIEW COMPARISON:  CT chest 02/22/2016 FINDINGS: Endotracheal tube with the tip 4.5 cm above the carina. Patchy areas of airspace disease in the right upper and lower lobes. Patchy airspace disease in the left lower lobe. No pleural effusion or pneumothorax. Stable cardiomediastinal silhouette. IMPRESSION: 1. Endotracheal tube with the tip 4.5 cm above the carina. 2. Airspace disease in the right upper, right lower and left lower lobes most consistent with multi lobar pneumonia. Electronically Signed   By: Kathreen Devoid   On: 02/22/2016 16:03   Dg Chest Portable 1 View  Result Date: 02/22/2016 CLINICAL DATA:  46 year old female status post ET tube placement. Patient's septic. EXAM: PORTABLE CHEST 1 VIEW COMPARISON:  Chest radiograph dated 07/28/2015 FINDINGS: An endotracheal tube is seen with tip approximately 3 cm above the carina. An enteric tube extends into the left upper abdomen with tip and side-port likely in the proximal stomach. There is near complete consolidative changes of the right upper lobe with collapse. There is near complete consolidative changes of the right middle lobe. The lucent area within the right hemithorax likely represents and aerated portion of the lung. A pneumothorax is less likely as there appears to be lung markings, but not entirely excluded. Further evaluation with CT is recommended. The left lung is clear.  The osseous structures are intact. IMPRESSION: Endotracheal tube above the carina. Large consolidative areas primarily in the right upper lobe and right middle lobe. A centrally obstructing mass or endobronchial lesion is not excluded. CT is recommended for further evaluation. Probable aerated portion of the right lower lobe versus less likely a pneumothorax. These results were called by telephone at the time of interpretation on 02/22/2016 at 6:45 am to Dr. Pryor Curia , who verbally acknowledged these results. Electronically Signed   By:  Anner Crete M.D.   On: 02/22/2016 06:47     LOS: 46 days   Oren Binet, MD  [facility] Pager:[phone]  If 7PM-7AM, please contact night-coverage [email or web address] Password [email or web address] 03/15/2016, 9:09 AM

## 2016-03-15 NOTE — Progress Notes (Signed)
CSW received phone call from Patient's sister, Kathryne ErikssonJanet Barkett, inquiring about whether or not Patient is able to remain in the hospital until a [facility] bed becomes available in [place]. CSW explained to Ms. Rials that as previously noted, unfortunately, there are no [facility] SNF beds available in [place] at this time. [facility] Patient can arrive on next Thursday01/18/2018. CSW again explained to Ms. Gerstner that Patient must go to the first bed available to her as she has been cleared for transfer to her next point of care. CSW explained to Ms. Harriott that if she would like to eventually transfer her sister back to a [facility] SN[facility] bed in [place] [facility] [place], she will need to coordinate that with the facility as CSW does not facilitate that process.   CSW received a return phone call from Ms. Hentges inquiring about if she needs to bring Patient's clothing with her when she follows the ambulance [facility]. Patient's sister also inquiring about what time she needs to be at the hospital to follow the ambulance. CSW will coordinate time with transport and provide this information to family once received.   CSW received another phone call from Ms. Sublett who reports that she is very upset after being told by the MD that "Patient has been declined at four facilities due to [employer] insurance". CSW clarified with MD who reports that he did not tell Ms. Marano this and instead was speaking in general about barriers that may arise in Vent SNF placement including insurance barriers. Patient does not have any insurance barriers preventing her from placement, however, there are no [facility] SNF beds available in [place]  at this time. CSW relayed this information to Ms. Burress.   CSW called Ms. Lehew to reiterate all information noted again with Bedside RN and RN Case Manager present as well. CSW will continue to follow up with all Vent SNF facilities to see if  a bed becomes available between now and Wednesday 03/20/2016.    Enos FlingAshley Lianah Peed, MSW, LCSW [facility] ED/62M Clinical Social Worker [phone]

## 2016-03-15 NOTE — Progress Notes (Signed)
Patient transferred from 65M to 4NP05. VSS, LUQ peg tube feedings infusing w/o difficulty. Pt repositioned in bed, HOB >30 degrees. Foley and flexiseal intact, L IJ CVC dsg CDI, distal lumen connected to CVP and at [facility]KVO. RT at bedside, pt suctioned via in-line catheter. Trach supplies at bedside; [facility].   Full assessment to Epic, pt opens eyes spontaneously, does not follow commands, reacts to pain; all four extremities contracted. CPOT assessment completed; pt is resting quietly, does not appear to be in pain or any discomfort.  Telemetry verified with CCMD.

## 2016-03-16 LAB — BASIC METABOLIC PANEL
Anion gap: 8 (ref 5–15)
BUN: 23 mg/dL — ABNORMAL HIGH (ref 6–20)
CALCIUM: 8.8 mg/dL — AB (ref 8.9–10.3)
CHLORIDE: 99 mmol/L — AB (ref 101–111)
CO2: 31 mmol/L (ref 22–32)
CREATININE: 0.6 mg/dL (ref 0.44–1.00)
GFR calc non Af Amer: >60 mL/min (ref >60)
Glucose, Bld: 108 mg/dL — ABNORMAL HIGH (ref 65–99)
Potassium: 3.3 mmol/L — ABNORMAL LOW (ref 3.5–5.1)
SODIUM: 138 mmol/L (ref 135–145)

## 2016-03-16 LAB — CBC
HEMATOCRIT: 28.1 % — AB (ref 36.0–46.0)
HEMOGLOBIN: 8.7 g/dL — AB (ref 12.0–15.0)
MCH: 26.1 pg (ref 26.0–34.0)
MCHC: 31 g/dL (ref 30.0–36.0)
MCV: 84.4 fL (ref 78.0–100.0)
Platelets: 347 10*3/uL (ref 150–400)
RBC: 3.33 MIL/uL — ABNORMAL LOW (ref 3.87–5.11)
RDW: 16.9 % — AB (ref 11.5–15.5)
WBC: 12.5 10*3/uL — ABNORMAL HIGH (ref 4.0–10.5)

## 2016-03-16 LAB — GLUCOSE, CAPILLARY
GLUCOSE-CAPILLARY: 101 mg/dL — AB (ref 65–99)
GLUCOSE-CAPILLARY: 114 mg/dL — AB (ref 65–99)
GLUCOSE-CAPILLARY: 117 mg/dL — AB (ref 65–99)
Glucose-Capillary: 117 mg/dL — ABNORMAL HIGH (ref 65–99)
Glucose-Capillary: 115 mg/dL — ABNORMAL HIGH (ref 65–99)
Glucose-Capillary: 94 mg/dL (ref 65–99)

## 2016-03-16 MED ORDER — POTASSIUM CHLORIDE 20 MEQ/15ML (10%) PO SOLN
40.0000 meq | Freq: Once | ORAL | Status: AC
  Administered 2016-03-16: 40 meq
  Filled 2016-03-16: qty 30

## 2016-03-16 NOTE — Progress Notes (Signed)
PROGRESS NOTE        PATIENT DETAILS Name: [name] [name] Age: 46 y.o. Sex: female Date of Birth: 07/06/1970 Admit Date: 02/22/2016 Admitting Physician Marshell Garfinkel, MD PYK:DXIP, SAAD, MD  Brief Narrative: Patient is a 45 y.o. female with past medical history of frontotemporal dementia of an resident of a skilled nursing facility-apparently contracted and aphasic at baseline-brought to the hospital on 02/22/16 with acute respiratory failure from multilobar pneumonia. She was intubated on admission, and was difficult to wean and subsequently required a tracheostomy on 1/4. She is still ventilator dependent. Hospital course was complicated by development of multiple electrolyte abnormalities, and possible C. difficile colitis. See below for further details  Subjective: Unchanged-not responding-aphasic. Not following commands. Not in acute distress-spoke with RN-no major issues overnight.  Assessment/Plan: Acute hypoxemic respiratory failure: Secondary to pneumonia.Intubated and unable to wean, tracheostomy placed 1/. [facility] M following for vent/trach care. Spoke with social work-needs vent-SNF at discharge, social work looking for numerous facilities, but as of now has a bed at [facility] on 03/21/2016  Septic shock secondary to multilobar pneumonia, C Diff colitis, ESBL UTI: Sepsis pathophysiology has resolved. Leukocytosis is now downtrending, she is afebrile. Her blood cultures on 1/2 are negative, BAL culture on 1/4 positive for Pseudomonas and MRSA, urine culture positive for ESBL Escherichia coli and Proteus. Remains on empiric vancomycin and meropenem with stop date of 1/20. Furthermore, hospital course was complicated by diarrhea-thought to be due to a combination of normal virus and C. difficile colitis and remains on vancomycin (stop date 1/27-with treatment extended for 1 week following completion of tx for MRSA/Pseudomonas) through the PEG tube.  Anemia:  Likely secondary to acute illness, follow.  Chronic dysphagia: Has a PEG tube in place.  History of frontotemporal dementia: Reviewed H&P-contracted, not verbal and bedbound at discharge. Resident of SNF.  Palliative care/goals of care: Very poor prognoses-seen by palliative care during this hospital stay, family still desires full aggressive care in spite of poor overall prognoses.  DVT Prophylaxis: Prophylactic Lovenox   Code Status: Full code   Family Communication: Spoke with sister over the phone on 1/12.  Disposition Plan: Remain inpatient-SNF on discharge-when bed available. Per SW-not a LTACH candidate  Antimicrobial agents: Anti-infectives    Start     Dose/Rate Route Frequency Ordered Stop   03/14/16 1600  vancomycin (VANCOCIN) IVPB 750 mg/150 ml premix     750 mg 150 mL/hr over 60 Minutes Intravenous Every 12 hours 03/14/16 0526 03/23/16 2359   03/10/16 1400  vancomycin (VANCOCIN) 50 mg/mL oral solution 125 mg     125 mg Oral 4 times daily 03/10/16 1230 03/30/16 2359   03/10/16 0400  vancomycin (VANCOCIN) 500 mg in sodium chloride 0.9 % 100 mL IVPB  Status:  Discontinued     500 mg 100 mL/hr over 60 Minutes Intravenous Every 12 hours 03/09/16 1524 03/14/16 0526   03/09/16 1600  meropenem (MERREM) 1 g in sodium chloride 0.9 % 100 mL IVPB     1 g 200 mL/hr over 30 Minutes Intravenous Every 8 hours 03/09/16 1524 03/23/16 2359   03/09/16 1600  vancomycin (VANCOCIN) IVPB 1000 mg/200 mL premix     1,000 mg 200 mL/hr over 60 Minutes Intravenous  Once 03/09/16 1524 03/09/16 1748   03/08/16 1100  cefTAZidime (FORTAZ) 1 g in dextrose 5 % 50 mL IVPB  Status:  Discontinued     1 g 100 mL/hr over 30 Minutes Intravenous Every 12 hours 03/08/16 0945 03/09/16 1511   03/07/16 1400  vancomycin (VANCOCIN) 50 mg/mL oral solution 125 mg  Status:  Discontinued     125 mg Oral 4 times daily 03/07/16 1043 03/09/16 1518   03/06/16 1300  cefTRIAXone (ROCEPHIN) 1 g in dextrose 5 % 50 mL  IVPB  Status:  Discontinued     1 g 100 mL/hr over 30 Minutes Intravenous Every 24 hours 03/06/16 1158 03/08/16 0945   02/26/16 1700  vancomycin (VANCOCIN) IVPB 1000 mg/200 mL premix  Status:  Discontinued     1,000 mg 200 mL/hr over 60 Minutes Intravenous Every 8 hours 02/26/16 1026 02/26/16 1028   02/26/16 1030  ciprofloxacin (CIPRO) IVPB 400 mg  Status:  Discontinued     400 mg 200 mL/hr over 60 Minutes Intravenous Every 12 hours 02/26/16 1026 03/02/16 0949   02/25/16 1930  vancomycin (VANCOCIN) IVPB 1000 mg/200 mL premix  Status:  Discontinued     1,000 mg 200 mL/hr over 60 Minutes Intravenous Every 8 hours 02/25/16 1858 02/26/16 1017   02/24/16 1915  vancomycin (VANCOCIN) IVPB 1000 mg/200 mL premix  Status:  Discontinued     1,000 mg 200 mL/hr over 60 Minutes Intravenous Every 8 hours 02/24/16 1106 02/25/16 0927   02/24/16 1115  vancomycin (VANCOCIN) 1,250 mg in sodium chloride 0.9 % 250 mL IVPB     1,250 mg 166.7 mL/hr over 90 Minutes Intravenous  Once 02/24/16 1106 02/24/16 1430   02/22/16 1800  vancomycin (VANCOCIN) IVPB 750 mg/150 ml premix  Status:  Discontinued     750 mg 150 mL/hr over 60 Minutes Intravenous Every 8 hours 02/22/16 0949 02/24/16 1106   02/22/16 1430  vancomycin (VANCOCIN) 50 mg/mL oral solution 125 mg     125 mg Oral 4 times daily 02/22/16 1420 03/07/16 1359   02/22/16 1400  piperacillin-tazobactam (ZOSYN) IVPB 3.375 g  Status:  Discontinued     3.375 g 12.5 mL/hr over 240 Minutes Intravenous Every 8 hours 02/22/16 0949 02/26/16 0955   02/22/16 1030  vancomycin (VANCOCIN) 500 mg in sodium chloride 0.9 % 100 mL IVPB     500 mg 100 mL/hr over 60 Minutes Intravenous  Once 02/22/16 0949 02/22/16 1212   02/22/16 0900  azithromycin (ZITHROMAX) 500 mg in dextrose 5 % 250 mL IVPB  Status:  Discontinued     500 mg 250 mL/hr over 60 Minutes Intravenous Every 24 hours 02/22/16 0846 02/24/16 0859   02/22/16 0615  piperacillin-tazobactam (ZOSYN) IVPB 3.375 g     3.375  g 100 mL/hr over 30 Minutes Intravenous  Once 02/22/16 0601 02/22/16 0708   02/22/16 0615  vancomycin (VANCOCIN) IVPB 1000 mg/200 mL premix     1,000 mg 200 mL/hr over 60 Minutes Intravenous  Once 02/22/16 0601 02/22/16 0748      Procedures: ETT 12/21>>1/4 Trach 1/4 >> Left IJ 12/22 >> Foley >> PEG >> Rectal tube >>  CONSULTS: PCCM  Palliative Care  Time spent: 25- minutes-Greater than 50% of this time was spent in counseling, explanation of diagnosis, planning of further management, and coordination of care.  MEDICATIONS: Scheduled Meds: . chlorhexidine gluconate (MEDLINE KIT)  15 mL Mouth Rinse BID  . enoxaparin (LOVENOX) injection  40 mg Subcutaneous Q24H  . famotidine  20 mg Per Tube BID  . free water  200 mL Per Tube Q6H  . furosemide  20 mg Intravenous Daily  .  insulin aspart  2-6 Units Subcutaneous Q4H  . mouth rinse  15 mL Mouth Rinse QID  . meropenem (MERREM) IV  1 g Intravenous Q8H  . vancomycin  125 mg Oral QID  . vancomycin  750 mg Intravenous Q12H   Continuous Infusions: . feeding supplement (VITAL AF 1.2 CAL) 1,000 mL (03/15/16 2315)   PRN Meds:.sodium chloride, acetaminophen, albuterol, ondansetron (ZOFRAN) IV   PHYSICAL EXAM: Vital signs: Vitals:   03/16/16 0600 03/16/16 0700 03/16/16 0801 03/16/16 0838  BP: 94/76 97/65 103/75 103/75  Pulse: 99 92 92 92  Resp: _0 Temp:   98.6 F (37 C)   TempSrc:   Oral   SpO2: 100% 100% 100% 100%  Weight:      Height:       Filed Weights   03/14/16 0406 03/15/16 0403 03/16/16 0400  Weight: 62.2 kg (137 lb 2 oz) 63.9 kg (140 lb 14 oz) 68 kg (149 lb 14.6 oz)   Body mass index is 24.95 kg/m.   General exam: Lethargic, does not respond to voice. Not following any commands Respiratory system: Vent-trach. Course breath sounds  Cardiovascular system: S1 & S2 heard, tachycardic, regular. No JVD, murmurs, rubs, gallops or clicks.  Gastrointestinal system: Abdomen is nondistended, soft and  nontender.Peg tube in pace Central nervous system: does not respond to voice  Extremities: Symmetric, contracted at knees, b/l wrist drop Skin: No rashes, lesions or ulcers on exposed skin    I have personally reviewed following labs and imaging studies  LABORATORY DATA: CBC:  Recent Labs Lab 03/10/16 0417 03/11/16 0655 03/12/16 0429 03/13/16 0438 03/14/16 0400 03/16/16 0432  WBC 18.3* 16.3* 14.7* 14.7* 14.6* 12.5*  NEUTROABS 12.4* 10.7* 9.8* 9.2* 9.3*  --   HGB 10.4* 9.6* 9.3* 9.2* 8.8* 8.7*  HCT 33.4* 30.9* 30.2* 29.7* 28.9* 28.1*  MCV 83.3 84.7 83.9 85.1 85.0 84.4  PLT 437* PLATELET CLUMPS NOTED ON SMEAR, COUNT APPEARS ADEQUATE 429* 399 381 998    Basic Metabolic Panel:  Recent Labs Lab 03/11/16 0655 03/12/16 0429 03/13/16 0438 03/14/16 0400 03/16/16 0432  NA 143 142 143 142 138  K 3.9 3.4* 3.6 3.8 3.3*  CL 104 103 105 103 99*  CO2 _1 GLUCOSE 107* 115* 113* 114* 108*  BUN 34* 31* 27* 26* 23*  CREATININE 0.79 0.79 0.71 0.67 0.60  CALCIUM 8.9 9.0 9.1 8.9 8.8*    GFR: Estimated Creatinine Clearance: 79.1 mL/min (by C-G formula based on SCr of 0.6 mg/dL).  Liver Function Tests: No results for input(s): AST, ALT, ALKPHOS, BILITOT, PROT, ALBUMIN in the last 168 hours. No results for input(s): LIPASE, AMYLASE in the last 168 hours. No results for input(s): AMMONIA in the last 168 hours.  Coagulation Profile: No results for input(s): INR, PROTIME in the last 168 hours.  Cardiac Enzymes: No results for input(s): CKTOTAL, CKMB, CKMBINDEX, TROPONINI in the last 168 hours.  BNP (last 3 results) No results for input(s): PROBNP in the last 8760 hours.  HbA1C: No results for input(s): HGBA1C in the last 72 hours.  CBG:  Recent Labs Lab 03/15/16 1531 03/15/16 2005 03/15/16 2350 03/16/16 0401 03/16/16 0800  GLUCAP 105* 100* 111* 117* 117*    Lipid Profile: No results for input(s): CHOL, HDL, LDLCALC, TRIG, CHOLHDL, LDLDIRECT in the last  72 hours.  Thyroid Function Tests: No results for input(s): TSH, T4TOTAL, FREET4, T3FREE, THYROIDAB in the last 72 hours.  Anemia Panel: No results for input(s):  VITAMINB12, FOLATE, FERRITIN, TIBC, IRON, RETICCTPCT in the last 72 hours.  Urine analysis:    Component Value Date/Time   COLORURINE YELLOW 03/05/2016 1725   APPEARANCEUR HAZY (A) 03/05/2016 1725   LABSPEC 1.019 03/05/2016 1725   PHURINE 5.0 03/05/2016 1725   GLUCOSEU NEGATIVE 03/05/2016 1725   HGBUR SMALL (A) 03/05/2016 1725   BILIRUBINUR NEGATIVE 03/05/2016 1725   KETONESUR NEGATIVE 03/05/2016 1725   PROTEINUR 100 (A) 03/05/2016 1725   UROBILINOGEN 1.0 10/17/2013 1957   NITRITE NEGATIVE 03/05/2016 1725   LEUKOCYTESUR LARGE (A) 03/05/2016 1725    Sepsis Labs: Lactic Acid, Venous    Component Value Date/Time   LATICACIDVEN 2.9 (HH) 02/22/2016 1539    MICROBIOLOGY: Recent Results (from the past 240 hour(s))  Culture, Urine     Status: Abnormal   Collection Time: 03/06/16 11:30 AM  Result Value Ref Range Status   Specimen Description URINE, RANDOM  Final   Special Requests NONE  Final   Culture (A)  Final    >=100,000 COLONIES/mL PROTEUS MIRABILIS >=100,000 COLONIES/mL ESCHERICHIA COLI Confirmed Extended Spectrum Beta-Lactamase Producer (ESBL)    Report Status 03/09/2016 FINAL  Final   Organism ID, Bacteria PROTEUS MIRABILIS (A)  Final   Organism ID, Bacteria ESCHERICHIA COLI (A)  Final      Susceptibility   Escherichia coli - MIC*    AMPICILLIN >=32 RESISTANT Resistant     CEFAZOLIN >=64 RESISTANT Resistant     CEFTRIAXONE >=64 RESISTANT Resistant     CIPROFLOXACIN >=4 RESISTANT Resistant     GENTAMICIN <=1 SENSITIVE Sensitive     IMIPENEM <=0.25 SENSITIVE Sensitive     NITROFURANTOIN <=16 SENSITIVE Sensitive     TRIMETH/SULFA <=20 SENSITIVE Sensitive     AMPICILLIN/SULBACTAM 16 INTERMEDIATE Intermediate     PIP/TAZO <=4 SENSITIVE Sensitive     Extended ESBL POSITIVE Resistant     * >=100,000  COLONIES/mL ESCHERICHIA COLI   Proteus mirabilis - MIC*    AMPICILLIN >=32 RESISTANT Resistant     CEFAZOLIN <=4 SENSITIVE Sensitive     CEFTRIAXONE <=1 SENSITIVE Sensitive     CIPROFLOXACIN >=4 RESISTANT Resistant     GENTAMICIN <=1 SENSITIVE Sensitive     IMIPENEM 4 SENSITIVE Sensitive     NITROFURANTOIN 128 RESISTANT Resistant     TRIMETH/SULFA 160 RESISTANT Resistant     AMPICILLIN/SULBACTAM 4 SENSITIVE Sensitive     PIP/TAZO <=4 SENSITIVE Sensitive     * >=100,000 COLONIES/mL PROTEUS MIRABILIS  Culture, bal-quantitative     Status: Abnormal   Collection Time: 03/07/16  4:08 PM  Result Value Ref Range Status   Specimen Description BRONCHIAL ALVEOLAR LAVAGE  Final   Special Requests NONE  Final   Gram Stain   Final    ABUNDANT WBC PRESENT, PREDOMINANTLY PMN RARE GRAM NEGATIVE RODS RARE GRAM POSITIVE COCCI IN CLUSTERS    Culture (A)  Final    70,000 COLONIES/mL METHICILLIN RESISTANT STAPHYLOCOCCUS AUREUS 50,000 COLONIES/mL PSEUDOMONAS AERUGINOSA    Report Status 03/10/2016 FINAL  Final   Organism ID, Bacteria PSEUDOMONAS AERUGINOSA (A)  Final   Organism ID, Bacteria METHICILLIN RESISTANT STAPHYLOCOCCUS AUREUS (A)  Final      Susceptibility   Methicillin resistant staphylococcus aureus - MIC*    CIPROFLOXACIN >=8 RESISTANT Resistant     ERYTHROMYCIN >=8 RESISTANT Resistant     GENTAMICIN <=0.5 SENSITIVE Sensitive     OXACILLIN >=4 RESISTANT Resistant     TETRACYCLINE <=1 SENSITIVE Sensitive     VANCOMYCIN <=0.5 SENSITIVE Sensitive  TRIMETH/SULFA <=10 SENSITIVE Sensitive     CLINDAMYCIN >=8 RESISTANT Resistant     RIFAMPIN <=0.5 SENSITIVE Sensitive     Inducible Clindamycin NEGATIVE Sensitive     * 70,000 COLONIES/mL METHICILLIN RESISTANT STAPHYLOCOCCUS AUREUS   Pseudomonas aeruginosa - MIC*    CEFTAZIDIME 4 SENSITIVE Sensitive     CIPROFLOXACIN <=0.25 SENSITIVE Sensitive     GENTAMICIN <=1 SENSITIVE Sensitive     IMIPENEM 1 SENSITIVE Sensitive     PIP/TAZO 8  SENSITIVE Sensitive     CEFEPIME 2 SENSITIVE Sensitive     * 50,000 COLONIES/mL PSEUDOMONAS AERUGINOSA    RADIOLOGY STUDIES/RESULTS: Ct Chest W Contrast  Result Date: 02/22/2016 CLINICAL DATA:  46 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 Code sepsis. EXAM: CT CHEST WITH CONTRAST TECHNIQUE: Multidetector CT imaging of the chest was performed during intravenous contrast administration. CONTRAST:  75 cc ISOVUE-300 IOPAMIDOL (ISOVUE-300) INJECTION 61% COMPARISON:  Chest radiograph from earlier today. FINDINGS: Cardiovascular: Normal heart size. No significant pericardial fluid/thickening. Great vessels are normal in course and caliber. No central pulmonary emboli. Mediastinum/Nodes: No discrete thyroid nodules. Unremarkable esophagus. No pathologically enlarged axillary, mediastinal or hilar lymph nodes. Lungs/Pleura: No pneumothorax. No pleural effusion. Endotracheal tube tip is 2.8 cm above the carina. There is complete consolidation and severe volume loss in the right upper lobe. The right upper lobe bronchus is occluded with low attenuation material favoring mucous plugging. No discrete obstructing mass. There is moderate patchy consolidation and ground-glass attenuation throughout the right middle lobe, lingula and bilateral lower lobes (right greater than left), with associated mild volume loss in the lower lobes. No significant pulmonary nodules in the limited aerated portions of the lungs. Upper abdomen: Enteric tube terminates in the gastric fundus. Musculoskeletal:  No aggressive appearing focal osseous lesions. IMPRESSION: 1. Well-positioned endotracheal and enteric tubes. 2. Complete right upper lobe consolidation and significant volume loss with occlusion of the right upper lobe bronchus by low-attenuation material, favoring mucous plugging. No discrete obstructing mass. 3. Moderate patchy consolidation and ground-glass  attenuation throughout the right middle lobe, lingula and bilateral lower lobes, favoring multilobar pneumonia and/or aspiration. Electronically Signed   By: Ilona Sorrel M.D.   On: 02/22/2016 10:41   Ir Fluoro Guide Cv Line Left  Result Date: 02/23/2016 INDICATION: 46 year old female with multi lobar pneumonia. EXAM: IR LEFT FLOURO GUIDE CV LINE; IR ULTRASOUND GUIDANCE VASC ACCESS LEFT MEDICATIONS: None ANESTHESIA/SEDATION: None FLUOROSCOPY TIME:  Fluoroscopy Time: 0 minutes 6 seconds (1 mGy). COMPLICATIONS: None PROCEDURE: Informed written consent was obtained from the patient's family after a thorough discussion of the procedural risks, benefits and alternatives. All questions were addressed. Maximal Sterile Barrier Technique was utilized including caps, mask, sterile gowns, sterile gloves, sterile drape, hand hygiene and skin antiseptic. A timeout was performed prior to the initiation of the procedure. Ultrasound survey of the left jugular region was performed with images stored and sent to PACs. The patient is prepped and draped in the usual sterile fashion. Skin and subcutaneous tissues were generously infiltrated 1% lidocaine for local anesthesia. Using ultrasound guidance, micropuncture set was used access the left internal jugular vein. Micro wire was passed through the needle into the right heart. Small incision was made in the needle was removed. Peel-away sheath was then placed, and a length of 19 cm was estimated. Triple-lumen 5 French catheter was then ligated at 19 cm and passed through the peel-away sheath. Peel-away sheath was removed and the catheter was sutured  in position and flushed. Final image was stored. Patient tolerated the procedure well and remained hemodynamically stable throughout. No complications were encountered and no significant blood loss. IMPRESSION: Status post left IJ central catheter placement. Catheter ready for use. Signed, Dulcy Fanny. Earleen Newport, DO [facility] Radiology Electronically Signed   By: Corrie Mckusick D.O.   On: 02/23/2016 17:03   Ir US Guide Vasc Access Left  Result Date: 02/23/2016 INDICATION: 46 year old female with multi lobar pneumonia. EXAM: IR LEFT FLOURO GUIDE CV LINE; IR ULTRASOUND GUIDANCE VASC ACCESS LEFT MEDICATIONS: None ANESTHESIA/SEDATION: None FLUOROSCOPY TIME:  Fluoroscopy Time: 0 minutes 6 seconds (1 mGy). COMPLICATIONS: None PROCEDURE: Informed written consent was obtained from the patient's family after a thorough discussion of the procedural risks, benefits and alternatives. All questions were addressed. Maximal Sterile Barrier Technique was utilized including caps, mask, sterile gowns, sterile gloves, sterile drape, hand hygiene and skin antiseptic. A timeout was performed prior to the initiation of the procedure. Ultrasound survey of the left jugular region was performed with images stored and sent to PACs. The patient is prepped and draped in the usual sterile fashion. Skin and subcutaneous tissues were generously infiltrated 1% lidocaine for local anesthesia. Using ultrasound guidance, micropuncture set was used access the left internal jugular vein. Micro wire was passed through the needle into the right heart. Small incision was made in the needle was removed. Peel-away sheath was then placed, and a length of 19 cm was estimated. Triple-lumen 5 French catheter was then ligated at 19 cm and passed through the peel-away sheath. Peel-away sheath was removed and the catheter was sutured in position and flushed. Final image was stored. Patient tolerated the procedure well and remained hemodynamically stable throughout. No complications were encountered and no significant blood loss. IMPRESSION: Status post left IJ central catheter placement. Catheter ready for use. Signed, Dulcy Fanny. Earleen Newport, DO [facility] Radiology Electronically Signed    By: Corrie Mckusick D.O.   On: 02/23/2016 17:03   Dg Chest Port 1 View  Result Date: 03/12/2016 CLINICAL DATA:  Acute respiratory failure with hypoxia. Tracheostomy patient. EXAM: PORTABLE CHEST 1 VIEW COMPARISON:  Portable chest x-ray of June 05, 2016 and March 04, 2016. FINDINGS: The lungs remain mildly hypoinflated. Patchy density at the left lung base persists but there has been marked improvement since a study of March 04, 2016. The left hemidiaphragm is mildly elevated. The heart and pulmonary vascularity are normal. The tracheostomy appliance tip projects at the superior margin of the clavicular heads. The left internal jugular venous catheter tip projects over the midportion of the SVC. The bony thorax exhibits no acute abnormality. IMPRESSION: Further interval improvement in the appearance of the left lower lobe pneumonia. Electronically Signed   By: David  Martinique M.D.   On: 03/12/2016 12:46   Dg Chest Port 1 View  Result Date: 03/07/2016 CLINICAL DATA:  Respiratory failure. EXAM: PORTABLE CHEST 1 VIEW COMPARISON:  03/04/2016. FINDINGS: Interim placement of tracheostomy tube. Left IJ line in stable position. Heart size stable. Low lung volumes with mild bibasilar infiltrates. Small left pleural effusion cannot be excluded. Elevation left hemidiaphragm again noted. IMPRESSION: 1. Tracheostomy tube in good anatomic position. Left IJ line in good anatomic position. 2. Low lung volumes. Persistent left base infiltrate. No change from prior exam. Electronically Signed   By: Marcello Moores  Register   On: 03/07/2016 16:09   Dg Chest Port 1 View  Result Date: 03/07/2016 CLINICAL DATA:  Intubated patient, acute respiratory failure, pneumonia EXAM: PORTABLE CHEST 1 VIEW COMPARISON:  Portable chest x-ray of March 04, 2016 FINDINGS: The lungs are borderline hypoinflated. The retrocardiac region on the left remains dense with obscuration of the mildly elevated hemidiaphragm. The heart is not enlarged. The pulmonary  vascularity is not engorged. The endotracheal tube tip lies 5.6 cm above the carina. The left internal jugular venous catheter tip projects over the midportion of the SVC. IMPRESSION: Persistent left lower lobe atelectasis or pneumonia somewhat improved since the previous study. Probable small left pleural effusion. The support devices are in stable position. Electronically Signed   By: David  Martinique M.D.   On: 03/07/2016 06:49   Dg Chest Port 1 View  Result Date: 03/04/2016 CLINICAL DATA:  Acute respiratory failure. EXAM: PORTABLE CHEST 1 VIEW COMPARISON:  03/01/2016 FINDINGS: Endotracheal tube is in place with tip 4.1 cm above the carina. Left IJ central line tip to the superior vena cava. There is elevation of left hemidiaphragm. There is increasing opacity within the left lower lobe. No pulmonary edema. Right lung remains clear. IMPRESSION: Increasing left lower lobe infiltrate. Stable elevation of left hemidiaphragm. Electronically Signed   By: Nolon Nations M.D.   On: 03/04/2016 07:25   Dg Chest Port 1 View  Result Date: 03/01/2016 CLINICAL DATA:  Hypoxia EXAM: PORTABLE CHEST 1 VIEW COMPARISON:  February 29, 2016 FINDINGS: Endotracheal tube tip is 3.2 cm above the carina. Central catheter tip is at the cavoatrial junction. No pneumothorax. There is patchy consolidation in the left base with small left pleural effusion. Right lung is clear. Heart is upper normal in size with pulmonary vascularity within normal limits. No adenopathy. No bone lesions. IMPRESSION: Tube and catheter positions as described without pneumothorax. Consolidation left base with small left pleural effusion. Right lung clear. Electronically Signed   By: Lowella Grip III M.D.   On: 03/01/2016 07:50   Dg Chest Port 1 View  Result Date: 02/29/2016 CLINICAL DATA:  Hypoxia EXAM: PORTABLE CHEST 1 VIEW COMPARISON:  February 28, 2016 FINDINGS: Endotracheal tube tip is 2.3 cm above the carina. Central catheter tip is at the  cavoatrial junction. No pneumothorax. There is mild bibasilar atelectasis. Lungs elsewhere clear. Heart size and pulmonary vascularity are normal. No adenopathy. No bone lesions. IMPRESSION: Tube and catheter positions as described without pneumothorax. Bibasilar atelectasis. No frank edema or consolidation. Stable cardiac silhouette. Electronically Signed   By: Lowella Grip III M.D.   On: 02/29/2016 07:07   Dg Chest Port 1 View  Result Date: 02/28/2016 CLINICAL DATA:  Respiratory failure.  Shortness of breath. EXAM: PORTABLE CHEST 1 VIEW COMPARISON:  02/27/2016 . FINDINGS: Endotracheal tube in stable position. Low lung volumes with mild bibasilar atelectasis. Interim improvement from prior exam. Left base atelectasis/ infiltrate has cleared. Interim near complete resolution of small bilateral pleural effusions. No pneumothorax . IMPRESSION: 1. Lines and tubes in stable position. 2. Low lung volumes with mild bibasilar subsegmental atelectasis. Interim significant improvement from prior exam. Interim near complete resolution of small bilateral pleural effusions . Electronically Signed   By: Marcello Moores  Register   On: 02/28/2016 06:54   Dg Chest Port 1 View  Result Date: 02/27/2016 CLINICAL DATA:  Respiratory failure. EXAM: PORTABLE CHEST 1 VIEW COMPARISON:  02/26/2016. FINDINGS: Endotracheal tube, left IJ line in stable position. Heart size normal. Low lung volumes with basilar atelectasis. Developing left base infiltrate. Tiny pleural effusions cannot be excluded. No pneumothorax . IMPRESSION: 1. Lines and tubes in stable position.  2. Low lung volumes with basilar atelectasis. Developing left lower lobe infiltrate. Small bilateral pleural effusions cannot be excluded. Electronically Signed   By: Marcello Moores  Register   On: 02/27/2016 07:13   Dg Chest Port 1 View  Result Date: 02/26/2016 CLINICAL DATA:  Acute respiratory failure. EXAM: PORTABLE CHEST 1 VIEW COMPARISON:  02/25/2016 FINDINGS: Endotracheal  tube has tip 3.6 cm above the carina. Left IJ central venous catheters unchanged with tip over the right atrium. Lungs are hypoinflated with linear atelectasis over the right midlung. Mild patchy opacification over the medial right upper lobe. Mild stable cardiomegaly. Remainder of the exam is unchanged. IMPRESSION: Mild opacification over the medial right upper lobe as cannot exclude residual infection. Minimal linear atelectasis right midlung. Tubes and lines as described. Electronically Signed   By: Marin Olp M.D.   On: 02/26/2016 07:28   Dg Chest Port 1 View  Result Date: 02/25/2016 CLINICAL DATA:  Acute respiratory failure.  Endotracheal tube. EXAM: PORTABLE CHEST 1 VIEW COMPARISON:  02/24/2016 FINDINGS: Endotracheal tube and left IJ central venous catheter are unchanged. Lungs are hypoinflated with minimal right base opacification unchanged likely atelectasis. Subtle prominence of the perihilar markings. Cardiomediastinal silhouette and remainder of the exam is unchanged. IMPRESSION: Stable mild right base opacification likely atelectasis. Suggestion of minimal vascular congestion. Tubes and lines unchanged. Electronically Signed   By: Marin Olp M.D.   On: 02/25/2016 08:01   Dg Chest Port 1 View  Result Date: 02/24/2016 CLINICAL DATA:  Acute respiratory failure. EXAM: PORTABLE CHEST 1 VIEW COMPARISON:  02/23/2016 and chest CT 02/22/2016 FINDINGS: Endotracheal tube unchanged. Interval placement of left IJ central venous catheter which has tip at the cavoatrial junction. Patient slightly rotated to the right. Lungs are hypoinflated with mild stable bibasilar opacification which may be due to atelectasis or infection. Increased density in the right hilar region slightly worse. No pneumothorax. Cardiomediastinal silhouette and remainder of the exam is unchanged. IMPRESSION: Persistent bibasilar opacification with slight worsening right hilar opacification suggesting atelectasis or infection.  Tubes and lines as described. Electronically Signed   By: Marin Olp M.D.   On: 02/24/2016 08:44   Dg Chest Port 1 View  Result Date: 02/23/2016 CLINICAL DATA:  Initial evaluation for acute respiratory failure. EXAM: PORTABLE CHEST 1 VIEW COMPARISON:  Prior radiograph from 02/22/2016. FINDINGS: Patient remains intubated with the tip of an endotracheal tube positioned 2.9 cm above the carina. Cardiac and mediastinal silhouettes are stable, and remain within normal limits. Lungs are hypoinflated. Patchy bibasilar opacities, right greater than left, again suspicious for possible multi lobar pneumonia. Overall, these are slightly more dense as compared to the prior exam. Underlying pulmonary interstitial edema is improved. Asymmetric prominence of the pulmonary vasculature within the right lung favored to be related to patient rotation on this exam. No pneumothorax. No acute osseous abnormality. IMPRESSION: 1. Tip of the endotracheal tube 2.9 cm above the carina. 2. Shallow lung inflation with persistent right greater than left bibasilar opacities, compatible with multi lobar pneumonia. Overall, these are slightly more dense in appearance as compared to previous exam. 3. Improved/resolved underlying pulmonary interstitial edema. Electronically Signed   By: Jeannine Boga M.D.   On: 02/23/2016 06:30   Dg Chest Port 1 View  Result Date: 02/22/2016 CLINICAL DATA:  Respiratory failure. EXAM: PORTABLE CHEST 1 VIEW COMPARISON:  CT chest 02/22/2016 FINDINGS: Endotracheal tube with the tip 4.5 cm above the carina. Patchy areas of airspace disease in the right upper and lower lobes. Patchy airspace disease  in the left lower lobe. No pleural effusion or pneumothorax. Stable cardiomediastinal silhouette. IMPRESSION: 1. Endotracheal tube with the tip 4.5 cm above the carina. 2. Airspace disease in the right upper, right lower and left lower lobes most consistent with multi lobar pneumonia. Electronically Signed    By: Kathreen Devoid   On: 02/22/2016 16:03   Dg Chest Portable 1 View  Result Date: 02/22/2016 CLINICAL DATA:  46 year old female status post ET tube placement. Patient's septic. EXAM: PORTABLE CHEST 1 VIEW COMPARISON:  Chest radiograph dated 07/28/2015 FINDINGS: An endotracheal tube is seen with tip approximately 3 cm above the carina. An enteric tube extends into the left upper abdomen with tip and side-port likely in the proximal stomach. There is near complete consolidative changes of the right upper lobe with collapse. There is near complete consolidative changes of the right middle lobe. The lucent area within the right hemithorax likely represents and aerated portion of the lung. A pneumothorax is less likely as there appears to be lung markings, but not entirely excluded. Further evaluation with CT is recommended. The left lung is clear.  The osseous structures are intact. IMPRESSION: Endotracheal tube above the carina. Large consolidative areas primarily in the right upper lobe and right middle lobe. A centrally obstructing mass or endobronchial lesion is not excluded. CT is recommended for further evaluation. Probable aerated portion of the right lower lobe versus less likely a pneumothorax. These results were called by telephone at the time of interpretation on 02/22/2016 at 6:45 am to Dr. Pryor Curia , who verbally acknowledged these results. Electronically Signed   By: Anner Crete M.D.   On: 02/22/2016 06:47     LOS: 60 days   Oren Binet, MD  [facility] Pager:[phone]  If 7PM-7AM, please contact night-coverage [email or web address] Password [email or web address] 03/16/2016, 11:03 AM

## 2016-03-17 LAB — CBC
HEMATOCRIT: 29.4 % — AB (ref 36.0–46.0)
HEMOGLOBIN: 9.3 g/dL — AB (ref 12.0–15.0)
MCH: 26.7 pg (ref 26.0–34.0)
MCHC: 31.6 g/dL (ref 30.0–36.0)
MCV: 84.5 fL (ref 78.0–100.0)
Platelets: 338 10*3/uL (ref 150–400)
RBC: 3.48 MIL/uL — AB (ref 3.87–5.11)
RDW: 17.2 % — ABNORMAL HIGH (ref 11.5–15.5)
WBC: 13.9 10*3/uL — ABNORMAL HIGH (ref 4.0–10.5)

## 2016-03-17 LAB — COMPREHENSIVE METABOLIC PANEL
ALK PHOS: 79 U/L (ref 38–126)
ALT: 44 U/L (ref 14–54)
ANION GAP: 8 (ref 5–15)
AST: 33 U/L (ref 15–41)
Albumin: 2.6 g/dL — ABNORMAL LOW (ref 3.5–5.0)
BUN: 20 mg/dL (ref 6–20)
CALCIUM: 8.8 mg/dL — AB (ref 8.9–10.3)
CO2: 31 mmol/L (ref 22–32)
CREATININE: 0.67 mg/dL (ref 0.44–1.00)
Chloride: 99 mmol/L — ABNORMAL LOW (ref 101–111)
GFR calc non Af Amer: >60 mL/min (ref >60)
GLUCOSE: 107 mg/dL — AB (ref 65–99)
Potassium: 3.4 mmol/L — ABNORMAL LOW (ref 3.5–5.1)
Sodium: 138 mmol/L (ref 135–145)
TOTAL PROTEIN: 6.7 g/dL (ref 6.5–8.1)
Total Bilirubin: 0.5 mg/dL (ref 0.3–1.2)

## 2016-03-17 LAB — VANCOMYCIN, TROUGH: Vancomycin Tr: 23 ug/mL (ref 15–20)

## 2016-03-17 LAB — GLUCOSE, CAPILLARY
GLUCOSE-CAPILLARY: 110 mg/dL — AB (ref 65–99)
GLUCOSE-CAPILLARY: 96 mg/dL (ref 65–99)
Glucose-Capillary: 104 mg/dL — ABNORMAL HIGH (ref 65–99)
Glucose-Capillary: 112 mg/dL — ABNORMAL HIGH (ref 65–99)
Glucose-Capillary: 70 mg/dL (ref 65–99)
Glucose-Capillary: 71 mg/dL (ref 65–99)

## 2016-03-17 MED ORDER — DEXTROSE 50 % IV SOLN
INTRAVENOUS | Status: AC
  Administered 2016-03-17: 25 mL via INTRAVASCULAR
  Filled 2016-03-17: qty 50

## 2016-03-17 MED ORDER — VANCOMYCIN HCL 500 MG IV SOLR
500.0000 mg | Freq: Two times a day (BID) | INTRAVENOUS | Status: DC
  Filled 2016-03-17: qty 500

## 2016-03-17 MED ORDER — VANCOMYCIN HCL 10 G IV SOLR
1250.0000 mg | INTRAVENOUS | Status: AC
  Administered 2016-03-17 – 2016-03-23 (×7): 1250 mg via INTRAVENOUS
  Filled 2016-03-17 (×7): qty 1250

## 2016-03-17 MED ORDER — POTASSIUM CHLORIDE 20 MEQ/15ML (10%) PO SOLN
40.0000 meq | Freq: Once | ORAL | Status: AC
  Administered 2016-03-17: 40 meq
  Filled 2016-03-17: qty 30

## 2016-03-17 NOTE — Progress Notes (Signed)
Pharmacy Antibiotic Note  [name] [name] is a 46 y.o. female continues on vancomycin and meropenem for MRSA and pseudomonal PNA and ESBL E coli and Proteus mirabilis UTI. Vancomycin trough drawn today is supratherapeutic at 23 on 75m IV q12. Renal function has been stable.   Plan:  1) Change vancomycin to 12545mIV q24, check another trough at new steady state 2) Continue meropenem 1g IV q8  Height: _0  (165.1 cm) Weight: 130 lb 4.7 oz (59.1 kg) IBW/kg (Calculated) : 57  Temp (24hrs), Avg:98.5 F (36.9 C), Min:97.8 F (36.6 C), Max:98.8 F (37.1 C)   Recent Labs Lab 03/12/16 0429 03/13/16 0438 03/14/16 0400 03/16/16 0432 03/17/16 0316 03/17/16 1530  WBC 14.7* 14.7* 14.6* 12.5* 13.9*  --   CREATININE 0.79 0.71 0.67 0.60 0.67  --   VANCOTROUGH  --   --  14*  --   --  23*    Estimated Creatinine Clearance: 79.1 mL/min (by C-G formula based on SCr of 0.67 mg/dL).    Allergies  Allergen Reactions  . Sulfa Antibiotics Other (See Comments)    Per MAR    Antimicrobials this admission: Vancomycin 12/21>>12/24; 1/6>> Zosyn 12/21>>12/24 Azithromycin 12/21 >> 12/23 PO Vanc 12/21 >>  Cipro 12/25 >>12/30 Ceftriaxone 1/3 >> 1/5 Ceftazidime 1/5 >>1/6 Meropenem 1/6>>  Dose adjustments this admission: 1/11 VT = 14 on 50014m12, inc to 750m88m2 1/14 VT = 23 on 750mg38m, change to 1250mg 55m Microbiology results: 12/21BCx: 1/2 GPC clusters (BCID- CONS) 12/21UCx: multiple species, none predominant 12/21 BAL: >100K Pseudomonas; 20K MRSA 12/21 C. Diff Ag positive, toxin negative, PCR positive 12/21 Legionella UAg: ip 12/21 Strep UAg: negative 12/21 RVP: negative 12/21 GI Panel: + Norovirus 12/21 MRSA PCR negative 12/21 Influenza PCR negative 1/3 urine cx: Proteus, E.coli (ESBL) 1/2 blood cx: ngtd 1/4 BAL: 70K staph aureus, 50k pseudomonas  Thank you for asking pharmacy to be involved in the care of this patient.   JennifNena JordanmD, BCPS  03/17/2016  4:59 PM

## 2016-03-17 NOTE — Progress Notes (Signed)
No labs ordered for this am.  Notified Md if want any labs for this am.  Potassium 1/13 = 3.3 hgb 8.7.  Will continue to monitor [name] AddisonCoro, [name]

## 2016-03-17 NOTE — Progress Notes (Signed)
Md notified pt's cbg = 70 gave 1/2 amp d50. Will recheck and continue to monitor. Karena Addisonoro, Kenn Rekowski T

## 2016-03-17 NOTE — Progress Notes (Signed)
PROGRESS NOTE        PATIENT DETAILS Name: [name] [name] Age: 46 y.o. Sex: female Date of Birth: [birth year] Admit Date: 02/22/2016 Admitting Physician Marshell Garfinkel, MD HYW:VPXT, SAAD, MD  Brief Narrative: Patient is a 46 y.o. female with past medical history of frontotemporal dementia of an resident of a skilled nursing facility-apparently contracted and aphasic at baseline-brought to the hospital on 02/22/16 with acute respiratory failure from multilobar pneumonia. She was intubated on admission, and was difficult to wean and subsequently required a tracheostomy on 1/4. She is still ventilator dependent. Hospital course was complicated by development of multiple electrolyte abnormalities, and possible C. difficile colitis. See below for further details  Subjective: Lying comfortably in bed-remains unchanged-not responding-aphasic. Not following commands. Spoke with RN-no major issues overnight.  Assessment/Plan: Acute hypoxemic respiratory failure: Secondary to pneumonia.Intubated and unable to wean, tracheostomy placed 1/. [facility] following for vent/trach care. Spoke with social work-needs vent-SNF at discharge, social work looking for numerous facilities, but as of now has a bed at [facility] on 03/21/2016  Septic shock secondary to multilobar pneumonia, C Diff colitis, ESBL UTI: Sepsis pathophysiology has resolved. Mild leukocytosis persists-but patient is afebrile. Her blood cultures on 1/2 are negative, BAL culture on 1/4 positive for Pseudomonas and MRSA, urine culture positive for ESBL Escherichia coli and Proteus. Remains on empiric vancomycin and meropenem with stop date of 1/20. Furthermore, hospital course was complicated by diarrhea-thought to be due to a combination of normal virus and C. difficile colitis and remains on vancomycin (stop date 1/27-with treatment extended for 1 week following completion of tx for MRSA/Pseudomonas) through the PEG  tube.Per RN stools getting more formed.  Anemia: Likely secondary to acute illness, follow.  Chronic dysphagia: Has a PEG tube in place.  History of frontotemporal dementia: Reviewed H&P-contracted, not verbal and bedbound at discharge. Resident of SNF.  Palliative care/goals of care: Very poor prognoses-seen by palliative care during this hospital stay, family still desires full aggressive care in spite of poor overall prognoses.  DVT Prophylaxis: Prophylactic Lovenox   Code Status: Full code   Family Communication: None at bedside  Disposition Plan: Remain inpatient-SNF on discharge-when bed available. Per SW-not a LTACH candidate  Antimicrobial agents: Anti-infectives    Start     Dose/Rate Route Frequency Ordered Stop   03/14/16 1600  vancomycin (VANCOCIN) IVPB 750 mg/150 ml premix     750 mg 150 mL/hr over 60 Minutes Intravenous Every 12 hours 03/14/16 0526 03/23/16 2359   03/10/16 1400  vancomycin (VANCOCIN) 50 mg/mL oral solution 125 mg     125 mg Oral 4 times daily 03/10/16 1230 03/30/16 2359   03/10/16 0400  vancomycin (VANCOCIN) 500 mg in sodium chloride 0.9 % 100 mL IVPB  Status:  Discontinued     500 mg 100 mL/hr over 60 Minutes Intravenous Every 12 hours 03/09/16 1524 03/14/16 0526   03/09/16 1600  meropenem (MERREM) 1 g in sodium chloride 0.9 % 100 mL IVPB     1 g 200 mL/hr over 30 Minutes Intravenous Every 8 hours 03/09/16 1524 03/23/16 2359   03/09/16 1600  vancomycin (VANCOCIN) IVPB 1000 mg/200 mL premix     1,000 mg 200 mL/hr over 60 Minutes Intravenous  Once 03/09/16 1524 03/09/16 1748   03/08/16 1100  cefTAZidime (FORTAZ) 1 g in dextrose 5 % 50 mL IVPB  Status:  Discontinued     1 g 100 mL/hr over 30 Minutes Intravenous Every 12 hours 03/08/16 0945 03/09/16 1511   03/07/16 1400  vancomycin (VANCOCIN) 50 mg/mL oral solution 125 mg  Status:  Discontinued     125 mg Oral 4 times daily 03/07/16 1043 03/09/16 1518   03/06/16 1300  cefTRIAXone (ROCEPHIN) 1 g  in dextrose 5 % 50 mL IVPB  Status:  Discontinued     1 g 100 mL/hr over 30 Minutes Intravenous Every 24 hours 03/06/16 1158 03/08/16 0945   02/26/16 1700  vancomycin (VANCOCIN) IVPB 1000 mg/200 mL premix  Status:  Discontinued     1,000 mg 200 mL/hr over 60 Minutes Intravenous Every 8 hours 02/26/16 1026 02/26/16 1028   02/26/16 1030  ciprofloxacin (CIPRO) IVPB 400 mg  Status:  Discontinued     400 mg 200 mL/hr over 60 Minutes Intravenous Every 12 hours 02/26/16 1026 03/02/16 0949   02/25/16 1930  vancomycin (VANCOCIN) IVPB 1000 mg/200 mL premix  Status:  Discontinued     1,000 mg 200 mL/hr over 60 Minutes Intravenous Every 8 hours 02/25/16 1858 02/26/16 1017   02/24/16 1915  vancomycin (VANCOCIN) IVPB 1000 mg/200 mL premix  Status:  Discontinued     1,000 mg 200 mL/hr over 60 Minutes Intravenous Every 8 hours 02/24/16 1106 02/25/16 0927   02/24/16 1115  vancomycin (VANCOCIN) 1,250 mg in sodium chloride 0.9 % 250 mL IVPB     1,250 mg 166.7 mL/hr over 90 Minutes Intravenous  Once 02/24/16 1106 02/24/16 1430   02/22/16 1800  vancomycin (VANCOCIN) IVPB 750 mg/150 ml premix  Status:  Discontinued     750 mg 150 mL/hr over 60 Minutes Intravenous Every 8 hours 02/22/16 0949 02/24/16 1106   02/22/16 1430  vancomycin (VANCOCIN) 50 mg/mL oral solution 125 mg     125 mg Oral 4 times daily 02/22/16 1420 03/07/16 1359   02/22/16 1400  piperacillin-tazobactam (ZOSYN) IVPB 3.375 g  Status:  Discontinued     3.375 g 12.5 mL/hr over 240 Minutes Intravenous Every 8 hours 02/22/16 0949 02/26/16 0955   02/22/16 1030  vancomycin (VANCOCIN) 500 mg in sodium chloride 0.9 % 100 mL IVPB     500 mg 100 mL/hr over 60 Minutes Intravenous  Once 02/22/16 0949 02/22/16 1212   02/22/16 0900  azithromycin (ZITHROMAX) 500 mg in dextrose 5 % 250 mL IVPB  Status:  Discontinued     500 mg 250 mL/hr over 60 Minutes Intravenous Every 24 hours 02/22/16 0846 02/24/16 0859   02/22/16 0615  piperacillin-tazobactam (ZOSYN)  IVPB 3.375 g     3.375 g 100 mL/hr over 30 Minutes Intravenous  Once 02/22/16 0601 02/22/16 0708   02/22/16 0615  vancomycin (VANCOCIN) IVPB 1000 mg/200 mL premix     1,000 mg 200 mL/hr over 60 Minutes Intravenous  Once 02/22/16 0601 02/22/16 0748      Procedures: ETT 12/21>>1/4 Trach 1/4 >> Left IJ 12/22 >> Foley >> PEG >> Rectal tube >>  CONSULTS: PCCM  Palliative Care  Time spent: 25- minutes-Greater than 50% of this time was spent in counseling, explanation of diagnosis, planning of further management, and coordination of care.  MEDICATIONS: Scheduled Meds: . chlorhexidine gluconate (MEDLINE KIT)  15 mL Mouth Rinse BID  . enoxaparin (LOVENOX) injection  40 mg Subcutaneous Q24H  . famotidine  20 mg Per Tube BID  . free water  200 mL Per Tube Q6H  . furosemide  20 mg Intravenous Daily  .  insulin aspart  2-6 Units Subcutaneous Q4H  . mouth rinse  15 mL Mouth Rinse QID  . meropenem (MERREM) IV  1 g Intravenous Q8H  . vancomycin  125 mg Oral QID  . vancomycin  750 mg Intravenous Q12H   Continuous Infusions: . feeding supplement (VITAL AF 1.2 CAL) 1,000 mL (03/17/16 1022)   PRN Meds:.sodium chloride, acetaminophen, albuterol, ondansetron (ZOFRAN) IV   PHYSICAL EXAM: Vital signs: Vitals:   03/17/16 0801 03/17/16 0826 03/17/16 1135 03/17/16 1152  BP: (!) 83/55 (!) _0  Pulse: 93 98 97 95  Resp: _1 Temp: 98.7 F (37.1 C)  98.5 F (36.9 C)   TempSrc: Oral  Oral   SpO2: 100% 100% 100% 100%  Weight:      Height:       Filed Weights   03/15/16 0403 03/16/16 0400 03/17/16 0540  Weight: 63.9 kg (140 lb 14 oz) 68 kg (149 lb 14.6 oz) 59.1 kg (130 lb 4.7 oz)   Body mass index is 21.68 kg/m.   General exam: Lethargic, does not respond to voice. Not following any commands Respiratory system: Vent-trach. Course breath sounds  Cardiovascular system: S1 & S2 heard, tachycardic, regular. No JVD, murmurs, rubs, gallops or clicks.    Gastrointestinal system: Abdomen is nondistended, soft and nontender.Peg tube in pace Central nervous system: does not respond to voice  Extremities: Symmetric, contracted at knees, b/l wrist drop Skin: No rashes, lesions or ulcers on exposed skin    I have personally reviewed following labs and imaging studies  LABORATORY DATA: CBC:  Recent Labs Lab 03/11/16 0655 03/12/16 0429 03/13/16 0438 03/14/16 0400 03/16/16 0432 03/17/16 0316  WBC 16.3* 14.7* 14.7* 14.6* 12.5* 13.9*  NEUTROABS 10.7* 9.8* 9.2* 9.3*  --   --   HGB 9.6* 9.3* 9.2* 8.8* 8.7* 9.3*  HCT 30.9* 30.2* 29.7* 28.9* 28.1* 29.4*  MCV 84.7 83.9 85.1 85.0 84.4 84.5  PLT PLATELET CLUMPS NOTED ON SMEAR, COUNT APPEARS ADEQUATE 429* 399 381 347 983    Basic Metabolic Panel:  Recent Labs Lab 03/12/16 0429 03/13/16 0438 03/14/16 0400 03/16/16 0432 03/17/16 0316  NA 142 143 142 138 138  K 3.4* 3.6 3.8 3.3* 3.4*  CL 103 105 103 99* 99*  CO2 _2 GLUCOSE 115* 113* 114* 108* 107*  BUN 31* 27* 26* 23* 20  CREATININE 0.79 0.71 0.67 0.60 0.67  CALCIUM 9.0 9.1 8.9 8.8* 8.8*    GFR: Estimated Creatinine Clearance: 79.1 mL/min (by C-G formula based on SCr of 0.67 mg/dL).  Liver Function Tests:  Recent Labs Lab 03/17/16 0316  AST 33  ALT 44  ALKPHOS 79  BILITOT 0.5  PROT 6.7  ALBUMIN 2.6*   No results for input(s): LIPASE, AMYLASE in the last 168 hours. No results for input(s): AMMONIA in the last 168 hours.  Coagulation Profile: No results for input(s): INR, PROTIME in the last 168 hours.  Cardiac Enzymes: No results for input(s): CKTOTAL, CKMB, CKMBINDEX, TROPONINI in the last 168 hours.  BNP (last 3 results) No results for input(s): PROBNP in the last 8760 hours.  HbA1C: No results for input(s): HGBA1C in the last 72 hours.  CBG:  Recent Labs Lab 03/16/16 2038 03/16/16 2356 03/17/16 0344 03/17/16 0803 03/17/16 1136  GLUCAP 114* 101* 104* 110* 96    Lipid Profile: No  results for input(s): CHOL, HDL, LDLCALC, TRIG, CHOLHDL, LDLDIRECT in the last 72 hours.  Thyroid Function Tests: No results  for input(s): TSH, T4TOTAL, FREET4, T3FREE, THYROIDAB in the last 72 hours.  Anemia Panel: No results for input(s): VITAMINB12, FOLATE, FERRITIN, TIBC, IRON, RETICCTPCT in the last 72 hours.  Urine analysis:    Component Value Date/Time   COLORURINE YELLOW 03/05/2016 1725   APPEARANCEUR HAZY (A) 03/05/2016 1725   LABSPEC 1.019 03/05/2016 1725   PHURINE 5.0 03/05/2016 1725   GLUCOSEU NEGATIVE 03/05/2016 1725   HGBUR SMALL (A) 03/05/2016 1725   BILIRUBINUR NEGATIVE 03/05/2016 1725   KETONESUR NEGATIVE 03/05/2016 1725   PROTEINUR 100 (A) 03/05/2016 1725   UROBILINOGEN 1.0 10/17/2013 1957   NITRITE NEGATIVE 03/05/2016 1725   LEUKOCYTESUR LARGE (A) 03/05/2016 1725    Sepsis Labs: Lactic Acid, Venous    Component Value Date/Time   LATICACIDVEN 2.9 (HH) 02/22/2016 1539    MICROBIOLOGY: Recent Results (from the past 240 hour(s))  Culture, bal-quantitative     Status: Abnormal   Collection Time: 03/07/16  4:08 PM  Result Value Ref Range Status   Specimen Description BRONCHIAL ALVEOLAR LAVAGE  Final   Special Requests NONE  Final   Gram Stain   Final    ABUNDANT WBC PRESENT, PREDOMINANTLY PMN RARE GRAM NEGATIVE RODS RARE GRAM POSITIVE COCCI IN CLUSTERS    Culture (A)  Final    70,000 COLONIES/mL METHICILLIN RESISTANT STAPHYLOCOCCUS AUREUS 50,000 COLONIES/mL PSEUDOMONAS AERUGINOSA    Report Status 03/10/2016 FINAL  Final   Organism ID, Bacteria PSEUDOMONAS AERUGINOSA (A)  Final   Organism ID, Bacteria METHICILLIN RESISTANT STAPHYLOCOCCUS AUREUS (A)  Final      Susceptibility   Methicillin resistant staphylococcus aureus - MIC*    CIPROFLOXACIN >=8 RESISTANT Resistant     ERYTHROMYCIN >=8 RESISTANT Resistant     GENTAMICIN <=0.5 SENSITIVE Sensitive     OXACILLIN >=4 RESISTANT Resistant     TETRACYCLINE <=1 SENSITIVE Sensitive     VANCOMYCIN <=0.5  SENSITIVE Sensitive     TRIMETH/SULFA <=10 SENSITIVE Sensitive     CLINDAMYCIN >=8 RESISTANT Resistant     RIFAMPIN <=0.5 SENSITIVE Sensitive     Inducible Clindamycin NEGATIVE Sensitive     * 70,000 COLONIES/mL METHICILLIN RESISTANT STAPHYLOCOCCUS AUREUS   Pseudomonas aeruginosa - MIC*    CEFTAZIDIME 4 SENSITIVE Sensitive     CIPROFLOXACIN <=0.25 SENSITIVE Sensitive     GENTAMICIN <=1 SENSITIVE Sensitive     IMIPENEM 1 SENSITIVE Sensitive     PIP/TAZO 8 SENSITIVE Sensitive     CEFEPIME 2 SENSITIVE Sensitive     * 50,000 COLONIES/mL PSEUDOMONAS AERUGINOSA    RADIOLOGY STUDIES/RESULTS: Ct Chest W Contrast  Result Date: 02/22/2016 CLINICAL DATA:  46 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 Code sepsis. EXAM: CT CHEST WITH CONTRAST TECHNIQUE: Multidetector CT imaging of the chest was performed during intravenous contrast administration. CONTRAST:  75 cc ISOVUE-300 IOPAMIDOL (ISOVUE-300) INJECTION 61% COMPARISON:  Chest radiograph from earlier today. FINDINGS: Cardiovascular: Normal heart size. No significant pericardial fluid/thickening. Great vessels are normal in course and caliber. No central pulmonary emboli. Mediastinum/Nodes: No discrete thyroid nodules. Unremarkable esophagus. No pathologically enlarged axillary, mediastinal or hilar lymph nodes. Lungs/Pleura: No pneumothorax. No pleural effusion. Endotracheal tube tip is 2.8 cm above the carina. There is complete consolidation and severe volume loss in the right upper lobe. The right upper lobe bronchus is occluded with low attenuation material favoring mucous plugging. No discrete obstructing mass. There is moderate patchy consolidation and ground-glass attenuation throughout the right middle lobe, lingula and bilateral lower lobes (right greater  than left), with associated mild volume loss in the lower lobes. No significant pulmonary nodules in the limited  aerated portions of the lungs. Upper abdomen: Enteric tube terminates in the gastric fundus. Musculoskeletal:  No aggressive appearing focal osseous lesions. IMPRESSION: 1. Well-positioned endotracheal and enteric tubes. 2. Complete right upper lobe consolidation and significant volume loss with occlusion of the right upper lobe bronchus by low-attenuation material, favoring mucous plugging. No discrete obstructing mass. 3. Moderate patchy consolidation and ground-glass attenuation throughout the right middle lobe, lingula and bilateral lower lobes, favoring multilobar pneumonia and/or aspiration. Electronically Signed   By: Ilona Sorrel M.D.   On: 02/22/2016 10:41   Ir Fluoro Guide Cv Line Left  Result Date: 02/23/2016 INDICATION: 46 year old female with multi lobar pneumonia. EXAM: IR LEFT FLOURO GUIDE CV LINE; IR ULTRASOUND GUIDANCE VASC ACCESS LEFT MEDICATIONS: None ANESTHESIA/SEDATION: None FLUOROSCOPY TIME:  Fluoroscopy Time: 0 minutes 6 seconds (1 mGy). COMPLICATIONS: None PROCEDURE: Informed written consent was obtained from the patient's family after a thorough discussion of the procedural risks, benefits and alternatives. All questions were addressed. Maximal Sterile Barrier Technique was utilized including caps, mask, sterile gowns, sterile gloves, sterile drape, hand hygiene and skin antiseptic. A timeout was performed prior to the initiation of the procedure. Ultrasound survey of the left jugular region was performed with images stored and sent to PACs. The patient is prepped and draped in the usual sterile fashion. Skin and subcutaneous tissues were generously infiltrated 1% lidocaine for local anesthesia. Using ultrasound guidance, micropuncture set was used access the left internal jugular vein. Micro wire was passed through the needle into the right heart. Small incision was made in the needle was removed. Peel-away sheath was then placed, and a length of 19 cm was estimated. Triple-lumen 5  French catheter was then ligated at 19 cm and passed through the peel-away sheath. Peel-away sheath was removed and the catheter was sutured in position and flushed. Final image was stored. Patient tolerated the procedure well and remained hemodynamically stable throughout. No complications were encountered and no significant blood loss. IMPRESSION: Status post left IJ central catheter placement. Catheter ready for use. Signed, Dulcy Fanny. Earleen Newport, DO [facility] Radiology Electronically Signed   By: Corrie Mckusick D.O.   On: 02/23/2016 17:03   Ir US Guide Vasc Access Left  Result Date: 02/23/2016 INDICATION: 46 year old female with multi lobar pneumonia. EXAM: IR LEFT FLOURO GUIDE CV LINE; IR ULTRASOUND GUIDANCE VASC ACCESS LEFT MEDICATIONS: None ANESTHESIA/SEDATION: None FLUOROSCOPY TIME:  Fluoroscopy Time: 0 minutes 6 seconds (1 mGy). COMPLICATIONS: None PROCEDURE: Informed written consent was obtained from the patient's family after a thorough discussion of the procedural risks, benefits and alternatives. All questions were addressed. Maximal Sterile Barrier Technique was utilized including caps, mask, sterile gowns, sterile gloves, sterile drape, hand hygiene and skin antiseptic. A timeout was performed prior to the initiation of the procedure. Ultrasound survey of the left jugular region was performed with images stored and sent to PACs. The patient is prepped and draped in the usual sterile fashion. Skin and subcutaneous tissues were generously infiltrated 1% lidocaine for local anesthesia. Using ultrasound guidance, micropuncture set was used access the left internal jugular vein. Micro wire was passed through the needle into the right heart. Small incision was made in the needle was removed. Peel-away sheath was then placed, and a length of 19 cm was estimated. Triple-lumen 5 French catheter was then ligated at 19 cm and passed through the peel-away sheath.  Peel-away sheath was removed and the catheter was sutured in position and flushed. Final image was stored. Patient tolerated the procedure well and remained hemodynamically stable throughout. No complications were encountered and no significant blood loss. IMPRESSION: Status post left IJ central catheter placement. Catheter ready for use. Signed, Dulcy Fanny. Earleen Newport, DO [facility] Radiology Electronically Signed   By: Corrie Mckusick D.O.   On: 02/23/2016 17:03   Dg Chest Port 1 View  Result Date: 03/12/2016 CLINICAL DATA:  Acute respiratory failure with hypoxia. Tracheostomy patient. EXAM: PORTABLE CHEST 1 VIEW COMPARISON:  Portable chest x-ray of June 05, 2016 and March 04, 2016. FINDINGS: The lungs remain mildly hypoinflated. Patchy density at the left lung base persists but there has been marked improvement since a study of March 04, 2016. The left hemidiaphragm is mildly elevated. The heart and pulmonary vascularity are normal. The tracheostomy appliance tip projects at the superior margin of the clavicular heads. The left internal jugular venous catheter tip projects over the midportion of the SVC. The bony thorax exhibits no acute abnormality. IMPRESSION: Further interval improvement in the appearance of the left lower lobe pneumonia. Electronically Signed   By: David  Martinique M.D.   On: 03/12/2016 12:46   Dg Chest Port 1 View  Result Date: 03/07/2016 CLINICAL DATA:  Respiratory failure. EXAM: PORTABLE CHEST 1 VIEW COMPARISON:  03/04/2016. FINDINGS: Interim placement of tracheostomy tube. Left IJ line in stable position. Heart size stable. Low lung volumes with mild bibasilar infiltrates. Small left pleural effusion cannot be excluded. Elevation left hemidiaphragm again noted. IMPRESSION: 1. Tracheostomy tube in good anatomic position. Left IJ line in good anatomic position. 2. Low lung volumes. Persistent left base infiltrate. No change from prior exam.  Electronically Signed   By: Marcello Moores  Register   On: 03/07/2016 16:09   Dg Chest Port 1 View  Result Date: 03/07/2016 CLINICAL DATA:  Intubated patient, acute respiratory failure, pneumonia EXAM: PORTABLE CHEST 1 VIEW COMPARISON:  Portable chest x-ray of March 04, 2016 FINDINGS: The lungs are borderline hypoinflated. The retrocardiac region on the left remains dense with obscuration of the mildly elevated hemidiaphragm. The heart is not enlarged. The pulmonary vascularity is not engorged. The endotracheal tube tip lies 5.6 cm above the carina. The left internal jugular venous catheter tip projects over the midportion of the SVC. IMPRESSION: Persistent left lower lobe atelectasis or pneumonia somewhat improved since the previous study. Probable small left pleural effusion. The support devices are in stable position. Electronically Signed   By: David  Martinique M.D.   On: 03/07/2016 06:49   Dg Chest Port 1 View  Result Date: 03/04/2016 CLINICAL DATA:  Acute respiratory failure. EXAM: PORTABLE CHEST 1 VIEW COMPARISON:  03/01/2016 FINDINGS: Endotracheal tube is in place with tip 4.1 cm above the carina. Left IJ central line tip to the superior vena cava. There is elevation of left hemidiaphragm. There is increasing opacity within the left lower lobe. No pulmonary edema. Right lung remains clear. IMPRESSION: Increasing left lower lobe infiltrate. Stable elevation of left hemidiaphragm. Electronically Signed   By: Nolon Nations M.D.   On: 03/04/2016 07:25   Dg Chest Port 1 View  Result Date: 03/01/2016 CLINICAL DATA:  Hypoxia EXAM: PORTABLE CHEST 1 VIEW COMPARISON:  February 29, 2016 FINDINGS: Endotracheal tube tip is 3.2 cm above the carina. Central catheter tip is at the cavoatrial junction. No pneumothorax. There is patchy consolidation in the left base with small left pleural effusion. Right lung is  clear. Heart is upper normal in size with pulmonary vascularity within normal limits. No adenopathy. No bone  lesions. IMPRESSION: Tube and catheter positions as described without pneumothorax. Consolidation left base with small left pleural effusion. Right lung clear. Electronically Signed   By: Lowella Grip III M.D.   On: 03/01/2016 07:50   Dg Chest Port 1 View  Result Date: 02/29/2016 CLINICAL DATA:  Hypoxia EXAM: PORTABLE CHEST 1 VIEW COMPARISON:  February 28, 2016 FINDINGS: Endotracheal tube tip is 2.3 cm above the carina. Central catheter tip is at the cavoatrial junction. No pneumothorax. There is mild bibasilar atelectasis. Lungs elsewhere clear. Heart size and pulmonary vascularity are normal. No adenopathy. No bone lesions. IMPRESSION: Tube and catheter positions as described without pneumothorax. Bibasilar atelectasis. No frank edema or consolidation. Stable cardiac silhouette. Electronically Signed   By: Lowella Grip III M.D.   On: 02/29/2016 07:07   Dg Chest Port 1 View  Result Date: 02/28/2016 CLINICAL DATA:  Respiratory failure.  Shortness of breath. EXAM: PORTABLE CHEST 1 VIEW COMPARISON:  02/27/2016 . FINDINGS: Endotracheal tube in stable position. Low lung volumes with mild bibasilar atelectasis. Interim improvement from prior exam. Left base atelectasis/ infiltrate has cleared. Interim near complete resolution of small bilateral pleural effusions. No pneumothorax . IMPRESSION: 1. Lines and tubes in stable position. 2. Low lung volumes with mild bibasilar subsegmental atelectasis. Interim significant improvement from prior exam. Interim near complete resolution of small bilateral pleural effusions . Electronically Signed   By: Marcello Moores  Register   On: 02/28/2016 06:54   Dg Chest Port 1 View  Result Date: 02/27/2016 CLINICAL DATA:  Respiratory failure. EXAM: PORTABLE CHEST 1 VIEW COMPARISON:  02/26/2016. FINDINGS: Endotracheal tube, left IJ line in stable position. Heart size normal. Low lung volumes with basilar atelectasis. Developing left base infiltrate. Tiny pleural effusions  cannot be excluded. No pneumothorax . IMPRESSION: 1. Lines and tubes in stable position. 2. Low lung volumes with basilar atelectasis. Developing left lower lobe infiltrate. Small bilateral pleural effusions cannot be excluded. Electronically Signed   By: Marcello Moores  Register   On: 02/27/2016 07:13   Dg Chest Port 1 View  Result Date: 02/26/2016 CLINICAL DATA:  Acute respiratory failure. EXAM: PORTABLE CHEST 1 VIEW COMPARISON:  02/25/2016 FINDINGS: Endotracheal tube has tip 3.6 cm above the carina. Left IJ central venous catheters unchanged with tip over the right atrium. Lungs are hypoinflated with linear atelectasis over the right midlung. Mild patchy opacification over the medial right upper lobe. Mild stable cardiomegaly. Remainder of the exam is unchanged. IMPRESSION: Mild opacification over the medial right upper lobe as cannot exclude residual infection. Minimal linear atelectasis right midlung. Tubes and lines as described. Electronically Signed   By: Marin Olp M.D.   On: 02/26/2016 07:28   Dg Chest Port 1 View  Result Date: 02/25/2016 CLINICAL DATA:  Acute respiratory failure.  Endotracheal tube. EXAM: PORTABLE CHEST 1 VIEW COMPARISON:  02/24/2016 FINDINGS: Endotracheal tube and left IJ central venous catheter are unchanged. Lungs are hypoinflated with minimal right base opacification unchanged likely atelectasis. Subtle prominence of the perihilar markings. Cardiomediastinal silhouette and remainder of the exam is unchanged. IMPRESSION: Stable mild right base opacification likely atelectasis. Suggestion of minimal vascular congestion. Tubes and lines unchanged. Electronically Signed   By: Marin Olp M.D.   On: 02/25/2016 08:01   Dg Chest Port 1 View  Result Date: 02/24/2016 CLINICAL DATA:  Acute respiratory failure. EXAM: PORTABLE CHEST 1 VIEW COMPARISON:  02/23/2016 and chest CT 02/22/2016 FINDINGS: Endotracheal  tube unchanged. Interval placement of left IJ central venous catheter which  has tip at the cavoatrial junction. Patient slightly rotated to the right. Lungs are hypoinflated with mild stable bibasilar opacification which may be due to atelectasis or infection. Increased density in the right hilar region slightly worse. No pneumothorax. Cardiomediastinal silhouette and remainder of the exam is unchanged. IMPRESSION: Persistent bibasilar opacification with slight worsening right hilar opacification suggesting atelectasis or infection. Tubes and lines as described. Electronically Signed   By: Marin Olp M.D.   On: 02/24/2016 08:44   Dg Chest Port 1 View  Result Date: 02/23/2016 CLINICAL DATA:  Initial evaluation for acute respiratory failure. EXAM: PORTABLE CHEST 1 VIEW COMPARISON:  Prior radiograph from 02/22/2016. FINDINGS: Patient remains intubated with the tip of an endotracheal tube positioned 2.9 cm above the carina. Cardiac and mediastinal silhouettes are stable, and remain within normal limits. Lungs are hypoinflated. Patchy bibasilar opacities, right greater than left, again suspicious for possible multi lobar pneumonia. Overall, these are slightly more dense as compared to the prior exam. Underlying pulmonary interstitial edema is improved. Asymmetric prominence of the pulmonary vasculature within the right lung favored to be related to patient rotation on this exam. No pneumothorax. No acute osseous abnormality. IMPRESSION: 1. Tip of the endotracheal tube 2.9 cm above the carina. 2. Shallow lung inflation with persistent right greater than left bibasilar opacities, compatible with multi lobar pneumonia. Overall, these are slightly more dense in appearance as compared to previous exam. 3. Improved/resolved underlying pulmonary interstitial edema. Electronically Signed   By: Jeannine Boga M.D.   On: 02/23/2016 06:30   Dg Chest Port 1 View  Result Date: 02/22/2016 CLINICAL DATA:  Respiratory failure. EXAM: PORTABLE CHEST 1 VIEW COMPARISON:  CT chest 02/22/2016  FINDINGS: Endotracheal tube with the tip 4.5 cm above the carina. Patchy areas of airspace disease in the right upper and lower lobes. Patchy airspace disease in the left lower lobe. No pleural effusion or pneumothorax. Stable cardiomediastinal silhouette. IMPRESSION: 1. Endotracheal tube with the tip 4.5 cm above the carina. 2. Airspace disease in the right upper, right lower and left lower lobes most consistent with multi lobar pneumonia. Electronically Signed   By: Kathreen Devoid   On: 02/22/2016 16:03   Dg Chest Portable 1 View  Result Date: 02/22/2016 CLINICAL DATA:  46 year old female status post ET tube placement. Patient's septic. EXAM: PORTABLE CHEST 1 VIEW COMPARISON:  Chest radiograph dated 07/28/2015 FINDINGS: An endotracheal tube is seen with tip approximately 3 cm above the carina. An enteric tube extends into the left upper abdomen with tip and side-port likely in the proximal stomach. There is near complete consolidative changes of the right upper lobe with collapse. There is near complete consolidative changes of the right middle lobe. The lucent area within the right hemithorax likely represents and aerated portion of the lung. A pneumothorax is less likely as there appears to be lung markings, but not entirely excluded. Further evaluation with CT is recommended. The left lung is clear.  The osseous structures are intact. IMPRESSION: Endotracheal tube above the carina. Large consolidative areas primarily in the right upper lobe and right middle lobe. A centrally obstructing mass or endobronchial lesion is not excluded. CT is recommended for further evaluation. Probable aerated portion of the right lower lobe versus less likely a pneumothorax. These results were called by telephone at the time of interpretation on 02/22/2016 at 6:45 am to Dr. Pryor Curia , who verbally acknowledged these results. Electronically Signed  By: Anner Crete M.D.   On: 02/22/2016 06:47     LOS: 24 days    Oren Binet, MD  [facility] Pager:[phone]  If 7PM-7AM, please contact night-coverage [email or web address] Password [email or web address] 03/17/2016, 2:20 PM

## 2016-03-17 NOTE — Progress Notes (Signed)
Sutures removed from trach site, per MD order, without complications. 

## 2016-03-18 LAB — GLUCOSE, CAPILLARY
GLUCOSE-CAPILLARY: 106 mg/dL — AB (ref 65–99)
Glucose-Capillary: 110 mg/dL — ABNORMAL HIGH (ref 65–99)
Glucose-Capillary: 111 mg/dL — ABNORMAL HIGH (ref 65–99)
Glucose-Capillary: 111 mg/dL — ABNORMAL HIGH (ref 65–99)
Glucose-Capillary: 113 mg/dL — ABNORMAL HIGH (ref 65–99)
Glucose-Capillary: 123 mg/dL — ABNORMAL HIGH (ref 65–99)
Glucose-Capillary: 87 mg/dL (ref 65–99)

## 2016-03-18 LAB — BASIC METABOLIC PANEL
Anion gap: 8 (ref 5–15)
BUN: 20 mg/dL (ref 6–20)
CALCIUM: 8.9 mg/dL (ref 8.9–10.3)
CO2: 30 mmol/L (ref 22–32)
CREATININE: 0.67 mg/dL (ref 0.44–1.00)
Chloride: 101 mmol/L (ref 101–111)
GFR calc Af Amer: >60 mL/min (ref >60)
GLUCOSE: 106 mg/dL — AB (ref 65–99)
Potassium: 3.7 mmol/L (ref 3.5–5.1)
Sodium: 139 mmol/L (ref 135–145)

## 2016-03-18 NOTE — Progress Notes (Signed)
PCCM Progress Note  Subjective: Remains on vent support.  Vital signs: BP 105/75   Pulse 96   Temp 98.4 F (36.9 C) (Oral)   Resp 12   Ht _0  (1.651 m)   Wt 145 lb 11.6 oz (66.1 kg)   SpO2 100%   BMI 24.25 kg/m   Intake/outpt: I/O last 3 completed shifts: In: 4555.5 [I.V.:510; NG/GT:3245.5; IV Piggyback:800] Out: 1778 [Urine:1453; Drains:175; Stool:150]  General: unresponsive Neuro: vegetative state Eyes: stares into space ENT: trach site clean Cardiac: regular, no murmur Chest: b/l crackles Abd: soft, non tender Ext: contracted Skin: no rashes   CMP Latest Ref Rng & Units 03/18/2016 03/17/2016 03/16/2016  Glucose 65 - 99 mg/dL 106(H) 107(H) 108(H)  BUN 6 - 20 mg/dL 20 20 23(H)  Creatinine 0.44 - 1.00 mg/dL 0.67 0.67 0.60  Sodium 135 - 145 mmol/L 139 138 138  Potassium 3.5 - 5.1 mmol/L 3.7 3.4(L) 3.3(L)  Chloride 101 - 111 mmol/L 101 99(L) 99(L)  CO2 22 - 32 mmol/L _1 Calcium 8.9 - 10.3 mg/dL 8.9 8.8(L) 8.8(L)  Total Protein 6.5 - 8.1 g/dL - 6.7 -  Total Bilirubin 0.3 - 1.2 mg/dL - 0.5 -  Alkaline Phos 38 - 126 U/L - 79 -  AST 15 - 41 U/L - 33 -  ALT 14 - 54 U/L - 44 -    CBC Latest Ref Rng & Units 03/17/2016 03/16/2016 03/14/2016  WBC 4.0 - 10.5 K/uL 13.9(H) 12.5(H) 14.6(H)  Hemoglobin 12.0 - 15.0 g/dL 9.3(L) 8.7(L) 8.8(L)  Hematocrit 36.0 - 46.0 % 29.4(L) 28.1(L) 28.9(L)  Platelets 150 - 400 K/uL 338 347 381    ABG    Component Value Date/Time   PHART 7.511 (H) 03/08/2016 1000   PCO2ART 29.3 (L) 03/08/2016 1000   PO2ART 119 (H) 03/08/2016 1000   HCO3 23.3 03/08/2016 1000   TCO2 27 02/22/2016 1155   ACIDBASEDEF 1.3 02/23/2016 0325   O2SAT 98.5 03/08/2016 1000    CBG (last 3)   Recent Labs  03/17/16 2346 03/18/16 0032 03/18/16 0324  GLUCAP 70 123* 87    Imaging: No results found.  Cultures: Sputum 12/21 >> Pseudomonas, MRSA C diff PCR 12/21 >> positive GI PCR 12/21 >> Norovirus Urine 1/03 >> Proteus, E coli BAL 1/04 >>  Pseudomonas, MRSA  Lines/tubes: ETT 12/21 >> 1/04 Trach 1/04 (DF) >>   Summary: 46 yo female presented with septic shock from [facility] on 49/44/96 complicated by respiratory failure and required tracheostomy.  She has PMHx of frontotemporal dementia.  Assessment/plan:  Acute on chronic respiratory failure in setting of HCAP. Failure to wean s/p tracheostomy. - continue vent support - f/u CXR intermittently - Abx per primary team  Disposition - I do not see any hope for her to have any type of recovery.  Family wishes to continue aggressive care.  No LTAC benefits.  PCCM will f/u weekly >> call if help needed in between.  Chesley Mires, MD [facility] Pulmonary/Critical Care 03/18/2016, 9:03 AM Pager:  [phone] After 3pm call: [phone]

## 2016-03-18 NOTE — Progress Notes (Signed)
Notified Md about pt's foley leaking.  ? Want to increase to 16Fr.  Will continue to monitor. Karena Addisonoro, Melesio Madara T

## 2016-03-18 NOTE — Progress Notes (Signed)
Pt's cbg = 123 will continue to monitor.Karena Addisonoro, [name] [name] T

## 2016-03-18 NOTE — Progress Notes (Signed)
PROGRESS NOTE        PATIENT DETAILS Name: [name] [name] Age: 46 y.o. Sex: female Date of Birth: 1970/08/23 Admit Date: 02/22/2016 Admitting Physician Marshell Garfinkel, MD RDE:YCXK, SAAD, MD  Brief Narrative: Patient is a 46 y.o. female with past medical history of frontotemporal dementia of an resident of a skilled nursing facility-apparently contracted and aphasic at baseline-brought to the hospital on 02/22/16 with acute respiratory failure from multilobar pneumonia. She was intubated on admission, and was difficult to wean and subsequently required a tracheostomy on 1/4. She is still ventilator dependent. Hospital course was complicated by development of multiple electrolyte abnormalities, and possible C. difficile colitis. See below for further details  Subjective: Lying comfortably in bed-remains unchanged-no major issues overnight per RN  Assessment/Plan: Acute hypoxemic respiratory failure: Secondary to pneumonia.Intubated and unable to wean, tracheostomy placed 1/. [facility] following for vent/trach care. Spoke with social work-needs vent-SNF at discharge, social work looking for numerous facilities, but as of now has a bed at [facility] on 03/21/2016  Septic shock secondary to multilobar pneumonia, C Diff colitis, ESBL UTI: Sepsis pathophysiology has resolved. Mild leukocytosis persists-but patient is afebrile. Her blood cultures on 1/2 are negative, BAL culture on 1/4 positive for Pseudomonas and MRSA, urine culture positive for ESBL Escherichia coli and Proteus. Remains on empiric vancomycin and meropenem with stop date of 1/20. Furthermore, hospital course was complicated by diarrhea-thought to be due to a combination of normal virus and C. difficile colitis and remains on vancomycin (stop date 1/27-with treatment extended for 1 week following completion of tx for MRSA/Pseudomonas) through the PEG tube.Per RN stools getting more formed.  Anemia: Likely  secondary to acute illness, follow.  Chronic dysphagia: Has a PEG tube in place.  History of frontotemporal dementia: Reviewed H&P-contracted, not verbal and bedbound at discharge. Resident of SNF.  Palliative care/goals of care: Very poor prognoses-seen by palliative care during this hospital stay, family still desires full aggressive care in spite of poor overall prognoses.  DVT Prophylaxis: Prophylactic Lovenox   Code Status: Full code   Family Communication: None at bedside  Disposition Plan: Remain inpatient-SNF on discharge-when bed available. Per SW-not a LTACH candidate  Antimicrobial agents: Anti-infectives    Start     Dose/Rate Route Frequency Ordered Stop   03/17/16 2300  vancomycin (VANCOCIN) 1,250 mg in sodium chloride 0.9 % 250 mL IVPB     1,250 mg 166.7 mL/hr over 90 Minutes Intravenous Every 24 hours 03/17/16 1711 03/23/16 2359   03/17/16 2200  vancomycin (VANCOCIN) 500 mg in sodium chloride 0.9 % 100 mL IVPB  Status:  Discontinued     500 mg 100 mL/hr over 60 Minutes Intravenous Every 12 hours 03/17/16 1658 03/17/16 1711   03/14/16 1600  vancomycin (VANCOCIN) IVPB 750 mg/150 ml premix  Status:  Discontinued     750 mg 150 mL/hr over 60 Minutes Intravenous Every 12 hours 03/14/16 0526 03/17/16 1658   03/10/16 1400  vancomycin (VANCOCIN) 50 mg/mL oral solution 125 mg     125 mg Oral 4 times daily 03/10/16 1230 03/30/16 2359   03/10/16 0400  vancomycin (VANCOCIN) 500 mg in sodium chloride 0.9 % 100 mL IVPB  Status:  Discontinued     500 mg 100 mL/hr over 60 Minutes Intravenous Every 12 hours 03/09/16 1524 03/14/16 0526   03/09/16 1600  meropenem (MERREM)  1 g in sodium chloride 0.9 % 100 mL IVPB     1 g 200 mL/hr over 30 Minutes Intravenous Every 8 hours 03/09/16 1524 03/23/16 2359   03/09/16 1600  vancomycin (VANCOCIN) IVPB 1000 mg/200 mL premix     1,000 mg 200 mL/hr over 60 Minutes Intravenous  Once 03/09/16 1524 03/09/16 1748   03/08/16 1100  cefTAZidime  (FORTAZ) 1 g in dextrose 5 % 50 mL IVPB  Status:  Discontinued     1 g 100 mL/hr over 30 Minutes Intravenous Every 12 hours 03/08/16 0945 03/09/16 1511   03/07/16 1400  vancomycin (VANCOCIN) 50 mg/mL oral solution 125 mg  Status:  Discontinued     125 mg Oral 4 times daily 03/07/16 1043 03/09/16 1518   03/06/16 1300  cefTRIAXone (ROCEPHIN) 1 g in dextrose 5 % 50 mL IVPB  Status:  Discontinued     1 g 100 mL/hr over 30 Minutes Intravenous Every 24 hours 03/06/16 1158 03/08/16 0945   02/26/16 1700  vancomycin (VANCOCIN) IVPB 1000 mg/200 mL premix  Status:  Discontinued     1,000 mg 200 mL/hr over 60 Minutes Intravenous Every 8 hours 02/26/16 1026 02/26/16 1028   02/26/16 1030  ciprofloxacin (CIPRO) IVPB 400 mg  Status:  Discontinued     400 mg 200 mL/hr over 60 Minutes Intravenous Every 12 hours 02/26/16 1026 03/02/16 0949   02/25/16 1930  vancomycin (VANCOCIN) IVPB 1000 mg/200 mL premix  Status:  Discontinued     1,000 mg 200 mL/hr over 60 Minutes Intravenous Every 8 hours 02/25/16 1858 02/26/16 1017   02/24/16 1915  vancomycin (VANCOCIN) IVPB 1000 mg/200 mL premix  Status:  Discontinued     1,000 mg 200 mL/hr over 60 Minutes Intravenous Every 8 hours 02/24/16 1106 02/25/16 0927   02/24/16 1115  vancomycin (VANCOCIN) 1,250 mg in sodium chloride 0.9 % 250 mL IVPB     1,250 mg 166.7 mL/hr over 90 Minutes Intravenous  Once 02/24/16 1106 02/24/16 1430   02/22/16 1800  vancomycin (VANCOCIN) IVPB 750 mg/150 ml premix  Status:  Discontinued     750 mg 150 mL/hr over 60 Minutes Intravenous Every 8 hours 02/22/16 0949 02/24/16 1106   02/22/16 1430  vancomycin (VANCOCIN) 50 mg/mL oral solution 125 mg     125 mg Oral 4 times daily 02/22/16 1420 03/07/16 1359   02/22/16 1400  piperacillin-tazobactam (ZOSYN) IVPB 3.375 g  Status:  Discontinued     3.375 g 12.5 mL/hr over 240 Minutes Intravenous Every 8 hours 02/22/16 0949 02/26/16 0955   02/22/16 1030  vancomycin (VANCOCIN) 500 mg in sodium chloride  0.9 % 100 mL IVPB     500 mg 100 mL/hr over 60 Minutes Intravenous  Once 02/22/16 0949 02/22/16 1212   02/22/16 0900  azithromycin (ZITHROMAX) 500 mg in dextrose 5 % 250 mL IVPB  Status:  Discontinued     500 mg 250 mL/hr over 60 Minutes Intravenous Every 24 hours 02/22/16 0846 02/24/16 0859   02/22/16 0615  piperacillin-tazobactam (ZOSYN) IVPB 3.375 g     3.375 g 100 mL/hr over 30 Minutes Intravenous  Once 02/22/16 0601 02/22/16 0708   02/22/16 0615  vancomycin (VANCOCIN) IVPB 1000 mg/200 mL premix     1,000 mg 200 mL/hr over 60 Minutes Intravenous  Once 02/22/16 0601 02/22/16 0748      Procedures: ETT 12/21>>1/4 Trach 1/4 >> Left IJ 12/22 >> Foley >> PEG >> Rectal tube >>  CONSULTS: PCCM  Palliative Care  Time  spent: 25- minutes-Greater than 50% of this time was spent in counseling, explanation of diagnosis, planning of further management, and coordination of care.  MEDICATIONS: Scheduled Meds: . chlorhexidine gluconate (MEDLINE KIT)  15 mL Mouth Rinse BID  . enoxaparin (LOVENOX) injection  40 mg Subcutaneous Q24H  . famotidine  20 mg Per Tube BID  . free water  200 mL Per Tube Q6H  . furosemide  20 mg Intravenous Daily  . insulin aspart  2-6 Units Subcutaneous Q4H  . mouth rinse  15 mL Mouth Rinse QID  . meropenem (MERREM) IV  1 g Intravenous Q8H  . vancomycin  1,250 mg Intravenous Q24H  . vancomycin  125 mg Oral QID   Continuous Infusions: . feeding supplement (VITAL AF 1.2 CAL) 1,000 mL (03/17/16 2013)   PRN Meds:.sodium chloride, acetaminophen, albuterol, ondansetron (ZOFRAN) IV   PHYSICAL EXAM: Vital signs: Vitals:   03/18/16 0357 03/18/16 0623 03/18/16 0747 03/18/16 0808  BP:  96/71 105/75 105/75  Pulse:  (!) 103 96 96  Resp:  _0 Temp:   98.4 F (36.9 C)   TempSrc:   Oral   SpO2: 99% 100% 100% 100%  Weight:      Height:       Filed Weights   03/16/16 0400 03/17/16 0540 03/18/16 0345  Weight: 68 kg (149 lb 14.6 oz) 59.1 kg (130 lb 4.7 oz)  66.1 kg (145 lb 11.6 oz)   Body mass index is 24.25 kg/m.   General exam: Lethargic, does not respond to voice. Not following any commands Respiratory system: Vent-trach. Course breath sounds  Cardiovascular system: S1 & S2 heard, tachycardic, regular. No JVD, murmurs, rubs, gallops or clicks.  Gastrointestinal system: Abdomen is nondistended, soft and nontender.Peg tube in pace Central nervous system: does not respond to voice  Extremities: Symmetric, contracted at knees, b/l wrist drop Skin: No rashes, lesions or ulcers on exposed skin    I have personally reviewed following labs and imaging studies  LABORATORY DATA: CBC:  Recent Labs Lab 03/12/16 0429 03/13/16 0438 03/14/16 0400 03/16/16 0432 03/17/16 0316  WBC 14.7* 14.7* 14.6* 12.5* 13.9*  NEUTROABS 9.8* 9.2* 9.3*  --   --   HGB 9.3* 9.2* 8.8* 8.7* 9.3*  HCT 30.2* 29.7* 28.9* 28.1* 29.4*  MCV 83.9 85.1 85.0 84.4 84.5  PLT 429* 399 381 347 757    Basic Metabolic Panel:  Recent Labs Lab 03/13/16 0438 03/14/16 0400 03/16/16 0432 03/17/16 0316 03/18/16 0355  NA 143 142 138 138 139  K 3.6 3.8 3.3* 3.4* 3.7  CL 105 103 99* 99* 101  CO2 _1 GLUCOSE 113* 114* 108* 107* 106*  BUN 27* 26* 23* 20 20  CREATININE 0.71 0.67 0.60 0.67 0.67  CALCIUM 9.1 8.9 8.8* 8.8* 8.9    GFR: Estimated Creatinine Clearance: 79.1 mL/min (by C-G formula based on SCr of 0.67 mg/dL).  Liver Function Tests:  Recent Labs Lab 03/17/16 0316  AST 33  ALT 44  ALKPHOS 79  BILITOT 0.5  PROT 6.7  ALBUMIN 2.6*   No results for input(s): LIPASE, AMYLASE in the last 168 hours. No results for input(s): AMMONIA in the last 168 hours.  Coagulation Profile: No results for input(s): INR, PROTIME in the last 168 hours.  Cardiac Enzymes: No results for input(s): CKTOTAL, CKMB, CKMBINDEX, TROPONINI in the last 168 hours.  BNP (last 3 results) No results for input(s): PROBNP in the last 8760 hours.  HbA1C: No results for  input(s): HGBA1C in the last 72 hours.  CBG:  Recent Labs Lab 03/17/16 2010 03/17/16 2341 03/17/16 2346 03/18/16 0032 03/18/16 0324  GLUCAP 112* 71 70 123* 87    Lipid Profile: No results for input(s): CHOL, HDL, LDLCALC, TRIG, CHOLHDL, LDLDIRECT in the last 72 hours.  Thyroid Function Tests: No results for input(s): TSH, T4TOTAL, FREET4, T3FREE, THYROIDAB in the last 72 hours.  Anemia Panel: No results for input(s): VITAMINB12, FOLATE, FERRITIN, TIBC, IRON, RETICCTPCT in the last 72 hours.  Urine analysis:    Component Value Date/Time   COLORURINE YELLOW 03/05/2016 1725   APPEARANCEUR HAZY (A) 03/05/2016 1725   LABSPEC 1.019 03/05/2016 1725   PHURINE 5.0 03/05/2016 1725   GLUCOSEU NEGATIVE 03/05/2016 1725   HGBUR SMALL (A) 03/05/2016 1725   BILIRUBINUR NEGATIVE 03/05/2016 1725   KETONESUR NEGATIVE 03/05/2016 1725   PROTEINUR 100 (A) 03/05/2016 1725   UROBILINOGEN 1.0 10/17/2013 1957   NITRITE NEGATIVE 03/05/2016 1725   LEUKOCYTESUR LARGE (A) 03/05/2016 1725    Sepsis Labs: Lactic Acid, Venous    Component Value Date/Time   LATICACIDVEN 2.9 (HH) 02/22/2016 1539    MICROBIOLOGY: No results found for this or any previous visit (from the past 240 hour(s)).  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 Code sepsis. EXAM: CT CHEST WITH CONTRAST TECHNIQUE: Multidetector CT imaging of the chest was performed during intravenous contrast administration. CONTRAST:  75 cc ISOVUE-300 IOPAMIDOL (ISOVUE-300) INJECTION 61% COMPARISON:  Chest radiograph from earlier today. FINDINGS: Cardiovascular: Normal heart size. No significant pericardial fluid/thickening. Great vessels are normal in course and caliber. No central pulmonary emboli. Mediastinum/Nodes: No discrete thyroid nodules. Unremarkable esophagus. No  pathologically enlarged axillary, mediastinal or hilar lymph nodes. Lungs/Pleura: No pneumothorax. No pleural effusion. Endotracheal tube tip is 2.8 cm above the carina. There is complete consolidation and severe volume loss in the right upper lobe. The right upper lobe bronchus is occluded with low attenuation material favoring mucous plugging. No discrete obstructing mass. There is moderate patchy consolidation and ground-glass attenuation throughout the right middle lobe, lingula and bilateral lower lobes (right greater than left), with associated mild volume loss in the lower lobes. No significant pulmonary nodules in the limited aerated portions of the lungs. Upper abdomen: Enteric tube terminates in the gastric fundus. Musculoskeletal:  No aggressive appearing focal osseous lesions. IMPRESSION: 1. Well-positioned endotracheal and enteric tubes. 2. Complete right upper lobe consolidation and significant volume loss with occlusion of the right upper lobe bronchus by low-attenuation material, favoring mucous plugging. No discrete obstructing mass. 3. Moderate patchy consolidation and ground-glass attenuation throughout the right middle lobe, lingula and bilateral lower lobes, favoring multilobar pneumonia and/or aspiration. Electronically Signed   By: Ilona Sorrel M.D.   On: 02/22/2016 10:41   Ir Fluoro Guide Cv Line Left  Result Date: 02/23/2016 INDICATION: 46 year old female with multi lobar pneumonia. EXAM: IR LEFT FLOURO GUIDE CV LINE; IR ULTRASOUND GUIDANCE VASC ACCESS LEFT MEDICATIONS: None ANESTHESIA/SEDATION: None FLUOROSCOPY TIME:  Fluoroscopy Time: 0 minutes 6 seconds (1 mGy). COMPLICATIONS: None PROCEDURE: Informed written consent was obtained from the patient's family after a thorough discussion of the procedural risks, benefits and alternatives. All questions were addressed. Maximal Sterile Barrier Technique was utilized including caps, mask, sterile gowns, sterile gloves, sterile drape, hand  hygiene and skin antiseptic. A timeout was performed prior to the initiation of the procedure. Ultrasound survey of  the left jugular region was performed with images stored and sent to PACs. The patient is prepped and draped in the usual sterile fashion. Skin and subcutaneous tissues were generously infiltrated 1% lidocaine for local anesthesia. Using ultrasound guidance, micropuncture set was used access the left internal jugular vein. Micro wire was passed through the needle into the right heart. Small incision was made in the needle was removed. Peel-away sheath was then placed, and a length of 19 cm was estimated. Triple-lumen 5 French catheter was then ligated at 19 cm and passed through the peel-away sheath. Peel-away sheath was removed and the catheter was sutured in position and flushed. Final image was stored. Patient tolerated the procedure well and remained hemodynamically stable throughout. No complications were encountered and no significant blood loss. IMPRESSION: Status post left IJ central catheter placement. Catheter ready for use. Signed, Dulcy Fanny. Earleen Newport, DO [facility] Electronically Signed   By: Corrie Mckusick D.O.   On: 02/23/2016 17:03   Ir US Guide Vasc Access Left  Result Date: 02/23/2016 INDICATION: 46 year old female with multi lobar pneumonia. EXAM: IR LEFT FLOURO GUIDE CV LINE; IR ULTRASOUND GUIDANCE VASC ACCESS LEFT MEDICATIONS: None ANESTHESIA/SEDATION: None FLUOROSCOPY TIME:  Fluoroscopy Time: 0 minutes 6 seconds (1 mGy). COMPLICATIONS: None PROCEDURE: Informed written consent was obtained from the patient's family after a thorough discussion of the procedural risks, benefits and alternatives. All questions were addressed. Maximal Sterile Barrier Technique was utilized including caps, mask, sterile gowns, sterile gloves, sterile drape, hand hygiene and skin antiseptic. A timeout was performed prior to the initiation of the  procedure. Ultrasound survey of the left jugular region was performed with images stored and sent to PACs. The patient is prepped and draped in the usual sterile fashion. Skin and subcutaneous tissues were generously infiltrated 1% lidocaine for local anesthesia. Using ultrasound guidance, micropuncture set was used access the left internal jugular vein. Micro wire was passed through the needle into the right heart. Small incision was made in the needle was removed. Peel-away sheath was then placed, and a length of 19 cm was estimated. Triple-lumen 5 French catheter was then ligated at 19 cm and passed through the peel-away sheath. Peel-away sheath was removed and the catheter was sutured in position and flushed. Final image was stored. Patient tolerated the procedure well and remained hemodynamically stable throughout. No complications were encountered and no significant blood loss. IMPRESSION: Status post left IJ central catheter placement. Catheter ready for use. Signed, Dulcy Fanny. Earleen Newport, DO [facility] Electronically Signed   By: Corrie Mckusick D.O.   On: 02/23/2016 17:03   Dg Chest Port 1 View  Result Date: 03/12/2016 CLINICAL DATA:  Acute respiratory failure with hypoxia. Tracheostomy patient. EXAM: PORTABLE CHEST 1 VIEW COMPARISON:  Portable chest x-ray of June 05, 2016 and March 04, 2016. FINDINGS: The lungs remain mildly hypoinflated. Patchy density at the left lung base persists but there has been marked improvement since a study of March 04, 2016. The left hemidiaphragm is mildly elevated. The heart and pulmonary vascularity are normal. The tracheostomy appliance tip projects at the superior margin of the clavicular heads. The left internal jugular venous catheter tip projects over the midportion of the SVC. The bony thorax exhibits no acute abnormality. IMPRESSION: Further interval improvement in the appearance of the left lower lobe  pneumonia. Electronically Signed   By: David  Martinique M.D.   On: 03/12/2016 12:46   Dg Chest Port 1  View  Result Date: 03/07/2016 CLINICAL DATA:  Respiratory failure. EXAM: PORTABLE CHEST 1 VIEW COMPARISON:  03/04/2016. FINDINGS: Interim placement of tracheostomy tube. Left IJ line in stable position. Heart size stable. Low lung volumes with mild bibasilar infiltrates. Small left pleural effusion cannot be excluded. Elevation left hemidiaphragm again noted. IMPRESSION: 1. Tracheostomy tube in good anatomic position. Left IJ line in good anatomic position. 2. Low lung volumes. Persistent left base infiltrate. No change from prior exam. Electronically Signed   By: Marcello Moores  Register   On: 03/07/2016 16:09   Dg Chest Port 1 View  Result Date: 03/07/2016 CLINICAL DATA:  Intubated patient, acute respiratory failure, pneumonia EXAM: PORTABLE CHEST 1 VIEW COMPARISON:  Portable chest x-ray of March 04, 2016 FINDINGS: The lungs are borderline hypoinflated. The retrocardiac region on the left remains dense with obscuration of the mildly elevated hemidiaphragm. The heart is not enlarged. The pulmonary vascularity is not engorged. The endotracheal tube tip lies 5.6 cm above the carina. The left internal jugular venous catheter tip projects over the midportion of the SVC. IMPRESSION: Persistent left lower lobe atelectasis or pneumonia somewhat improved since the previous study. Probable small left pleural effusion. The support devices are in stable position. Electronically Signed   By: David  Martinique M.D.   On: 03/07/2016 06:49   Dg Chest Port 1 View  Result Date: 03/04/2016 CLINICAL DATA:  Acute respiratory failure. EXAM: PORTABLE CHEST 1 VIEW COMPARISON:  03/01/2016 FINDINGS: Endotracheal tube is in place with tip 4.1 cm above the carina. Left IJ central line tip to the superior vena cava. There is elevation of left hemidiaphragm. There is increasing opacity within the left lower lobe. No pulmonary edema. Right lung  remains clear. IMPRESSION: Increasing left lower lobe infiltrate. Stable elevation of left hemidiaphragm. Electronically Signed   By: Nolon Nations M.D.   On: 03/04/2016 07:25   Dg Chest Port 1 View  Result Date: 03/01/2016 CLINICAL DATA:  Hypoxia EXAM: PORTABLE CHEST 1 VIEW COMPARISON:  February 29, 2016 FINDINGS: Endotracheal tube tip is 3.2 cm above the carina. Central catheter tip is at the cavoatrial junction. No pneumothorax. There is patchy consolidation in the left base with small left pleural effusion. Right lung is clear. Heart is upper normal in size with pulmonary vascularity within normal limits. No adenopathy. No bone lesions. IMPRESSION: Tube and catheter positions as described without pneumothorax. Consolidation left base with small left pleural effusion. Right lung clear. Electronically Signed   By: Lowella Grip III M.D.   On: 03/01/2016 07:50   Dg Chest Port 1 View  Result Date: 02/29/2016 CLINICAL DATA:  Hypoxia EXAM: PORTABLE CHEST 1 VIEW COMPARISON:  February 28, 2016 FINDINGS: Endotracheal tube tip is 2.3 cm above the carina. Central catheter tip is at the cavoatrial junction. No pneumothorax. There is mild bibasilar atelectasis. Lungs elsewhere clear. Heart size and pulmonary vascularity are normal. No adenopathy. No bone lesions. IMPRESSION: Tube and catheter positions as described without pneumothorax. Bibasilar atelectasis. No frank edema or consolidation. Stable cardiac silhouette. Electronically Signed   By: Lowella Grip III M.D.   On: 02/29/2016 07:07   Dg Chest Port 1 View  Result Date: 02/28/2016 CLINICAL DATA:  Respiratory failure.  Shortness of breath. EXAM: PORTABLE CHEST 1 VIEW COMPARISON:  02/27/2016 . FINDINGS: Endotracheal tube in stable position. Low lung volumes with mild bibasilar atelectasis. Interim improvement from prior exam. Left base atelectasis/ infiltrate has cleared. Interim near complete resolution of small bilateral pleural effusions. No  pneumothorax . IMPRESSION: 1.  Lines and tubes in stable position. 2. Low lung volumes with mild bibasilar subsegmental atelectasis. Interim significant improvement from prior exam. Interim near complete resolution of small bilateral pleural effusions . Electronically Signed   By: Marcello Moores  Register   On: 02/28/2016 06:54   Dg Chest Port 1 View  Result Date: 02/27/2016 CLINICAL DATA:  Respiratory failure. EXAM: PORTABLE CHEST 1 VIEW COMPARISON:  02/26/2016. FINDINGS: Endotracheal tube, left IJ line in stable position. Heart size normal. Low lung volumes with basilar atelectasis. Developing left base infiltrate. Tiny pleural effusions cannot be excluded. No pneumothorax . IMPRESSION: 1. Lines and tubes in stable position. 2. Low lung volumes with basilar atelectasis. Developing left lower lobe infiltrate. Small bilateral pleural effusions cannot be excluded. Electronically Signed   By: Marcello Moores  Register   On: 02/27/2016 07:13   Dg Chest Port 1 View  Result Date: 02/26/2016 CLINICAL DATA:  Acute respiratory failure. EXAM: PORTABLE CHEST 1 VIEW COMPARISON:  02/25/2016 FINDINGS: Endotracheal tube has tip 3.6 cm above the carina. Left IJ central venous catheters unchanged with tip over the right atrium. Lungs are hypoinflated with linear atelectasis over the right midlung. Mild patchy opacification over the medial right upper lobe. Mild stable cardiomegaly. Remainder of the exam is unchanged. IMPRESSION: Mild opacification over the medial right upper lobe as cannot exclude residual infection. Minimal linear atelectasis right midlung. Tubes and lines as described. Electronically Signed   By: Marin Olp M.D.   On: 02/26/2016 07:28   Dg Chest Port 1 View  Result Date: 02/25/2016 CLINICAL DATA:  Acute respiratory failure.  Endotracheal tube. EXAM: PORTABLE CHEST 1 VIEW COMPARISON:  02/24/2016 FINDINGS: Endotracheal tube and left IJ central venous catheter are unchanged. Lungs are hypoinflated with minimal right  base opacification unchanged likely atelectasis. Subtle prominence of the perihilar markings. Cardiomediastinal silhouette and remainder of the exam is unchanged. IMPRESSION: Stable mild right base opacification likely atelectasis. Suggestion of minimal vascular congestion. Tubes and lines unchanged. Electronically Signed   By: Marin Olp M.D.   On: 02/25/2016 08:01   Dg Chest Port 1 View  Result Date: 02/24/2016 CLINICAL DATA:  Acute respiratory failure. EXAM: PORTABLE CHEST 1 VIEW COMPARISON:  02/23/2016 and chest CT 02/22/2016 FINDINGS: Endotracheal tube unchanged. Interval placement of left IJ central venous catheter which has tip at the cavoatrial junction. Patient slightly rotated to the right. Lungs are hypoinflated with mild stable bibasilar opacification which may be due to atelectasis or infection. Increased density in the right hilar region slightly worse. No pneumothorax. Cardiomediastinal silhouette and remainder of the exam is unchanged. IMPRESSION: Persistent bibasilar opacification with slight worsening right hilar opacification suggesting atelectasis or infection. Tubes and lines as described. Electronically Signed   By: Marin Olp M.D.   On: 02/24/2016 08:44   Dg Chest Port 1 View  Result Date: 02/23/2016 CLINICAL DATA:  Initial evaluation for acute respiratory failure. EXAM: PORTABLE CHEST 1 VIEW COMPARISON:  Prior radiograph from 02/22/2016. FINDINGS: Patient remains intubated with the tip of an endotracheal tube positioned 2.9 cm above the carina. Cardiac and mediastinal silhouettes are stable, and remain within normal limits. Lungs are hypoinflated. Patchy bibasilar opacities, right greater than left, again suspicious for possible multi lobar pneumonia. Overall, these are slightly more dense as compared to the prior exam. Underlying pulmonary interstitial edema is improved. Asymmetric prominence of the pulmonary vasculature within the right lung favored to be related to patient  rotation on this exam. No pneumothorax. No acute osseous abnormality. IMPRESSION: 1. Tip of the endotracheal tube  2.9 cm above the carina. 2. Shallow lung inflation with persistent right greater than left bibasilar opacities, compatible with multi lobar pneumonia. Overall, these are slightly more dense in appearance as compared to previous exam. 3. Improved/resolved underlying pulmonary interstitial edema. Electronically Signed   By: Jeannine Boga M.D.   On: 02/23/2016 06:30   Dg Chest Port 1 View  Result Date: 02/22/2016 CLINICAL DATA:  Respiratory failure. EXAM: PORTABLE CHEST 1 VIEW COMPARISON:  CT chest 02/22/2016 FINDINGS: Endotracheal tube with the tip 4.5 cm above the carina. Patchy areas of airspace disease in the right upper and lower lobes. Patchy airspace disease in the left lower lobe. No pleural effusion or pneumothorax. Stable cardiomediastinal silhouette. IMPRESSION: 1. Endotracheal tube with the tip 4.5 cm above the carina. 2. Airspace disease in the right upper, right lower and left lower lobes most consistent with multi lobar pneumonia. Electronically Signed   By: Kathreen Devoid   On: 02/22/2016 16:03   Dg Chest Portable 1 View  Result Date: 02/22/2016 CLINICAL DATA:  46 year old female status post ET tube placement. Patient's septic. EXAM: PORTABLE CHEST 1 VIEW COMPARISON:  Chest radiograph dated 07/28/2015 FINDINGS: An endotracheal tube is seen with tip approximately 3 cm above the carina. An enteric tube extends into the left upper abdomen with tip and side-port likely in the proximal stomach. There is near complete consolidative changes of the right upper lobe with collapse. There is near complete consolidative changes of the right middle lobe. The lucent area within the right hemithorax likely represents and aerated portion of the lung. A pneumothorax is less likely as there appears to be lung markings, but not entirely excluded. Further evaluation with CT is recommended. The  left lung is clear.  The osseous structures are intact. IMPRESSION: Endotracheal tube above the carina. Large consolidative areas primarily in the right upper lobe and right middle lobe. A centrally obstructing mass or endobronchial lesion is not excluded. CT is recommended for further evaluation. Probable aerated portion of the right lower lobe versus less likely a pneumothorax. These results were called by telephone at the time of interpretation on 02/22/2016 at 6:45 am to Dr. Pryor Curia , who verbally acknowledged these results. Electronically Signed   By: Anner Crete M.D.   On: 02/22/2016 06:47     LOS: 25 days   Oren Binet, MD  [facility] Pager:[phone]  If 7PM-7AM, please contact night-coverage [email or web address] Password [email or web address] 03/18/2016, 9:09 AM

## 2016-03-19 LAB — CBC
HCT: 29.2 % — ABNORMAL LOW (ref 36.0–46.0)
Hemoglobin: 8.9 g/dL — ABNORMAL LOW (ref 12.0–15.0)
MCH: 25.8 pg — ABNORMAL LOW (ref 26.0–34.0)
MCHC: 30.5 g/dL (ref 30.0–36.0)
MCV: 84.6 fL (ref 78.0–100.0)
Platelets: 304 K/uL (ref 150–400)
RBC: 3.45 MIL/uL — ABNORMAL LOW (ref 3.87–5.11)
RDW: 17.6 % — ABNORMAL HIGH (ref 11.5–15.5)
WBC: 13.3 K/uL — ABNORMAL HIGH (ref 4.0–10.5)

## 2016-03-19 LAB — GLUCOSE, CAPILLARY
GLUCOSE-CAPILLARY: 110 mg/dL — AB (ref 65–99)
Glucose-Capillary: 119 mg/dL — ABNORMAL HIGH (ref 65–99)
Glucose-Capillary: 91 mg/dL (ref 65–99)
Glucose-Capillary: 93 mg/dL (ref 65–99)

## 2016-03-19 NOTE — Progress Notes (Signed)
CSW scheduled transport by [facility] for discharge to [facility] on Thursday 1/18 at 12pm. CSW spoke with patient's sister this morning regarding [facility] facility options. So far, no other facilities have made bed offers, so pt will need to dc to [facility].   Osborne Cascoadia Lionardo Haze LCSWA [phone]

## 2016-03-19 NOTE — Care Management Important Message (Signed)
Important Message  Patient Details  Name: [name] SchmidtChevella [name] MRN: [id number] Date of Birth: 02/23/1971   Medicare Important Message Given:  Yes    Hanley HaysDowell, Brentyn Seehafer T, RN 03/19/2016, 10:06 AM

## 2016-03-19 NOTE — Progress Notes (Signed)
PROGRESS NOTE        PATIENT DETAILS Name: [name] [name] Age: 46 y.o. Sex: female Date of Birth: 06-25-1970 Admit Date: 02/22/2016 Admitting Physician Marshell Garfinkel, MD VQM:GQQP, SAAD, MD  Brief Narrative: Patient is a 46 y.o. female with past medical history of frontotemporal dementia of an resident of a skilled nursing facility-apparently contracted and aphasic at baseline-brought to the hospital on 02/22/16 with acute respiratory failure from multilobar pneumonia. She was intubated on admission, and was difficult to wean and subsequently required a tracheostomy on 1/4. She is still ventilator dependent. Hospital course was complicated by development of multiple electrolyte abnormalities, and possible C. difficile colitis. See below for further details  Subjective: Unchanged-no major events overnight  Assessment/Plan: Acute hypoxemic respiratory failure: Secondary to pneumonia.Intubated and unable to wean, tracheostomy placed 1/. [facility] M following for vent/trach care. Spoke with social work-needs vent-SNF at discharge, social work looking for numerous facilities, but as of now has a bed at [facility] on 03/21/2016  Septic shock secondary to multilobar pneumonia, C Diff colitis, ESBL UTI: Sepsis pathophysiology has resolved. Mild leukocytosis persists-but patient is afebrile. Her blood cultures on 1/2 are negative, BAL culture on 1/4 positive for Pseudomonas and MRSA, urine culture positive for ESBL Escherichia coli and Proteus. Remains on empiric vancomycin and meropenem with stop date of 1/20. Furthermore, hospital course was complicated by diarrhea-thought to be due to a combination of normal virus and C. difficile colitis and remains on vancomycin (stop date 1/27-with treatment extended for 1 week following completion of tx for MRSA/Pseudomonas) through the PEG tube.Per RN stools getting more formed.  Anemia: Likely secondary to acute illness,  follow.  Chronic dysphagia: Has a PEG tube in place.  History of frontotemporal dementia: Reviewed H&P-contracted, not verbal and bedbound at discharge. Resident of SNF.  Palliative care/goals of care: Very poor prognoses-seen by palliative care during this hospital stay, family still desires full aggressive care in spite of poor overall prognoses.  DVT Prophylaxis: Prophylactic Lovenox   Code Status: Full code   Family Communication: None at bedside  Disposition Plan: Remain inpatient-SNF on discharge-when bed available. Per SW-not a LTACH candidate  Antimicrobial agents: Anti-infectives    Start     Dose/Rate Route Frequency Ordered Stop   03/17/16 2300  vancomycin (VANCOCIN) 1,250 mg in sodium chloride 0.9 % 250 mL IVPB     1,250 mg 166.7 mL/hr over 90 Minutes Intravenous Every 24 hours 03/17/16 1711 03/23/16 2359   03/17/16 2200  vancomycin (VANCOCIN) 500 mg in sodium chloride 0.9 % 100 mL IVPB  Status:  Discontinued     500 mg 100 mL/hr over 60 Minutes Intravenous Every 12 hours 03/17/16 1658 03/17/16 1711   03/14/16 1600  vancomycin (VANCOCIN) IVPB 750 mg/150 ml premix  Status:  Discontinued     750 mg 150 mL/hr over 60 Minutes Intravenous Every 12 hours 03/14/16 0526 03/17/16 1658   03/10/16 1400  vancomycin (VANCOCIN) 50 mg/mL oral solution 125 mg     125 mg Oral 4 times daily 03/10/16 1230 03/30/16 2359   03/10/16 0400  vancomycin (VANCOCIN) 500 mg in sodium chloride 0.9 % 100 mL IVPB  Status:  Discontinued     500 mg 100 mL/hr over 60 Minutes Intravenous Every 12 hours 03/09/16 1524 03/14/16 0526   03/09/16 1600  meropenem (MERREM) 1 g in sodium chloride 0.9 %  100 mL IVPB     1 g 200 mL/hr over 30 Minutes Intravenous Every 8 hours 03/09/16 1524 03/23/16 2359   03/09/16 1600  vancomycin (VANCOCIN) IVPB 1000 mg/200 mL premix     1,000 mg 200 mL/hr over 60 Minutes Intravenous  Once 03/09/16 1524 03/09/16 1748   03/08/16 1100  cefTAZidime (FORTAZ) 1 g in dextrose 5 %  50 mL IVPB  Status:  Discontinued     1 g 100 mL/hr over 30 Minutes Intravenous Every 12 hours 03/08/16 0945 03/09/16 1511   03/07/16 1400  vancomycin (VANCOCIN) 50 mg/mL oral solution 125 mg  Status:  Discontinued     125 mg Oral 4 times daily 03/07/16 1043 03/09/16 1518   03/06/16 1300  cefTRIAXone (ROCEPHIN) 1 g in dextrose 5 % 50 mL IVPB  Status:  Discontinued     1 g 100 mL/hr over 30 Minutes Intravenous Every 24 hours 03/06/16 1158 03/08/16 0945   02/26/16 1700  vancomycin (VANCOCIN) IVPB 1000 mg/200 mL premix  Status:  Discontinued     1,000 mg 200 mL/hr over 60 Minutes Intravenous Every 8 hours 02/26/16 1026 02/26/16 1028   02/26/16 1030  ciprofloxacin (CIPRO) IVPB 400 mg  Status:  Discontinued     400 mg 200 mL/hr over 60 Minutes Intravenous Every 12 hours 02/26/16 1026 03/02/16 0949   02/25/16 1930  vancomycin (VANCOCIN) IVPB 1000 mg/200 mL premix  Status:  Discontinued     1,000 mg 200 mL/hr over 60 Minutes Intravenous Every 8 hours 02/25/16 1858 02/26/16 1017   02/24/16 1915  vancomycin (VANCOCIN) IVPB 1000 mg/200 mL premix  Status:  Discontinued     1,000 mg 200 mL/hr over 60 Minutes Intravenous Every 8 hours 02/24/16 1106 02/25/16 0927   02/24/16 1115  vancomycin (VANCOCIN) 1,250 mg in sodium chloride 0.9 % 250 mL IVPB     1,250 mg 166.7 mL/hr over 90 Minutes Intravenous  Once 02/24/16 1106 02/24/16 1430   02/22/16 1800  vancomycin (VANCOCIN) IVPB 750 mg/150 ml premix  Status:  Discontinued     750 mg 150 mL/hr over 60 Minutes Intravenous Every 8 hours 02/22/16 0949 02/24/16 1106   02/22/16 1430  vancomycin (VANCOCIN) 50 mg/mL oral solution 125 mg     125 mg Oral 4 times daily 02/22/16 1420 03/07/16 1359   02/22/16 1400  piperacillin-tazobactam (ZOSYN) IVPB 3.375 g  Status:  Discontinued     3.375 g 12.5 mL/hr over 240 Minutes Intravenous Every 8 hours 02/22/16 0949 02/26/16 0955   02/22/16 1030  vancomycin (VANCOCIN) 500 mg in sodium chloride 0.9 % 100 mL IVPB     500  mg 100 mL/hr over 60 Minutes Intravenous  Once 02/22/16 0949 02/22/16 1212   02/22/16 0900  azithromycin (ZITHROMAX) 500 mg in dextrose 5 % 250 mL IVPB  Status:  Discontinued     500 mg 250 mL/hr over 60 Minutes Intravenous Every 24 hours 02/22/16 0846 02/24/16 0859   02/22/16 0615  piperacillin-tazobactam (ZOSYN) IVPB 3.375 g     3.375 g 100 mL/hr over 30 Minutes Intravenous  Once 02/22/16 0601 02/22/16 0708   02/22/16 0615  vancomycin (VANCOCIN) IVPB 1000 mg/200 mL premix     1,000 mg 200 mL/hr over 60 Minutes Intravenous  Once 02/22/16 0601 02/22/16 0748      Procedures: ETT 12/21>>1/4 Trach 1/4 >> Left IJ 12/22 >> Foley >> PEG >> Rectal tube >>  CONSULTS: PCCM  Palliative Care  Time spent: 25- minutes-Greater than 50% of this  time was spent in counseling, explanation of diagnosis, planning of further management, and coordination of care.  MEDICATIONS: Scheduled Meds: . chlorhexidine gluconate (MEDLINE KIT)  15 mL Mouth Rinse BID  . enoxaparin (LOVENOX) injection  40 mg Subcutaneous Q24H  . famotidine  20 mg Per Tube BID  . free water  200 mL Per Tube Q6H  . furosemide  20 mg Intravenous Daily  . insulin aspart  2-6 Units Subcutaneous Q4H  . mouth rinse  15 mL Mouth Rinse QID  . meropenem (MERREM) IV  1 g Intravenous Q8H  . vancomycin  1,250 mg Intravenous Q24H  . vancomycin  125 mg Oral QID   Continuous Infusions: . feeding supplement (VITAL AF 1.2 CAL) 1,000 mL (03/18/16 1940)   PRN Meds:.sodium chloride, acetaminophen, albuterol, ondansetron (ZOFRAN) IV   PHYSICAL EXAM: Vital signs: Vitals:   03/19/16 0304 03/19/16 0534 03/19/16 0821 03/19/16 0921  BP: (!) 83/59  101/76   Pulse: 89 95 84 97  Resp: _0 Temp: 98.3 F (36.8 C)  98.5 F (36.9 C)   TempSrc: Oral  Axillary   SpO2: 100% 100% 100% 100%  Weight:  66.6 kg (146 lb 13.2 oz)    Height:       Filed Weights   03/17/16 0540 03/18/16 0345 03/19/16 0534  Weight: 59.1 kg (130 lb 4.7 oz)  66.1 kg (145 lb 11.6 oz) 66.6 kg (146 lb 13.2 oz)   Body mass index is 24.43 kg/m.   General exam: Lethargic, does not respond to voice. Not following any commands Respiratory system: Vent-trach. Course breath sounds  Cardiovascular system: S1 & S2 heard, tachycardic, regular. No JVD, murmurs, rubs, gallops or clicks.  Gastrointestinal system: Abdomen is nondistended, soft and nontender.Peg tube in pace Central nervous system: does not respond to voice  Extremities: Symmetric, contracted at knees, b/l wrist drop Skin: No rashes, lesions or ulcers on exposed skin    I have personally reviewed following labs and imaging studies  LABORATORY DATA: CBC:  Recent Labs Lab 03/13/16 0438 03/14/16 0400 03/16/16 0432 03/17/16 0316 03/19/16 0300  WBC 14.7* 14.6* 12.5* 13.9* 13.3*  NEUTROABS 9.2* 9.3*  --   --   --   HGB 9.2* 8.8* 8.7* 9.3* 8.9*  HCT 29.7* 28.9* 28.1* 29.4* 29.2*  MCV 85.1 85.0 84.4 84.5 84.6  PLT 399 381 347 338 465    Basic Metabolic Panel:  Recent Labs Lab 03/13/16 0438 03/14/16 0400 03/16/16 0432 03/17/16 0316 03/18/16 0355  NA 143 142 138 138 139  K 3.6 3.8 3.3* 3.4* 3.7  CL 105 103 99* 99* 101  CO2 _1 GLUCOSE 113* 114* 108* 107* 106*  BUN 27* 26* 23* 20 20  CREATININE 0.71 0.67 0.60 0.67 0.67  CALCIUM 9.1 8.9 8.8* 8.8* 8.9    GFR: Estimated Creatinine Clearance: 79.1 mL/min (by C-G formula based on SCr of 0.67 mg/dL).  Liver Function Tests:  Recent Labs Lab 03/17/16 0316  AST 33  ALT 44  ALKPHOS 79  BILITOT 0.5  PROT 6.7  ALBUMIN 2.6*   No results for input(s): LIPASE, AMYLASE in the last 168 hours. No results for input(s): AMMONIA in the last 168 hours.  Coagulation Profile: No results for input(s): INR, PROTIME in the last 168 hours.  Cardiac Enzymes: No results for input(s): CKTOTAL, CKMB, CKMBINDEX, TROPONINI in the last 168 hours.  BNP (last 3 results) No results for input(s): PROBNP in the last 8760  hours.  HbA1C: No results for input(s): HGBA1C in the last 72 hours.  CBG:  Recent Labs Lab 03/18/16 1545 03/18/16 1952 03/18/16 2322 03/19/16 0257 03/19/16 0820  GLUCAP 113* 110* 106* 110* 119*    Lipid Profile: No results for input(s): CHOL, HDL, LDLCALC, TRIG, CHOLHDL, LDLDIRECT in the last 72 hours.  Thyroid Function Tests: No results for input(s): TSH, T4TOTAL, FREET4, T3FREE, THYROIDAB in the last 72 hours.  Anemia Panel: No results for input(s): VITAMINB12, FOLATE, FERRITIN, TIBC, IRON, RETICCTPCT in the last 72 hours.  Urine analysis:    Component Value Date/Time   COLORURINE YELLOW 03/05/2016 1725   APPEARANCEUR HAZY (A) 03/05/2016 1725   LABSPEC 1.019 03/05/2016 1725   PHURINE 5.0 03/05/2016 1725   GLUCOSEU NEGATIVE 03/05/2016 1725   HGBUR SMALL (A) 03/05/2016 1725   BILIRUBINUR NEGATIVE 03/05/2016 1725   KETONESUR NEGATIVE 03/05/2016 1725   PROTEINUR 100 (A) 03/05/2016 1725   UROBILINOGEN 1.0 10/17/2013 1957   NITRITE NEGATIVE 03/05/2016 1725   LEUKOCYTESUR LARGE (A) 03/05/2016 1725    Sepsis Labs: Lactic Acid, Venous    Component Value Date/Time   LATICACIDVEN 2.9 (HH) 02/22/2016 1539    MICROBIOLOGY: No results found for this or any previous visit (from the past 240 hour(s)).  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 Code sepsis. EXAM: CT CHEST WITH CONTRAST TECHNIQUE: Multidetector CT imaging of the chest was performed during intravenous contrast administration. CONTRAST:  75 cc ISOVUE-300 IOPAMIDOL (ISOVUE-300) INJECTION 61% COMPARISON:  Chest radiograph from earlier today. FINDINGS: Cardiovascular: Normal heart size. No significant pericardial fluid/thickening. Great vessels are normal in course and caliber. No central pulmonary emboli. Mediastinum/Nodes: No discrete thyroid  nodules. Unremarkable esophagus. No pathologically enlarged axillary, mediastinal or hilar lymph nodes. Lungs/Pleura: No pneumothorax. No pleural effusion. Endotracheal tube tip is 2.8 cm above the carina. There is complete consolidation and severe volume loss in the right upper lobe. The right upper lobe bronchus is occluded with low attenuation material favoring mucous plugging. No discrete obstructing mass. There is moderate patchy consolidation and ground-glass attenuation throughout the right middle lobe, lingula and bilateral lower lobes (right greater than left), with associated mild volume loss in the lower lobes. No significant pulmonary nodules in the limited aerated portions of the lungs. Upper abdomen: Enteric tube terminates in the gastric fundus. Musculoskeletal:  No aggressive appearing focal osseous lesions. IMPRESSION: 1. Well-positioned endotracheal and enteric tubes. 2. Complete right upper lobe consolidation and significant volume loss with occlusion of the right upper lobe bronchus by low-attenuation material, favoring mucous plugging. No discrete obstructing mass. 3. Moderate patchy consolidation and ground-glass attenuation throughout the right middle lobe, lingula and bilateral lower lobes, favoring multilobar pneumonia and/or aspiration. Electronically Signed   By: Ilona Sorrel M.D.   On: 02/22/2016 10:41   Ir Fluoro Guide Cv Line Left  Result Date: 02/23/2016 INDICATION: 46 year old female with multi lobar pneumonia. EXAM: IR LEFT FLOURO GUIDE CV LINE; IR ULTRASOUND GUIDANCE VASC ACCESS LEFT MEDICATIONS: None ANESTHESIA/SEDATION: None FLUOROSCOPY TIME:  Fluoroscopy Time: 0 minutes 6 seconds (1 mGy). COMPLICATIONS: None PROCEDURE: Informed written consent was obtained from the patient's family after a thorough discussion of the procedural risks, benefits and alternatives. All questions were addressed. Maximal Sterile Barrier Technique was utilized including caps, mask, sterile gowns,  sterile gloves, sterile drape, hand hygiene and skin antiseptic. A timeout was performed prior to the initiation of the  procedure. Ultrasound survey of the left jugular region was performed with images stored and sent to PACs. The patient is prepped and draped in the usual sterile fashion. Skin and subcutaneous tissues were generously infiltrated 1% lidocaine for local anesthesia. Using ultrasound guidance, micropuncture set was used access the left internal jugular vein. Micro wire was passed through the needle into the right heart. Small incision was made in the needle was removed. Peel-away sheath was then placed, and a length of 19 cm was estimated. Triple-lumen 5 French catheter was then ligated at 19 cm and passed through the peel-away sheath. Peel-away sheath was removed and the catheter was sutured in position and flushed. Final image was stored. Patient tolerated the procedure well and remained hemodynamically stable throughout. No complications were encountered and no significant blood loss. IMPRESSION: Status post left IJ central catheter placement. Catheter ready for use. Signed, Dulcy Fanny. Earleen Newport, DO [facility] Electronically Signed   By: Corrie Mckusick D.O.   On: 02/23/2016 17:03   Ir US Guide Vasc Access Left  Result Date: 02/23/2016 INDICATION: 46 year old female with multi lobar pneumonia. EXAM: IR LEFT FLOURO GUIDE CV LINE; IR ULTRASOUND GUIDANCE VASC ACCESS LEFT MEDICATIONS: None ANESTHESIA/SEDATION: None FLUOROSCOPY TIME:  Fluoroscopy Time: 0 minutes 6 seconds (1 mGy). COMPLICATIONS: None PROCEDURE: Informed written consent was obtained from the patient's family after a thorough discussion of the procedural risks, benefits and alternatives. All questions were addressed. Maximal Sterile Barrier Technique was utilized including caps, mask, sterile gowns, sterile gloves, sterile drape, hand hygiene and skin antiseptic. A timeout was  performed prior to the initiation of the procedure. Ultrasound survey of the left jugular region was performed with images stored and sent to PACs. The patient is prepped and draped in the usual sterile fashion. Skin and subcutaneous tissues were generously infiltrated 1% lidocaine for local anesthesia. Using ultrasound guidance, micropuncture set was used access the left internal jugular vein. Micro wire was passed through the needle into the right heart. Small incision was made in the needle was removed. Peel-away sheath was then placed, and a length of 19 cm was estimated. Triple-lumen 5 French catheter was then ligated at 19 cm and passed through the peel-away sheath. Peel-away sheath was removed and the catheter was sutured in position and flushed. Final image was stored. Patient tolerated the procedure well and remained hemodynamically stable throughout. No complications were encountered and no significant blood loss. IMPRESSION: Status post left IJ central catheter placement. Catheter ready for use. Signed, Dulcy Fanny. Earleen Newport, DO [facility] Radiology Electronically Signed   By: Corrie Mckusick D.O.   On: 02/23/2016 17:03   Dg Chest Port 1 View  Result Date: 03/12/2016 CLINICAL DATA:  Acute respiratory failure with hypoxia. Tracheostomy patient. EXAM: PORTABLE CHEST 1 VIEW COMPARISON:  Portable chest x-ray of June 05, 2016 and March 04, 2016. FINDINGS: The lungs remain mildly hypoinflated. Patchy density at the left lung base persists but there has been marked improvement since a study of March 04, 2016. The left hemidiaphragm is mildly elevated. The heart and pulmonary vascularity are normal. The tracheostomy appliance tip projects at the superior margin of the clavicular heads. The left internal jugular venous catheter tip projects over the midportion of the SVC. The bony thorax exhibits no acute abnormality. IMPRESSION: Further interval improvement in the  appearance of the left lower lobe pneumonia. Electronically Signed   By: David  Martinique M.D.   On: 03/12/2016 12:46  Dg Chest Port 1 View  Result Date: 03/07/2016 CLINICAL DATA:  Respiratory failure. EXAM: PORTABLE CHEST 1 VIEW COMPARISON:  03/04/2016. FINDINGS: Interim placement of tracheostomy tube. Left IJ line in stable position. Heart size stable. Low lung volumes with mild bibasilar infiltrates. Small left pleural effusion cannot be excluded. Elevation left hemidiaphragm again noted. IMPRESSION: 1. Tracheostomy tube in good anatomic position. Left IJ line in good anatomic position. 2. Low lung volumes. Persistent left base infiltrate. No change from prior exam. Electronically Signed   By: Marcello Moores  Register   On: 03/07/2016 16:09   Dg Chest Port 1 View  Result Date: 03/07/2016 CLINICAL DATA:  Intubated patient, acute respiratory failure, pneumonia EXAM: PORTABLE CHEST 1 VIEW COMPARISON:  Portable chest x-ray of March 04, 2016 FINDINGS: The lungs are borderline hypoinflated. The retrocardiac region on the left remains dense with obscuration of the mildly elevated hemidiaphragm. The heart is not enlarged. The pulmonary vascularity is not engorged. The endotracheal tube tip lies 5.6 cm above the carina. The left internal jugular venous catheter tip projects over the midportion of the SVC. IMPRESSION: Persistent left lower lobe atelectasis or pneumonia somewhat improved since the previous study. Probable small left pleural effusion. The support devices are in stable position. Electronically Signed   By: David  Martinique M.D.   On: 03/07/2016 06:49   Dg Chest Port 1 View  Result Date: 03/04/2016 CLINICAL DATA:  Acute respiratory failure. EXAM: PORTABLE CHEST 1 VIEW COMPARISON:  03/01/2016 FINDINGS: Endotracheal tube is in place with tip 4.1 cm above the carina. Left IJ central line tip to the superior vena cava. There is elevation of left hemidiaphragm. There is increasing opacity within the left lower lobe.  No pulmonary edema. Right lung remains clear. IMPRESSION: Increasing left lower lobe infiltrate. Stable elevation of left hemidiaphragm. Electronically Signed   By: Nolon Nations M.D.   On: 03/04/2016 07:25   Dg Chest Port 1 View  Result Date: 03/01/2016 CLINICAL DATA:  Hypoxia EXAM: PORTABLE CHEST 1 VIEW COMPARISON:  February 29, 2016 FINDINGS: Endotracheal tube tip is 3.2 cm above the carina. Central catheter tip is at the cavoatrial junction. No pneumothorax. There is patchy consolidation in the left base with small left pleural effusion. Right lung is clear. Heart is upper normal in size with pulmonary vascularity within normal limits. No adenopathy. No bone lesions. IMPRESSION: Tube and catheter positions as described without pneumothorax. Consolidation left base with small left pleural effusion. Right lung clear. Electronically Signed   By: Lowella Grip III M.D.   On: 03/01/2016 07:50   Dg Chest Port 1 View  Result Date: 02/29/2016 CLINICAL DATA:  Hypoxia EXAM: PORTABLE CHEST 1 VIEW COMPARISON:  February 28, 2016 FINDINGS: Endotracheal tube tip is 2.3 cm above the carina. Central catheter tip is at the cavoatrial junction. No pneumothorax. There is mild bibasilar atelectasis. Lungs elsewhere clear. Heart size and pulmonary vascularity are normal. No adenopathy. No bone lesions. IMPRESSION: Tube and catheter positions as described without pneumothorax. Bibasilar atelectasis. No frank edema or consolidation. Stable cardiac silhouette. Electronically Signed   By: Lowella Grip III M.D.   On: 02/29/2016 07:07   Dg Chest Port 1 View  Result Date: 02/28/2016 CLINICAL DATA:  Respiratory failure.  Shortness of breath. EXAM: PORTABLE CHEST 1 VIEW COMPARISON:  02/27/2016 . FINDINGS: Endotracheal tube in stable position. Low lung volumes with mild bibasilar atelectasis. Interim improvement from prior exam. Left base atelectasis/ infiltrate has cleared. Interim near complete resolution of small  bilateral pleural effusions. No  pneumothorax . IMPRESSION: 1. Lines and tubes in stable position. 2. Low lung volumes with mild bibasilar subsegmental atelectasis. Interim significant improvement from prior exam. Interim near complete resolution of small bilateral pleural effusions . Electronically Signed   By: Marcello Moores  Register   On: 02/28/2016 06:54   Dg Chest Port 1 View  Result Date: 02/27/2016 CLINICAL DATA:  Respiratory failure. EXAM: PORTABLE CHEST 1 VIEW COMPARISON:  02/26/2016. FINDINGS: Endotracheal tube, left IJ line in stable position. Heart size normal. Low lung volumes with basilar atelectasis. Developing left base infiltrate. Tiny pleural effusions cannot be excluded. No pneumothorax . IMPRESSION: 1. Lines and tubes in stable position. 2. Low lung volumes with basilar atelectasis. Developing left lower lobe infiltrate. Small bilateral pleural effusions cannot be excluded. Electronically Signed   By: Marcello Moores  Register   On: 02/27/2016 07:13   Dg Chest Port 1 View  Result Date: 02/26/2016 CLINICAL DATA:  Acute respiratory failure. EXAM: PORTABLE CHEST 1 VIEW COMPARISON:  02/25/2016 FINDINGS: Endotracheal tube has tip 3.6 cm above the carina. Left IJ central venous catheters unchanged with tip over the right atrium. Lungs are hypoinflated with linear atelectasis over the right midlung. Mild patchy opacification over the medial right upper lobe. Mild stable cardiomegaly. Remainder of the exam is unchanged. IMPRESSION: Mild opacification over the medial right upper lobe as cannot exclude residual infection. Minimal linear atelectasis right midlung. Tubes and lines as described. Electronically Signed   By: Marin Olp M.D.   On: 02/26/2016 07:28   Dg Chest Port 1 View  Result Date: 02/25/2016 CLINICAL DATA:  Acute respiratory failure.  Endotracheal tube. EXAM: PORTABLE CHEST 1 VIEW COMPARISON:  02/24/2016 FINDINGS: Endotracheal tube and left IJ central venous catheter are unchanged. Lungs are  hypoinflated with minimal right base opacification unchanged likely atelectasis. Subtle prominence of the perihilar markings. Cardiomediastinal silhouette and remainder of the exam is unchanged. IMPRESSION: Stable mild right base opacification likely atelectasis. Suggestion of minimal vascular congestion. Tubes and lines unchanged. Electronically Signed   By: Marin Olp M.D.   On: 02/25/2016 08:01   Dg Chest Port 1 View  Result Date: 02/24/2016 CLINICAL DATA:  Acute respiratory failure. EXAM: PORTABLE CHEST 1 VIEW COMPARISON:  02/23/2016 and chest CT 02/22/2016 FINDINGS: Endotracheal tube unchanged. Interval placement of left IJ central venous catheter which has tip at the cavoatrial junction. Patient slightly rotated to the right. Lungs are hypoinflated with mild stable bibasilar opacification which may be due to atelectasis or infection. Increased density in the right hilar region slightly worse. No pneumothorax. Cardiomediastinal silhouette and remainder of the exam is unchanged. IMPRESSION: Persistent bibasilar opacification with slight worsening right hilar opacification suggesting atelectasis or infection. Tubes and lines as described. Electronically Signed   By: Marin Olp M.D.   On: 02/24/2016 08:44   Dg Chest Port 1 View  Result Date: 02/23/2016 CLINICAL DATA:  Initial evaluation for acute respiratory failure. EXAM: PORTABLE CHEST 1 VIEW COMPARISON:  Prior radiograph from 02/22/2016. FINDINGS: Patient remains intubated with the tip of an endotracheal tube positioned 2.9 cm above the carina. Cardiac and mediastinal silhouettes are stable, and remain within normal limits. Lungs are hypoinflated. Patchy bibasilar opacities, right greater than left, again suspicious for possible multi lobar pneumonia. Overall, these are slightly more dense as compared to the prior exam. Underlying pulmonary interstitial edema is improved. Asymmetric prominence of the pulmonary vasculature within the right lung  favored to be related to patient rotation on this exam. No pneumothorax. No acute osseous abnormality. IMPRESSION: 1. Tip  of the endotracheal tube 2.9 cm above the carina. 2. Shallow lung inflation with persistent right greater than left bibasilar opacities, compatible with multi lobar pneumonia. Overall, these are slightly more dense in appearance as compared to previous exam. 3. Improved/resolved underlying pulmonary interstitial edema. Electronically Signed   By: Jeannine Boga M.D.   On: 02/23/2016 06:30   Dg Chest Port 1 View  Result Date: 02/22/2016 CLINICAL DATA:  Respiratory failure. EXAM: PORTABLE CHEST 1 VIEW COMPARISON:  CT chest 02/22/2016 FINDINGS: Endotracheal tube with the tip 4.5 cm above the carina. Patchy areas of airspace disease in the right upper and lower lobes. Patchy airspace disease in the left lower lobe. No pleural effusion or pneumothorax. Stable cardiomediastinal silhouette. IMPRESSION: 1. Endotracheal tube with the tip 4.5 cm above the carina. 2. Airspace disease in the right upper, right lower and left lower lobes most consistent with multi lobar pneumonia. Electronically Signed   By: Kathreen Devoid   On: 02/22/2016 16:03   Dg Chest Portable 1 View  Result Date: 02/22/2016 CLINICAL DATA:  46 year old female status post ET tube placement. Patient's septic. EXAM: PORTABLE CHEST 1 VIEW COMPARISON:  Chest radiograph dated 07/28/2015 FINDINGS: An endotracheal tube is seen with tip approximately 3 cm above the carina. An enteric tube extends into the left upper abdomen with tip and side-port likely in the proximal stomach. There is near complete consolidative changes of the right upper lobe with collapse. There is near complete consolidative changes of the right middle lobe. The lucent area within the right hemithorax likely represents and aerated portion of the lung. A pneumothorax is less likely as there appears to be lung markings, but not entirely excluded. Further  evaluation with CT is recommended. The left lung is clear.  The osseous structures are intact. IMPRESSION: Endotracheal tube above the carina. Large consolidative areas primarily in the right upper lobe and right middle lobe. A centrally obstructing mass or endobronchial lesion is not excluded. CT is recommended for further evaluation. Probable aerated portion of the right lower lobe versus less likely a pneumothorax. These results were called by telephone at the time of interpretation on 02/22/2016 at 6:45 am to Dr. Pryor Curia , who verbally acknowledged these results. Electronically Signed   By: Anner Crete M.D.   On: 02/22/2016 06:47     LOS: 5 days   Oren Binet, MD  [facility] Pager:[phone]  If 7PM-7AM, please contact night-coverage [email or web address] Password [email or web address] 03/19/2016, 10:13 AM

## 2016-03-19 NOTE — Progress Notes (Signed)
Notified Md about no labs on pt. Ordered for today. Pt is on tube feeding.  Will continue to monitor. [name] [name], [name]

## 2016-03-20 LAB — CBC
HCT: 27.4 % — ABNORMAL LOW (ref 36.0–46.0)
HEMOGLOBIN: 8.7 g/dL — AB (ref 12.0–15.0)
MCH: 26.6 pg (ref 26.0–34.0)
MCHC: 31.8 g/dL (ref 30.0–36.0)
MCV: 83.8 fL (ref 78.0–100.0)
PLATELETS: 289 10*3/uL (ref 150–400)
RBC: 3.27 MIL/uL — AB (ref 3.87–5.11)
RDW: 17.6 % — ABNORMAL HIGH (ref 11.5–15.5)
WBC: 10.7 10*3/uL — AB (ref 4.0–10.5)

## 2016-03-20 LAB — BASIC METABOLIC PANEL
ANION GAP: 9 (ref 5–15)
BUN: 16 mg/dL (ref 6–20)
CHLORIDE: 101 mmol/L (ref 101–111)
CO2: 28 mmol/L (ref 22–32)
Calcium: 9.1 mg/dL (ref 8.9–10.3)
Creatinine, Ser: 0.59 mg/dL (ref 0.44–1.00)
Glucose, Bld: 104 mg/dL — ABNORMAL HIGH (ref 65–99)
POTASSIUM: 3.8 mmol/L (ref 3.5–5.1)
SODIUM: 138 mmol/L (ref 135–145)

## 2016-03-20 LAB — GLUCOSE, CAPILLARY
GLUCOSE-CAPILLARY: 102 mg/dL — AB (ref 65–99)
GLUCOSE-CAPILLARY: 103 mg/dL — AB (ref 65–99)
GLUCOSE-CAPILLARY: 104 mg/dL — AB (ref 65–99)
GLUCOSE-CAPILLARY: 107 mg/dL — AB (ref 65–99)
GLUCOSE-CAPILLARY: 109 mg/dL — AB (ref 65–99)
GLUCOSE-CAPILLARY: 92 mg/dL (ref 65–99)
Glucose-Capillary: 92 mg/dL (ref 65–99)

## 2016-03-20 NOTE — Progress Notes (Signed)
Nutrition Follow-up  DOCUMENTATION CODES:   Not applicable  INTERVENTION:   Continue Vital AF 1.2 @ 55 ml/hr via PEG   Tube feeding regimen 1584 kcals, 99 grams protein, and 1071 ml free water daily (1871 ml with inclusion of free water flushes)  NUTRITION DIAGNOSIS:   Inadequate oral intake related to inability to eat as evidenced by NPO status.  Ongoing  GOAL:   Patient will meet greater than or equal to 90% of their needs  Met with TF  MONITOR:   Vent status, Labs, TF tolerance, I & O's  REASON FOR ASSESSMENT:   Consult Enteral/tube feeding initiation and management  ASSESSMENT:   46 year old with past medical history of frontotemporal dementia, restless leg syndrome. She is a nursing home resident. She was brought to the ED with altered mental status and respiratory distress. Required intubation in the ED.  Patient remains on ventilator support via trach.  MV: 6.7  L/min Temp (24hrs), Avg:98.8 F (37.1 C), Min:98.5 F (36.9 C), Max:99.2 F (37.3 C)  Vital AF 1.2 @ 55 ml/hr currently infusing via PEG, which provides 1584 kcals, 99 grams protein, and 1071 ml free water daily (1871 ml with inclusion of free water flushes), which meets 100% of estimated kcal and protein needs.  Per chart review, plan for pt to d/c to [facility] SNF on 03/21/16.   Labs reviewed: CBGS: 92-103.   Diet Order:  Diet NPO time specified  Skin:  Reviewed, no issues  Last BM:  1/11  Height:   Ht Readings from Last 1 Encounters:  02/22/16 '5\' 5"'  (3.832 m)    Weight:   Wt Readings from Last 1 Encounters:  03/20/16 148 lb 13 oz (67.5 kg)    Ideal Body Weight:  56.8 kg  BMI:  Body mass index is 24.76 kg/m.  Estimated Nutritional Needs:   Kcal:  1490.9  Protein:  90-105 gm  Fluid:  1.8 L  EDUCATION NEEDS:   No education needs identified at this time  [name] [name] A. Jimmye Norman, RD, LDN, CDE Pager: [phone] After hours Pager: [phone]

## 2016-03-20 NOTE — Progress Notes (Signed)
Pharmacy Antibiotic Note  [name] [name] is a 46 y.o. female continues on vancomycin and meropenem for MRSA and pseudomonal PNA and ESBL E coli and Proteus mirabilis UTI.  Renal function has been stable.  Planned antibiotic stop date of 1/20 in place.  Afebrile, WBC 13.3   Plan:  1) Continue vancomycin 1227m IV q24, check another trough in 1-2 days. 2) Continue meropenem 1g IV q8. 3) Stop antibiotics 1/20 as planned previously.  Height: _0  (165.1 cm) Weight: 148 lb 13 oz (67.5 kg) IBW/kg (Calculated) : 57  Temp (24hrs), Avg:98.6 F (37 C), Min:98.4 F (36.9 C), Max:99.1 F (37.3 C)   Recent Labs Lab 03/14/16 0400 03/16/16 0432 03/17/16 0316 03/17/16 1530 03/18/16 0355 03/19/16 0300 03/20/16 0729  WBC 14.6* 12.5* 13.9*  --   --  13.3*  --   CREATININE 0.67 0.60 0.67  --  0.67  --  0.59  VANCOTROUGH 14*  --   --  23*  --   --   --     Estimated Creatinine Clearance: 79.1 mL/min (by C-G formula based on SCr of 0.59 mg/dL).    Allergies  Allergen Reactions  . Sulfa Antibiotics Other (See Comments)    Per MAR    Antimicrobials this admission: Vancomycin 12/21>>12/24; 1/6>> Zosyn 12/21>>12/24 Azithromycin 12/21 >> 12/23 PO Vanc 12/21 >>  Cipro 12/25 >>12/30 Ceftriaxone 1/3 >> 1/5 Ceftazidime 1/5 >>1/6 Meropenem 1/6>>  Dose adjustments this admission: 1/11 VT = 14 on [id number], inc to [id number] 1/14 VT = 23 on 750m19m2, change to 1250mg17m  Microbiology results: 12/21BCx: 1/2 GPC clusters (BCID- CONS) 12/21UCx: multiple species, none predominant 12/21 BAL: >100K Pseudomonas; 20K MRSA 12/21 C. Diff Ag positive, toxin negative, PCR positive 12/21 Legionella UAg: ip 12/21 Strep UAg: negative 12/21 RVP: negative 12/21 GI Panel: + Norovirus 12/21 MRSA PCR negative 12/21 Influenza PCR negative 1/3 urine cx: Proteus, E.coli (ESBL) 1/2 blood cx: ngtd 1/4 BAL: 70K staph aureus, 50k pseudomonas  Thank you for asking pharmacy to be involved in the care  of this patient.  JessiUvaldo RisingS  Clinical Pharmacist Pager [phone] 8:59 AM

## 2016-03-20 NOTE — Progress Notes (Signed)
Pt is back on FS at this time tolerating it well.  

## 2016-03-20 NOTE — Progress Notes (Signed)
PROGRESS NOTE        PATIENT DETAILS Name: [name] [name] Age: 46 y.o. Sex: female Date of Birth: 1971/01/15 Admit Date: 02/22/2016 Admitting Physician Marshell Garfinkel, MD MCN:OBSJ, SAAD, MD  Brief Narrative: Patient is a 46 y.o. female with past medical history of frontotemporal dementia of an resident of a skilled nursing facility-apparently contracted and aphasic at baseline-brought to the hospital on 02/22/16 with acute respiratory failure from multilobar pneumonia. She was intubated on admission, and was difficult to wean and subsequently required a tracheostomy on 1/4. She is still ventilator dependent. Hospital course was complicated by development of multiple electrolyte abnormalities, and possible C. difficile colitis. See below for further details  Subjective: Unchanged-does not respond to commands-lying comfortably in bed. Per RN no major events overnight  Assessment/Plan: Acute hypoxemic respiratory failure: Secondary to pneumonia.Intubated and unable to wean, tracheostomy placed 1/. [facility] following for vent/trach care. Spoke with social work-needs vent-SNF at discharge, per social work has a bed at [facility] on 03/21/2016  Septic shock secondary to multilobar pneumonia, C Diff colitis, ESBL UTI: Sepsis pathophysiology has resolved. Mild leukocytosis persists-but patient is afebrile. Her blood cultures on 1/2 are negative, BAL culture on 1/4 positive for Pseudomonas and MRSA, urine culture positive for ESBL Escherichia coli and Proteus. Remains on empiric vancomycin and meropenem with stop date of 1/20. Furthermore, hospital course was complicated by diarrhea-thought to be due to a combination of normal virus and C. difficile colitis and remains on vancomycin (stop date 1/27-with treatment extended for 1 week following completion of tx for MRSA/Pseudomonas) through the PEG tube.Per RN stools getting more formed.  Anemia: Likely secondary to acute  illness, follow.  Chronic dysphagia: Has a PEG tube in place.  History of frontotemporal dementia: Reviewed H&P-contracted, not verbal and bedbound at discharge. Resident of SNF.  Palliative care/goals of care: Very poor prognoses-seen by palliative care during this hospital stay, family still desires full aggressive care in spite of poor overall prognoses.  DVT Prophylaxis: Prophylactic Lovenox   Code Status: Full code   Family Communication: Spoke to patient's sister Antwonette Feliz on 1/17  Disposition Plan: Remain inpatient-SNF on likely on 1/18  Antimicrobial agents: Anti-infectives    Start     Dose/Rate Route Frequency Ordered Stop   03/17/16 2300  vancomycin (VANCOCIN) 1,250 mg in sodium chloride 0.9 % 250 mL IVPB     1,250 mg 166.7 mL/hr over 90 Minutes Intravenous Every 24 hours 03/17/16 1711 03/23/16 2359   03/17/16 2200  vancomycin (VANCOCIN) 500 mg in sodium chloride 0.9 % 100 mL IVPB  Status:  Discontinued     500 mg 100 mL/hr over 60 Minutes Intravenous Every 12 hours 03/17/16 1658 03/17/16 1711   03/14/16 1600  vancomycin (VANCOCIN) IVPB 750 mg/150 ml premix  Status:  Discontinued     750 mg 150 mL/hr over 60 Minutes Intravenous Every 12 hours 03/14/16 0526 03/17/16 1658   03/10/16 1400  vancomycin (VANCOCIN) 50 mg/mL oral solution 125 mg     125 mg Oral 4 times daily 03/10/16 1230 03/30/16 2359   03/10/16 0400  vancomycin (VANCOCIN) 500 mg in sodium chloride 0.9 % 100 mL IVPB  Status:  Discontinued     500 mg 100 mL/hr over 60 Minutes Intravenous Every 12 hours 03/09/16 1524 03/14/16 0526   03/09/16 1600  meropenem (MERREM) 1 g in  sodium chloride 0.9 % 100 mL IVPB     1 g 200 mL/hr over 30 Minutes Intravenous Every 8 hours 03/09/16 1524 03/23/16 2359   03/09/16 1600  vancomycin (VANCOCIN) IVPB 1000 mg/200 mL premix     1,000 mg 200 mL/hr over 60 Minutes Intravenous  Once 03/09/16 1524 03/09/16 1748   03/08/16 1100  cefTAZidime (FORTAZ) 1 g in dextrose 5 % 50 mL  IVPB  Status:  Discontinued     1 g 100 mL/hr over 30 Minutes Intravenous Every 12 hours 03/08/16 0945 03/09/16 1511   03/07/16 1400  vancomycin (VANCOCIN) 50 mg/mL oral solution 125 mg  Status:  Discontinued     125 mg Oral 4 times daily 03/07/16 1043 03/09/16 1518   03/06/16 1300  cefTRIAXone (ROCEPHIN) 1 g in dextrose 5 % 50 mL IVPB  Status:  Discontinued     1 g 100 mL/hr over 30 Minutes Intravenous Every 24 hours 03/06/16 1158 03/08/16 0945   02/26/16 1700  vancomycin (VANCOCIN) IVPB 1000 mg/200 mL premix  Status:  Discontinued     1,000 mg 200 mL/hr over 60 Minutes Intravenous Every 8 hours 02/26/16 1026 02/26/16 1028   02/26/16 1030  ciprofloxacin (CIPRO) IVPB 400 mg  Status:  Discontinued     400 mg 200 mL/hr over 60 Minutes Intravenous Every 12 hours 02/26/16 1026 03/02/16 0949   02/25/16 1930  vancomycin (VANCOCIN) IVPB 1000 mg/200 mL premix  Status:  Discontinued     1,000 mg 200 mL/hr over 60 Minutes Intravenous Every 8 hours 02/25/16 1858 02/26/16 1017   02/24/16 1915  vancomycin (VANCOCIN) IVPB 1000 mg/200 mL premix  Status:  Discontinued     1,000 mg 200 mL/hr over 60 Minutes Intravenous Every 8 hours 02/24/16 1106 02/25/16 0927   02/24/16 1115  vancomycin (VANCOCIN) 1,250 mg in sodium chloride 0.9 % 250 mL IVPB     1,250 mg 166.7 mL/hr over 90 Minutes Intravenous  Once 02/24/16 1106 02/24/16 1430   02/22/16 1800  vancomycin (VANCOCIN) IVPB 750 mg/150 ml premix  Status:  Discontinued     750 mg 150 mL/hr over 60 Minutes Intravenous Every 8 hours 02/22/16 0949 02/24/16 1106   02/22/16 1430  vancomycin (VANCOCIN) 50 mg/mL oral solution 125 mg     125 mg Oral 4 times daily 02/22/16 1420 03/07/16 1359   02/22/16 1400  piperacillin-tazobactam (ZOSYN) IVPB 3.375 g  Status:  Discontinued     3.375 g 12.5 mL/hr over 240 Minutes Intravenous Every 8 hours 02/22/16 0949 02/26/16 0955   02/22/16 1030  vancomycin (VANCOCIN) 500 mg in sodium chloride 0.9 % 100 mL IVPB     500 mg 100  mL/hr over 60 Minutes Intravenous  Once 02/22/16 0949 02/22/16 1212   02/22/16 0900  azithromycin (ZITHROMAX) 500 mg in dextrose 5 % 250 mL IVPB  Status:  Discontinued     500 mg 250 mL/hr over 60 Minutes Intravenous Every 24 hours 02/22/16 0846 02/24/16 0859   02/22/16 0615  piperacillin-tazobactam (ZOSYN) IVPB 3.375 g     3.375 g 100 mL/hr over 30 Minutes Intravenous  Once 02/22/16 0601 02/22/16 0708   02/22/16 0615  vancomycin (VANCOCIN) IVPB 1000 mg/200 mL premix     1,000 mg 200 mL/hr over 60 Minutes Intravenous  Once 02/22/16 0601 02/22/16 0748      Procedures: ETT 12/21>>1/4 Trach 1/4 >> Left IJ 12/22 >> Foley >> PEG >> Rectal tube >>  CONSULTS: PCCM  Palliative Care  Time spent: 25- minutes-Greater  than 50% of this time was spent in counseling, explanation of diagnosis, planning of further management, and coordination of care.  MEDICATIONS: Scheduled Meds: . chlorhexidine gluconate (MEDLINE KIT)  15 mL Mouth Rinse BID  . enoxaparin (LOVENOX) injection  40 mg Subcutaneous Q24H  . famotidine  20 mg Per Tube BID  . free water  200 mL Per Tube Q6H  . furosemide  20 mg Intravenous Daily  . insulin aspart  2-6 Units Subcutaneous Q4H  . mouth rinse  15 mL Mouth Rinse QID  . meropenem (MERREM) IV  1 g Intravenous Q8H  . vancomycin  1,250 mg Intravenous Q24H  . vancomycin  125 mg Oral QID   Continuous Infusions: . feeding supplement (VITAL AF 1.2 CAL) 1,000 mL (03/20/16 0805)   PRN Meds:.sodium chloride, acetaminophen, albuterol, ondansetron (ZOFRAN) IV   PHYSICAL EXAM: Vital signs: Vitals:   03/20/16 0400 03/20/16 0736 03/20/16 0759 03/20/16 0800  BP: 93/75  93/67 91/63  Pulse: 96  88 88  Resp: 12  (!) 30 (!) 30  Temp:   98.5 F (36.9 C)   TempSrc:   Axillary   SpO2: 100% 100% 100% 100%  Weight:      Height:       Filed Weights   03/18/16 0345 03/19/16 0534 03/20/16 0331  Weight: 66.1 kg (145 lb 11.6 oz) 66.6 kg (146 lb 13.2 oz) 67.5 kg (148 lb 13 oz)    Body mass index is 24.76 kg/m.   General exam: Lethargic, does not respond to voice. Not following any commands Respiratory system: Vent-trach. Course breath sounds  Cardiovascular system: S1 & S2 heard, tachycardic, regular. No JVD, murmurs, rubs, gallops or clicks.  Gastrointestinal system: Abdomen is nondistended, soft and nontender.Peg tube in pace Central nervous system: does not respond to voice  Extremities: Symmetric, contracted at knees, b/l wrist drop Skin: No rashes, lesions or ulcers on exposed skin    I have personally reviewed following labs and imaging studies  LABORATORY DATA: CBC:  Recent Labs Lab 03/14/16 0400 03/16/16 0432 03/17/16 0316 03/19/16 0300 03/20/16 0729  WBC 14.6* 12.5* 13.9* 13.3* 10.7*  NEUTROABS 9.3*  --   --   --   --   HGB 8.8* 8.7* 9.3* 8.9* 8.7*  HCT 28.9* 28.1* 29.4* 29.2* 27.4*  MCV 85.0 84.4 84.5 84.6 83.8  PLT 381 347 338 304 941    Basic Metabolic Panel:  Recent Labs Lab 03/14/16 0400 03/16/16 0432 03/17/16 0316 03/18/16 0355 03/20/16 0729  NA 142 138 138 139 138  K 3.8 3.3* 3.4* 3.7 3.8  CL 103 99* 99* 101 101  CO2 _0 GLUCOSE 114* 108* 107* 106* 104*  BUN 26* 23* _1 CREATININE 0.67 0.60 0.67 0.67 0.59  CALCIUM 8.9 8.8* 8.8* 8.9 9.1    GFR: Estimated Creatinine Clearance: 79.1 mL/min (by C-G formula based on SCr of 0.59 mg/dL).  Liver Function Tests:  Recent Labs Lab 03/17/16 0316  AST 33  ALT 44  ALKPHOS 79  BILITOT 0.5  PROT 6.7  ALBUMIN 2.6*   No results for input(s): LIPASE, AMYLASE in the last 168 hours. No results for input(s): AMMONIA in the last 168 hours.  Coagulation Profile: No results for input(s): INR, PROTIME in the last 168 hours.  Cardiac Enzymes: No results for input(s): CKTOTAL, CKMB, CKMBINDEX, TROPONINI in the last 168 hours.  BNP (last 3 results) No results for input(s): PROBNP in the last 8760 hours.  HbA1C: No  results for input(s): HGBA1C in the last  72 hours.  CBG:  Recent Labs Lab 03/19/16 1605 03/19/16 2008 03/19/16 2343 03/20/16 0401 03/20/16 0758  GLUCAP 93 109* 92 92 102*    Lipid Profile: No results for input(s): CHOL, HDL, LDLCALC, TRIG, CHOLHDL, LDLDIRECT in the last 72 hours.  Thyroid Function Tests: No results for input(s): TSH, T4TOTAL, FREET4, T3FREE, THYROIDAB in the last 72 hours.  Anemia Panel: No results for input(s): VITAMINB12, FOLATE, FERRITIN, TIBC, IRON, RETICCTPCT in the last 72 hours.  Urine analysis:    Component Value Date/Time   COLORURINE YELLOW 03/05/2016 1725   APPEARANCEUR HAZY (A) 03/05/2016 1725   LABSPEC 1.019 03/05/2016 1725   PHURINE 5.0 03/05/2016 1725   GLUCOSEU NEGATIVE 03/05/2016 1725   HGBUR SMALL (A) 03/05/2016 1725   BILIRUBINUR NEGATIVE 03/05/2016 1725   KETONESUR NEGATIVE 03/05/2016 1725   PROTEINUR 100 (A) 03/05/2016 1725   UROBILINOGEN 1.0 10/17/2013 1957   NITRITE NEGATIVE 03/05/2016 1725   LEUKOCYTESUR LARGE (A) 03/05/2016 1725    Sepsis Labs: Lactic Acid, Venous    Component Value Date/Time   LATICACIDVEN 2.9 (HH) 02/22/2016 1539    MICROBIOLOGY: No results found for this or any previous visit (from the past 240 hour(s)).  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 Code sepsis. EXAM: CT CHEST WITH CONTRAST TECHNIQUE: Multidetector CT imaging of the chest was performed during intravenous contrast administration. CONTRAST:  75 cc ISOVUE-300 IOPAMIDOL (ISOVUE-300) INJECTION 61% COMPARISON:  Chest radiograph from earlier today. FINDINGS: Cardiovascular: Normal heart size. No significant pericardial fluid/thickening. Great vessels are normal in course and caliber. No central pulmonary emboli. Mediastinum/Nodes: No discrete thyroid nodules. Unremarkable esophagus. No pathologically enlarged axillary,  mediastinal or hilar lymph nodes. Lungs/Pleura: No pneumothorax. No pleural effusion. Endotracheal tube tip is 2.8 cm above the carina. There is complete consolidation and severe volume loss in the right upper lobe. The right upper lobe bronchus is occluded with low attenuation material favoring mucous plugging. No discrete obstructing mass. There is moderate patchy consolidation and ground-glass attenuation throughout the right middle lobe, lingula and bilateral lower lobes (right greater than left), with associated mild volume loss in the lower lobes. No significant pulmonary nodules in the limited aerated portions of the lungs. Upper abdomen: Enteric tube terminates in the gastric fundus. Musculoskeletal:  No aggressive appearing focal osseous lesions. IMPRESSION: 1. Well-positioned endotracheal and enteric tubes. 2. Complete right upper lobe consolidation and significant volume loss with occlusion of the right upper lobe bronchus by low-attenuation material, favoring mucous plugging. No discrete obstructing mass. 3. Moderate patchy consolidation and ground-glass attenuation throughout the right middle lobe, lingula and bilateral lower lobes, favoring multilobar pneumonia and/or aspiration. Electronically Signed   By: Ilona Sorrel M.D.   On: 02/22/2016 10:41   Ir Fluoro Guide Cv Line Left  Result Date: 02/23/2016 INDICATION: 46 year old female with multi lobar pneumonia. EXAM: IR LEFT FLOURO GUIDE CV LINE; IR ULTRASOUND GUIDANCE VASC ACCESS LEFT MEDICATIONS: None ANESTHESIA/SEDATION: None FLUOROSCOPY TIME:  Fluoroscopy Time: 0 minutes 6 seconds (1 mGy). COMPLICATIONS: None PROCEDURE: Informed written consent was obtained from the patient's family after a thorough discussion of the procedural risks, benefits and alternatives. All questions were addressed. Maximal Sterile Barrier Technique was utilized including caps, mask, sterile gowns, sterile gloves, sterile drape, hand hygiene and skin antiseptic. A  timeout was performed prior to the initiation of the procedure.  Ultrasound survey of the left jugular region was performed with images stored and sent to PACs. The patient is prepped and draped in the usual sterile fashion. Skin and subcutaneous tissues were generously infiltrated 1% lidocaine for local anesthesia. Using ultrasound guidance, micropuncture set was used access the left internal jugular vein. Micro wire was passed through the needle into the right heart. Small incision was made in the needle was removed. Peel-away sheath was then placed, and a length of 19 cm was estimated. Triple-lumen 5 French catheter was then ligated at 19 cm and passed through the peel-away sheath. Peel-away sheath was removed and the catheter was sutured in position and flushed. Final image was stored. Patient tolerated the procedure well and remained hemodynamically stable throughout. No complications were encountered and no significant blood loss. IMPRESSION: Status post left IJ central catheter placement. Catheter ready for use. Signed, Dulcy Fanny. Earleen Newport, DO [facility] Radiology Electronically Signed   By: Corrie Mckusick D.O.   On: 02/23/2016 17:03   Ir US Guide Vasc Access Left  Result Date: 02/23/2016 INDICATION: 46 year old female with multi lobar pneumonia. EXAM: IR LEFT FLOURO GUIDE CV LINE; IR ULTRASOUND GUIDANCE VASC ACCESS LEFT MEDICATIONS: None ANESTHESIA/SEDATION: None FLUOROSCOPY TIME:  Fluoroscopy Time: 0 minutes 6 seconds (1 mGy). COMPLICATIONS: None PROCEDURE: Informed written consent was obtained from the patient's family after a thorough discussion of the procedural risks, benefits and alternatives. All questions were addressed. Maximal Sterile Barrier Technique was utilized including caps, mask, sterile gowns, sterile gloves, sterile drape, hand hygiene and skin antiseptic. A timeout was performed prior to the initiation of the procedure. Ultrasound survey of  the left jugular region was performed with images stored and sent to PACs. The patient is prepped and draped in the usual sterile fashion. Skin and subcutaneous tissues were generously infiltrated 1% lidocaine for local anesthesia. Using ultrasound guidance, micropuncture set was used access the left internal jugular vein. Micro wire was passed through the needle into the right heart. Small incision was made in the needle was removed. Peel-away sheath was then placed, and a length of 19 cm was estimated. Triple-lumen 5 French catheter was then ligated at 19 cm and passed through the peel-away sheath. Peel-away sheath was removed and the catheter was sutured in position and flushed. Final image was stored. Patient tolerated the procedure well and remained hemodynamically stable throughout. No complications were encountered and no significant blood loss. IMPRESSION: Status post left IJ central catheter placement. Catheter ready for use. Signed, Dulcy Fanny. Earleen Newport, DO [facility] Radiology Electronically Signed   By: Corrie Mckusick D.O.   On: 02/23/2016 17:03   Dg Chest Port 1 View  Result Date: 03/12/2016 CLINICAL DATA:  Acute respiratory failure with hypoxia. Tracheostomy patient. EXAM: PORTABLE CHEST 1 VIEW COMPARISON:  Portable chest x-ray of June 05, 2016 and March 04, 2016. FINDINGS: The lungs remain mildly hypoinflated. Patchy density at the left lung base persists but there has been marked improvement since a study of March 04, 2016. The left hemidiaphragm is mildly elevated. The heart and pulmonary vascularity are normal. The tracheostomy appliance tip projects at the superior margin of the clavicular heads. The left internal jugular venous catheter tip projects over the midportion of the SVC. The bony thorax exhibits no acute abnormality. IMPRESSION: Further interval improvement in the appearance of the left lower lobe pneumonia. Electronically Signed   By:  David  Martinique M.D.   On: 03/12/2016 12:46   Dg  Chest Port 1 View  Result Date: 03/07/2016 CLINICAL DATA:  Respiratory failure. EXAM: PORTABLE CHEST 1 VIEW COMPARISON:  03/04/2016. FINDINGS: Interim placement of tracheostomy tube. Left IJ line in stable position. Heart size stable. Low lung volumes with mild bibasilar infiltrates. Small left pleural effusion cannot be excluded. Elevation left hemidiaphragm again noted. IMPRESSION: 1. Tracheostomy tube in good anatomic position. Left IJ line in good anatomic position. 2. Low lung volumes. Persistent left base infiltrate. No change from prior exam. Electronically Signed   By: Marcello Moores  Register   On: 03/07/2016 16:09   Dg Chest Port 1 View  Result Date: 03/07/2016 CLINICAL DATA:  Intubated patient, acute respiratory failure, pneumonia EXAM: PORTABLE CHEST 1 VIEW COMPARISON:  Portable chest x-ray of March 04, 2016 FINDINGS: The lungs are borderline hypoinflated. The retrocardiac region on the left remains dense with obscuration of the mildly elevated hemidiaphragm. The heart is not enlarged. The pulmonary vascularity is not engorged. The endotracheal tube tip lies 5.6 cm above the carina. The left internal jugular venous catheter tip projects over the midportion of the SVC. IMPRESSION: Persistent left lower lobe atelectasis or pneumonia somewhat improved since the previous study. Probable small left pleural effusion. The support devices are in stable position. Electronically Signed   By: David  Martinique M.D.   On: 03/07/2016 06:49   Dg Chest Port 1 View  Result Date: 03/04/2016 CLINICAL DATA:  Acute respiratory failure. EXAM: PORTABLE CHEST 1 VIEW COMPARISON:  03/01/2016 FINDINGS: Endotracheal tube is in place with tip 4.1 cm above the carina. Left IJ central line tip to the superior vena cava. There is elevation of left hemidiaphragm. There is increasing opacity within the left lower lobe. No pulmonary edema. Right lung remains clear. IMPRESSION: Increasing left  lower lobe infiltrate. Stable elevation of left hemidiaphragm. Electronically Signed   By: Nolon Nations M.D.   On: 03/04/2016 07:25   Dg Chest Port 1 View  Result Date: 03/01/2016 CLINICAL DATA:  Hypoxia EXAM: PORTABLE CHEST 1 VIEW COMPARISON:  February 29, 2016 FINDINGS: Endotracheal tube tip is 3.2 cm above the carina. Central catheter tip is at the cavoatrial junction. No pneumothorax. There is patchy consolidation in the left base with small left pleural effusion. Right lung is clear. Heart is upper normal in size with pulmonary vascularity within normal limits. No adenopathy. No bone lesions. IMPRESSION: Tube and catheter positions as described without pneumothorax. Consolidation left base with small left pleural effusion. Right lung clear. Electronically Signed   By: Lowella Grip III M.D.   On: 03/01/2016 07:50   Dg Chest Port 1 View  Result Date: 02/29/2016 CLINICAL DATA:  Hypoxia EXAM: PORTABLE CHEST 1 VIEW COMPARISON:  February 28, 2016 FINDINGS: Endotracheal tube tip is 2.3 cm above the carina. Central catheter tip is at the cavoatrial junction. No pneumothorax. There is mild bibasilar atelectasis. Lungs elsewhere clear. Heart size and pulmonary vascularity are normal. No adenopathy. No bone lesions. IMPRESSION: Tube and catheter positions as described without pneumothorax. Bibasilar atelectasis. No frank edema or consolidation. Stable cardiac silhouette. Electronically Signed   By: Lowella Grip III M.D.   On: 02/29/2016 07:07   Dg Chest Port 1 View  Result Date: 02/28/2016 CLINICAL DATA:  Respiratory failure.  Shortness of breath. EXAM: PORTABLE CHEST 1 VIEW COMPARISON:  02/27/2016 . FINDINGS: Endotracheal tube in stable position. Low lung volumes with mild bibasilar atelectasis. Interim improvement from prior exam. Left base atelectasis/ infiltrate has cleared. Interim near complete resolution of small bilateral pleural effusions. No pneumothorax .  IMPRESSION: 1. Lines and  tubes in stable position. 2. Low lung volumes with mild bibasilar subsegmental atelectasis. Interim significant improvement from prior exam. Interim near complete resolution of small bilateral pleural effusions . Electronically Signed   By: Marcello Moores  Register   On: 02/28/2016 06:54   Dg Chest Port 1 View  Result Date: 02/27/2016 CLINICAL DATA:  Respiratory failure. EXAM: PORTABLE CHEST 1 VIEW COMPARISON:  02/26/2016. FINDINGS: Endotracheal tube, left IJ line in stable position. Heart size normal. Low lung volumes with basilar atelectasis. Developing left base infiltrate. Tiny pleural effusions cannot be excluded. No pneumothorax . IMPRESSION: 1. Lines and tubes in stable position. 2. Low lung volumes with basilar atelectasis. Developing left lower lobe infiltrate. Small bilateral pleural effusions cannot be excluded. Electronically Signed   By: Marcello Moores  Register   On: 02/27/2016 07:13   Dg Chest Port 1 View  Result Date: 02/26/2016 CLINICAL DATA:  Acute respiratory failure. EXAM: PORTABLE CHEST 1 VIEW COMPARISON:  02/25/2016 FINDINGS: Endotracheal tube has tip 3.6 cm above the carina. Left IJ central venous catheters unchanged with tip over the right atrium. Lungs are hypoinflated with linear atelectasis over the right midlung. Mild patchy opacification over the medial right upper lobe. Mild stable cardiomegaly. Remainder of the exam is unchanged. IMPRESSION: Mild opacification over the medial right upper lobe as cannot exclude residual infection. Minimal linear atelectasis right midlung. Tubes and lines as described. Electronically Signed   By: Marin Olp M.D.   On: 02/26/2016 07:28   Dg Chest Port 1 View  Result Date: 02/25/2016 CLINICAL DATA:  Acute respiratory failure.  Endotracheal tube. EXAM: PORTABLE CHEST 1 VIEW COMPARISON:  02/24/2016 FINDINGS: Endotracheal tube and left IJ central venous catheter are unchanged. Lungs are hypoinflated with minimal right base opacification unchanged likely  atelectasis. Subtle prominence of the perihilar markings. Cardiomediastinal silhouette and remainder of the exam is unchanged. IMPRESSION: Stable mild right base opacification likely atelectasis. Suggestion of minimal vascular congestion. Tubes and lines unchanged. Electronically Signed   By: Marin Olp M.D.   On: 02/25/2016 08:01   Dg Chest Port 1 View  Result Date: 02/24/2016 CLINICAL DATA:  Acute respiratory failure. EXAM: PORTABLE CHEST 1 VIEW COMPARISON:  02/23/2016 and chest CT 02/22/2016 FINDINGS: Endotracheal tube unchanged. Interval placement of left IJ central venous catheter which has tip at the cavoatrial junction. Patient slightly rotated to the right. Lungs are hypoinflated with mild stable bibasilar opacification which may be due to atelectasis or infection. Increased density in the right hilar region slightly worse. No pneumothorax. Cardiomediastinal silhouette and remainder of the exam is unchanged. IMPRESSION: Persistent bibasilar opacification with slight worsening right hilar opacification suggesting atelectasis or infection. Tubes and lines as described. Electronically Signed   By: Marin Olp M.D.   On: 02/24/2016 08:44   Dg Chest Port 1 View  Result Date: 02/23/2016 CLINICAL DATA:  Initial evaluation for acute respiratory failure. EXAM: PORTABLE CHEST 1 VIEW COMPARISON:  Prior radiograph from 02/22/2016. FINDINGS: Patient remains intubated with the tip of an endotracheal tube positioned 2.9 cm above the carina. Cardiac and mediastinal silhouettes are stable, and remain within normal limits. Lungs are hypoinflated. Patchy bibasilar opacities, right greater than left, again suspicious for possible multi lobar pneumonia. Overall, these are slightly more dense as compared to the prior exam. Underlying pulmonary interstitial edema is improved. Asymmetric prominence of the pulmonary vasculature within the right lung favored to be related to patient rotation on this exam. No  pneumothorax. No acute osseous abnormality. IMPRESSION: 1. Tip of  the endotracheal tube 2.9 cm above the carina. 2. Shallow lung inflation with persistent right greater than left bibasilar opacities, compatible with multi lobar pneumonia. Overall, these are slightly more dense in appearance as compared to previous exam. 3. Improved/resolved underlying pulmonary interstitial edema. Electronically Signed   By: Jeannine Boga M.D.   On: 02/23/2016 06:30   Dg Chest Port 1 View  Result Date: 02/22/2016 CLINICAL DATA:  Respiratory failure. EXAM: PORTABLE CHEST 1 VIEW COMPARISON:  CT chest 02/22/2016 FINDINGS: Endotracheal tube with the tip 4.5 cm above the carina. Patchy areas of airspace disease in the right upper and lower lobes. Patchy airspace disease in the left lower lobe. No pleural effusion or pneumothorax. Stable cardiomediastinal silhouette. IMPRESSION: 1. Endotracheal tube with the tip 4.5 cm above the carina. 2. Airspace disease in the right upper, right lower and left lower lobes most consistent with multi lobar pneumonia. Electronically Signed   By: Kathreen Devoid   On: 02/22/2016 16:03   Dg Chest Portable 1 View  Result Date: 02/22/2016 CLINICAL DATA:  46 year old female status post ET tube placement. Patient's septic. EXAM: PORTABLE CHEST 1 VIEW COMPARISON:  Chest radiograph dated 07/28/2015 FINDINGS: An endotracheal tube is seen with tip approximately 3 cm above the carina. An enteric tube extends into the left upper abdomen with tip and side-port likely in the proximal stomach. There is near complete consolidative changes of the right upper lobe with collapse. There is near complete consolidative changes of the right middle lobe. The lucent area within the right hemithorax likely represents and aerated portion of the lung. A pneumothorax is less likely as there appears to be lung markings, but not entirely excluded. Further evaluation with CT is recommended. The left lung is clear.  The  osseous structures are intact. IMPRESSION: Endotracheal tube above the carina. Large consolidative areas primarily in the right upper lobe and right middle lobe. A centrally obstructing mass or endobronchial lesion is not excluded. CT is recommended for further evaluation. Probable aerated portion of the right lower lobe versus less likely a pneumothorax. These results were called by telephone at the time of interpretation on 02/22/2016 at 6:45 am to Dr. Pryor Curia , who verbally acknowledged these results. Electronically Signed   By: Anner Crete M.D.   On: 02/22/2016 06:47     LOS: 61 days   Oren Binet, MD  [facility] Pager:[phone]  If 7PM-7AM, please contact night-coverage [email or web address] Password [email or web address] 03/20/2016, 10:05 AM

## 2016-03-21 LAB — GLUCOSE, CAPILLARY
GLUCOSE-CAPILLARY: 98 mg/dL (ref 65–99)
GLUCOSE-CAPILLARY: 92 mg/dL (ref 65–99)
Glucose-Capillary: 107 mg/dL — ABNORMAL HIGH (ref 65–99)
Glucose-Capillary: 113 mg/dL — ABNORMAL HIGH (ref 65–99)
Glucose-Capillary: 126 mg/dL — ABNORMAL HIGH (ref 65–99)
Glucose-Capillary: 128 mg/dL — ABNORMAL HIGH (ref 65–99)
Glucose-Capillary: 99 mg/dL (ref 65–99)

## 2016-03-21 MED ORDER — ROPINIROLE HCL 0.25 MG PO TABS: 0.2500 mg | ORAL_TABLET | Freq: Every day | ORAL | 0 refills | Status: AC

## 2016-03-21 MED ORDER — ENOXAPARIN SODIUM 40 MG/0.4ML ~~LOC~~ SOLN: 40.0000 mg | SUBCUTANEOUS | 0 refills | Status: AC

## 2016-03-21 MED ORDER — FAMOTIDINE 40 MG/5ML PO SUSR: 20.0000 mg | Freq: Two times a day (BID) | ORAL | 0 refills | Status: AC

## 2016-03-21 MED ORDER — VANCOMYCIN HCL 10 G IV SOLR: 1250.0000 mg | INTRAVENOUS | 0 refills | Status: AC

## 2016-03-21 MED ORDER — VITAL AF 1.2 CAL PO LIQD: 1000.0000 mL | ORAL | 0 refills | Status: AC

## 2016-03-21 MED ORDER — ADULT MULTIVITAMIN LIQUID CH: 5.0000 mL | Freq: Every day | ORAL | 0 refills | Status: AC

## 2016-03-21 MED ORDER — CETIRIZINE HCL 10 MG PO TABS: 10.0000 mg | ORAL_TABLET | Freq: Every day | ORAL | 0 refills | Status: AC

## 2016-03-21 MED ORDER — GUAIFENESIN 100 MG/5ML PO SOLN: 400.0000 mg | Freq: Four times a day (QID) | ORAL | 0 refills | Status: AC | PRN

## 2016-03-21 MED ORDER — INSULIN ASPART 100 UNIT/ML ~~LOC~~ SOLN: SUBCUTANEOUS | 0 refills | Status: AC

## 2016-03-21 MED ORDER — CALCITONIN (SALMON) 200 UNIT/ACT NA SOLN: 1.0000 | Freq: Every day | NASAL | 0 refills | Status: AC

## 2016-03-21 MED ORDER — SODIUM CHLORIDE 0.9% FLUSH
10.0000 mL | Freq: Two times a day (BID) | INTRAVENOUS | Status: DC
  Administered 2016-03-21 – 2016-03-23 (×5): 10 mL

## 2016-03-21 MED ORDER — MAGNESIUM HYDROXIDE 400 MG/5ML PO SUSP: 15.0000 mL | ORAL | 0 refills | Status: AC

## 2016-03-21 MED ORDER — VANCOMYCIN 50 MG/ML ORAL SOLUTION
125.0000 mg | Freq: Four times a day (QID) | ORAL | 0 refills | Status: AC
Start: 2016-03-21 — End: ?

## 2016-03-21 MED ORDER — SODIUM CHLORIDE 0.9 % IV BOLUS (SEPSIS)
250.0000 mL | Freq: Once | INTRAVENOUS | Status: AC
  Administered 2016-03-21: 250 mL via INTRAVENOUS

## 2016-03-21 MED ORDER — MORPHINE SULFATE (CONCENTRATE) 20 MG/ML PO SOLN: 5.0000 mg | ORAL | 0 refills | Status: AC | PRN

## 2016-03-21 MED ORDER — BACLOFEN 10 MG PO TABS: 10.0000 mg | ORAL_TABLET | Freq: Three times a day (TID) | ORAL | 0 refills | Status: AC

## 2016-03-21 MED ORDER — FREE WATER: 200.0000 mL | Freq: Four times a day (QID) | 0 refills | Status: AC

## 2016-03-21 MED ORDER — VITAMIN B-12 1000 MCG PO TABS: 1000.0000 ug | ORAL_TABLET | Freq: Every day | ORAL | 0 refills | Status: AC

## 2016-03-21 MED ORDER — SODIUM CHLORIDE 0.9 % IV SOLN: 1.0000 g | Freq: Three times a day (TID) | INTRAVENOUS | 0 refills | Status: AC

## 2016-03-21 MED ORDER — VITAMIN D-3 125 MCG (5000 UT) PO TABS: 5000.0000 [IU] | ORAL_TABLET | ORAL | 0 refills | Status: AC

## 2016-03-21 MED ORDER — ALBUTEROL SULFATE (2.5 MG/3ML) 0.083% IN NEBU: 2.5000 mg | INHALATION_SOLUTION | RESPIRATORY_TRACT | 12 refills | Status: AC | PRN

## 2016-03-21 MED ORDER — SODIUM CHLORIDE 0.9% FLUSH: 10.0000 mL | INTRAVENOUS | Status: DC | PRN

## 2016-03-21 NOTE — Progress Notes (Signed)
Per [facility] transport, they are unable to transport patient today. Transport rescheduled for 12pm Friday. Patient's sister aware.  Osborne Cascoadia Amethyst Gainer LCSWA [phone]

## 2016-03-21 NOTE — Discharge Summary (Addendum)
PATIENT DETAILS Name: [name] [name] Age: 46 y.o. Sex: female Date of Birth: 1970-12-07 MRN: [id number]. Admitting Physician: Marshell Garfinkel, MD [id number], SAAD, MD  Admit Date: 02/22/2016 Discharge date: 03/24/2016  Recommendations for Outpatient Follow-up:  1. Suggest palliative care eval while at SNF.  2. Please obtain BMP/CBC in one week 3. Please see below regarding stop date of antibiotics 4. Consider removing PICC line once patient completes Antibiotic course  Admitted From:  SNF  Disposition: SNF   Home Health: No  Equipment/Devices: ETT 12/21>>1/4 Trach 1/4 >> Left IJ 12/22 >>1/21  Discharge Condition: Stable  CODE STATUS: FULL CODE  Diet recommendation:  NPO-on peg feeds  Brief Summary: See H&P, Labs, Consult and Test reports for all details in brief,Patient is a 46 y.o. female with past medical history of frontotemporal dementia of an resident of a skilled nursing facility-apparently contracted and aphasic at baseline-brought to the hospital on 02/22/16 with acute respiratory failure from multilobar pneumonia. She was intubated on admission, and was difficult to wean and subsequently required a tracheostomy on 1/4. She is still ventilator dependent. Hospital course was complicated by development of multiple electrolyte abnormalities, and possible C. difficile colitis. See below for further details.  Brief Hospital Course: Acute hypoxemic respiratory failure: Secondary to pneumonia.Intubated and unable to wean, tracheostomy placed 1/4. PCCM followed for vent/trach care. Being discharged to [facility] 03/24/2016  Septic shock secondary to multilobar pneumonia, C Diff colitis, ESBL UTI: Sepsis pathophysiology has resolved. Mild leukocytosis persists-but patient is afebrile. Her blood cultures on 1/2 are negative, BAL culture on 1/4 positive for Pseudomonas and MRSA, urine culture positive for ESBL Escherichia coli and Proteus. Remains on empiric IV  vancomycin and meropenem with stop date of 1/21. Furthermore, hospital course was complicated by diarrhea-thought to be due to a combination of norvo virus and C. difficile colitis and remains on ORAL vancomycin (stop date 1/27-with treatment extended for 1 week following completion of tx for MRSA/Pseudomonas) through the PEG tube. Per RN stools getting more formed.  Anemia: Likely secondary to acute illness, follow.  Chronic dysphagia: Has a PEG tube in place.  History of frontotemporal dementia: Reviewed H&P-contracted, not verbal and bedbound at discharge. Resident of SNF.  Palliative care/goals of care: Very poor prognoses-seen by palliative care during this hospital stay, family still desires full aggressive care in spite of poor overall prognoses.Possibly will be vent dependent-is bed bound/contracted and with peg tube.Suggest re-involvement by palliative care while at SNF-as patient clearly has poor quality of life.   Procedures/Studies: ETT 12/21>>1/4 Trach 1/4 >> Left IJ 12/22 >>  Discharge Diagnoses:  Active Problems:   PNA (pneumonia)   Acute respiratory failure (HCC)   Pressure injury of skin   Palliative care by specialist   Goals of care, counseling/discussion   Dementia   Endotracheally intubated   Discharge Instructions:  Activity:  As tolerated   Discharge Instructions    Discharge instructions    Complete by:  As directed    Follow with Primary MD at SNF  Check CBG every 4 hours  Please get a complete blood count and chemistry panel checked by your Primary MD at your next visit, and again as instructed by your Primary MD.  Get Medicines reviewed and adjusted: Please take all your medications with you for your next visit with your Primary MD  Laboratory/radiological data: Please request your Primary MD to go over all hospital tests and procedure/radiological results at the follow up, please ask your Primary MD to get  all Hospital records sent to  his/her office.  In some cases, they will be blood work, cultures and biopsy results pending at the time of your discharge. Please request that your primary care M.D. follows up on these results.  Also Note the following: If you experience worsening of your admission symptoms, develop shortness of breath, life threatening emergency, suicidal or homicidal thoughts you must seek medical attention immediately by calling 911 or calling your MD immediately  if symptoms less severe.  You must read complete instructions/literature along with all the possible adverse reactions/side effects for all the Medicines you take and that have been prescribed to you. Take any new Medicines after you have completely understood and accpet all the possible adverse reactions/side effects.   Do not drive when taking Pain medications or sleeping medications (Benzodaizepines)  Do not take more than prescribed Pain, Sleep and Anxiety Medications. It is not advisable to combine anxiety,sleep and pain medications without talking with your primary care practitioner  Special Instructions: If you have smoked or chewed Tobacco  in the last 2 yrs please stop smoking, stop any regular Alcohol  and or any Recreational drug use.  Wear Seat belts while driving.  Please note: You were cared for by a hospitalist during your hospital stay. Once you are discharged, your primary care physician will handle any further medical issues. Please note that NO REFILLS for any discharge medications will be authorized once you are discharged, as it is imperative that you return to your primary care physician (or establish a relationship with a primary care physician if you do not have one) for your post hospital discharge needs so that they can reassess your need for medications and monitor your lab values.   Increase activity slowly    Complete by:  As directed      Allergies as of 03/24/2016      Reactions   Sulfa Antibiotics Other (See  Comments)   Per MAR      Medication List    STOP taking these medications   aspirin EC 81 MG tablet   bisacodyl 10 MG suppository Commonly known as:  DULCOLAX   esomeprazole 40 MG packet Commonly known as:  NEXIUM   FLORASTOR PO   ipratropium-albuterol 0.5-2.5 (3) MG/3ML Soln Commonly known as:  DUONEB   meloxicam 7.5 MG tablet Commonly known as:  MOBIC   oxyCODONE-acetaminophen 5-325 MG tablet Commonly known as:  PERCOCET/ROXICET   polyethylene glycol packet Commonly known as:  MIRALAX / GLYCOLAX   PRESCRIPTION MEDICATION   sucralfate 1 GM/10ML suspension Commonly known as:  CARAFATE     TAKE these medications   acetaminophen 325 MG tablet Commonly known as:  TYLENOL Take 650 mg by mouth every 4 (four) hours as needed for mild pain or fever.   albuterol (2.5 MG/3ML) 0.083% nebulizer solution Commonly known as:  PROVENTIL Take 3 mLs (2.5 mg total) by nebulization every 2 (two) hours as needed for wheezing or shortness of breath. What changed:  when to take this  reasons to take this   baclofen 10 MG tablet Commonly known as:  LIORESAL Place 1 tablet (10 mg total) into feeding tube 3 (three) times daily. 9am, 2pm, 9pm What changed:  how to take this   calcitonin (salmon) 200 UNIT/ACT nasal spray Commonly known as:  MIACALCIN/FORTICAL Place 1 spray into alternate nostrils daily.   cetirizine 10 MG tablet Commonly known as:  ZYRTEC Place 1 tablet (10 mg total) into feeding tube daily. What changed:  how to take this   enoxaparin 40 MG/0.4ML injection Commonly known as:  LOVENOX Inject 0.4 mLs (40 mg total) into the skin daily.   famotidine 40 MG/5ML suspension Commonly known as:  PEPCID Place 2.5 mLs (20 mg total) into feeding tube 2 (two) times daily.   feeding supplement (VITAL AF 1.2 CAL) Liqd Place 1,000 mLs into feeding tube continuous. 55 mL/hr   free water Soln Place 200 mLs into feeding tube every 6 (six) hours. What changed:  how  much to take  how to take this  when to take this   guaiFENesin 100 MG/5ML Soln Commonly known as:  ROBITUSSIN Place 20 mLs (400 mg total) into feeding tube every 6 (six) hours as needed (congestion). What changed:  how to take this   insulin aspart 100 UNIT/ML injection Commonly known as:  novoLOG 2-6 Units, Subcutaneous, Every 4 hours, First dose on Thu 02/22/16 at 0900 Correction coverage: Standard Scale CBG < 70: implement Hypoglycemia Protocol CBG 121 - 150: 2 units CBG 151 - 200: 4 units CBG 201 - 250: 6 units CBG > 250 -or- 2 consecutive CBGs > 200: implement ICU Hyperglycemia Protocol   magnesium hydroxide 400 MG/5ML suspension Commonly known as:  MILK OF MAGNESIA Place 15 mLs into feeding tube every other day.   meropenem 1 g in sodium chloride 0.9 % 100 mL Inject 1 g into the vein every 8 (eight) hours. Last dose on 03/23/16   morphine 20 MG/ML concentrated solution Commonly known as:  ROXANOL Place 0.25 mLs (5 mg total) into feeding tube every 4 (four) hours as needed (pain/ shortness of breath/ air hunger). What changed:  when to take this   multivitamin Liqd Place 5 mLs into feeding tube daily. What changed:  how to take this   rOPINIRole 0.25 MG tablet Commonly known as:  REQUIP Place 1 tablet (0.25 mg total) into feeding tube daily at 6 PM. What changed:  how to take this   vancomycin 1,250 mg in sodium chloride 0.9 % 250 mL Inject 1,250 mg into the vein daily. Last dose on 03/23/16   vancomycin 50 mg/mL oral solution Commonly known as:  VANCOCIN Take 2.5 mLs (125 mg total) by mouth 4 (four) times daily. Stop date on 03/30/16   vitamin B-12 1000 MCG tablet Commonly known as:  CYANOCOBALAMIN Place 1 tablet (1,000 mcg total) into feeding tube daily. What changed:  how to take this   Vitamin D-3 5000 units Tabs 5,000 Units by PEG Tube route See admin instructions. On Monday and Saturday morning and evening      Follow-up Information    AMIN, SAAD, MD.  Schedule an appointment as soon as possible for a visit in 1 month(s).   Specialty:  Internal Medicine Contact information: [address] [place] [address] [phone]          Allergies  Allergen Reactions  . Sulfa Antibiotics Other (See Comments)    Per MAR   Consultations:   pulmonary/intensive care  Palliative care  Other Procedures/Studies: Ir Fluoro Guide Cv Line Left  Result Date: 02/23/2016 INDICATION: 46 year old female with multi lobar pneumonia. EXAM: IR LEFT FLOURO GUIDE CV LINE; IR ULTRASOUND GUIDANCE VASC ACCESS LEFT MEDICATIONS: None ANESTHESIA/SEDATION: None FLUOROSCOPY TIME:  Fluoroscopy Time: 0 minutes 6 seconds (1 mGy). COMPLICATIONS: None PROCEDURE: Informed written consent was obtained from the patient's family after a thorough discussion of the procedural risks, benefits and alternatives. All questions were addressed. Maximal Sterile Barrier Technique  was utilized including caps, mask, sterile gowns, sterile gloves, sterile drape, hand hygiene and skin antiseptic. A timeout was performed prior to the initiation of the procedure. Ultrasound survey of the left jugular region was performed with images stored and sent to PACs. The patient is prepped and draped in the usual sterile fashion. Skin and subcutaneous tissues were generously infiltrated 1% lidocaine for local anesthesia. Using ultrasound guidance, micropuncture set was used access the left internal jugular vein. Micro wire was passed through the needle into the right heart. Small incision was made in the needle was removed. Peel-away sheath was then placed, and a length of 19 cm was estimated. Triple-lumen 5 French catheter was then ligated at 19 cm and passed through the peel-away sheath. Peel-away sheath was removed and the catheter was sutured in position and flushed. Final image was stored. Patient tolerated the procedure well and remained hemodynamically stable throughout. No complications were  encountered and no significant blood loss. IMPRESSION: Status post left IJ central catheter placement. Catheter ready for use. Signed, Dulcy Fanny. Earleen Newport, DO [facility] Radiology Electronically Signed   By: Corrie Mckusick D.O.   On: 02/23/2016 17:03   Ir US Guide Vasc Access Left  Result Date: 02/23/2016 INDICATION: 46 year old female with multi lobar pneumonia. EXAM: IR LEFT FLOURO GUIDE CV LINE; IR ULTRASOUND GUIDANCE VASC ACCESS LEFT MEDICATIONS: None ANESTHESIA/SEDATION: None FLUOROSCOPY TIME:  Fluoroscopy Time: 0 minutes 6 seconds (1 mGy). COMPLICATIONS: None PROCEDURE: Informed written consent was obtained from the patient's family after a thorough discussion of the procedural risks, benefits and alternatives. All questions were addressed. Maximal Sterile Barrier Technique was utilized including caps, mask, sterile gowns, sterile gloves, sterile drape, hand hygiene and skin antiseptic. A timeout was performed prior to the initiation of the procedure. Ultrasound survey of the left jugular region was performed with images stored and sent to PACs. The patient is prepped and draped in the usual sterile fashion. Skin and subcutaneous tissues were generously infiltrated 1% lidocaine for local anesthesia. Using ultrasound guidance, micropuncture set was used access the left internal jugular vein. Micro wire was passed through the needle into the right heart. Small incision was made in the needle was removed. Peel-away sheath was then placed, and a length of 19 cm was estimated. Triple-lumen 5 French catheter was then ligated at 19 cm and passed through the peel-away sheath. Peel-away sheath was removed and the catheter was sutured in position and flushed. Final image was stored. Patient tolerated the procedure well and remained hemodynamically stable throughout. No complications were encountered and no significant blood loss. IMPRESSION: Status post left IJ central  catheter placement. Catheter ready for use. Signed, Dulcy Fanny. Earleen Newport, DO [facility] Radiology Electronically Signed   By: Corrie Mckusick D.O.   On: 02/23/2016 17:03   Dg Chest Port 1 View  Result Date: 03/12/2016 CLINICAL DATA:  Acute respiratory failure with hypoxia. Tracheostomy patient. EXAM: PORTABLE CHEST 1 VIEW COMPARISON:  Portable chest x-ray of June 05, 2016 and March 04, 2016. FINDINGS: The lungs remain mildly hypoinflated. Patchy density at the left lung base persists but there has been marked improvement since a study of March 04, 2016. The left hemidiaphragm is mildly elevated. The heart and pulmonary vascularity are normal. The tracheostomy appliance tip projects at the superior margin of the clavicular heads. The left internal jugular venous catheter tip projects over the midportion of the SVC. The bony thorax exhibits no acute abnormality. IMPRESSION: Further interval  improvement in the appearance of the left lower lobe pneumonia. Electronically Signed   By: David  Martinique M.D.   On: 03/12/2016 12:46   Dg Chest Port 1 View  Result Date: 03/07/2016 CLINICAL DATA:  Respiratory failure. EXAM: PORTABLE CHEST 1 VIEW COMPARISON:  03/04/2016. FINDINGS: Interim placement of tracheostomy tube. Left IJ line in stable position. Heart size stable. Low lung volumes with mild bibasilar infiltrates. Small left pleural effusion cannot be excluded. Elevation left hemidiaphragm again noted. IMPRESSION: 1. Tracheostomy tube in good anatomic position. Left IJ line in good anatomic position. 2. Low lung volumes. Persistent left base infiltrate. No change from prior exam. Electronically Signed   By: Marcello Moores  Register   On: 03/07/2016 16:09   Dg Chest Port 1 View  Result Date: 03/07/2016 CLINICAL DATA:  Intubated patient, acute respiratory failure, pneumonia EXAM: PORTABLE CHEST 1 VIEW COMPARISON:  Portable chest x-ray of March 04, 2016 FINDINGS: The lungs are  borderline hypoinflated. The retrocardiac region on the left remains dense with obscuration of the mildly elevated hemidiaphragm. The heart is not enlarged. The pulmonary vascularity is not engorged. The endotracheal tube tip lies 5.6 cm above the carina. The left internal jugular venous catheter tip projects over the midportion of the SVC. IMPRESSION: Persistent left lower lobe atelectasis or pneumonia somewhat improved since the previous study. Probable small left pleural effusion. The support devices are in stable position. Electronically Signed   By: David  Martinique M.D.   On: 03/07/2016 06:49   Dg Chest Port 1 View  Result Date: 03/04/2016 CLINICAL DATA:  Acute respiratory failure. EXAM: PORTABLE CHEST 1 VIEW COMPARISON:  03/01/2016 FINDINGS: Endotracheal tube is in place with tip 4.1 cm above the carina. Left IJ central line tip to the superior vena cava. There is elevation of left hemidiaphragm. There is increasing opacity within the left lower lobe. No pulmonary edema. Right lung remains clear. IMPRESSION: Increasing left lower lobe infiltrate. Stable elevation of left hemidiaphragm. Electronically Signed   By: Nolon Nations M.D.   On: 03/04/2016 07:25   Dg Chest Port 1 View  Result Date: 03/01/2016 CLINICAL DATA:  Hypoxia EXAM: PORTABLE CHEST 1 VIEW COMPARISON:  February 29, 2016 FINDINGS: Endotracheal tube tip is 3.2 cm above the carina. Central catheter tip is at the cavoatrial junction. No pneumothorax. There is patchy consolidation in the left base with small left pleural effusion. Right lung is clear. Heart is upper normal in size with pulmonary vascularity within normal limits. No adenopathy. No bone lesions. IMPRESSION: Tube and catheter positions as described without pneumothorax. Consolidation left base with small left pleural effusion. Right lung clear. Electronically Signed   By: Lowella Grip III M.D.   On: 03/01/2016 07:50   Dg Chest Port 1 View  Result Date:  02/29/2016 CLINICAL DATA:  Hypoxia EXAM: PORTABLE CHEST 1 VIEW COMPARISON:  February 28, 2016 FINDINGS: Endotracheal tube tip is 2.3 cm above the carina. Central catheter tip is at the cavoatrial junction. No pneumothorax. There is mild bibasilar atelectasis. Lungs elsewhere clear. Heart size and pulmonary vascularity are normal. No adenopathy. No bone lesions. IMPRESSION: Tube and catheter positions as described without pneumothorax. Bibasilar atelectasis. No frank edema or consolidation. Stable cardiac silhouette. Electronically Signed   By: Lowella Grip III M.D.   On: 02/29/2016 07:07   Dg Chest Port 1 View  Result Date: 02/28/2016 CLINICAL DATA:  Respiratory failure.  Shortness of breath. EXAM: PORTABLE CHEST 1 VIEW COMPARISON:  02/27/2016 . FINDINGS: Endotracheal tube in stable position. Low lung  volumes with mild bibasilar atelectasis. Interim improvement from prior exam. Left base atelectasis/ infiltrate has cleared. Interim near complete resolution of small bilateral pleural effusions. No pneumothorax . IMPRESSION: 1. Lines and tubes in stable position. 2. Low lung volumes with mild bibasilar subsegmental atelectasis. Interim significant improvement from prior exam. Interim near complete resolution of small bilateral pleural effusions . Electronically Signed   By: Marcello Moores  Register   On: 02/28/2016 06:54   Dg Chest Port 1 View  Result Date: 02/27/2016 CLINICAL DATA:  Respiratory failure. EXAM: PORTABLE CHEST 1 VIEW COMPARISON:  02/26/2016. FINDINGS: Endotracheal tube, left IJ line in stable position. Heart size normal. Low lung volumes with basilar atelectasis. Developing left base infiltrate. Tiny pleural effusions cannot be excluded. No pneumothorax . IMPRESSION: 1. Lines and tubes in stable position. 2. Low lung volumes with basilar atelectasis. Developing left lower lobe infiltrate. Small bilateral pleural effusions cannot be excluded. Electronically Signed   By: Marcello Moores  Register   On:  02/27/2016 07:13   Dg Chest Port 1 View  Result Date: 02/26/2016 CLINICAL DATA:  Acute respiratory failure. EXAM: PORTABLE CHEST 1 VIEW COMPARISON:  02/25/2016 FINDINGS: Endotracheal tube has tip 3.6 cm above the carina. Left IJ central venous catheters unchanged with tip over the right atrium. Lungs are hypoinflated with linear atelectasis over the right midlung. Mild patchy opacification over the medial right upper lobe. Mild stable cardiomegaly. Remainder of the exam is unchanged. IMPRESSION: Mild opacification over the medial right upper lobe as cannot exclude residual infection. Minimal linear atelectasis right midlung. Tubes and lines as described. Electronically Signed   By: Marin Olp M.D.   On: 02/26/2016 07:28   Dg Chest Port 1 View  Result Date: 02/25/2016 CLINICAL DATA:  Acute respiratory failure.  Endotracheal tube. EXAM: PORTABLE CHEST 1 VIEW COMPARISON:  02/24/2016 FINDINGS: Endotracheal tube and left IJ central venous catheter are unchanged. Lungs are hypoinflated with minimal right base opacification unchanged likely atelectasis. Subtle prominence of the perihilar markings. Cardiomediastinal silhouette and remainder of the exam is unchanged. IMPRESSION: Stable mild right base opacification likely atelectasis. Suggestion of minimal vascular congestion. Tubes and lines unchanged. Electronically Signed   By: Marin Olp M.D.   On: 02/25/2016 08:01   Dg Chest Port 1 View  Result Date: 02/24/2016 CLINICAL DATA:  Acute respiratory failure. EXAM: PORTABLE CHEST 1 VIEW COMPARISON:  02/23/2016 and chest CT 02/22/2016 FINDINGS: Endotracheal tube unchanged. Interval placement of left IJ central venous catheter which has tip at the cavoatrial junction. Patient slightly rotated to the right. Lungs are hypoinflated with mild stable bibasilar opacification which may be due to atelectasis or infection. Increased density in the right hilar region slightly worse. No pneumothorax. Cardiomediastinal  silhouette and remainder of the exam is unchanged. IMPRESSION: Persistent bibasilar opacification with slight worsening right hilar opacification suggesting atelectasis or infection. Tubes and lines as described. Electronically Signed   By: Marin Olp M.D.   On: 02/24/2016 08:44     TODAY-DAY OF DISCHARGE:  Subjective:   [name] [name] today remains unchanged. She is bedbound-contracted-remains on the vent. Does not follow commands.   Objective:   Blood pressure 99/74, pulse 88, temperature 98.6 F (37 C), temperature source Oral, resp. rate 15, height _0  (1.651 m), weight 67.5 kg (148 lb 13 oz), SpO2 100 %.  Intake/Output Summary (Last 24 hours) at 03/24/16 1553 Last data filed at 03/24/16 1400  Gross per 24 hour  Intake             2110  ml  Output             2000 ml  Net              110 ml   Filed Weights   03/22/16 0415 03/23/16 0315 03/24/16 0334  Weight: 67 kg (147 lb 11.3 oz) 60.3 kg (132 lb 15 oz) 67.5 kg (148 lb 13 oz)    Exam: General exam:Not in any distress, does not respond to voice. Not following any commands Respiratory system: Vent-trach. Course breath sounds  Cardiovascular system:S1 &S2 heard, tachycardic, regular. No JVD, murmurs, rubs, gallops or clicks.  Gastrointestinal system:Abdomen is nondistended, soft and nontender.Peg tube in pace Central nervous system:does not respond to voice  Extremities: Symmetric, contracted at knees, b/l wrist drop Skin: No rashes, lesions or ulcers on exposed skin    PERTINENT RADIOLOGIC STUDIES: Ir Fluoro Guide Cv Line Left  Result Date: 02/23/2016 INDICATION: 46 year old female with multi lobar pneumonia. EXAM: IR LEFT FLOURO GUIDE CV LINE; IR ULTRASOUND GUIDANCE VASC ACCESS LEFT MEDICATIONS: None ANESTHESIA/SEDATION: None FLUOROSCOPY TIME:  Fluoroscopy Time: 0 minutes 6 seconds (1 mGy). COMPLICATIONS: None PROCEDURE: Informed written consent was obtained from the patient's family after a thorough discussion  of the procedural risks, benefits and alternatives. All questions were addressed. Maximal Sterile Barrier Technique was utilized including caps, mask, sterile gowns, sterile gloves, sterile drape, hand hygiene and skin antiseptic. A timeout was performed prior to the initiation of the procedure. Ultrasound survey of the left jugular region was performed with images stored and sent to PACs. The patient is prepped and draped in the usual sterile fashion. Skin and subcutaneous tissues were generously infiltrated 1% lidocaine for local anesthesia. Using ultrasound guidance, micropuncture set was used access the left internal jugular vein. Micro wire was passed through the needle into the right heart. Small incision was made in the needle was removed. Peel-away sheath was then placed, and a length of 19 cm was estimated. Triple-lumen 5 French catheter was then ligated at 19 cm and passed through the peel-away sheath. Peel-away sheath was removed and the catheter was sutured in position and flushed. Final image was stored. Patient tolerated the procedure well and remained hemodynamically stable throughout. No complications were encountered and no significant blood loss. IMPRESSION: Status post left IJ central catheter placement. Catheter ready for use. Signed, Dulcy Fanny. Earleen Newport, DO [facility] Radiology Electronically Signed   By: Corrie Mckusick D.O.   On: 02/23/2016 17:03   Ir US Guide Vasc Access Left  Result Date: 02/23/2016 INDICATION: 46 year old female with multi lobar pneumonia. EXAM: IR LEFT FLOURO GUIDE CV LINE; IR ULTRASOUND GUIDANCE VASC ACCESS LEFT MEDICATIONS: None ANESTHESIA/SEDATION: None FLUOROSCOPY TIME:  Fluoroscopy Time: 0 minutes 6 seconds (1 mGy). COMPLICATIONS: None PROCEDURE: Informed written consent was obtained from the patient's family after a thorough discussion of the procedural risks, benefits and alternatives. All questions were addressed.  Maximal Sterile Barrier Technique was utilized including caps, mask, sterile gowns, sterile gloves, sterile drape, hand hygiene and skin antiseptic. A timeout was performed prior to the initiation of the procedure. Ultrasound survey of the left jugular region was performed with images stored and sent to PACs. The patient is prepped and draped in the usual sterile fashion. Skin and subcutaneous tissues were generously infiltrated 1% lidocaine for local anesthesia. Using ultrasound guidance, micropuncture set was used access the left internal jugular vein. Micro wire was passed through the needle into the right heart. Small incision was made in the  needle was removed. Peel-away sheath was then placed, and a length of 19 cm was estimated. Triple-lumen 5 French catheter was then ligated at 19 cm and passed through the peel-away sheath. Peel-away sheath was removed and the catheter was sutured in position and flushed. Final image was stored. Patient tolerated the procedure well and remained hemodynamically stable throughout. No complications were encountered and no significant blood loss. IMPRESSION: Status post left IJ central catheter placement. Catheter ready for use. Signed, Dulcy Fanny. Earleen Newport, DO [facility] Radiology Electronically Signed   By: Corrie Mckusick D.O.   On: 02/23/2016 17:03   Dg Chest Port 1 View  Result Date: 03/12/2016 CLINICAL DATA:  Acute respiratory failure with hypoxia. Tracheostomy patient. EXAM: PORTABLE CHEST 1 VIEW COMPARISON:  Portable chest x-ray of June 05, 2016 and March 04, 2016. FINDINGS: The lungs remain mildly hypoinflated. Patchy density at the left lung base persists but there has been marked improvement since a study of March 04, 2016. The left hemidiaphragm is mildly elevated. The heart and pulmonary vascularity are normal. The tracheostomy appliance tip projects at the superior margin of the clavicular heads. The left internal  jugular venous catheter tip projects over the midportion of the SVC. The bony thorax exhibits no acute abnormality. IMPRESSION: Further interval improvement in the appearance of the left lower lobe pneumonia. Electronically Signed   By: David  Martinique M.D.   On: 03/12/2016 12:46   Dg Chest Port 1 View  Result Date: 03/07/2016 CLINICAL DATA:  Respiratory failure. EXAM: PORTABLE CHEST 1 VIEW COMPARISON:  03/04/2016. FINDINGS: Interim placement of tracheostomy tube. Left IJ line in stable position. Heart size stable. Low lung volumes with mild bibasilar infiltrates. Small left pleural effusion cannot be excluded. Elevation left hemidiaphragm again noted. IMPRESSION: 1. Tracheostomy tube in good anatomic position. Left IJ line in good anatomic position. 2. Low lung volumes. Persistent left base infiltrate. No change from prior exam. Electronically Signed   By: Marcello Moores  Register   On: 03/07/2016 16:09   Dg Chest Port 1 View  Result Date: 03/07/2016 CLINICAL DATA:  Intubated patient, acute respiratory failure, pneumonia EXAM: PORTABLE CHEST 1 VIEW COMPARISON:  Portable chest x-ray of March 04, 2016 FINDINGS: The lungs are borderline hypoinflated. The retrocardiac region on the left remains dense with obscuration of the mildly elevated hemidiaphragm. The heart is not enlarged. The pulmonary vascularity is not engorged. The endotracheal tube tip lies 5.6 cm above the carina. The left internal jugular venous catheter tip projects over the midportion of the SVC. IMPRESSION: Persistent left lower lobe atelectasis or pneumonia somewhat improved since the previous study. Probable small left pleural effusion. The support devices are in stable position. Electronically Signed   By: David  Martinique M.D.   On: 03/07/2016 06:49   Dg Chest Port 1 View  Result Date: 03/04/2016 CLINICAL DATA:  Acute respiratory failure. EXAM: PORTABLE CHEST 1 VIEW COMPARISON:  03/01/2016 FINDINGS: Endotracheal tube is in place with tip 4.1 cm above  the carina. Left IJ central line tip to the superior vena cava. There is elevation of left hemidiaphragm. There is increasing opacity within the left lower lobe. No pulmonary edema. Right lung remains clear. IMPRESSION: Increasing left lower lobe infiltrate. Stable elevation of left hemidiaphragm. Electronically Signed   By: Nolon Nations M.D.   On: 03/04/2016 07:25   Dg Chest Port 1 View  Result Date: 03/01/2016 CLINICAL DATA:  Hypoxia EXAM: PORTABLE CHEST 1 VIEW COMPARISON:  February 29, 2016 FINDINGS: Endotracheal tube  tip is 3.2 cm above the carina. Central catheter tip is at the cavoatrial junction. No pneumothorax. There is patchy consolidation in the left base with small left pleural effusion. Right lung is clear. Heart is upper normal in size with pulmonary vascularity within normal limits. No adenopathy. No bone lesions. IMPRESSION: Tube and catheter positions as described without pneumothorax. Consolidation left base with small left pleural effusion. Right lung clear. Electronically Signed   By: Lowella Grip III M.D.   On: 03/01/2016 07:50   Dg Chest Port 1 View  Result Date: 02/29/2016 CLINICAL DATA:  Hypoxia EXAM: PORTABLE CHEST 1 VIEW COMPARISON:  February 28, 2016 FINDINGS: Endotracheal tube tip is 2.3 cm above the carina. Central catheter tip is at the cavoatrial junction. No pneumothorax. There is mild bibasilar atelectasis. Lungs elsewhere clear. Heart size and pulmonary vascularity are normal. No adenopathy. No bone lesions. IMPRESSION: Tube and catheter positions as described without pneumothorax. Bibasilar atelectasis. No frank edema or consolidation. Stable cardiac silhouette. Electronically Signed   By: Lowella Grip III M.D.   On: 02/29/2016 07:07   Dg Chest Port 1 View  Result Date: 02/28/2016 CLINICAL DATA:  Respiratory failure.  Shortness of breath. EXAM: PORTABLE CHEST 1 VIEW COMPARISON:  02/27/2016 . FINDINGS: Endotracheal tube in stable position. Low lung  volumes with mild bibasilar atelectasis. Interim improvement from prior exam. Left base atelectasis/ infiltrate has cleared. Interim near complete resolution of small bilateral pleural effusions. No pneumothorax . IMPRESSION: 1. Lines and tubes in stable position. 2. Low lung volumes with mild bibasilar subsegmental atelectasis. Interim significant improvement from prior exam. Interim near complete resolution of small bilateral pleural effusions . Electronically Signed   By: Marcello Moores  Register   On: 02/28/2016 06:54   Dg Chest Port 1 View  Result Date: 02/27/2016 CLINICAL DATA:  Respiratory failure. EXAM: PORTABLE CHEST 1 VIEW COMPARISON:  02/26/2016. FINDINGS: Endotracheal tube, left IJ line in stable position. Heart size normal. Low lung volumes with basilar atelectasis. Developing left base infiltrate. Tiny pleural effusions cannot be excluded. No pneumothorax . IMPRESSION: 1. Lines and tubes in stable position. 2. Low lung volumes with basilar atelectasis. Developing left lower lobe infiltrate. Small bilateral pleural effusions cannot be excluded. Electronically Signed   By: Marcello Moores  Register   On: 02/27/2016 07:13   Dg Chest Port 1 View  Result Date: 02/26/2016 CLINICAL DATA:  Acute respiratory failure. EXAM: PORTABLE CHEST 1 VIEW COMPARISON:  02/25/2016 FINDINGS: Endotracheal tube has tip 3.6 cm above the carina. Left IJ central venous catheters unchanged with tip over the right atrium. Lungs are hypoinflated with linear atelectasis over the right midlung. Mild patchy opacification over the medial right upper lobe. Mild stable cardiomegaly. Remainder of the exam is unchanged. IMPRESSION: Mild opacification over the medial right upper lobe as cannot exclude residual infection. Minimal linear atelectasis right midlung. Tubes and lines as described. Electronically Signed   By: Marin Olp M.D.   On: 02/26/2016 07:28   Dg Chest Port 1 View  Result Date: 02/25/2016 CLINICAL DATA:  Acute respiratory  failure.  Endotracheal tube. EXAM: PORTABLE CHEST 1 VIEW COMPARISON:  02/24/2016 FINDINGS: Endotracheal tube and left IJ central venous catheter are unchanged. Lungs are hypoinflated with minimal right base opacification unchanged likely atelectasis. Subtle prominence of the perihilar markings. Cardiomediastinal silhouette and remainder of the exam is unchanged. IMPRESSION: Stable mild right base opacification likely atelectasis. Suggestion of minimal vascular congestion. Tubes and lines unchanged. Electronically Signed   By: Marin Olp M.D.   On:  02/25/2016 08:01   Dg Chest Port 1 View  Result Date: 02/24/2016 CLINICAL DATA:  Acute respiratory failure. EXAM: PORTABLE CHEST 1 VIEW COMPARISON:  02/23/2016 and chest CT 02/22/2016 FINDINGS: Endotracheal tube unchanged. Interval placement of left IJ central venous catheter which has tip at the cavoatrial junction. Patient slightly rotated to the right. Lungs are hypoinflated with mild stable bibasilar opacification which may be due to atelectasis or infection. Increased density in the right hilar region slightly worse. No pneumothorax. Cardiomediastinal silhouette and remainder of the exam is unchanged. IMPRESSION: Persistent bibasilar opacification with slight worsening right hilar opacification suggesting atelectasis or infection. Tubes and lines as described. Electronically Signed   By: Marin Olp M.D.   On: 02/24/2016 08:44     PERTINENT LAB RESULTS: CBC: No results for input(s): WBC, HGB, HCT, PLT in the last 72 hours. CMET CMP     Component Value Date/Time   NA 138 03/20/2016 0729   K 3.8 03/20/2016 0729   CL 101 03/20/2016 0729   CO2 28 03/20/2016 0729   GLUCOSE 104 (H) 03/20/2016 0729   BUN 16 03/20/2016 0729   CREATININE 0.59 03/20/2016 0729   CALCIUM 9.1 03/20/2016 0729   PROT 6.7 03/17/2016 0316   ALBUMIN 2.6 (L) 03/17/2016 0316   AST 33 03/17/2016 0316   ALT 44 03/17/2016 0316   ALKPHOS 79 03/17/2016 0316   BILITOT 0.5  03/17/2016 0316   GFRNONAA >60 03/20/2016 0729   GFRAA >60 03/20/2016 0729    GFR Estimated Creatinine Clearance: 79.1 mL/min (by C-G formula based on SCr of 0.59 mg/dL). No results for input(s): LIPASE, AMYLASE in the last 72 hours. No results for input(s): CKTOTAL, CKMB, CKMBINDEX, TROPONINI in the last 72 hours. Invalid input(s): POCBNP No results for input(s): DDIMER in the last 72 hours. No results for input(s): HGBA1C in the last 72 hours. No results for input(s): CHOL, HDL, LDLCALC, TRIG, CHOLHDL, LDLDIRECT in the last 72 hours. No results for input(s): TSH, T4TOTAL, T3FREE, THYROIDAB in the last 72 hours.  Invalid input(s): FREET3 No results for input(s): VITAMINB12, FOLATE, FERRITIN, TIBC, IRON, RETICCTPCT in the last 72 hours. Coags: No results for input(s): INR in the last 72 hours.  Invalid input(s): PT Microbiology: No results found for this or any previous visit (from the past 240 hour(s)).  FURTHER DISCHARGE INSTRUCTIONS:  Get Medicines reviewed and adjusted: Please take all your medications with you for your next visit with your Primary MD  Laboratory/radiological data: Please request your Primary MD to go over all hospital tests and procedure/radiological results at the follow up, please ask your Primary MD to get all Hospital records sent to his/her office.  In some cases, they will be blood work, cultures and biopsy results pending at the time of your discharge. Please request that your primary care M.D. goes through all the records of your hospital data and follows up on these results.  Also Note the following: If you experience worsening of your admission symptoms, develop shortness of breath, life threatening emergency, suicidal or homicidal thoughts you must seek medical attention immediately by calling 911 or calling your MD immediately  if symptoms less severe.  You must read complete instructions/literature along with all the possible adverse  reactions/side effects for all the Medicines you take and that have been prescribed to you. Take any new Medicines after you have completely understood and accpet all the possible adverse reactions/side effects.   Do not drive when taking Pain medications or sleeping medications (Benzodaizepines)  Do not take more than prescribed Pain, Sleep and Anxiety Medications. It is not advisable to combine anxiety,sleep and pain medications without talking with your primary care practitioner  Special Instructions: If you have smoked or chewed Tobacco  in the last 2 yrs please stop smoking, stop any regular Alcohol  and or any Recreational drug use.  Wear Seat belts while driving.  Please note: You were cared for by a hospitalist during your hospital stay. Once you are discharged, your primary care physician will handle any further medical issues. Please note that NO REFILLS for any discharge medications will be authorized once you are discharged, as it is imperative that you return to your primary care physician (or establish a relationship with a primary care physician if you do not have one) for your post hospital discharge needs so that they can reassess your need for medications and monitor your lab values.  Total Time spent coordinating discharge including counseling, education and face to face time equals 45 minutes.  SignedThurnell Lose 03/24/2016 3:53 PM

## 2016-03-22 LAB — GLUCOSE, CAPILLARY
GLUCOSE-CAPILLARY: 112 mg/dL — AB (ref 65–99)
GLUCOSE-CAPILLARY: 108 mg/dL — AB (ref 65–99)
GLUCOSE-CAPILLARY: 94 mg/dL (ref 65–99)
Glucose-Capillary: 104 mg/dL — ABNORMAL HIGH (ref 65–99)
Glucose-Capillary: 115 mg/dL — ABNORMAL HIGH (ref 65–99)
Glucose-Capillary: 88 mg/dL (ref 65–99)

## 2016-03-22 NOTE — Progress Notes (Signed)
Patient will DC to: [facility] Anticipated DC date: 03/22/16 Family notified: Sister Transport by: [facility] 12pm   Per MD patient ready for DC to [facility]. RN, patient, patient's family, and facility notified of DC. Discharge Summary sent to facility. RN given number for report. DC packet on chart. Ambulance transport requested for patient.   CSW signing off.  Cristobal GoldmannNadia Aris Even, [place] Clinical Social Worker [phone]

## 2016-03-22 NOTE — Care Management Important Message (Signed)
Important Message  Patient Details  Name: [name] Dykstra MRN: [id number] Date of Birth: 04/09/1970   Medicare Important Message Given:  Yes    Hanley HaysDowell, Maelin Kurkowski T, RN 03/22/2016, 9:12 AM

## 2016-03-22 NOTE — Progress Notes (Addendum)
12:30pm: Transport has been set up for Sunday at 11am to [facility].    11:30am- [facility] alerted CSW that they will not accept patient with a central line. CSW cancelled transport and alerted patient's sister. Patient can discharge after antibiotics finish tomorrow and central line is removed. Next available transport is Saturday night or Sunday morning. Awaiting response from facility to confirm discharge time.   Osborne Cascoadia Caitlin Hillmer LCSWA [phone]

## 2016-03-22 NOTE — Progress Notes (Signed)
Seen and examined Remains unchanged See discharge summary on yesterday for further details Okay to discharge today

## 2016-03-23 LAB — GLUCOSE, CAPILLARY
GLUCOSE-CAPILLARY: 114 mg/dL — AB (ref 65–99)
GLUCOSE-CAPILLARY: 108 mg/dL — AB (ref 65–99)
Glucose-Capillary: 98 mg/dL (ref 65–99)
Glucose-Capillary: 96 mg/dL (ref 65–99)
Glucose-Capillary: 87 mg/dL (ref 65–99)
Glucose-Capillary: 111 mg/dL — ABNORMAL HIGH (ref 65–99)

## 2016-03-23 MED ORDER — SODIUM CHLORIDE 0.9 % IV SOLN
1.0000 g | Freq: Three times a day (TID) | INTRAVENOUS | Status: AC
  Administered 2016-03-23 – 2016-03-24 (×4): 1 g via INTRAVENOUS
  Filled 2016-03-23 (×4): qty 1

## 2016-03-23 NOTE — Progress Notes (Signed)
PROGRESS NOTE  Patient has been discharged on 03/21/2016, we await transportation on 03/24/2016       PATIENT DETAILS Name: [name] [name] Age: 46 y.o. Sex: female Date of Birth: 12/01/1970 Admit Date: 02/22/2016 Admitting Physician Marshell Garfinkel, MD XBD:ZHGD, SAAD, MD  Brief Narrative: Patient is a 46 y.o. female with past medical history of frontotemporal dementia of an resident of a skilled nursing facility-apparently contracted and aphasic at baseline-brought to the hospital on 02/22/16 with acute respiratory failure from multilobar pneumonia. She was intubated on admission, and was difficult to wean and subsequently required a tracheostomy on 1/4. She is still ventilator dependent. Hospital course was complicated by development of multiple electrolyte abnormalities, and possible C. difficile colitis. See below for further details  Subjective:  Patient in bed, remains unresponsive, does not respond to commands-lying comfortably in bed. Per RN no major events overnight  Assessment/Plan:  Acute hypoxemic respiratory failure: Secondary to pneumonia.Intubated and unable to wean, tracheostomy placed 1/. [facility] M following for vent/trach care. Spoke with social work-needs vent-SNF at discharge, per social work has a bed at [facility] at [place] on 03/21/2016, she will be transported on 03/24/2016 once IV antibiotic course is finished.  Septic shock secondary to multilobar pneumonia, C Diff colitis, ESBL UTI: Sepsis pathophysiology has resolved. Mild leukocytosis persists-but patient is afebrile. Her blood cultures on 1/2 are negative, BAL culture on 1/4 positive for Pseudomonas and MRSA, urine culture positive for ESBL Escherichia coli and Proteus. Remains on empiric vancomycin and meropenem with stop date of 1/21. Furthermore, hospital course was complicated by diarrhea-thought to be due to a combination of normal virus and C. difficile colitis and remains on vancomycin  (stop date 1/27-with treatment extended for 1 week following completion of tx for MRSA/Pseudomonas) through the PEG tube.Per RN stools getting more formed.  Anemia: Likely secondary to acute illness, follow.  Chronic dysphagia: Has a PEG tube in place.  History of frontotemporal dementia: Reviewed H&P-contracted, not verbal and bedbound at discharge. Resident of SNF.  Palliative care/goals of care: Very poor prognoses-seen by palliative care during this hospital stay, family still desires full aggressive care in spite of poor overall prognoses.   DVT Prophylaxis: Prophylactic Lovenox   Code Status: Full code   Family Communication: Spoke to patient's sister Keriana Sarsfield on 1/17  Disposition Plan: Remain inpatient-SNF on likely on 1/18  Antimicrobial agents: Anti-infectives    Start     Dose/Rate Route Frequency Ordered Stop   03/23/16 0800  meropenem (MERREM) 1 g in sodium chloride 0.9 % 100 mL IVPB     1 g 200 mL/hr over 30 Minutes Intravenous Every 8 hours 03/23/16 0630 03/24/16 1559   03/21/16 0000  meropenem 1 g in sodium chloride 0.9 % 100 mL     1 g 200 mL/hr over 30 Minutes Intravenous Every 8 hours 03/21/16 0907     03/21/16 0000  vancomycin 1,250 mg in sodium chloride 0.9 % 250 mL     1,250 mg 166.7 mL/hr over 90 Minutes Intravenous Every 24 hours 03/21/16 0907     03/21/16 0000  vancomycin (VANCOCIN) 50 mg/mL oral solution     125 mg Oral 4 times daily 03/21/16 0907     03/17/16 2300  vancomycin (VANCOCIN) 1,250 mg in sodium chloride 0.9 % 250 mL IVPB     1,250 mg 166.7 mL/hr over 90  Minutes Intravenous Every 24 hours 03/17/16 1711 03/23/16 2359   03/17/16 2200  vancomycin (VANCOCIN) 500 mg in sodium chloride 0.9 % 100 mL IVPB  Status:  Discontinued     500 mg 100 mL/hr over 60 Minutes Intravenous Every 12 hours 03/17/16 1658 03/17/16 1711   03/14/16 1600  vancomycin (VANCOCIN) IVPB 750 mg/150 ml premix  Status:  Discontinued     750 mg 150 mL/hr over 60 Minutes  Intravenous Every 12 hours 03/14/16 0526 03/17/16 1658   03/10/16 1400  vancomycin (VANCOCIN) 50 mg/mL oral solution 125 mg     125 mg Oral 4 times daily 03/10/16 1230 03/30/16 2359   03/10/16 0400  vancomycin (VANCOCIN) 500 mg in sodium chloride 0.9 % 100 mL IVPB  Status:  Discontinued     500 mg 100 mL/hr over 60 Minutes Intravenous Every 12 hours 03/09/16 1524 03/14/16 0526   03/09/16 1600  meropenem (MERREM) 1 g in sodium chloride 0.9 % 100 mL IVPB  Status:  Discontinued     1 g 200 mL/hr over 30 Minutes Intravenous Every 8 hours 03/09/16 1524 03/23/16 0630   03/09/16 1600  vancomycin (VANCOCIN) IVPB 1000 mg/200 mL premix     1,000 mg 200 mL/hr over 60 Minutes Intravenous  Once 03/09/16 1524 03/09/16 1748   03/08/16 1100  cefTAZidime (FORTAZ) 1 g in dextrose 5 % 50 mL IVPB  Status:  Discontinued     1 g 100 mL/hr over 30 Minutes Intravenous Every 12 hours 03/08/16 0945 03/09/16 1511   03/07/16 1400  vancomycin (VANCOCIN) 50 mg/mL oral solution 125 mg  Status:  Discontinued     125 mg Oral 4 times daily 03/07/16 1043 03/09/16 1518   03/06/16 1300  cefTRIAXone (ROCEPHIN) 1 g in dextrose 5 % 50 mL IVPB  Status:  Discontinued     1 g 100 mL/hr over 30 Minutes Intravenous Every 24 hours 03/06/16 1158 03/08/16 0945   02/26/16 1700  vancomycin (VANCOCIN) IVPB 1000 mg/200 mL premix  Status:  Discontinued     1,000 mg 200 mL/hr over 60 Minutes Intravenous Every 8 hours 02/26/16 1026 02/26/16 1028   02/26/16 1030  ciprofloxacin (CIPRO) IVPB 400 mg  Status:  Discontinued     400 mg 200 mL/hr over 60 Minutes Intravenous Every 12 hours 02/26/16 1026 03/02/16 0949   02/25/16 1930  vancomycin (VANCOCIN) IVPB 1000 mg/200 mL premix  Status:  Discontinued     1,000 mg 200 mL/hr over 60 Minutes Intravenous Every 8 hours 02/25/16 1858 02/26/16 1017   02/24/16 1915  vancomycin (VANCOCIN) IVPB 1000 mg/200 mL premix  Status:  Discontinued     1,000 mg 200 mL/hr over 60 Minutes Intravenous Every 8 hours  02/24/16 1106 02/25/16 0927   02/24/16 1115  vancomycin (VANCOCIN) 1,250 mg in sodium chloride 0.9 % 250 mL IVPB     1,250 mg 166.7 mL/hr over 90 Minutes Intravenous  Once 02/24/16 1106 02/24/16 1430   02/22/16 1800  vancomycin (VANCOCIN) IVPB 750 mg/150 ml premix  Status:  Discontinued     750 mg 150 mL/hr over 60 Minutes Intravenous Every 8 hours 02/22/16 0949 02/24/16 1106   02/22/16 1430  vancomycin (VANCOCIN) 50 mg/mL oral solution 125 mg     125 mg Oral 4 times daily 02/22/16 1420 03/07/16 1359   02/22/16 1400  piperacillin-tazobactam (ZOSYN) IVPB 3.375 g  Status:  Discontinued     3.375 g 12.5 mL/hr over 240 Minutes Intravenous Every 8 hours 02/22/16 0949 02/26/16 0955  02/22/16 1030  vancomycin (VANCOCIN) 500 mg in sodium chloride 0.9 % 100 mL IVPB     500 mg 100 mL/hr over 60 Minutes Intravenous  Once 02/22/16 0949 02/22/16 1212   02/22/16 0900  azithromycin (ZITHROMAX) 500 mg in dextrose 5 % 250 mL IVPB  Status:  Discontinued     500 mg 250 mL/hr over 60 Minutes Intravenous Every 24 hours 02/22/16 0846 02/24/16 0859   02/22/16 0615  piperacillin-tazobactam (ZOSYN) IVPB 3.375 g     3.375 g 100 mL/hr over 30 Minutes Intravenous  Once 02/22/16 0601 02/22/16 0708   02/22/16 0615  vancomycin (VANCOCIN) IVPB 1000 mg/200 mL premix     1,000 mg 200 mL/hr over 60 Minutes Intravenous  Once 02/22/16 0601 02/22/16 0748      Procedures: ETT 12/21>>1/4 Trach 1/4 >> Left IJ 12/22 >> Foley >> PEG >> Rectal tube >>  CONSULTS: PCCM  Palliative Care  Time spent: 25- minutes-Greater than 50% of this time was spent in counseling, explanation of diagnosis, planning of further management, and coordination of care.  MEDICATIONS: Scheduled Meds: . chlorhexidine gluconate (MEDLINE KIT)  15 mL Mouth Rinse BID  . enoxaparin (LOVENOX) injection  40 mg Subcutaneous Q24H  . famotidine  20 mg Per Tube BID  . free water  200 mL Per Tube Q6H  . furosemide  20 mg Intravenous Daily  . insulin  aspart  2-6 Units Subcutaneous Q4H  . mouth rinse  15 mL Mouth Rinse QID  . meropenem (MERREM) IV  1 g Intravenous Q8H  . sodium chloride flush  10-40 mL Intracatheter Q12H  . vancomycin  1,250 mg Intravenous Q24H  . vancomycin  125 mg Oral QID   Continuous Infusions: . feeding supplement (VITAL AF 1.2 CAL) 1,000 mL (03/22/16 1900)   PRN Meds:.sodium chloride, acetaminophen, albuterol, ondansetron (ZOFRAN) IV, sodium chloride flush   PHYSICAL EXAM: Vital signs: Vitals:   03/23/16 0500 03/23/16 0600 03/23/16 0801 03/23/16 0929  BP: (!) 83/57 (!) _0  Pulse: 84 84  91  Resp: _1 Temp:   97.7 F (36.5 C)   TempSrc:   Oral   SpO2: 100% 100%  100%  Weight:      Height:       Filed Weights   03/21/16 0301 03/22/16 0415 03/23/16 0315  Weight: 68.5 kg (151 lb 0.2 oz) 67 kg (147 lb 11.3 oz) 60.3 kg (132 lb 15 oz)   Body mass index is 22.12 kg/m.   General exam: Lethargic, does not respond to voice. Not following any commands Respiratory system: Vent-trach. Course breath sounds  Cardiovascular system: S1 & S2 heard, tachycardic, regular. No JVD, murmurs, rubs, gallops or clicks.  Gastrointestinal system: Abdomen is nondistended, soft and nontender.Peg tube in pace Central nervous system: does not respond to voice  Extremities: Symmetric, contracted at knees, b/l wrist drop Skin: No rashes, lesions or ulcers on exposed skin    I have personally reviewed following labs and imaging studies  LABORATORY DATA: CBC:  Recent Labs Lab 03/17/16 0316 03/19/16 0300 03/20/16 0729  WBC 13.9* 13.3* 10.7*  HGB 9.3* 8.9* 8.7*  HCT 29.4* 29.2* 27.4*  MCV 84.5 84.6 83.8  PLT 338 304 010    Basic Metabolic Panel:  Recent Labs Lab 03/17/16 0316 03/18/16 0355 03/20/16 0729  NA 138 139 138  K 3.4* 3.7 3.8  CL 99* 101 101  CO2 _2 GLUCOSE 107* 106* 104*  BUN 20 20 16  CREATININE 0.67 0.67 0.59  CALCIUM 8.8* 8.9 9.1    GFR: Estimated Creatinine  Clearance: 79.1 mL/min (by C-G formula based on SCr of 0.59 mg/dL).  Liver Function Tests:  Recent Labs Lab 03/17/16 0316  AST 33  ALT 44  ALKPHOS 79  BILITOT 0.5  PROT 6.7  ALBUMIN 2.6*   No results for input(s): LIPASE, AMYLASE in the last 168 hours. No results for input(s): AMMONIA in the last 168 hours.  Coagulation Profile: No results for input(s): INR, PROTIME in the last 168 hours.  Cardiac Enzymes: No results for input(s): CKTOTAL, CKMB, CKMBINDEX, TROPONINI in the last 168 hours.  BNP (last 3 results) No results for input(s): PROBNP in the last 8760 hours.  HbA1C: No results for input(s): HGBA1C in the last 72 hours.  CBG:  Recent Labs Lab 03/22/16 1612 03/22/16 2014 03/22/16 2356 03/23/16 0354 03/23/16 0759  GLUCAP 104* 115* 88 98 114*    Lipid Profile: No results for input(s): CHOL, HDL, LDLCALC, TRIG, CHOLHDL, LDLDIRECT in the last 72 hours.  Thyroid Function Tests: No results for input(s): TSH, T4TOTAL, FREET4, T3FREE, THYROIDAB in the last 72 hours.  Anemia Panel: No results for input(s): VITAMINB12, FOLATE, FERRITIN, TIBC, IRON, RETICCTPCT in the last 72 hours.  Urine analysis:    Component Value Date/Time   COLORURINE YELLOW 03/05/2016 1725   APPEARANCEUR HAZY (A) 03/05/2016 1725   LABSPEC 1.019 03/05/2016 1725   PHURINE 5.0 03/05/2016 1725   GLUCOSEU NEGATIVE 03/05/2016 1725   HGBUR SMALL (A) 03/05/2016 1725   BILIRUBINUR NEGATIVE 03/05/2016 1725   KETONESUR NEGATIVE 03/05/2016 1725   PROTEINUR 100 (A) 03/05/2016 1725   UROBILINOGEN 1.0 10/17/2013 1957   NITRITE NEGATIVE 03/05/2016 1725   LEUKOCYTESUR LARGE (A) 03/05/2016 1725    Sepsis Labs: Lactic Acid, Venous    Component Value Date/Time   LATICACIDVEN 2.9 (HH) 02/22/2016 1539    MICROBIOLOGY: No results found for this or any previous visit (from the past 240 hour(s)).  RADIOLOGY STUDIES/RESULTS: Ir Cyndy Freeze Guide Cv Line Left  Result Date: 02/23/2016 INDICATION:  46 year old female with multi lobar pneumonia. EXAM: IR LEFT FLOURO GUIDE CV LINE; IR ULTRASOUND GUIDANCE VASC ACCESS LEFT MEDICATIONS: None ANESTHESIA/SEDATION: None FLUOROSCOPY TIME:  Fluoroscopy Time: 0 minutes 6 seconds (1 mGy). COMPLICATIONS: None PROCEDURE: Informed written consent was obtained from the patient's family after a thorough discussion of the procedural risks, benefits and alternatives. All questions were addressed. Maximal Sterile Barrier Technique was utilized including caps, mask, sterile gowns, sterile gloves, sterile drape, hand hygiene and skin antiseptic. A timeout was performed prior to the initiation of the procedure. Ultrasound survey of the left jugular region was performed with images stored and sent to PACs. The patient is prepped and draped in the usual sterile fashion. Skin and subcutaneous tissues were generously infiltrated 1% lidocaine for local anesthesia. Using ultrasound guidance, micropuncture set was used access the left internal jugular vein. Micro wire was passed through the needle into the right heart. Small incision was made in the needle was removed. Peel-away sheath was then placed, and a length of 19 cm was estimated. Triple-lumen 5 French catheter was then ligated at 19 cm and passed through the peel-away sheath. Peel-away sheath was removed and the catheter was sutured in position and flushed. Final image was stored. Patient tolerated the procedure well and remained hemodynamically stable throughout. No complications were encountered and no significant blood loss. IMPRESSION: Status post left IJ central catheter placement. Catheter ready for use. Signed, Dulcy Fanny. Earleen Newport, DO  [facility] Radiology Electronically Signed   By: Corrie Mckusick D.O.   On: 02/23/2016 17:03   Ir US Guide Vasc Access Left  Result Date: 02/23/2016 INDICATION: 46 year old female with multi lobar pneumonia. EXAM: IR LEFT FLOURO GUIDE CV LINE;  IR ULTRASOUND GUIDANCE VASC ACCESS LEFT MEDICATIONS: None ANESTHESIA/SEDATION: None FLUOROSCOPY TIME:  Fluoroscopy Time: 0 minutes 6 seconds (1 mGy). COMPLICATIONS: None PROCEDURE: Informed written consent was obtained from the patient's family after a thorough discussion of the procedural risks, benefits and alternatives. All questions were addressed. Maximal Sterile Barrier Technique was utilized including caps, mask, sterile gowns, sterile gloves, sterile drape, hand hygiene and skin antiseptic. A timeout was performed prior to the initiation of the procedure. Ultrasound survey of the left jugular region was performed with images stored and sent to PACs. The patient is prepped and draped in the usual sterile fashion. Skin and subcutaneous tissues were generously infiltrated 1% lidocaine for local anesthesia. Using ultrasound guidance, micropuncture set was used access the left internal jugular vein. Micro wire was passed through the needle into the right heart. Small incision was made in the needle was removed. Peel-away sheath was then placed, and a length of 19 cm was estimated. Triple-lumen 5 French catheter was then ligated at 19 cm and passed through the peel-away sheath. Peel-away sheath was removed and the catheter was sutured in position and flushed. Final image was stored. Patient tolerated the procedure well and remained hemodynamically stable throughout. No complications were encountered and no significant blood loss. IMPRESSION: Status post left IJ central catheter placement. Catheter ready for use. Signed, Dulcy Fanny. Earleen Newport, DO [facility] Radiology Electronically Signed   By: Corrie Mckusick D.O.   On: 02/23/2016 17:03   Dg Chest Port 1 View  Result Date: 03/12/2016 CLINICAL DATA:  Acute respiratory failure with hypoxia. Tracheostomy patient. EXAM: PORTABLE CHEST 1 VIEW COMPARISON:  Portable chest x-ray of June 05, 2016 and March 04, 2016. FINDINGS:  The lungs remain mildly hypoinflated. Patchy density at the left lung base persists but there has been marked improvement since a study of March 04, 2016. The left hemidiaphragm is mildly elevated. The heart and pulmonary vascularity are normal. The tracheostomy appliance tip projects at the superior margin of the clavicular heads. The left internal jugular venous catheter tip projects over the midportion of the SVC. The bony thorax exhibits no acute abnormality. IMPRESSION: Further interval improvement in the appearance of the left lower lobe pneumonia. Electronically Signed   By: David  Martinique M.D.   On: 03/12/2016 12:46   Dg Chest Port 1 View  Result Date: 03/07/2016 CLINICAL DATA:  Respiratory failure. EXAM: PORTABLE CHEST 1 VIEW COMPARISON:  03/04/2016. FINDINGS: Interim placement of tracheostomy tube. Left IJ line in stable position. Heart size stable. Low lung volumes with mild bibasilar infiltrates. Small left pleural effusion cannot be excluded. Elevation left hemidiaphragm again noted. IMPRESSION: 1. Tracheostomy tube in good anatomic position. Left IJ line in good anatomic position. 2. Low lung volumes. Persistent left base infiltrate. No change from prior exam. Electronically Signed   By: Marcello Moores  Register   On: 03/07/2016 16:09   Dg Chest Port 1 View  Result Date: 03/07/2016 CLINICAL DATA:  Intubated patient, acute respiratory failure, pneumonia EXAM: PORTABLE CHEST 1 VIEW COMPARISON:  Portable chest x-ray of March 04, 2016 FINDINGS: The lungs are borderline hypoinflated. The retrocardiac region on the left remains dense with obscuration of the mildly elevated hemidiaphragm. The heart is  not enlarged. The pulmonary vascularity is not engorged. The endotracheal tube tip lies 5.6 cm above the carina. The left internal jugular venous catheter tip projects over the midportion of the SVC. IMPRESSION: Persistent left lower lobe atelectasis or pneumonia somewhat improved since the previous study.  Probable small left pleural effusion. The support devices are in stable position. Electronically Signed   By: David  Martinique M.D.   On: 03/07/2016 06:49   Dg Chest Port 1 View  Result Date: 03/04/2016 CLINICAL DATA:  Acute respiratory failure. EXAM: PORTABLE CHEST 1 VIEW COMPARISON:  03/01/2016 FINDINGS: Endotracheal tube is in place with tip 4.1 cm above the carina. Left IJ central line tip to the superior vena cava. There is elevation of left hemidiaphragm. There is increasing opacity within the left lower lobe. No pulmonary edema. Right lung remains clear. IMPRESSION: Increasing left lower lobe infiltrate. Stable elevation of left hemidiaphragm. Electronically Signed   By: Nolon Nations M.D.   On: 03/04/2016 07:25   Dg Chest Port 1 View  Result Date: 03/01/2016 CLINICAL DATA:  Hypoxia EXAM: PORTABLE CHEST 1 VIEW COMPARISON:  February 29, 2016 FINDINGS: Endotracheal tube tip is 3.2 cm above the carina. Central catheter tip is at the cavoatrial junction. No pneumothorax. There is patchy consolidation in the left base with small left pleural effusion. Right lung is clear. Heart is upper normal in size with pulmonary vascularity within normal limits. No adenopathy. No bone lesions. IMPRESSION: Tube and catheter positions as described without pneumothorax. Consolidation left base with small left pleural effusion. Right lung clear. Electronically Signed   By: Lowella Grip III M.D.   On: 03/01/2016 07:50   Dg Chest Port 1 View  Result Date: 02/29/2016 CLINICAL DATA:  Hypoxia EXAM: PORTABLE CHEST 1 VIEW COMPARISON:  February 28, 2016 FINDINGS: Endotracheal tube tip is 2.3 cm above the carina. Central catheter tip is at the cavoatrial junction. No pneumothorax. There is mild bibasilar atelectasis. Lungs elsewhere clear. Heart size and pulmonary vascularity are normal. No adenopathy. No bone lesions. IMPRESSION: Tube and catheter positions as described without pneumothorax. Bibasilar atelectasis. No  frank edema or consolidation. Stable cardiac silhouette. Electronically Signed   By: Lowella Grip III M.D.   On: 02/29/2016 07:07   Dg Chest Port 1 View  Result Date: 02/28/2016 CLINICAL DATA:  Respiratory failure.  Shortness of breath. EXAM: PORTABLE CHEST 1 VIEW COMPARISON:  02/27/2016 . FINDINGS: Endotracheal tube in stable position. Low lung volumes with mild bibasilar atelectasis. Interim improvement from prior exam. Left base atelectasis/ infiltrate has cleared. Interim near complete resolution of small bilateral pleural effusions. No pneumothorax . IMPRESSION: 1. Lines and tubes in stable position. 2. Low lung volumes with mild bibasilar subsegmental atelectasis. Interim significant improvement from prior exam. Interim near complete resolution of small bilateral pleural effusions . Electronically Signed   By: Marcello Moores  Register   On: 02/28/2016 06:54   Dg Chest Port 1 View  Result Date: 02/27/2016 CLINICAL DATA:  Respiratory failure. EXAM: PORTABLE CHEST 1 VIEW COMPARISON:  02/26/2016. FINDINGS: Endotracheal tube, left IJ line in stable position. Heart size normal. Low lung volumes with basilar atelectasis. Developing left base infiltrate. Tiny pleural effusions cannot be excluded. No pneumothorax . IMPRESSION: 1. Lines and tubes in stable position. 2. Low lung volumes with basilar atelectasis. Developing left lower lobe infiltrate. Small bilateral pleural effusions cannot be excluded. Electronically Signed   By: Marcello Moores  Register   On: 02/27/2016 07:13   Dg Chest Port 1 View  Result Date: 02/26/2016 CLINICAL  DATA:  Acute respiratory failure. EXAM: PORTABLE CHEST 1 VIEW COMPARISON:  02/25/2016 FINDINGS: Endotracheal tube has tip 3.6 cm above the carina. Left IJ central venous catheters unchanged with tip over the right atrium. Lungs are hypoinflated with linear atelectasis over the right midlung. Mild patchy opacification over the medial right upper lobe. Mild stable cardiomegaly. Remainder  of the exam is unchanged. IMPRESSION: Mild opacification over the medial right upper lobe as cannot exclude residual infection. Minimal linear atelectasis right midlung. Tubes and lines as described. Electronically Signed   By: Marin Olp M.D.   On: 02/26/2016 07:28   Dg Chest Port 1 View  Result Date: 02/25/2016 CLINICAL DATA:  Acute respiratory failure.  Endotracheal tube. EXAM: PORTABLE CHEST 1 VIEW COMPARISON:  02/24/2016 FINDINGS: Endotracheal tube and left IJ central venous catheter are unchanged. Lungs are hypoinflated with minimal right base opacification unchanged likely atelectasis. Subtle prominence of the perihilar markings. Cardiomediastinal silhouette and remainder of the exam is unchanged. IMPRESSION: Stable mild right base opacification likely atelectasis. Suggestion of minimal vascular congestion. Tubes and lines unchanged. Electronically Signed   By: Marin Olp M.D.   On: 02/25/2016 08:01   Dg Chest Port 1 View  Result Date: 02/24/2016 CLINICAL DATA:  Acute respiratory failure. EXAM: PORTABLE CHEST 1 VIEW COMPARISON:  02/23/2016 and chest CT 02/22/2016 FINDINGS: Endotracheal tube unchanged. Interval placement of left IJ central venous catheter which has tip at the cavoatrial junction. Patient slightly rotated to the right. Lungs are hypoinflated with mild stable bibasilar opacification which may be due to atelectasis or infection. Increased density in the right hilar region slightly worse. No pneumothorax. Cardiomediastinal silhouette and remainder of the exam is unchanged. IMPRESSION: Persistent bibasilar opacification with slight worsening right hilar opacification suggesting atelectasis or infection. Tubes and lines as described. Electronically Signed   By: Marin Olp M.D.   On: 02/24/2016 08:44   Dg Chest Port 1 View  Result Date: 02/23/2016 CLINICAL DATA:  Initial evaluation for acute respiratory failure. EXAM: PORTABLE CHEST 1 VIEW COMPARISON:  Prior radiograph from  02/22/2016. FINDINGS: Patient remains intubated with the tip of an endotracheal tube positioned 2.9 cm above the carina. Cardiac and mediastinal silhouettes are stable, and remain within normal limits. Lungs are hypoinflated. Patchy bibasilar opacities, right greater than left, again suspicious for possible multi lobar pneumonia. Overall, these are slightly more dense as compared to the prior exam. Underlying pulmonary interstitial edema is improved. Asymmetric prominence of the pulmonary vasculature within the right lung favored to be related to patient rotation on this exam. No pneumothorax. No acute osseous abnormality. IMPRESSION: 1. Tip of the endotracheal tube 2.9 cm above the carina. 2. Shallow lung inflation with persistent right greater than left bibasilar opacities, compatible with multi lobar pneumonia. Overall, these are slightly more dense in appearance as compared to previous exam. 3. Improved/resolved underlying pulmonary interstitial edema. Electronically Signed   By: Jeannine Boga M.D.   On: 02/23/2016 06:30   Dg Chest Port 1 View  Result Date: 02/22/2016 CLINICAL DATA:  Respiratory failure. EXAM: PORTABLE CHEST 1 VIEW COMPARISON:  CT chest 02/22/2016 FINDINGS: Endotracheal tube with the tip 4.5 cm above the carina. Patchy areas of airspace disease in the right upper and lower lobes. Patchy airspace disease in the left lower lobe. No pleural effusion or pneumothorax. Stable cardiomediastinal silhouette. IMPRESSION: 1. Endotracheal tube with the tip 4.5 cm above the carina. 2. Airspace disease in the right upper, right lower and left lower lobes most consistent with multi lobar pneumonia.  Electronically Signed   By: Kathreen Devoid   On: 02/22/2016 16:03     LOS: 30 days   Thurnell Lose, MD  [facility] UGGPC:[phone]  If 7PM-7AM, please contact night-coverage [email or web address] Password [email or web address] 03/23/2016, 10:45 AM

## 2016-03-24 LAB — GLUCOSE, CAPILLARY
GLUCOSE-CAPILLARY: 87 mg/dL (ref 65–99)
GLUCOSE-CAPILLARY: 89 mg/dL (ref 65–99)
Glucose-Capillary: 87 mg/dL (ref 65–99)
Glucose-Capillary: 93 mg/dL (ref 65–99)

## 2016-03-24 NOTE — Clinical Social Work Placement (Deleted)
   CLINICAL SOCIAL WORK PLACEMENT  NOTE  Date:  03/24/2016  Patient Details  Name: [name] SchmidtChevella [name] MRN: [id number] Date of Birth: 08/22/1970  Clinical Social Work is seeking post-discharge placement for this patient at the Skilled  Nursing Facility level of care (*CSW will initial, date and re-position this form in  chart as items are completed):  Yes   Patient/family provided with [place] Clinical Social Work Department's list of facilities offering this level of care within the geographic area requested by the patient (or if unable, by the patient's family).  Yes   Patient/family informed of their freedom to choose among providers that offer the needed level of care, that participate in Medicare, Medicaid or managed care program needed by the patient, have an available bed and are willing to accept the patient.  Yes   Patient/family informed of [facility]'s ownership interest in [facility] and [facility], as well as of the fact that they are under no obligation to receive care at these facilities.  PASRR submitted to EDS on 03/08/16     PASRR number received on 03/08/16     Existing PASRR number confirmed on       FL2 transmitted to all facilities in geographic area requested by pt/family on 03/08/16     FL2 transmitted to all facilities within larger geographic area on       Patient informed that his/her managed care company has contracts with or will negotiate with certain facilities, including the following:            Patient/family informed of bed offers received.  [facility]     Physician recommends and patient chooses bed at      Patient to be transferred [facility]  [facility] on 03/24/16.  Patient to be transferred to facility by  Renard Hamper([facility])     Patient family notified on 03/24/16 of transfer.  Name of family member notified:  5     PHYSICIAN Please sign FL2     Additional Comment:     _______________________________________________ Norlene DuelBROWN, Mohmed Farver B, LCSWA 03/24/2016, 3:14 PM

## 2016-03-24 NOTE — Clinical Social Work Note (Signed)
CSW contacted by bedside RN that family would like to speak to [place].  CSW met with pt/2 sisters/Son @ bedside. The family is upset that neither transport to [facility] has worked out. CSW apologized and assured the family we will safely transfer pt to the facility.. The family reports that they would like to follow medical transport to facility to make sure pt gets to the facility safely and they would like to view the facility.Facility was expecting family earlier however faxed paper work for pt's sister to sign.  The family reported to [facility] they were hoping to keep pt in the hospital for another week. The family feels the hospital has been inconsistent with setting up transportation and this should warrant extra time. CSW advised MD has DC Summary in and that it is not likely pt will be able to stay longer.  CSW explained the hospitals role and how transport can run behind due to traffic, late pick ups, etc. The family understands that [facility] is coming from [place] this evening and may not be here at exactly 630pm. The family reports that no matter what time they arrive they will follow pt to the facility.  CSW did have conversation with admissions, pt's bed is expected to be filled today and no guarantee the facility can continue to hold be. CSW will relay to the family.  Overall the family is a bit disappointed in the process and very disappointed pt could not be placed in [place]. CSW explained unfortunately this is the only facility that has accepted pt.  CSW will remain available for family for emotional support.  Armend Hochstatter B. Joline Maxcy Clinical Social Work Dept Weekend Social Worker [phone] 3:25 PM

## 2016-03-24 NOTE — Progress Notes (Signed)
Left IJ CVL removed prior to d/c per MD order. Pressure held x15 mins no bleeding or complications noted. Occlusive pressure dressing applied.

## 2016-03-24 NOTE — Clinical Social Work Placement (Signed)
   CLINICAL SOCIAL WORK PLACEMENT  NOTE  Date:  03/24/2016  Patient Details  Name: [name] [name] MRN: [id number] Date of Birth: 09/19/1970  Clinical Social Work is seeking post-discharge placement for this patient at the Skilled  Nursing Facility level of care (*CSW will initial, date and re-position this form in  chart as items are completed):  Yes   Patient/family provided with [place] Clinical Social Work Department's list of facilities offering this level of care within the geographic area requested by the patient (or if unable, by the patient's family).  Yes   Patient/family informed of their freedom to choose among providers that offer the needed level of care, that participate in Medicare, Medicaid or managed care program needed by the patient, have an available bed and are willing to accept the patient.  Yes   Patient/family informed of [facility]'s ownership interest in [facility] and [facility], as well as of the fact that they are under no obligation to receive care at these facilities.  PASRR submitted to EDS on 03/08/16     PASRR number received on 03/08/16     Existing PASRR number confirmed on       FL2 transmitted to all facilities in geographic area requested by pt/family on 03/08/16     FL2 transmitted to all facilities within larger geographic area on       Patient informed that his/her managed care company has contracts with or will negotiate with certain facilities, including the following:            Patient/family informed of bed offers received.  Patient [facility] bed [facility]  [facility]     Physician recommends and patient chooses bed at      Patient to be [facility] on 03/24/16.  Patient to be transferred to facility by  Renard Hamper([facility])     Patient family notified on 03/24/16 of transfer.  Name of family member notified:  5     PHYSICIAN Please sign FL2     Additional Comment:     _______________________________________________ Norlene DuelBROWN, Macklyn Glandon B, LCSWA 03/24/2016, 3:13 PM

## 2016-03-24 NOTE — Clinical Social Work Note (Signed)
CSW notified by Clydie BraunKaren in admissions DC Summary not dated correctly. CSW will notify nurse that this needs to be updated before pt can go to facility.  Kaytee Taliercio B. Gean QuintBrown,MSW, LCSWA Clinical Social Work Dept Weekend Social Worker [phone] 3:56 PM

## 2016-03-24 NOTE — Progress Notes (Signed)
PROGRESS NOTE  Patient has been discharged on 03/21/2016, we await transportation on 03/24/2016       PATIENT DETAILS Name: [name] [name] Age: 46 y.o. Sex: female Date of Birth: 1970-05-06 Admit Date: 02/22/2016 Admitting Physician Marshell Garfinkel, MD QJF:HLKT, SAAD, MD  Brief Narrative:  Patient is a 46 y.o. female with past medical history of frontotemporal dementia of an resident of a skilled nursing facility-apparently contracted and aphasic at baseline-brought to the hospital on 02/22/16 with acute respiratory failure from multilobar pneumonia. She was intubated on admission, and was difficult to wean and subsequently required a tracheostomy on 1/4. She is still ventilator dependent. Hospital course was complicated by development of multiple electrolyte abnormalities, and possible C. difficile colitis. See below for further details  Subjective:  Patient in bed, remains unresponsive, does not respond to commands-lying comfortably in bed. Per RN no major events overnight  Assessment/Plan:  Acute hypoxemic respiratory failure: Secondary to pneumonia. Intubated and unable to wean, tracheostomy placed this admission on 03/07/2016 by pulmonary critical care, continue routine trach site care, likely to remain on long-term Vent for supportive care, she will require SNF at discharge, per social work has a bed at [facility] on 03/21/2016, she will be transported on 03/24/2016 once IV antibiotic course is finished.  Septic shock secondary to multilobar pneumonia, C Diff colitis, ESBL UTI: Sepsis pathophysiology has resolved. Mild leukocytosis persists-but patient is afebrile. Her blood cultures on 1/2 are negative, BAL culture on 1/4 positive for Pseudomonas and MRSA, urine culture positive for ESBL Escherichia coli and Proteus. Remains on empiric vancomycin and meropenem with stop date of 1/21.   Furthermore, hospital course was complicated by diarrhea-thought  to be due to a combination of normal virus and C. difficile colitis and remains on vancomycin (stop date 1/27-with treatment extended for 1 week following completion of tx for MRSA/Pseudomonas) through the PEG tube. Per RN stools getting more formed.   Anemia: Likely secondary to acute illness, follow.  Chronic dysphagia: Has a PEG tube in place.  History of frontotemporal dementia: Reviewed H&P- contracted, not verbal and bedbound at discharge. Resident of SNF. Has PEG tube and indwelling Foley catheter.  Palliative care/goals of care: Very poor prognoses-seen by palliative care during this hospital stay, family still desires full aggressive care in spite of poor overall prognoses.   DVT Prophylaxis: Prophylactic Lovenox   Code Status: Full code   Family Communication: Spoke to patient's sister [name] [name] on 1/17  Disposition Plan: Remain inpatient-SNF on likely on 1/18  Antimicrobial agents: Anti-infectives    Start     Dose/Rate Route Frequency Ordered Stop   03/23/16 0800  meropenem (MERREM) 1 g in sodium chloride 0.9 % 100 mL IVPB     1 g 200 mL/hr over 30 Minutes Intravenous Every 8 hours 03/23/16 0630 03/24/16 0744   03/21/16 0000  meropenem 1 g in sodium chloride 0.9 % 100 mL     1 g 200 mL/hr over 30 Minutes Intravenous Every 8 hours 03/21/16 0907     03/21/16 0000  vancomycin 1,250 mg in sodium chloride 0.9 % 250 mL     1,250 mg 166.7 mL/hr over 90 Minutes Intravenous Every 24 hours 03/21/16 0907     03/21/16 0000  vancomycin (VANCOCIN) 50 mg/mL oral solution     125 mg Oral 4 times daily 03/21/16 6256  03/17/16 2300  vancomycin (VANCOCIN) 1,250 mg in sodium chloride 0.9 % 250 mL IVPB     1,250 mg 166.7 mL/hr over 90 Minutes Intravenous Every 24 hours 03/17/16 1711 03/23/16 2359   03/17/16 2200  vancomycin (VANCOCIN) 500 mg in sodium chloride 0.9 % 100 mL IVPB  Status:  Discontinued     500 mg 100 mL/hr over 60 Minutes Intravenous Every 12 hours 03/17/16 1658  03/17/16 1711   03/14/16 1600  vancomycin (VANCOCIN) IVPB 750 mg/150 ml premix  Status:  Discontinued     750 mg 150 mL/hr over 60 Minutes Intravenous Every 12 hours 03/14/16 0526 03/17/16 1658   03/10/16 1400  vancomycin (VANCOCIN) 50 mg/mL oral solution 125 mg     125 mg Oral 4 times daily 03/10/16 1230 03/30/16 2359   03/10/16 0400  vancomycin (VANCOCIN) 500 mg in sodium chloride 0.9 % 100 mL IVPB  Status:  Discontinued     500 mg 100 mL/hr over 60 Minutes Intravenous Every 12 hours 03/09/16 1524 03/14/16 0526   03/09/16 1600  meropenem (MERREM) 1 g in sodium chloride 0.9 % 100 mL IVPB  Status:  Discontinued     1 g 200 mL/hr over 30 Minutes Intravenous Every 8 hours 03/09/16 1524 03/23/16 0630   03/09/16 1600  vancomycin (VANCOCIN) IVPB 1000 mg/200 mL premix     1,000 mg 200 mL/hr over 60 Minutes Intravenous  Once 03/09/16 1524 03/09/16 1748   03/08/16 1100  cefTAZidime (FORTAZ) 1 g in dextrose 5 % 50 mL IVPB  Status:  Discontinued     1 g 100 mL/hr over 30 Minutes Intravenous Every 12 hours 03/08/16 0945 03/09/16 1511   03/07/16 1400  vancomycin (VANCOCIN) 50 mg/mL oral solution 125 mg  Status:  Discontinued     125 mg Oral 4 times daily 03/07/16 1043 03/09/16 1518   03/06/16 1300  cefTRIAXone (ROCEPHIN) 1 g in dextrose 5 % 50 mL IVPB  Status:  Discontinued     1 g 100 mL/hr over 30 Minutes Intravenous Every 24 hours 03/06/16 1158 03/08/16 0945   02/26/16 1700  vancomycin (VANCOCIN) IVPB 1000 mg/200 mL premix  Status:  Discontinued     1,000 mg 200 mL/hr over 60 Minutes Intravenous Every 8 hours 02/26/16 1026 02/26/16 1028   02/26/16 1030  ciprofloxacin (CIPRO) IVPB 400 mg  Status:  Discontinued     400 mg 200 mL/hr over 60 Minutes Intravenous Every 12 hours 02/26/16 1026 03/02/16 0949   02/25/16 1930  vancomycin (VANCOCIN) IVPB 1000 mg/200 mL premix  Status:  Discontinued     1,000 mg 200 mL/hr over 60 Minutes Intravenous Every 8 hours 02/25/16 1858 02/26/16 1017   02/24/16 1915   vancomycin (VANCOCIN) IVPB 1000 mg/200 mL premix  Status:  Discontinued     1,000 mg 200 mL/hr over 60 Minutes Intravenous Every 8 hours 02/24/16 1106 02/25/16 0927   02/24/16 1115  vancomycin (VANCOCIN) 1,250 mg in sodium chloride 0.9 % 250 mL IVPB     1,250 mg 166.7 mL/hr over 90 Minutes Intravenous  Once 02/24/16 1106 02/24/16 1430   02/22/16 1800  vancomycin (VANCOCIN) IVPB 750 mg/150 ml premix  Status:  Discontinued     750 mg 150 mL/hr over 60 Minutes Intravenous Every 8 hours 02/22/16 0949 02/24/16 1106   02/22/16 1430  vancomycin (VANCOCIN) 50 mg/mL oral solution 125 mg     125 mg Oral 4 times daily 02/22/16 1420 03/07/16 1359   02/22/16 1400  piperacillin-tazobactam (ZOSYN) IVPB  3.375 g  Status:  Discontinued     3.375 g 12.5 mL/hr over 240 Minutes Intravenous Every 8 hours 02/22/16 0949 02/26/16 0955   02/22/16 1030  vancomycin (VANCOCIN) 500 mg in sodium chloride 0.9 % 100 mL IVPB     500 mg 100 mL/hr over 60 Minutes Intravenous  Once 02/22/16 0949 02/22/16 1212   02/22/16 0900  azithromycin (ZITHROMAX) 500 mg in dextrose 5 % 250 mL IVPB  Status:  Discontinued     500 mg 250 mL/hr over 60 Minutes Intravenous Every 24 hours 02/22/16 0846 02/24/16 0859   02/22/16 0615  piperacillin-tazobactam (ZOSYN) IVPB 3.375 g     3.375 g 100 mL/hr over 30 Minutes Intravenous  Once 02/22/16 0601 02/22/16 0708   02/22/16 0615  vancomycin (VANCOCIN) IVPB 1000 mg/200 mL premix     1,000 mg 200 mL/hr over 60 Minutes Intravenous  Once 02/22/16 0601 02/22/16 0748      Procedures: ETT 12/21>>1/4 Trach 1/4 >> Left IJ 12/22 >>1/21 Foley >> PEG >> Rectal tube >> DC 1/21  CONSULTS: PCCM  Palliative Care  Time spent: 25- minutes-Greater than 50% of this time was spent in counseling, explanation of diagnosis, planning of further management, and coordination of care.  MEDICATIONS: Scheduled Meds: . chlorhexidine gluconate (MEDLINE KIT)  15 mL Mouth Rinse BID  . enoxaparin (LOVENOX)  injection  40 mg Subcutaneous Q24H  . famotidine  20 mg Per Tube BID  . free water  200 mL Per Tube Q6H  . furosemide  20 mg Intravenous Daily  . insulin aspart  2-6 Units Subcutaneous Q4H  . mouth rinse  15 mL Mouth Rinse QID  . sodium chloride flush  10-40 mL Intracatheter Q12H  . vancomycin  125 mg Oral QID   Continuous Infusions: . feeding supplement (VITAL AF 1.2 CAL) 1,000 mL (03/24/16 0527)   PRN Meds:.sodium chloride, acetaminophen, albuterol, ondansetron (ZOFRAN) IV, sodium chloride flush   PHYSICAL EXAM: Vital signs: Vitals:   03/24/16 0334 03/24/16 0411 03/24/16 0700 03/24/16 0840  BP: (!) 99/59  90/79 96/73  Pulse: 83  88 89  Resp: _0 Temp: 98.3 F (36.8 C)  98.6 F (37 C)   TempSrc: Oral  Oral   SpO2: 100% 100% 100% 100%  Weight: 67.5 kg (148 lb 13 oz)     Height:       Filed Weights   03/22/16 0415 03/23/16 0315 03/24/16 0334  Weight: 67 kg (147 lb 11.3 oz) 60.3 kg (132 lb 15 oz) 67.5 kg (148 lb 13 oz)   Body mass index is 24.76 kg/m.   General exam: Lethargic, does not respond to voice. Not following any commands Respiratory system: Vent-trach. Course breath sounds  Cardiovascular system: S1 & S2 heard, tachycardic, regular. No JVD, murmurs, rubs, gallops or clicks.  Gastrointestinal system: Abdomen is nondistended, soft and nontender.Peg tube in pace Central nervous system: does not respond to voice  Extremities: Symmetric, contracted at knees, b/l wrist drop Skin: No rashes, lesions or ulcers on exposed skin    I have personally reviewed following labs and imaging studies  LABORATORY DATA: CBC:  Recent Labs Lab 03/19/16 0300 03/20/16 0729  WBC 13.3* 10.7*  HGB 8.9* 8.7*  HCT 29.2* 27.4*  MCV 84.6 83.8  PLT 304 646    Basic Metabolic Panel:  Recent Labs Lab 03/18/16 0355 03/20/16 0729  NA 139 138  K 3.7 3.8  CL 101 101  CO2 30 28  GLUCOSE 106* 104*  BUN 20 16  CREATININE 0.67 0.59  CALCIUM 8.9 9.1     GFR: Estimated Creatinine Clearance: 79.1 mL/min (by C-G formula based on SCr of 0.59 mg/dL).  Liver Function Tests: No results for input(s): AST, ALT, ALKPHOS, BILITOT, PROT, ALBUMIN in the last 168 hours. No results for input(s): LIPASE, AMYLASE in the last 168 hours. No results for input(s): AMMONIA in the last 168 hours.  Coagulation Profile: No results for input(s): INR, PROTIME in the last 168 hours.  Cardiac Enzymes: No results for input(s): CKTOTAL, CKMB, CKMBINDEX, TROPONINI in the last 168 hours.  BNP (last 3 results) No results for input(s): PROBNP in the last 8760 hours.  HbA1C: No results for input(s): HGBA1C in the last 72 hours.  CBG:  Recent Labs Lab 03/23/16 1635 03/23/16 2002 03/23/16 2326 03/24/16 0425 03/24/16 0732  GLUCAP 87 111* 108* 87 87    Lipid Profile: No results for input(s): CHOL, HDL, LDLCALC, TRIG, CHOLHDL, LDLDIRECT in the last 72 hours.  Thyroid Function Tests: No results for input(s): TSH, T4TOTAL, FREET4, T3FREE, THYROIDAB in the last 72 hours.  Anemia Panel: No results for input(s): VITAMINB12, FOLATE, FERRITIN, TIBC, IRON, RETICCTPCT in the last 72 hours.  Urine analysis:    Component Value Date/Time   COLORURINE YELLOW 03/05/2016 1725   APPEARANCEUR HAZY (A) 03/05/2016 1725   LABSPEC 1.019 03/05/2016 1725   PHURINE 5.0 03/05/2016 1725   GLUCOSEU NEGATIVE 03/05/2016 1725   HGBUR SMALL (A) 03/05/2016 1725   BILIRUBINUR NEGATIVE 03/05/2016 1725   KETONESUR NEGATIVE 03/05/2016 1725   PROTEINUR 100 (A) 03/05/2016 1725   UROBILINOGEN 1.0 10/17/2013 1957   NITRITE NEGATIVE 03/05/2016 1725   LEUKOCYTESUR LARGE (A) 03/05/2016 1725    Sepsis Labs: Lactic Acid, Venous    Component Value Date/Time   LATICACIDVEN 2.9 (HH) 02/22/2016 1539    MICROBIOLOGY: No results found for this or any previous visit (from the past 240 hour(s)).  RADIOLOGY STUDIES/RESULTS: Ir Cyndy Freeze Guide Cv Line Left  Result Date:  02/23/2016 INDICATION: 46 year old female with multi lobar pneumonia. EXAM: IR LEFT FLOURO GUIDE CV LINE; IR ULTRASOUND GUIDANCE VASC ACCESS LEFT MEDICATIONS: None ANESTHESIA/SEDATION: None FLUOROSCOPY TIME:  Fluoroscopy Time: 0 minutes 6 seconds (1 mGy). COMPLICATIONS: None PROCEDURE: Informed written consent was obtained from the patient's family after a thorough discussion of the procedural risks, benefits and alternatives. All questions were addressed. Maximal Sterile Barrier Technique was utilized including caps, mask, sterile gowns, sterile gloves, sterile drape, hand hygiene and skin antiseptic. A timeout was performed prior to the initiation of the procedure. Ultrasound survey of the left jugular region was performed with images stored and sent to PACs. The patient is prepped and draped in the usual sterile fashion. Skin and subcutaneous tissues were generously infiltrated 1% lidocaine for local anesthesia. Using ultrasound guidance, micropuncture set was used access the left internal jugular vein. Micro wire was passed through the needle into the right heart. Small incision was made in the needle was removed. Peel-away sheath was then placed, and a length of 19 cm was estimated. Triple-lumen 5 French catheter was then ligated at 19 cm and passed through the peel-away sheath. Peel-away sheath was removed and the catheter was sutured in position and flushed. Final image was stored. Patient tolerated the procedure well and remained hemodynamically stable throughout. No complications were encountered and no significant blood loss. IMPRESSION: Status post left IJ central catheter placement. Catheter ready for use. Signed, Dulcy Fanny. Earleen Newport, DO [facility] Electronically Signed  By: Corrie Mckusick D.O.   On: 02/23/2016 17:03   Ir US Guide Vasc Access Left  Result Date: 02/23/2016 INDICATION: 46 year old female with multi lobar pneumonia. EXAM: IR LEFT  FLOURO GUIDE CV LINE; IR ULTRASOUND GUIDANCE VASC ACCESS LEFT MEDICATIONS: None ANESTHESIA/SEDATION: None FLUOROSCOPY TIME:  Fluoroscopy Time: 0 minutes 6 seconds (1 mGy). COMPLICATIONS: None PROCEDURE: Informed written consent was obtained from the patient's family after a thorough discussion of the procedural risks, benefits and alternatives. All questions were addressed. Maximal Sterile Barrier Technique was utilized including caps, mask, sterile gowns, sterile gloves, sterile drape, hand hygiene and skin antiseptic. A timeout was performed prior to the initiation of the procedure. Ultrasound survey of the left jugular region was performed with images stored and sent to PACs. The patient is prepped and draped in the usual sterile fashion. Skin and subcutaneous tissues were generously infiltrated 1% lidocaine for local anesthesia. Using ultrasound guidance, micropuncture set was used access the left internal jugular vein. Micro wire was passed through the needle into the right heart. Small incision was made in the needle was removed. Peel-away sheath was then placed, and a length of 19 cm was estimated. Triple-lumen 5 French catheter was then ligated at 19 cm and passed through the peel-away sheath. Peel-away sheath was removed and the catheter was sutured in position and flushed. Final image was stored. Patient tolerated the procedure well and remained hemodynamically stable throughout. No complications were encountered and no significant blood loss. IMPRESSION: Status post left IJ central catheter placement. Catheter ready for use. Signed, Dulcy Fanny. Earleen Newport, DO [facility] Electronically Signed   By: Corrie Mckusick D.O.   On: 02/23/2016 17:03   Dg Chest Port 1 View  Result Date: 03/12/2016 CLINICAL DATA:  Acute respiratory failure with hypoxia. Tracheostomy patient. EXAM: PORTABLE CHEST 1 VIEW COMPARISON:  Portable chest x-ray of June 05, 2016 and  March 04, 2016. FINDINGS: The lungs remain mildly hypoinflated. Patchy density at the left lung base persists but there has been marked improvement since a study of March 04, 2016. The left hemidiaphragm is mildly elevated. The heart and pulmonary vascularity are normal. The tracheostomy appliance tip projects at the superior margin of the clavicular heads. The left internal jugular venous catheter tip projects over the midportion of the SVC. The bony thorax exhibits no acute abnormality. IMPRESSION: Further interval improvement in the appearance of the left lower lobe pneumonia. Electronically Signed   By: David  Martinique M.D.   On: 03/12/2016 12:46   Dg Chest Port 1 View  Result Date: 03/07/2016 CLINICAL DATA:  Respiratory failure. EXAM: PORTABLE CHEST 1 VIEW COMPARISON:  03/04/2016. FINDINGS: Interim placement of tracheostomy tube. Left IJ line in stable position. Heart size stable. Low lung volumes with mild bibasilar infiltrates. Small left pleural effusion cannot be excluded. Elevation left hemidiaphragm again noted. IMPRESSION: 1. Tracheostomy tube in good anatomic position. Left IJ line in good anatomic position. 2. Low lung volumes. Persistent left base infiltrate. No change from prior exam. Electronically Signed   By: Marcello Moores  Register   On: 03/07/2016 16:09   Dg Chest Port 1 View  Result Date: 03/07/2016 CLINICAL DATA:  Intubated patient, acute respiratory failure, pneumonia EXAM: PORTABLE CHEST 1 VIEW COMPARISON:  Portable chest x-ray of March 04, 2016 FINDINGS: The lungs are borderline hypoinflated. The retrocardiac region on the left remains dense with obscuration of the mildly elevated hemidiaphragm. The heart is not enlarged. The pulmonary vascularity is not engorged. The endotracheal tube  tip lies 5.6 cm above the carina. The left internal jugular venous catheter tip projects over the midportion of the SVC. IMPRESSION: Persistent left lower lobe atelectasis or pneumonia somewhat improved since  the previous study. Probable small left pleural effusion. The support devices are in stable position. Electronically Signed   By: David  Martinique M.D.   On: 03/07/2016 06:49   Dg Chest Port 1 View  Result Date: 03/04/2016 CLINICAL DATA:  Acute respiratory failure. EXAM: PORTABLE CHEST 1 VIEW COMPARISON:  03/01/2016 FINDINGS: Endotracheal tube is in place with tip 4.1 cm above the carina. Left IJ central line tip to the superior vena cava. There is elevation of left hemidiaphragm. There is increasing opacity within the left lower lobe. No pulmonary edema. Right lung remains clear. IMPRESSION: Increasing left lower lobe infiltrate. Stable elevation of left hemidiaphragm. Electronically Signed   By: Nolon Nations M.D.   On: 03/04/2016 07:25   Dg Chest Port 1 View  Result Date: 03/01/2016 CLINICAL DATA:  Hypoxia EXAM: PORTABLE CHEST 1 VIEW COMPARISON:  February 29, 2016 FINDINGS: Endotracheal tube tip is 3.2 cm above the carina. Central catheter tip is at the cavoatrial junction. No pneumothorax. There is patchy consolidation in the left base with small left pleural effusion. Right lung is clear. Heart is upper normal in size with pulmonary vascularity within normal limits. No adenopathy. No bone lesions. IMPRESSION: Tube and catheter positions as described without pneumothorax. Consolidation left base with small left pleural effusion. Right lung clear. Electronically Signed   By: Lowella Grip III M.D.   On: 03/01/2016 07:50   Dg Chest Port 1 View  Result Date: 02/29/2016 CLINICAL DATA:  Hypoxia EXAM: PORTABLE CHEST 1 VIEW COMPARISON:  February 28, 2016 FINDINGS: Endotracheal tube tip is 2.3 cm above the carina. Central catheter tip is at the cavoatrial junction. No pneumothorax. There is mild bibasilar atelectasis. Lungs elsewhere clear. Heart size and pulmonary vascularity are normal. No adenopathy. No bone lesions. IMPRESSION: Tube and catheter positions as described without pneumothorax. Bibasilar  atelectasis. No frank edema or consolidation. Stable cardiac silhouette. Electronically Signed   By: Lowella Grip III M.D.   On: 02/29/2016 07:07   Dg Chest Port 1 View  Result Date: 02/28/2016 CLINICAL DATA:  Respiratory failure.  Shortness of breath. EXAM: PORTABLE CHEST 1 VIEW COMPARISON:  02/27/2016 . FINDINGS: Endotracheal tube in stable position. Low lung volumes with mild bibasilar atelectasis. Interim improvement from prior exam. Left base atelectasis/ infiltrate has cleared. Interim near complete resolution of small bilateral pleural effusions. No pneumothorax . IMPRESSION: 1. Lines and tubes in stable position. 2. Low lung volumes with mild bibasilar subsegmental atelectasis. Interim significant improvement from prior exam. Interim near complete resolution of small bilateral pleural effusions . Electronically Signed   By: Marcello Moores  Register   On: 02/28/2016 06:54   Dg Chest Port 1 View  Result Date: 02/27/2016 CLINICAL DATA:  Respiratory failure. EXAM: PORTABLE CHEST 1 VIEW COMPARISON:  02/26/2016. FINDINGS: Endotracheal tube, left IJ line in stable position. Heart size normal. Low lung volumes with basilar atelectasis. Developing left base infiltrate. Tiny pleural effusions cannot be excluded. No pneumothorax . IMPRESSION: 1. Lines and tubes in stable position. 2. Low lung volumes with basilar atelectasis. Developing left lower lobe infiltrate. Small bilateral pleural effusions cannot be excluded. Electronically Signed   By: Marcello Moores  Register   On: 02/27/2016 07:13   Dg Chest Port 1 View  Result Date: 02/26/2016 CLINICAL DATA:  Acute respiratory failure. EXAM: PORTABLE CHEST 1 VIEW COMPARISON:  02/25/2016 FINDINGS: Endotracheal tube has tip 3.6 cm above the carina. Left IJ central venous catheters unchanged with tip over the right atrium. Lungs are hypoinflated with linear atelectasis over the right midlung. Mild patchy opacification over the medial right upper lobe. Mild stable  cardiomegaly. Remainder of the exam is unchanged. IMPRESSION: Mild opacification over the medial right upper lobe as cannot exclude residual infection. Minimal linear atelectasis right midlung. Tubes and lines as described. Electronically Signed   By: Marin Olp M.D.   On: 02/26/2016 07:28   Dg Chest Port 1 View  Result Date: 02/25/2016 CLINICAL DATA:  Acute respiratory failure.  Endotracheal tube. EXAM: PORTABLE CHEST 1 VIEW COMPARISON:  02/24/2016 FINDINGS: Endotracheal tube and left IJ central venous catheter are unchanged. Lungs are hypoinflated with minimal right base opacification unchanged likely atelectasis. Subtle prominence of the perihilar markings. Cardiomediastinal silhouette and remainder of the exam is unchanged. IMPRESSION: Stable mild right base opacification likely atelectasis. Suggestion of minimal vascular congestion. Tubes and lines unchanged. Electronically Signed   By: Marin Olp M.D.   On: 02/25/2016 08:01   Dg Chest Port 1 View  Result Date: 02/24/2016 CLINICAL DATA:  Acute respiratory failure. EXAM: PORTABLE CHEST 1 VIEW COMPARISON:  02/23/2016 and chest CT 02/22/2016 FINDINGS: Endotracheal tube unchanged. Interval placement of left IJ central venous catheter which has tip at the cavoatrial junction. Patient slightly rotated to the right. Lungs are hypoinflated with mild stable bibasilar opacification which may be due to atelectasis or infection. Increased density in the right hilar region slightly worse. No pneumothorax. Cardiomediastinal silhouette and remainder of the exam is unchanged. IMPRESSION: Persistent bibasilar opacification with slight worsening right hilar opacification suggesting atelectasis or infection. Tubes and lines as described. Electronically Signed   By: Marin Olp M.D.   On: 02/24/2016 08:44     LOS: 31 days   Thurnell Lose, MD  [facility] Pager:[phone]  If 7PM-7AM, please contact night-coverage [email or web address] Password  [email or web address] 03/24/2016, 10:26 AM

## 2016-03-24 NOTE — Progress Notes (Signed)
[facility] to pick [facility] pt around 2000.  Iv did not work as it had previously.  Iv team was paged.  New iv obtained.  [facility] Dis[facility] with iv per Md Singh [facility] [place].  Paper work given to transport.  Report already call by [facility]. Vs stable.

## 2016-03-24 NOTE — Clinical Social Work Note (Signed)
Clinical Social Worker facilitated patient discharge including contacting patient family (@ bedside) and facility to confirm patient discharge plans. Clinical information faxed to facility and family agreeable with plan. CSW re-arranged transport with [facility]. Transport came earlier but  would not take pt without IV. IV replaced and transport set for 6PM today. RN to call report prior to discharge.  Clinical Social Worker will sign off for now as social work intervention is no longer needed. Please consult us again if new need arises.  Macario GoldsJesse Scinto, [place] [phone]

## 2016-09-21 IMAGING — XA IR FLUORO GUIDE CV LINE*L*
1 series · 2 of 2 positions shown · non-contrast
Comparison: none

CLINICAL DATA: 44-year-old female with severe frontotemporal
dementia a recently admitted for fever of unknown origin. Despite no
definitive for source for infection, infectious disease recommend a
course of outpatient intravenous antibiotics. A tunneled IJ PICC
will be placed secondary to severe bilateral upper extremity
contractures.

EXAM:
IR LEFT FLOURO GUIDE CV LINE; IR ULTRASOUND GUIDANCE VASC ACCESS
LEFT
FLUOROSCOPY TIME:  1 minutes 6 seconds for a total of 9 mGy
TECHNIQUE: The left neck and chest were prepped with chlorhexidine, draped in
the usual sterile fashion using maximum barrier technique (cap and
mask, sterile gown, sterile gloves, large sterile sheet, hand
hygiene and cutaneous antiseptic). Local anesthesia was attained by
infiltration with 1% lidocaine.

[Series 1: run · 2 of 2 slices shown]
[im 1/2]
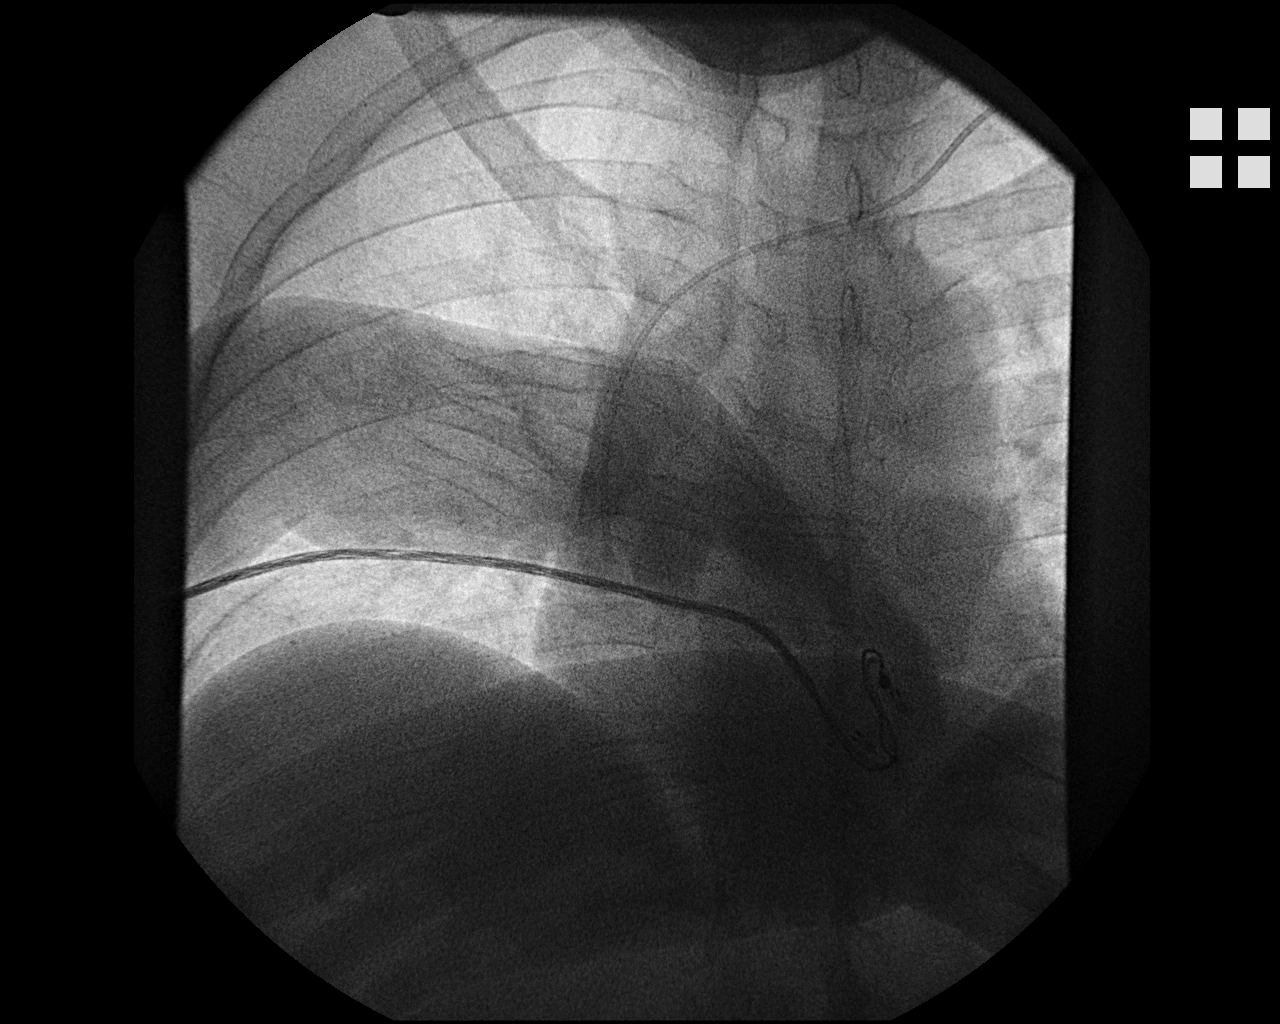
[im 2/2]
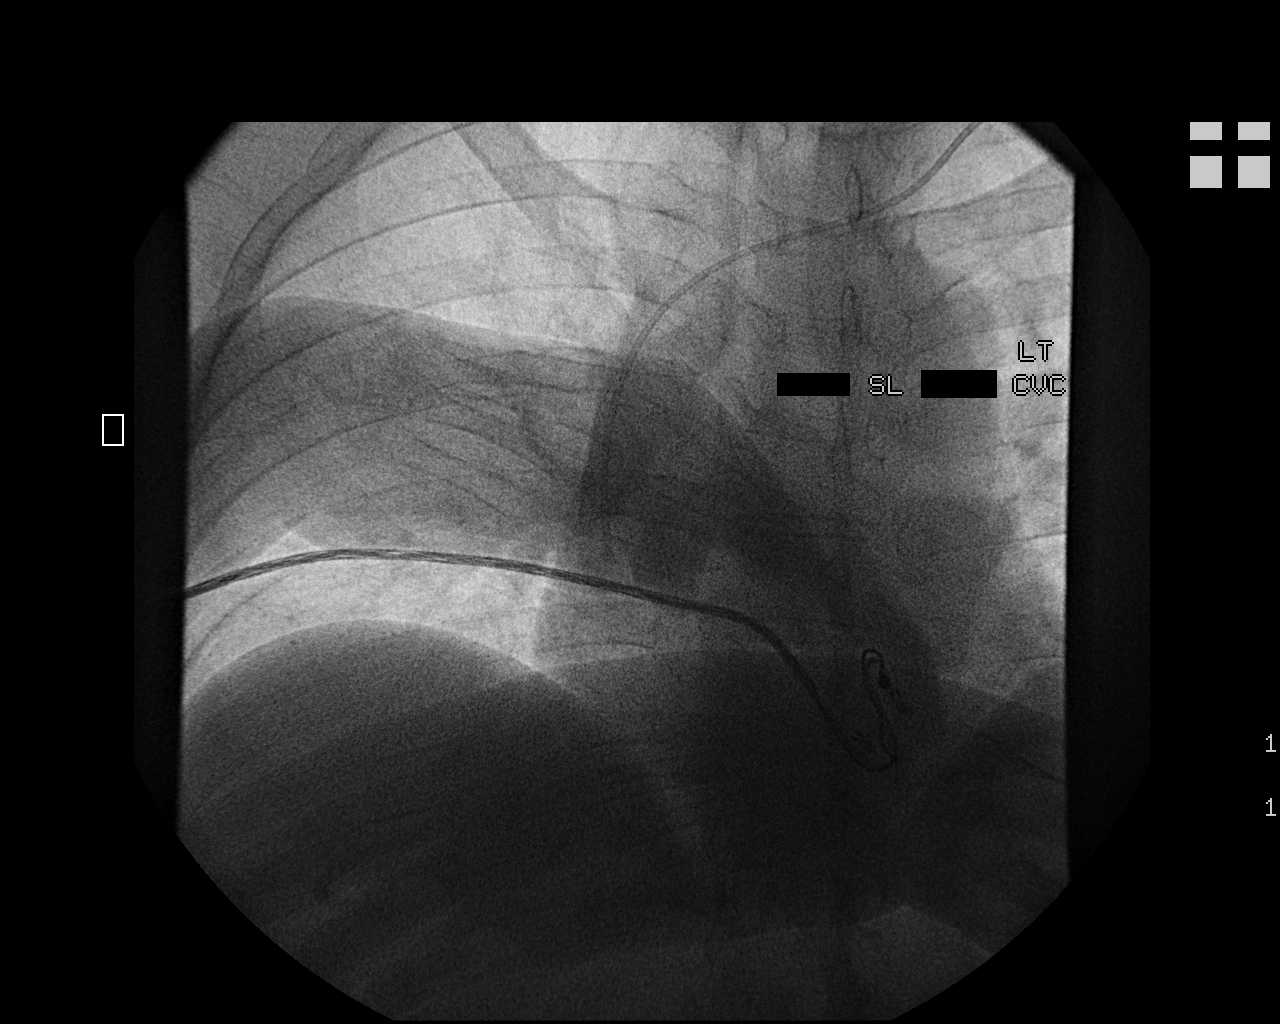

[2 of 2 positions shown; findings below may reference images not displayed]

Ultrasound demonstrated patency of the left internal jugular vein,
and this was documented with an image. Local anesthesia was attained
by infiltration with 1% lidocaine. A small dermatotomy was made.
Under real-time ultrasound guidance, this vein was accessed with a
21 gauge micropuncture needle and image documentation was performed.
The needle was exchanged over a guidewire for a peel-away sheath. A
suitable skin exit site was selected inferior to the clavicle.
Anesthesia was again attained by infiltration with 1% lidocaine. A
small dermatotomy was made. A 5 French single-lumen power injectable
PICC was then tunneled from the skin exit site to the dermatotomy
overlying the venous access site. The catheter was then cut to
length (21 cm) and advanced through the peel-away sheath. The tip
was positioned at the superior cavoatrial junction. Fluoroscopy
during the procedure and fluoro spot radiograph confirms appropriate
catheter position. The catheter was flushed, secured to the skin
with Prolene sutures, and covered with a sterile dressing.

The dermatotomy overlying the venous access site was sealed with
Dermabond.

COMPLICATIONS:
None.  The patient tolerated the procedure well.
IMPRESSION: Successful placement of a left IJ tunneled single-lumen Power PICC
with sonographic and fluoroscopic guidance. The catheter is ready
for use.

## 2017-10-02 DEATH — deceased

## 2017-10-18 IMAGING — CR DG CHEST 1V PORT
1 series · 1 of 1 positions shown · non-contrast
Comparison: 02/24/2016

CLINICAL DATA: Acute respiratory failure.  Endotracheal tube.

EXAM:
PORTABLE CHEST 1 VIEW

[AP]
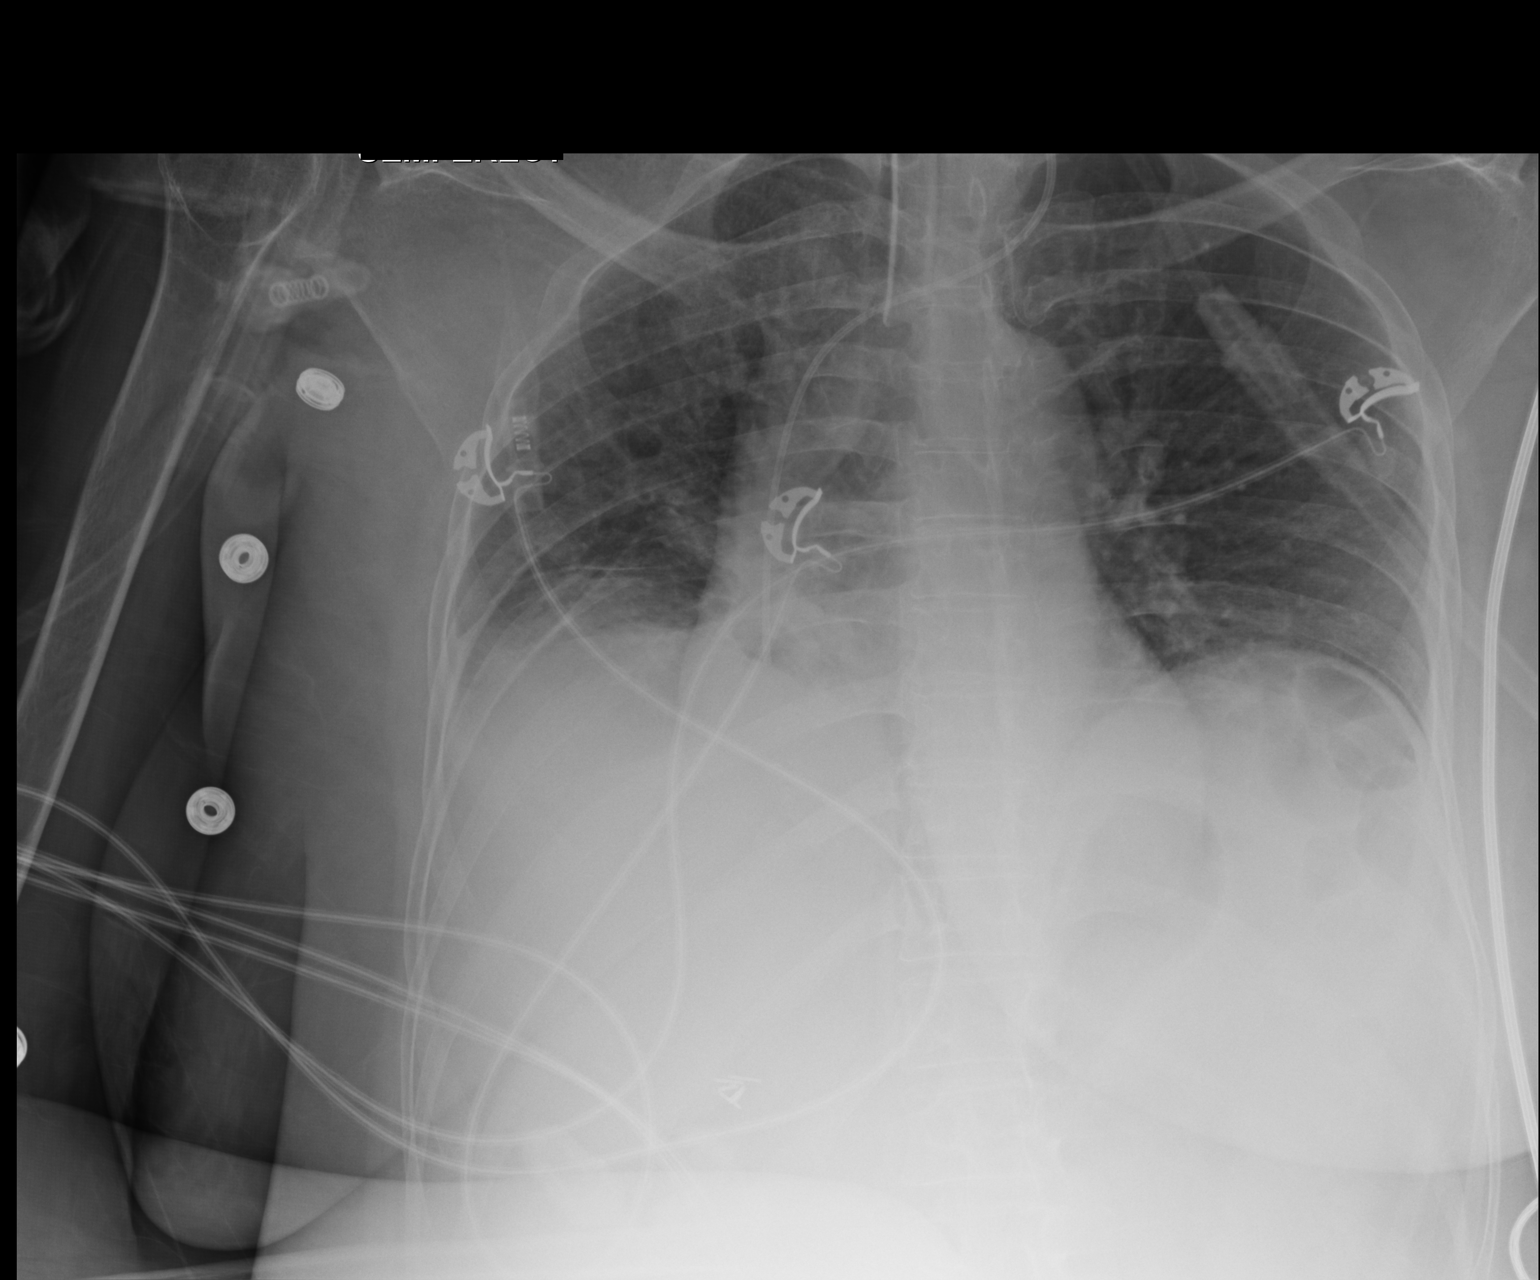

[1 of 1 positions shown; findings below may reference images not displayed]

FINDINGS: Endotracheal tube and left IJ central venous catheter are unchanged.
Lungs are hypoinflated with minimal right base opacification
unchanged likely atelectasis. Subtle prominence of the perihilar
markings. Cardiomediastinal silhouette and remainder of the exam is
unchanged.
IMPRESSION: Stable mild right base opacification likely atelectasis. Suggestion
of minimal vascular congestion.

Tubes and lines unchanged.

## 2017-10-20 IMAGING — CR DG CHEST 1V PORT
1 series · 1 of 1 positions shown · non-contrast
Comparison: 02/26/2016.

CLINICAL DATA: Respiratory failure.

EXAM:
PORTABLE CHEST 1 VIEW

[AP]
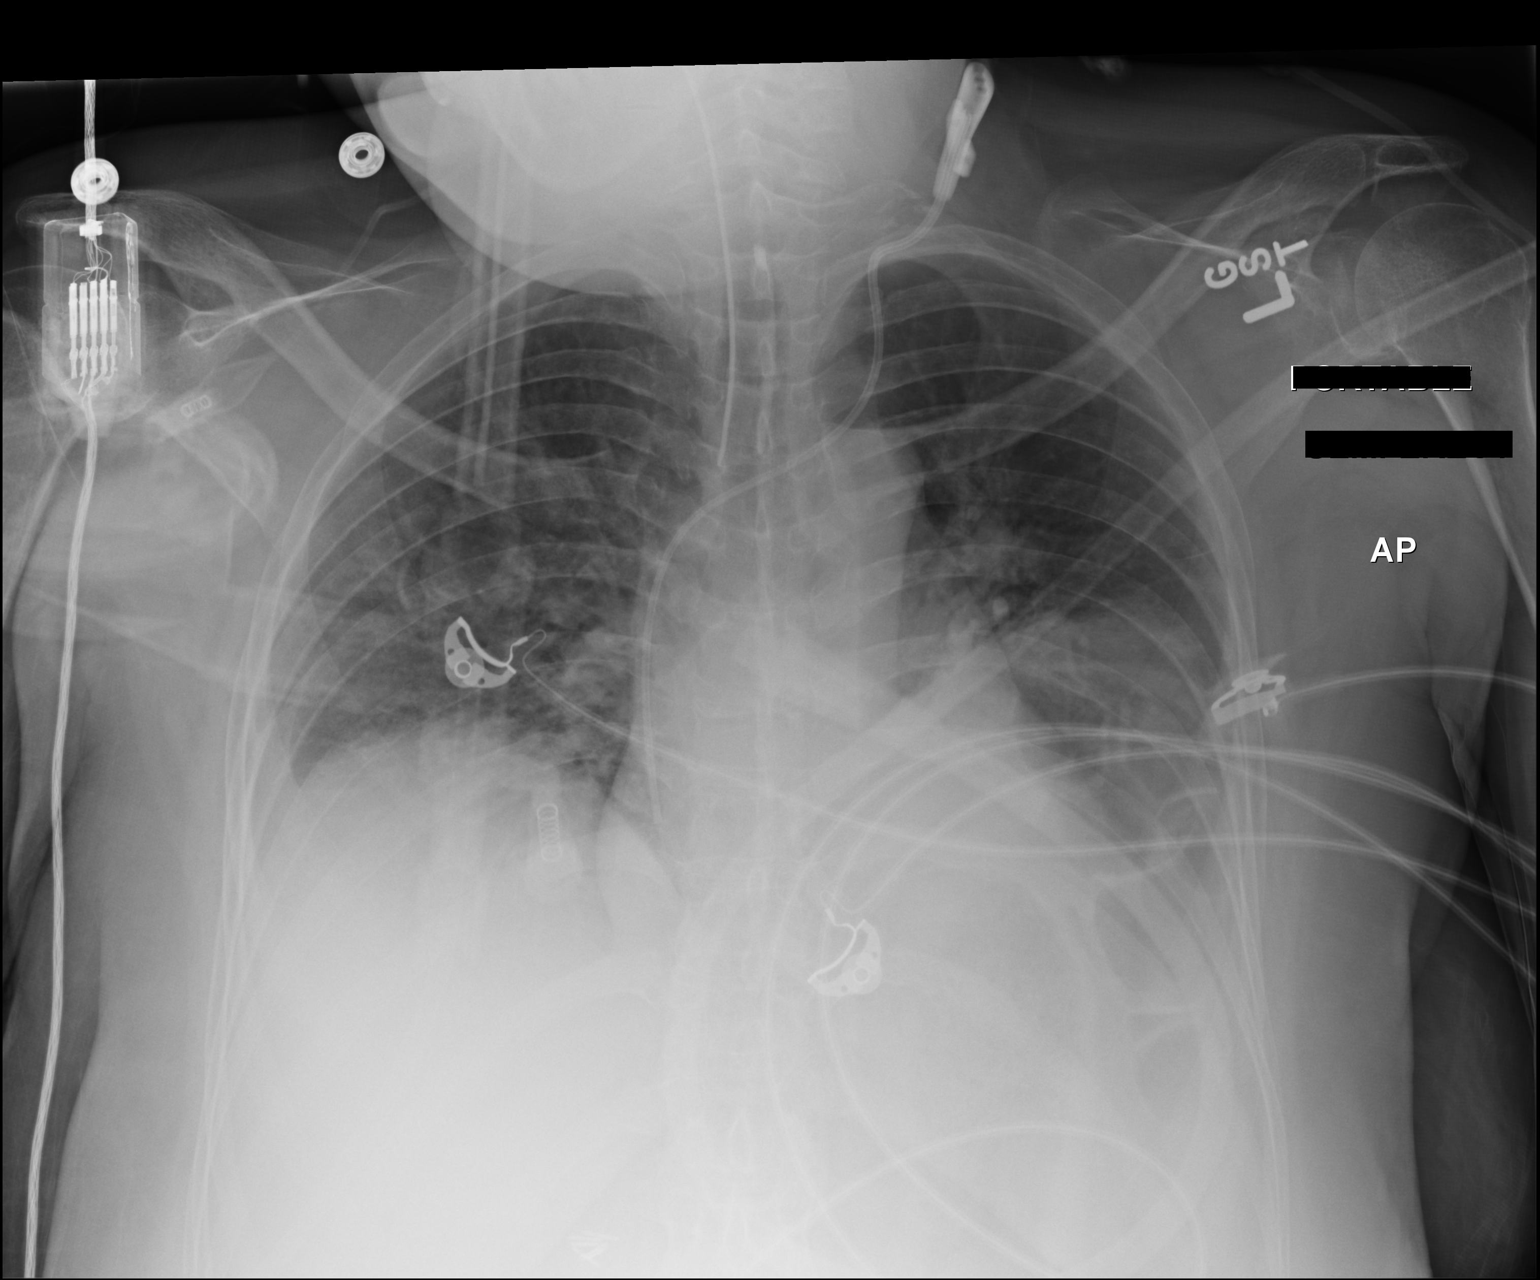

[1 of 1 positions shown; findings below may reference images not displayed]

FINDINGS: Endotracheal tube, left IJ line in stable position. Heart size
normal. Low lung volumes with basilar atelectasis. Developing left
base infiltrate. Tiny pleural effusions cannot be excluded. No
pneumothorax .
IMPRESSION: 1. Lines and tubes in stable position.

2. Low lung volumes with basilar atelectasis. Developing left lower
lobe infiltrate. Small bilateral pleural effusions cannot be
excluded.

## 2017-10-21 IMAGING — CR DG CHEST 1V PORT
1 series · 1 of 1 positions shown · non-contrast
Comparison: 02/27/2016 .

CLINICAL DATA: Respiratory failure.  Shortness of breath.

EXAM:
PORTABLE CHEST 1 VIEW

[AP]
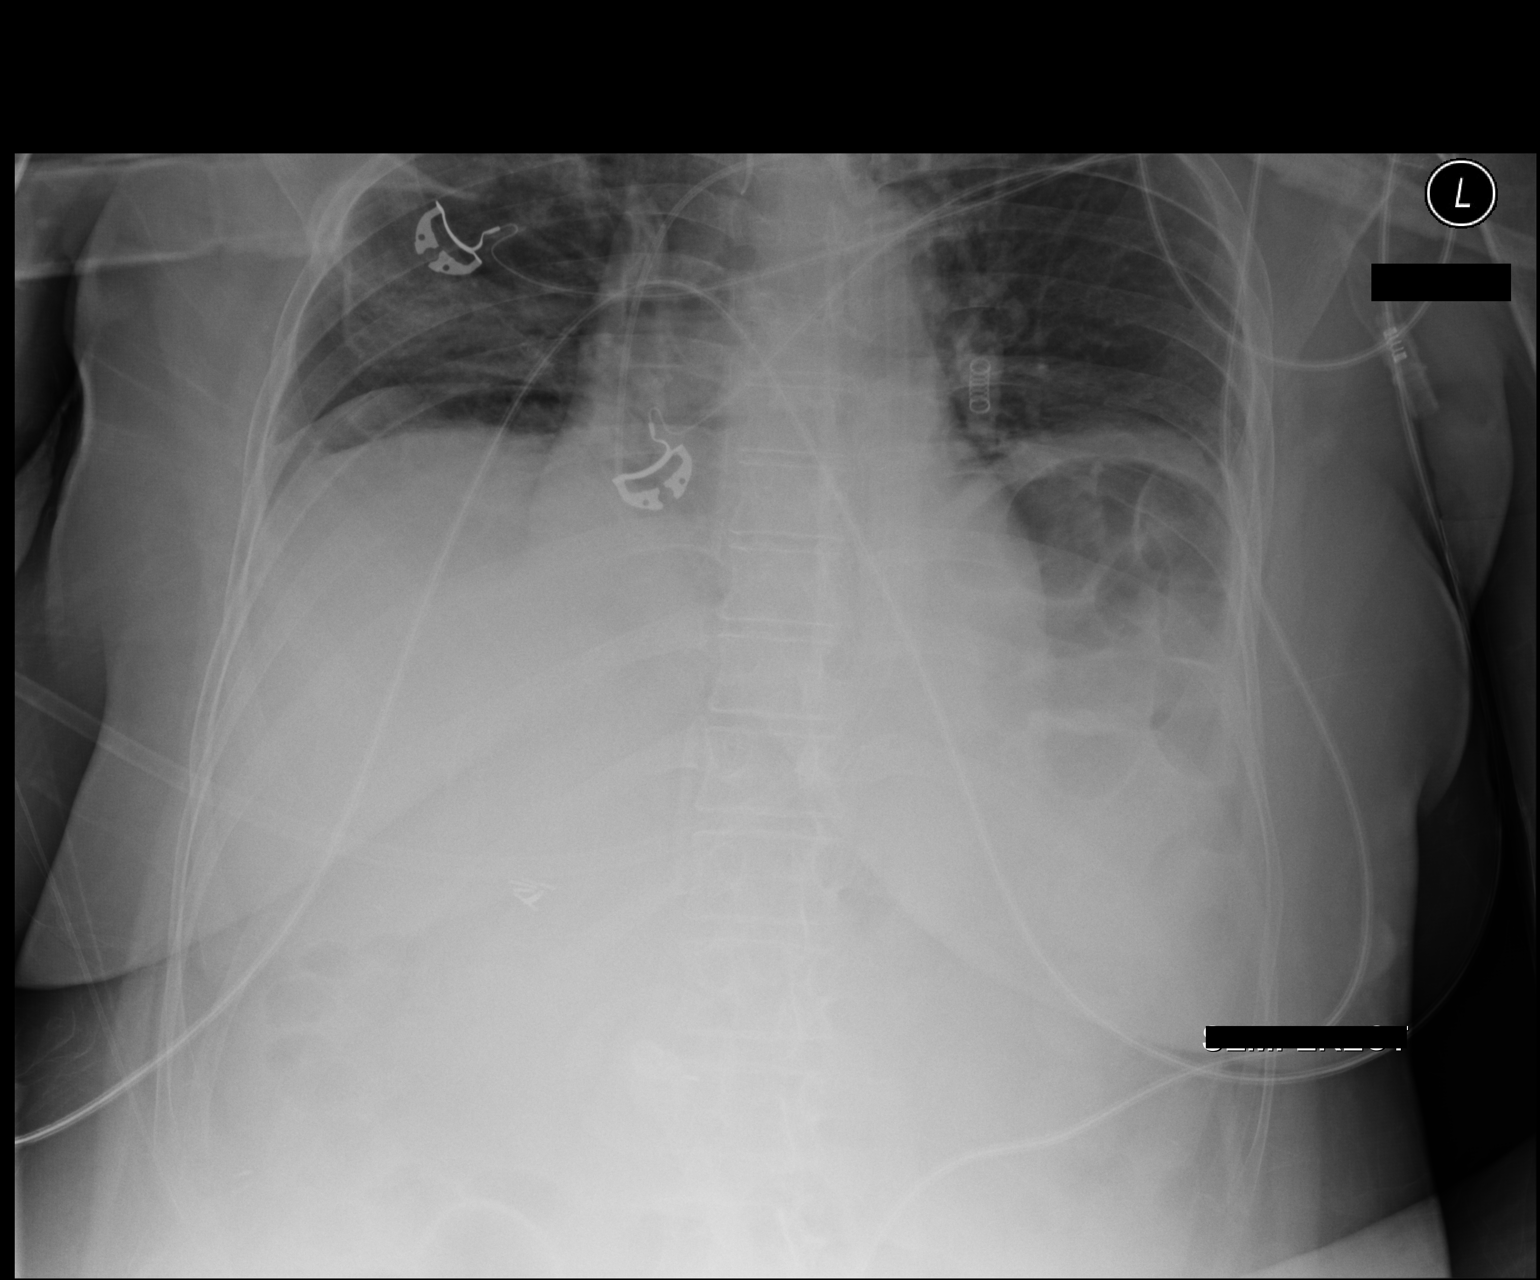

[1 of 1 positions shown; findings below may reference images not displayed]

FINDINGS: Endotracheal tube in stable position. Low lung volumes with mild
bibasilar atelectasis. Interim improvement from prior exam. Left
base atelectasis/ infiltrate has cleared. Interim near complete
resolution of small bilateral pleural effusions. No pneumothorax .
IMPRESSION: 1. Lines and tubes in stable position.
2. Low lung volumes with mild bibasilar subsegmental atelectasis.
Interim significant improvement from prior exam. Interim near
complete resolution of small bilateral pleural effusions .

## 2017-10-23 IMAGING — CR DG CHEST 1V PORT
1 series · 1 of 1 positions shown · non-contrast
Comparison: February 29, 2016

CLINICAL DATA: Hypoxia

EXAM:
PORTABLE CHEST 1 VIEW

[AP]
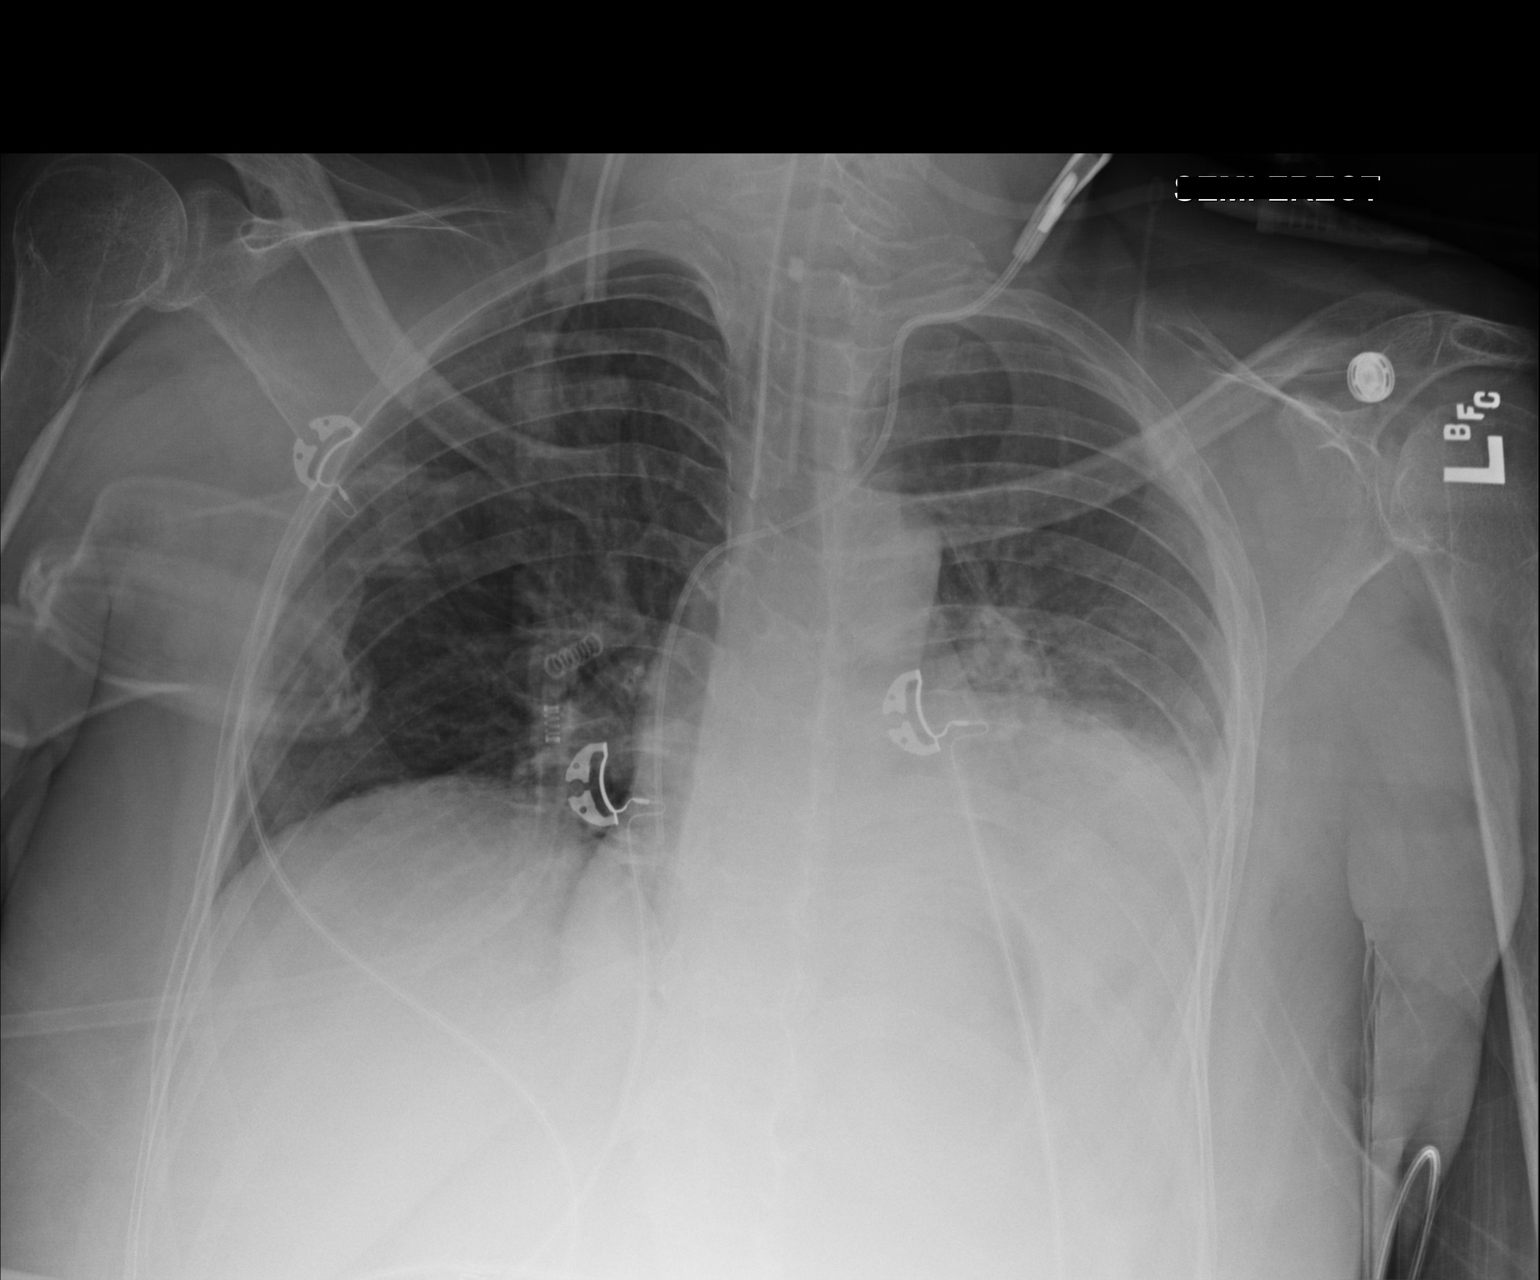

[1 of 1 positions shown; findings below may reference images not displayed]

FINDINGS: Endotracheal tube tip is 3.2 cm above the carina. Central catheter
tip is at the cavoatrial junction. No pneumothorax. There is patchy
consolidation in the left base with small left pleural effusion.
Right lung is clear. Heart is upper normal in size with pulmonary
vascularity within normal limits. No adenopathy. No bone lesions.
IMPRESSION: Tube and catheter positions as described without pneumothorax.
Consolidation left base with small left pleural effusion. Right lung
clear.
